# Patient Record
Sex: Female | Born: 1960
Health system: Southern US, Community
[De-identification: ages and names within clinical notes are randomized; demographics above are authoritative.]

## PROBLEM LIST (undated history)

## (undated) DIAGNOSIS — R7303 Prediabetes: Secondary | ICD-10-CM

## (undated) DIAGNOSIS — R51 Headache: Secondary | ICD-10-CM

## (undated) DIAGNOSIS — M5432 Sciatica, left side: Secondary | ICD-10-CM

## (undated) DIAGNOSIS — T7840XA Allergy, unspecified, initial encounter: Secondary | ICD-10-CM

## (undated) DIAGNOSIS — G709 Myoneural disorder, unspecified: Secondary | ICD-10-CM

## (undated) DIAGNOSIS — M7150 Other bursitis, not elsewhere classified, unspecified site: Secondary | ICD-10-CM

## (undated) DIAGNOSIS — M1990 Unspecified osteoarthritis, unspecified site: Secondary | ICD-10-CM

## (undated) DIAGNOSIS — L309 Dermatitis, unspecified: Secondary | ICD-10-CM

## (undated) DIAGNOSIS — I10 Essential (primary) hypertension: Secondary | ICD-10-CM

## (undated) HISTORY — DX: Allergy, unspecified, initial encounter: T78.40XA

## (undated) HISTORY — DX: Dermatitis, unspecified: L30.9

## (undated) HISTORY — DX: Other bursitis, not elsewhere classified, unspecified site: M71.50

## (undated) HISTORY — DX: Unspecified osteoarthritis, unspecified site: M19.90

## (undated) HISTORY — DX: Headache: R51

## (undated) HISTORY — DX: Myoneural disorder, unspecified: G70.9

## (undated) HISTORY — PX: TUBAL LIGATION: SHX77

## (undated) HISTORY — PX: ABDOMINAL HYSTERECTOMY: SHX81

## (undated) HISTORY — DX: Sciatica, left side: M54.32

---

## 2001-02-19 ENCOUNTER — Other Ambulatory Visit: Admission: RE | Admit: 2001-02-19 | Discharge: 2001-02-19 | Payer: Self-pay | Admitting: Obstetrics and Gynecology

## 2001-02-28 ENCOUNTER — Ambulatory Visit (HOSPITAL_COMMUNITY): Admission: RE | Admit: 2001-02-28 | Discharge: 2001-02-28 | Payer: Self-pay | Admitting: Obstetrics and Gynecology

## 2001-12-11 ENCOUNTER — Emergency Department (HOSPITAL_COMMUNITY): Admission: EM | Admit: 2001-12-11 | Discharge: 2001-12-11 | Payer: Self-pay | Admitting: Emergency Medicine

## 2001-12-11 ENCOUNTER — Encounter: Payer: Self-pay | Admitting: Emergency Medicine

## 2001-12-23 ENCOUNTER — Emergency Department (HOSPITAL_COMMUNITY): Admission: EM | Admit: 2001-12-23 | Discharge: 2001-12-23 | Payer: Self-pay | Admitting: Emergency Medicine

## 2002-10-02 ENCOUNTER — Encounter: Payer: Self-pay | Admitting: *Deleted

## 2002-10-02 ENCOUNTER — Emergency Department (HOSPITAL_COMMUNITY): Admission: EM | Admit: 2002-10-02 | Discharge: 2002-10-02 | Payer: Self-pay | Admitting: *Deleted

## 2003-11-09 ENCOUNTER — Emergency Department (HOSPITAL_COMMUNITY): Admission: EM | Admit: 2003-11-09 | Discharge: 2003-11-09 | Payer: Self-pay | Admitting: *Deleted

## 2004-02-12 ENCOUNTER — Emergency Department (HOSPITAL_COMMUNITY): Admission: EM | Admit: 2004-02-12 | Discharge: 2004-02-12 | Payer: Self-pay | Admitting: Emergency Medicine

## 2004-03-19 ENCOUNTER — Emergency Department (HOSPITAL_COMMUNITY): Admission: EM | Admit: 2004-03-19 | Discharge: 2004-03-19 | Payer: Self-pay | Admitting: Emergency Medicine

## 2005-01-01 ENCOUNTER — Emergency Department (HOSPITAL_COMMUNITY): Admission: EM | Admit: 2005-01-01 | Discharge: 2005-01-01 | Payer: Self-pay | Admitting: Emergency Medicine

## 2006-08-12 ENCOUNTER — Emergency Department (HOSPITAL_COMMUNITY): Admission: EM | Admit: 2006-08-12 | Discharge: 2006-08-12 | Payer: Self-pay | Admitting: Emergency Medicine

## 2006-12-14 ENCOUNTER — Ambulatory Visit (HOSPITAL_COMMUNITY): Admission: RE | Admit: 2006-12-14 | Discharge: 2006-12-14 | Payer: Self-pay | Admitting: Family Medicine

## 2008-11-03 ENCOUNTER — Ambulatory Visit: Payer: Self-pay | Admitting: Orthopedic Surgery

## 2008-11-03 DIAGNOSIS — M7150 Other bursitis, not elsewhere classified, unspecified site: Secondary | ICD-10-CM

## 2008-11-03 HISTORY — DX: Other bursitis, not elsewhere classified, unspecified site: M71.50

## 2008-11-05 ENCOUNTER — Encounter: Payer: Self-pay | Admitting: Orthopedic Surgery

## 2009-05-05 ENCOUNTER — Emergency Department (HOSPITAL_COMMUNITY): Admission: EM | Admit: 2009-05-05 | Discharge: 2009-05-05 | Payer: Self-pay | Admitting: Emergency Medicine

## 2010-06-20 ENCOUNTER — Emergency Department (HOSPITAL_COMMUNITY): Admission: EM | Admit: 2010-06-20 | Discharge: 2010-06-20 | Payer: Self-pay | Admitting: Emergency Medicine

## 2010-06-22 ENCOUNTER — Emergency Department (HOSPITAL_COMMUNITY)
Admission: EM | Admit: 2010-06-22 | Discharge: 2010-06-22 | Payer: Self-pay | Source: Home / Self Care | Admitting: Emergency Medicine

## 2010-09-10 ENCOUNTER — Emergency Department (HOSPITAL_COMMUNITY)
Admission: EM | Admit: 2010-09-10 | Discharge: 2010-09-11 | Disposition: A | Discharge status: Home / Self Care | Payer: BC Managed Care – PPO | Attending: Emergency Medicine | Admitting: Emergency Medicine

## 2010-09-10 DIAGNOSIS — R42 Dizziness and giddiness: Secondary | ICD-10-CM | POA: Insufficient documentation

## 2010-09-10 DIAGNOSIS — R059 Cough, unspecified: Secondary | ICD-10-CM | POA: Insufficient documentation

## 2010-09-10 DIAGNOSIS — R071 Chest pain on breathing: Secondary | ICD-10-CM | POA: Insufficient documentation

## 2010-09-10 DIAGNOSIS — R05 Cough: Secondary | ICD-10-CM | POA: Insufficient documentation

## 2010-09-11 ENCOUNTER — Emergency Department (HOSPITAL_COMMUNITY): Payer: BC Managed Care – PPO

## 2010-10-20 LAB — CBC
Hemoglobin: 14.3 g/dL (ref 12.0–15.0)
MCHC: 33.7 g/dL (ref 30.0–36.0)
MCV: 85.8 fL (ref 78.0–100.0)
RBC: 4.93 MIL/uL (ref 3.87–5.11)
WBC: 10.1 10*3/uL (ref 4.0–10.5)

## 2010-10-20 LAB — URINALYSIS, ROUTINE W REFLEX MICROSCOPIC
Bilirubin Urine: NEGATIVE
Glucose, UA: NEGATIVE mg/dL
Hgb urine dipstick: NEGATIVE
Ketones, ur: NEGATIVE mg/dL
Protein, ur: NEGATIVE mg/dL
Urobilinogen, UA: 0.2 mg/dL (ref 0.0–1.0)

## 2010-10-20 LAB — BASIC METABOLIC PANEL
CO2: 26 mEq/L (ref 19–32)
Calcium: 9.9 mg/dL (ref 8.4–10.5)
Glucose, Bld: 96 mg/dL (ref 70–99)
Potassium: 4 mEq/L (ref 3.5–5.1)
Sodium: 138 mEq/L (ref 135–145)

## 2010-10-20 LAB — DIFFERENTIAL
Eosinophils Absolute: 0.4 10*3/uL (ref 0.0–0.7)
Lymphs Abs: 4 10*3/uL (ref 0.7–4.0)
Monocytes Absolute: 0.8 10*3/uL (ref 0.1–1.0)
Monocytes Relative: 8 % (ref 3–12)
Neutro Abs: 4.9 10*3/uL (ref 1.7–7.7)
Neutrophils Relative %: 48 % (ref 43–77)

## 2010-10-20 LAB — URINE CULTURE: Colony Count: 75000

## 2010-10-20 LAB — GLUCOSE, CAPILLARY: Glucose-Capillary: 84 mg/dL (ref 70–99)

## 2010-10-20 LAB — URINE MICROSCOPIC-ADD ON

## 2010-10-24 ENCOUNTER — Other Ambulatory Visit (HOSPITAL_COMMUNITY): Payer: Self-pay | Admitting: Family Medicine

## 2010-10-24 DIAGNOSIS — Z139 Encounter for screening, unspecified: Secondary | ICD-10-CM

## 2010-12-02 NOTE — Op Note (Signed)
[facility]  Patient:    Robin Sherman, Robin Sherman. Visit Number: [id number] MRN: [id number]          Service Type: Attending:  Christin Bach, M.D. Dictated by:   Christin Bach, M.D. Proc. Date: 02/28/01                             Operative Report  PREOPERATIVE DIAGNOSES:  Elective sterilization.  POSTOPERATIVE DIAGNOSES:  Elective sterilization.  PROCEDURE:  Laparoscopic tubal sterilization with Falope rings.  SURGEON:  Christin Bach, M.D.  ASSISTANTAmie Critchley, CST  ANESTHESIA:  General.  COMPLICATIONS:  None.  ESTIMATED BLOOD LOSS: Minimal.  FINDINGS:  Nabothian cyst on the anterior lip of the cervix.  Normal pelvic anatomy.  The Nabothian cyst was sufficiently deforming the cervix and a sponge stick was used to manipulate the uterus rather than a Hulka tenaculum.  INDICATION:  Elective permanent sterilization.  DETAILS OF PROCEDURE:  The patient was taken to the operating room, prepped and draped for a combined abdominal and vaginal procedure, with a sponge stick for uterine manipulation. Bladder in-and-out catheterization. An infraumbilical, 1 cm vertical incision, as well as a transverse suprapubic 1 cm incision. Veress needle was used to introduce pneumoperitoneum through the umbilical incision with the pneumoperitoneum easily introduced under 10 mmHg of pressure. Introduction of the Veress needle was done, carefully elevating the abdominal wall and orienting the needle toward the pelvis.  The laparoscopic trocar was then carefully introduced into the abdomen using a similar technique, and the laparoscope was used to visualize normal pelvic anatomy with no evidence of bleeding or trauma. The suprapubic trocar was  introduced under direct visualization, and then attention was directed to the left fallopian tube, which was identified up to its fimbriated end, elevated and a mid-segment loop of the tube was drawn up into the Falope ring  applier, Marcaine 0.25% applied to the surface of the tube and the Falope ring applied, inspected, and found to be in satisfactory position. The opposite tube was then treated in a similar fashion. The mesosalpinx beneath the Falope ring on each side was then infiltrated with approximately 3 cc of Marcaine 0.25%, using a transabdominal approach with a 22-gauge spinal needle.  Then, the laparoscopic equipment was removed after instilling 200 cc of saline into the abdomen and deflating the abdomen. Subcuticular 4-0 Dexon was used to close the skin incisions and Steri-Strips was placed on the skin surface. Sponge and needle counts were correct. The patient tolerated the procedure well, was awakened, and went to the recovery room in good condition. Dictated by:   Christin Bach, M.D. Attending:  Christin Bach, M.D. DD:  06/02/01 TD:  06/02/01 Job: [id number]

## 2010-12-19 ENCOUNTER — Ambulatory Visit (HOSPITAL_COMMUNITY): Payer: BC Managed Care – PPO

## 2011-01-06 ENCOUNTER — Emergency Department (HOSPITAL_COMMUNITY)
Admission: EM | Admit: 2011-01-06 | Discharge: 2011-01-06 | Disposition: A | Discharge status: Home / Self Care | Payer: BC Managed Care – PPO | Attending: Emergency Medicine | Admitting: Emergency Medicine

## 2011-01-06 DIAGNOSIS — Z79899 Other long term (current) drug therapy: Secondary | ICD-10-CM | POA: Insufficient documentation

## 2011-01-06 DIAGNOSIS — R42 Dizziness and giddiness: Secondary | ICD-10-CM | POA: Insufficient documentation

## 2011-01-06 DIAGNOSIS — N390 Urinary tract infection, site not specified: Secondary | ICD-10-CM | POA: Insufficient documentation

## 2011-01-06 DIAGNOSIS — I10 Essential (primary) hypertension: Secondary | ICD-10-CM | POA: Insufficient documentation

## 2011-01-06 LAB — DIFFERENTIAL
Eosinophils Relative: 6 % — ABNORMAL HIGH (ref 0–5)
Lymphocytes Relative: 33 % (ref 12–46)
Lymphs Abs: 2.9 10*3/uL (ref 0.7–4.0)
Monocytes Absolute: 0.7 10*3/uL (ref 0.1–1.0)
Monocytes Relative: 8 % (ref 3–12)

## 2011-01-06 LAB — COMPREHENSIVE METABOLIC PANEL
Albumin: 3.9 g/dL (ref 3.5–5.2)
BUN: 10 mg/dL (ref 6–23)
Calcium: 10.1 mg/dL (ref 8.4–10.5)
Chloride: 102 mEq/L (ref 96–112)
Creatinine, Ser: 0.69 mg/dL (ref 0.50–1.10)
Total Bilirubin: 0.3 mg/dL (ref 0.3–1.2)
Total Protein: 8.5 g/dL — ABNORMAL HIGH (ref 6.0–8.3)

## 2011-01-06 LAB — URINALYSIS, ROUTINE W REFLEX MICROSCOPIC
Bilirubin Urine: NEGATIVE
Ketones, ur: NEGATIVE mg/dL
Leukocytes, UA: NEGATIVE
Nitrite: POSITIVE — AB
Urobilinogen, UA: 0.2 mg/dL (ref 0.0–1.0)

## 2011-01-06 LAB — CBC
HCT: 40.4 % (ref 36.0–46.0)
MCH: 28.2 pg (ref 26.0–34.0)
MCHC: 32.9 g/dL (ref 30.0–36.0)
MCV: 85.6 fL (ref 78.0–100.0)
RDW: 14.4 % (ref 11.5–15.5)

## 2011-01-06 LAB — LIPASE, BLOOD: Lipase: 55 U/L (ref 11–59)

## 2011-01-09 LAB — URINE CULTURE: Colony Count: >=100000

## 2011-01-11 ENCOUNTER — Emergency Department (HOSPITAL_COMMUNITY)
Admission: EM | Admit: 2011-01-11 | Discharge: 2011-01-11 | Disposition: A | Discharge status: Home / Self Care | Payer: BC Managed Care – PPO | Attending: Emergency Medicine | Admitting: Emergency Medicine

## 2011-01-11 ENCOUNTER — Emergency Department (HOSPITAL_COMMUNITY): Payer: BC Managed Care – PPO

## 2011-01-11 DIAGNOSIS — I10 Essential (primary) hypertension: Secondary | ICD-10-CM | POA: Insufficient documentation

## 2011-01-11 DIAGNOSIS — N390 Urinary tract infection, site not specified: Secondary | ICD-10-CM | POA: Insufficient documentation

## 2011-01-11 DIAGNOSIS — M549 Dorsalgia, unspecified: Secondary | ICD-10-CM | POA: Insufficient documentation

## 2011-01-11 LAB — URINALYSIS, ROUTINE W REFLEX MICROSCOPIC
Glucose, UA: NEGATIVE mg/dL
Ketones, ur: NEGATIVE mg/dL
Protein, ur: NEGATIVE mg/dL

## 2011-01-11 LAB — DIFFERENTIAL
Lymphocytes Relative: 35 % (ref 12–46)
Monocytes Absolute: 0.5 10*3/uL (ref 0.1–1.0)
Monocytes Relative: 6 % (ref 3–12)
Neutro Abs: 4.3 10*3/uL (ref 1.7–7.7)

## 2011-01-11 LAB — CBC
HCT: 39.8 % (ref 36.0–46.0)
Hemoglobin: 12.9 g/dL (ref 12.0–15.0)
MCH: 28.1 pg (ref 26.0–34.0)
MCHC: 32.4 g/dL (ref 30.0–36.0)
MCV: 86.7 fL (ref 78.0–100.0)

## 2011-01-11 LAB — BASIC METABOLIC PANEL
CO2: 23 mEq/L (ref 19–32)
Calcium: 8.9 mg/dL (ref 8.4–10.5)
Glucose, Bld: 109 mg/dL — ABNORMAL HIGH (ref 70–99)
Potassium: 4 mEq/L (ref 3.5–5.1)
Sodium: 137 mEq/L (ref 135–145)

## 2011-03-18 ENCOUNTER — Encounter: Payer: Self-pay | Admitting: *Deleted

## 2011-03-18 ENCOUNTER — Emergency Department (HOSPITAL_COMMUNITY)
Admission: EM | Admit: 2011-03-18 | Discharge: 2011-03-18 | Disposition: A | Discharge status: Home / Self Care | Payer: BC Managed Care – PPO | Attending: Emergency Medicine | Admitting: Emergency Medicine

## 2011-03-18 ENCOUNTER — Emergency Department (HOSPITAL_COMMUNITY): Payer: BC Managed Care – PPO

## 2011-03-18 DIAGNOSIS — I10 Essential (primary) hypertension: Secondary | ICD-10-CM | POA: Insufficient documentation

## 2011-03-18 DIAGNOSIS — F17200 Nicotine dependence, unspecified, uncomplicated: Secondary | ICD-10-CM | POA: Insufficient documentation

## 2011-03-18 DIAGNOSIS — H8110 Benign paroxysmal vertigo, unspecified ear: Secondary | ICD-10-CM

## 2011-03-18 HISTORY — DX: Essential (primary) hypertension: I10

## 2011-03-18 LAB — URINALYSIS, ROUTINE W REFLEX MICROSCOPIC
Bilirubin Urine: NEGATIVE
Glucose, UA: NEGATIVE mg/dL
Hgb urine dipstick: NEGATIVE
Specific Gravity, Urine: 1.025 (ref 1.005–1.030)
Urobilinogen, UA: 0.2 mg/dL (ref 0.0–1.0)

## 2011-03-18 LAB — CBC
HCT: 37.7 % (ref 36.0–46.0)
Hemoglobin: 12.2 g/dL (ref 12.0–15.0)
MCH: 27.8 pg (ref 26.0–34.0)
MCHC: 32.4 g/dL (ref 30.0–36.0)

## 2011-03-18 LAB — DIFFERENTIAL
Basophils Relative: 0 % (ref 0–1)
Lymphs Abs: 2.5 10*3/uL (ref 0.7–4.0)
Monocytes Absolute: 0.7 10*3/uL (ref 0.1–1.0)
Monocytes Relative: 8 % (ref 3–12)
Neutro Abs: 5.1 10*3/uL (ref 1.7–7.7)

## 2011-03-18 LAB — BASIC METABOLIC PANEL
BUN: 14 mg/dL (ref 6–23)
Chloride: 102 mEq/L (ref 96–112)
GFR calc Af Amer: >60 mL/min (ref >60)
Glucose, Bld: 104 mg/dL — ABNORMAL HIGH (ref 70–99)
Potassium: 4.4 mEq/L (ref 3.5–5.1)

## 2011-03-18 LAB — GLUCOSE, CAPILLARY: Glucose-Capillary: 111 mg/dL — ABNORMAL HIGH (ref 70–99)

## 2011-03-18 MED ORDER — MECLIZINE HCL 25 MG PO TABS: 25.0000 mg | ORAL_TABLET | Freq: Three times a day (TID) | ORAL | Status: AC | PRN

## 2011-03-18 MED ORDER — ALPRAZOLAM 0.25 MG PO TABS
0.2500 mg | ORAL_TABLET | Freq: Three times a day (TID) | ORAL | Status: AC | PRN
Start: 2011-03-18 — End: 2011-04-17

## 2011-03-18 MED ORDER — MECLIZINE HCL 12.5 MG PO TABS
25.0000 mg | ORAL_TABLET | Freq: Once | ORAL | Status: AC
  Administered 2011-03-18: 25 mg via ORAL
  Filled 2011-03-18: qty 1

## 2011-03-18 NOTE — ED Provider Notes (Signed)
History     CSN: [id number] Arrival date & time: 03/18/2011 11:17 AM  Chief Complaint  Patient presents with  . Blurred Vision  . Dizziness   Patient is a 50 y.o. female presenting with neurologic complaint. The history is provided by the patient.  Neurologic Problem The primary symptoms include dizziness and nausea. Primary symptoms do not include headaches, fever or vomiting. The symptoms began 12 to 24 hours ago. The symptoms are unchanged. Context: Triggered by positional changes and moving her head.  She describes the dizziness as a sensation of spinning. The dizziness began yesterday. The dizziness has been unchanged since its onset. Dizziness also occurs with nausea. Dizziness does not occur with blurred vision, tinnitus, hearing loss, vomiting, weakness or diaphoresis.  Additional symptoms include vertigo. Additional symptoms do not include neck stiffness, weakness, pain or tinnitus.    Past Medical History  Diagnosis Date  . Hypertension     History reviewed. No pertinent past surgical history.  No family history on file.  History  Substance Use Topics  . Smoking status: Current Everyday Smoker -- 0.5 packs/day  . Smokeless tobacco: Not on file  . Alcohol Use: No    OB History    Grav Para Term Preterm Abortions TAB SAB Ect Mult Living                  Review of Systems  Constitutional: Negative for fever and diaphoresis.  HENT: Negative for congestion, sore throat, rhinorrhea, neck pain, neck stiffness and tinnitus.   Eyes: Negative.  Negative for blurred vision.  Respiratory: Negative for chest tightness and shortness of breath.   Cardiovascular: Negative for chest pain.  Gastrointestinal: Positive for nausea. Negative for vomiting and abdominal pain.  Genitourinary: Negative.   Musculoskeletal: Negative for joint swelling and arthralgias.  Skin: Negative.  Negative for rash and wound.  Neurological: Positive for dizziness, vertigo and light-headedness.  Negative for weakness, numbness and headaches.  Hematological: Negative.   Psychiatric/Behavioral: Negative.     Physical Exam  BP 125/89  Pulse 60  Temp(Src) 98.4 F (36.9 C) (Oral)  Resp 20  Ht 5\' 4"  (1.626 m)  Wt 215 lb (97.523 kg)  BMI 36.90 kg/m2  SpO2 99%  LMP 01/30/2011  Physical Exam  Nursing note and vitals reviewed. Constitutional: She is oriented to person, place, and time. She appears well-developed and well-nourished.  HENT:  Head: Normocephalic and atraumatic.  Eyes: Conjunctivae are normal.  Neck: Normal range of motion.  Cardiovascular: Normal rate, regular rhythm, normal heart sounds and intact distal pulses.   Pulmonary/Chest: Effort normal and breath sounds normal. She has no wheezes.  Abdominal: Soft. Bowel sounds are normal. There is no tenderness.  Musculoskeletal: Normal range of motion.  Neurological: She is alert and oriented to person, place, and time. No cranial nerve deficit. She displays a negative Romberg sign. Coordination normal.       Spinning worsened with positional changes and with head movements while supine.  Skin: Skin is warm and dry.  Psychiatric: She has a normal mood and affect.    ED Course  Procedures  MDM Normal labs and head CT.  Patient did obtain moderate improvement of sx with meclizine medication.      Candis Musa, PA 03/18/11 1655

## 2011-03-18 NOTE — ED Notes (Signed)
Pt c/o blurred vision and dizziness. Pt states this began last pm while at home.

## 2011-03-21 NOTE — ED Provider Notes (Signed)
Medical screening examination/treatment/procedure(s) were performed by non-physician practitioner and as supervising physician I was immediately available for consultation/collaboration.   Benny Lennert, MD 03/21/11 1315

## 2011-07-27 ENCOUNTER — Emergency Department (HOSPITAL_COMMUNITY)
Admission: EM | Admit: 2011-07-27 | Discharge: 2011-07-27 | Disposition: A | Discharge status: Home / Self Care | Payer: BC Managed Care – PPO | Attending: Emergency Medicine | Admitting: Emergency Medicine

## 2011-07-27 ENCOUNTER — Encounter (HOSPITAL_COMMUNITY): Payer: Self-pay | Admitting: Emergency Medicine

## 2011-07-27 ENCOUNTER — Emergency Department (HOSPITAL_COMMUNITY): Payer: BC Managed Care – PPO

## 2011-07-27 DIAGNOSIS — R6889 Other general symptoms and signs: Secondary | ICD-10-CM | POA: Insufficient documentation

## 2011-07-27 DIAGNOSIS — J3489 Other specified disorders of nose and nasal sinuses: Secondary | ICD-10-CM | POA: Insufficient documentation

## 2011-07-27 DIAGNOSIS — J45909 Unspecified asthma, uncomplicated: Secondary | ICD-10-CM | POA: Insufficient documentation

## 2011-07-27 DIAGNOSIS — R059 Cough, unspecified: Secondary | ICD-10-CM | POA: Insufficient documentation

## 2011-07-27 DIAGNOSIS — F17200 Nicotine dependence, unspecified, uncomplicated: Secondary | ICD-10-CM | POA: Insufficient documentation

## 2011-07-27 DIAGNOSIS — IMO0001 Reserved for inherently not codable concepts without codable children: Secondary | ICD-10-CM | POA: Insufficient documentation

## 2011-07-27 DIAGNOSIS — I10 Essential (primary) hypertension: Secondary | ICD-10-CM | POA: Insufficient documentation

## 2011-07-27 DIAGNOSIS — R05 Cough: Secondary | ICD-10-CM | POA: Insufficient documentation

## 2011-07-27 MED ORDER — ALBUTEROL SULFATE HFA 108 (90 BASE) MCG/ACT IN AERS
2.0000 | INHALATION_SPRAY | Freq: Once | RESPIRATORY_TRACT | Status: AC
  Administered 2011-07-27: 2 via RESPIRATORY_TRACT
  Filled 2011-07-27: qty 6.7

## 2011-07-27 MED ORDER — AZITHROMYCIN 250 MG PO TABS: ORAL_TABLET | ORAL | Status: DC

## 2011-07-27 MED ORDER — HYDROCOD POLST-CHLORPHEN POLST 10-8 MG/5ML PO LQCR: 5.0000 mL | Freq: Two times a day (BID) | ORAL | Status: DC | PRN

## 2011-07-27 NOTE — ED Notes (Signed)
MD at bedside. 

## 2011-07-27 NOTE — ED Provider Notes (Signed)
History     CSN: [id number]  Arrival date & time 07/27/11  1355   First MD Initiated Contact with Patient 07/27/11 1636      Chief Complaint  Patient presents with  . Cough  . Nasal Congestion  . Generalized Body Aches    (Consider location/radiation/quality/duration/timing/severity/associated sxs/prior treatment) Patient is a 51 y.o. female presenting with cough. The history is provided by the patient. No language interpreter was used.  Cough This is a new problem. The current episode started 2 days ago. The problem occurs constantly. The problem has not changed since onset.The cough is productive of sputum. There has been no fever. Associated symptoms include rhinorrhea, myalgias and wheezing. Pertinent negatives include no chest pain, no chills, no ear pain and no sore throat. She has tried nothing for the symptoms. The treatment provided no relief. She is a smoker. Her past medical history is significant for asthma. Her past medical history does not include bronchitis or pneumonia.    Past Medical History  Diagnosis Date  . Hypertension   . Asthma     History reviewed. No pertinent past surgical history.  History reviewed. No pertinent family history.  History  Substance Use Topics  . Smoking status: Current Everyday Smoker -- 0.5 packs/day  . Smokeless tobacco: Not on file  . Alcohol Use: No    OB History    Grav Para Term Preterm Abortions TAB SAB Ect Mult Living                  Review of Systems  Constitutional: Negative for fever, chills, activity change and appetite change.  HENT: Positive for congestion, rhinorrhea and sneezing. Negative for ear pain, sore throat, facial swelling, neck pain and neck stiffness.   Respiratory: Positive for cough and wheezing. Negative for chest tightness.   Cardiovascular: Negative for chest pain.  Gastrointestinal: Negative for vomiting and abdominal pain.  Genitourinary: Negative for dysuria and difficulty urinating.    Musculoskeletal: Positive for myalgias. Negative for arthralgias.  Neurological: Negative for weakness and numbness.  Hematological: Negative for adenopathy.  All other systems reviewed and are negative.    Allergies  Codeine and Tylenol  Home Medications   Current Outpatient Rx  Name Route Sig Dispense Refill  . HALOBETASOL PROPIONATE 0.05 % EX CREA Topical Apply 1 application topically 2 (two) times daily as needed. For rash     . HYDROXYZINE PAMOATE 25 MG PO CAPS Oral Take 25 mg by mouth 3 (three) times daily as needed. For itching     . LISINOPRIL-HYDROCHLOROTHIAZIDE 20-12.5 MG PO TABS Oral Take 1 tablet by mouth daily.        BP 130/86  Pulse 96  Temp 98.8 F (37.1 C)  Resp 20  Ht 5\' 3"  (1.6 m)  Wt 200 lb (90.719 kg)  BMI 35.43 kg/m2  SpO2 97%  Physical Exam  Nursing note and vitals reviewed. Constitutional: She is oriented to person, place, and time. She appears well-developed and well-nourished. No distress.  HENT:  Head: Normocephalic and atraumatic.  Mouth/Throat: Oropharynx is clear and moist.  Neck: Normal range of motion.  Cardiovascular: Normal rate, regular rhythm and normal heart sounds.   Pulmonary/Chest: Effort normal. No respiratory distress. She has wheezes. She has no rales. She exhibits no tenderness.  Musculoskeletal: She exhibits no edema and no tenderness.  Lymphadenopathy:    She has no cervical adenopathy.  Neurological: She is alert and oriented to person, place, and time.  Skin: Skin is warm and  dry.    ED Course  Procedures (including critical care time)  Labs Reviewed - No data to display Dg Chest 2 View  07/27/2011  *RADIOLOGY REPORT*  Clinical Data: Cough.  CHEST - 2 VIEW  Comparison: Chest x-ray 09/11/2010.  Findings: The cardiac silhouette, mediastinal and hilar contours are within normal limits and stable.  The lungs are clear of infiltrate or effusion.  Peribronchial thickening could be related to smoking for bronchitis.  No  pleural effusion.  The bony thorax is intact.  IMPRESSION: Bronchitic lung changes may be related to smoking or bronchitis. No infiltrates.  Original Report Authenticated By: P. Loralie Champagne, M.D.        MDM    Patient is alert, NAD.  Few expir and inspir wheezes.  No rales.  No hypoxia or tachypnea.  Patient appears stable for d/c. Vitals stable. Agrees to follow-up with her PMD if needed or return here  Patient / Family / Caregiver understand and agree with initial ED impression and plan with expectations set for ED visit.   Pt stable in ED with no significant deterioration in condition.       Braxdon Gappa L. [place], [place] 07/29/11 [phone]

## 2011-07-27 NOTE — ED Notes (Signed)
Pt c/o cough/congestion/body aches x 2 days.  

## 2011-07-30 NOTE — ED Provider Notes (Signed)
Medical screening examination/treatment/procedure(s) were performed by non-physician practitioner and as supervising physician I was immediately available for consultation/collaboration.   Joya Gaskins, MD 07/30/11 [phone]

## 2011-08-24 ENCOUNTER — Emergency Department (HOSPITAL_COMMUNITY)
Admission: EM | Admit: 2011-08-24 | Discharge: 2011-08-24 | Disposition: A | Discharge status: Home / Self Care | Payer: BC Managed Care – PPO | Attending: Emergency Medicine | Admitting: Emergency Medicine

## 2011-08-24 ENCOUNTER — Encounter (HOSPITAL_COMMUNITY): Payer: Self-pay

## 2011-08-24 DIAGNOSIS — J111 Influenza due to unidentified influenza virus with other respiratory manifestations: Secondary | ICD-10-CM | POA: Insufficient documentation

## 2011-08-24 DIAGNOSIS — I10 Essential (primary) hypertension: Secondary | ICD-10-CM | POA: Insufficient documentation

## 2011-08-24 DIAGNOSIS — F17200 Nicotine dependence, unspecified, uncomplicated: Secondary | ICD-10-CM | POA: Insufficient documentation

## 2011-08-24 DIAGNOSIS — J45909 Unspecified asthma, uncomplicated: Secondary | ICD-10-CM | POA: Insufficient documentation

## 2011-08-24 MED ORDER — PSEUDOEPHEDRINE HCL 60 MG PO TABS: ORAL_TABLET | ORAL | Status: DC

## 2011-08-24 MED ORDER — HYDROCODONE-IBUPROFEN 7.5-200 MG PO TABS: 1.0000 | ORAL_TABLET | Freq: Four times a day (QID) | ORAL | Status: AC | PRN

## 2011-08-24 NOTE — ED Provider Notes (Signed)
History     CSN: [id number]  Arrival date & time 08/24/11  1205   First MD Initiated Contact with Patient 08/24/11 1248      Chief Complaint  Patient presents with  . Influenza    (Consider location/radiation/quality/duration/timing/severity/associated sxs/prior treatment) Patient is a 51 y.o. female presenting with flu symptoms. The history is provided by the patient.  Influenza This is a new problem. The current episode started in the past 7 days. The problem occurs daily. The problem has been gradually worsening. Associated symptoms include abdominal pain, arthralgias, chills, a fever, headaches and myalgias. Pertinent negatives include no chest pain, coughing or neck pain. The symptoms are aggravated by nothing. She has tried nothing for the symptoms. The treatment provided no relief.    Past Medical History  Diagnosis Date  . Hypertension   . Asthma     History reviewed. No pertinent past surgical history.  No family history on file.  History  Substance Use Topics  . Smoking status: Current Everyday Smoker -- 0.5 packs/day  . Smokeless tobacco: Not on file  . Alcohol Use: No    OB History    Grav Para Term Preterm Abortions TAB SAB Ect Mult Living                  Review of Systems  Constitutional: Positive for fever and chills. Negative for activity change.       All ROS Neg except as noted in HPI  HENT: Negative for nosebleeds and neck pain.   Eyes: Negative for photophobia and discharge.  Respiratory: Negative for cough, shortness of breath and wheezing.   Cardiovascular: Negative for chest pain and palpitations.  Gastrointestinal: Positive for abdominal pain. Negative for blood in stool.  Genitourinary: Negative for dysuria, frequency and hematuria.  Musculoskeletal: Positive for myalgias and arthralgias. Negative for back pain.  Skin: Negative.   Neurological: Positive for headaches. Negative for dizziness, seizures and speech difficulty.    Psychiatric/Behavioral: Negative for hallucinations and confusion.    Allergies  Codeine and Tylenol  Home Medications   Current Outpatient Rx  Name Route Sig Dispense Refill  . AZITHROMYCIN 250 MG PO TABS  Take two tablets on day one, then one tab qd days 2-5 6 tablet 0  . HYDROCOD POLST-CPM POLST ER 10-8 MG/5ML PO LQCR Oral Take 5 mLs by mouth every 12 (twelve) hours as needed. 120 mL 0  . HALOBETASOL PROPIONATE 0.05 % EX CREA Topical Apply 1 application topically 2 (two) times daily as needed. For rash     . HYDROCODONE-IBUPROFEN 7.5-200 MG PO TABS Oral Take 1 tablet by mouth every 6 (six) hours as needed for pain. 20 tablet 0  . HYDROXYZINE PAMOATE 25 MG PO CAPS Oral Take 25 mg by mouth 3 (three) times daily as needed. For itching     . LISINOPRIL-HYDROCHLOROTHIAZIDE 20-12.5 MG PO TABS Oral Take 1 tablet by mouth daily.      Marland Kitchen PSEUDOEPHEDRINE HCL 60 MG PO TABS  1 po tid for congestion. 21 tablet 0    BP 134/98  Pulse 106  Temp(Src) 98.5 F (36.9 C) (Oral)  Resp 20  Ht 5\' 4"  (1.626 m)  Wt 210 lb (95.255 kg)  BMI 36.05 kg/m2  SpO2 100%  LMP 08/17/2011  Physical Exam  Nursing note and vitals reviewed. Constitutional: She is oriented to person, place, and time. She appears well-developed and well-nourished.  Non-toxic appearance.  HENT:  Head: Normocephalic.  Right Ear: Tympanic membrane and external ear  normal.  Left Ear: Tympanic membrane and external ear normal.       Nasal congestion present. Minimal increased redness of the posterior pharynx present. Uvula is in the midline.  Eyes: EOM and lids are normal. Pupils are equal, round, and reactive to light.  Neck: Normal range of motion. Neck supple. Carotid bruit is not present.  Cardiovascular: Normal rate, regular rhythm, normal heart sounds, intact distal pulses and normal pulses.   Pulmonary/Chest: Breath sounds normal. No respiratory distress.  Abdominal: Soft. Bowel sounds are normal. There is no tenderness. There  is no guarding.       Mild upper abd soreness.  Musculoskeletal: Normal range of motion.       Mild to moderate pain, and soreness, to the  lower back with attempted ROM.  Lymphadenopathy:       Head (right side): No submandibular adenopathy present.       Head (left side): No submandibular adenopathy present.    She has no cervical adenopathy.  Neurological: She is alert and oriented to person, place, and time. She has normal strength. No cranial nerve deficit or sensory deficit.  Skin: Skin is warm and dry.  Psychiatric: She has a normal mood and affect. Her speech is normal.    ED Course  Procedures (including critical care time) Pulse oximetry 100% on room air. Within normal limits by my interpretation. Labs Reviewed - No data to display No results found.   1. Influenza       MDM  I have reviewed nursing notes, vital signs, and all appropriate lab and imaging results for this patient.  Rx for Sudafed and vicoprofen given. Pt to f/u with primary MD if not improving.      Kathie Dike, [place] 08/24/11 1301

## 2011-08-24 NOTE — ED Notes (Signed)
Complain of body aches and ha

## 2011-08-24 NOTE — ED Provider Notes (Signed)
Medical screening examination/treatment/procedure(s) were performed by non-physician practitioner and as supervising physician I was immediately available for consultation/collaboration.   Kourtney Montesinos W Lucius Wise, MD 08/24/11 1527 

## 2011-09-21 ENCOUNTER — Ambulatory Visit (INDEPENDENT_AMBULATORY_CARE_PROVIDER_SITE_OTHER): Payer: BC Managed Care – PPO | Admitting: Family Medicine

## 2011-09-21 ENCOUNTER — Encounter: Payer: Self-pay | Admitting: Family Medicine

## 2011-09-21 VITALS — BP 130/88 | HR 92 | Resp 16 | Ht 64.0 in | Wt 215.0 lb

## 2011-09-21 DIAGNOSIS — J4520 Mild intermittent asthma, uncomplicated: Secondary | ICD-10-CM

## 2011-09-21 DIAGNOSIS — F17200 Nicotine dependence, unspecified, uncomplicated: Secondary | ICD-10-CM

## 2011-09-21 DIAGNOSIS — E66812 Obesity, class 2: Secondary | ICD-10-CM | POA: Insufficient documentation

## 2011-09-21 DIAGNOSIS — M25511 Pain in right shoulder: Secondary | ICD-10-CM

## 2011-09-21 DIAGNOSIS — R7309 Other abnormal glucose: Secondary | ICD-10-CM

## 2011-09-21 DIAGNOSIS — J45909 Unspecified asthma, uncomplicated: Secondary | ICD-10-CM

## 2011-09-21 DIAGNOSIS — Z720 Tobacco use: Secondary | ICD-10-CM

## 2011-09-21 DIAGNOSIS — I10 Essential (primary) hypertension: Secondary | ICD-10-CM

## 2011-09-21 DIAGNOSIS — M25519 Pain in unspecified shoulder: Secondary | ICD-10-CM

## 2011-09-21 DIAGNOSIS — E669 Obesity, unspecified: Secondary | ICD-10-CM

## 2011-09-21 MED ORDER — NAPROXEN 500 MG PO TABS: 500.0000 mg | ORAL_TABLET | Freq: Two times a day (BID) | ORAL | Status: DC

## 2011-09-21 MED ORDER — HYDROCHLOROTHIAZIDE 25 MG PO TABS: 25.0000 mg | ORAL_TABLET | Freq: Every day | ORAL | Status: DC
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
## 2011-09-21 NOTE — Assessment & Plan Note (Signed)
D/C ACE secondary to cough, continue HCTZ at 25mg 

## 2011-09-21 NOTE — Assessment & Plan Note (Signed)
Discussed importance of exercise and weight loss

## 2011-09-21 NOTE — Assessment & Plan Note (Signed)
Rotator cuff appears in tact although she has some equivocal impingement signs. Will place on NSAIDS, if not improved will refer to ortho

## 2011-09-21 NOTE — Patient Instructions (Addendum)
Schedule a PAP Smear/CPE  for 4 weeks Get your blood work done - fasting We will review your labs at that visit  I recommend Calcium (1200mg ) and Vit D ( 800IU)  Schedule for Mammogram I recommend that you quit smoking

## 2011-09-21 NOTE — Assessment & Plan Note (Signed)
Obtain A1C. 

## 2011-09-21 NOTE — Assessment & Plan Note (Signed)
Stable

## 2011-09-25 ENCOUNTER — Telehealth: Payer: Self-pay

## 2011-09-25 NOTE — Telephone Encounter (Signed)
She was already taking HCTZ it was in her other blood pressure pill, the new medication is naprosyn for her shoulder. Have her stop taking this and take her Vistaril ( given by her dermatologist) or she can take benadryl. If the blisters do not improve have her come in tomorrow in the morning to have this checked. If she has blistering in her mouth or around her face, of difficulty breathing she needs to go to the ER now!

## 2011-09-25 NOTE — Telephone Encounter (Signed)
Spoke with pt in office and she is aware. The blisters are just on her hands as of now. Pt aware of instructions

## 2011-09-27 LAB — CBC
HCT: 42.2 % (ref 36.0–46.0)
Hemoglobin: 13 g/dL (ref 12.0–15.0)
MCV: 87.4 fL (ref 78.0–100.0)
RBC: 4.83 MIL/uL (ref 3.87–5.11)
WBC: 7.8 10*3/uL (ref 4.0–10.5)

## 2011-09-27 LAB — LIPID PANEL
HDL: 44 mg/dL (ref >39)
Triglycerides: 72 mg/dL (ref <150)

## 2011-09-27 LAB — COMPREHENSIVE METABOLIC PANEL
Albumin: 4.4 g/dL (ref 3.5–5.2)
BUN: 11 mg/dL (ref 6–23)
CO2: 28 mEq/L (ref 19–32)
Calcium: 9.4 mg/dL (ref 8.4–10.5)
Chloride: 102 mEq/L (ref 96–112)
Creat: 0.77 mg/dL (ref 0.50–1.10)
Glucose, Bld: 94 mg/dL (ref 70–99)

## 2011-09-27 LAB — TSH: TSH: 3.727 u[IU]/mL (ref 0.350–4.500)

## 2011-10-19 ENCOUNTER — Other Ambulatory Visit (HOSPITAL_COMMUNITY)
Admission: RE | Admit: 2011-10-19 | Discharge: 2011-10-19 | Disposition: A | Discharge status: Home / Self Care | Payer: BC Managed Care – PPO | Source: Ambulatory Visit | Attending: Family Medicine | Admitting: Family Medicine

## 2011-10-19 DIAGNOSIS — Z01419 Encounter for gynecological examination (general) (routine) without abnormal findings: Secondary | ICD-10-CM | POA: Insufficient documentation

## 2011-10-20 ENCOUNTER — Encounter: Payer: Self-pay | Admitting: Family Medicine

## 2011-10-20 ENCOUNTER — Ambulatory Visit (INDEPENDENT_AMBULATORY_CARE_PROVIDER_SITE_OTHER): Payer: BC Managed Care – PPO | Admitting: Family Medicine

## 2011-10-20 VITALS — BP 130/84 | HR 78 | Resp 15 | Ht 64.0 in | Wt 211.0 lb

## 2011-10-20 DIAGNOSIS — I10 Essential (primary) hypertension: Secondary | ICD-10-CM

## 2011-10-20 DIAGNOSIS — N760 Acute vaginitis: Secondary | ICD-10-CM

## 2011-10-20 DIAGNOSIS — F17200 Nicotine dependence, unspecified, uncomplicated: Secondary | ICD-10-CM

## 2011-10-20 DIAGNOSIS — Z124 Encounter for screening for malignant neoplasm of cervix: Secondary | ICD-10-CM

## 2011-10-20 DIAGNOSIS — Z1211 Encounter for screening for malignant neoplasm of colon: Secondary | ICD-10-CM

## 2011-10-20 DIAGNOSIS — Z23 Encounter for immunization: Secondary | ICD-10-CM

## 2011-10-20 DIAGNOSIS — R21 Rash and other nonspecific skin eruption: Secondary | ICD-10-CM

## 2011-10-20 DIAGNOSIS — Z720 Tobacco use: Secondary | ICD-10-CM

## 2011-10-20 DIAGNOSIS — R7309 Other abnormal glucose: Secondary | ICD-10-CM

## 2011-10-20 DIAGNOSIS — Z01419 Encounter for gynecological examination (general) (routine) without abnormal findings: Secondary | ICD-10-CM

## 2011-10-20 LAB — POC HEMOCCULT BLD/STL (OFFICE/1-CARD/DIAGNOSTIC): Fecal Occult Blood, POC: NEGATIVE

## 2011-10-20 MED ORDER — AMLODIPINE BESYLATE 10 MG PO TABS: 10.0000 mg | ORAL_TABLET | Freq: Every day | ORAL | Status: DC

## 2011-10-20 MED ORDER — HALOBETASOL PROPIONATE 0.05 % EX CREA: TOPICAL_CREAM | Freq: Two times a day (BID) | CUTANEOUS | Status: DC

## 2011-10-20 MED ORDER — HYDROXYZINE HCL 25 MG PO TABS: 25.0000 mg | ORAL_TABLET | Freq: Three times a day (TID) | ORAL | Status: DC | PRN

## 2011-10-20 NOTE — Assessment & Plan Note (Signed)
Mild glucose intolerance, would check A1C once a year

## 2011-10-20 NOTE — Assessment & Plan Note (Signed)
Continue to encourage cessation. 

## 2011-10-20 NOTE — Patient Instructions (Signed)
I recommend calcium (1200mg ) and Vit D (800IU) daily New blood pressure medication norvasc We will send a letter with PAP Smear results Your are at risk for diabetes mellitus, work on weight loss, decreasing sugars and carbs, add fresh fruits and veggies with each meal Exercise 30 minutes most days during the week Work on the smoking Mammogram to be scheduled F/U 6 weeks for blood pressure

## 2011-10-20 NOTE — Progress Notes (Signed)
  Subjective:    Patient ID: Robin Sherman, female    DOB: 1960/12/25, 51 y.o.   MRN: [id number]  HPI  Patient here for GYN exam. Labs reviewed with patient. Mammogram to be set up. Patient will like to defer until her next visit for colonoscopy TDAP due  Rash- she states she began to have worsening eczema with some pustular lesions on her hands after she started on the Naprosyn for her joints. She denies any new change in lotion or so. She was already on HCTZ however it wasn't combination pill with lisinopril and the lisinopril part was discontinued. The lesions now are dried over however they itch a lot. She has an appointment with her dermatologist next week.  Hypertension- she feels that hydrochlorothiazide by itself makes her very fatigued and tired typically she waits until she comes home before she takes it because it makes her go to sleep.   Review of Systems   GEN- + fatigue, fever, weight loss,weakness, recent illness HEENT- denies eye drainage, change in vision, nasal discharge, CVS- denies chest pain, palpitations RESP- denies SOB, cough, wheeze ABD- denies N/V, change in stools, abd pain GU- denies dysuria, hematuria, dribbling, incontinence MSK- denies joint pain, muscle aches, injury Neuro- denies headache, dizziness, syncope, seizure activity      Objective:   Physical Exam GEN- NAD, alert and oriented, Breast- normal symmetry, no nipple inversion,no nipple drainage, no nodules or lumps felt Nodes- no axillary nodes GU- normal external genitalia, vaginal mucosa pink and moist, cervix visualized no growth, mild bleeding form os after exam, white non odorous discharge, no CMT, no ovarian masses, uterus normal size Ext- no edema Skin- erythematous raised dry lesions on bilateral hands over fingers/knuckles, dry scabs seen on few lesions in center no pus, non tender, eczematous rash noted on hands, arms/elbow regions Rectal- FOBT neg, no hemorroids noted, normal  tone      Assessment & Plan:

## 2011-10-20 NOTE — Assessment & Plan Note (Signed)
PAP Smear done, TDAP given Mammogram to be set up

## 2011-10-20 NOTE — Assessment & Plan Note (Signed)
Unclear if this was a result of naprosyn use, she had some pustular lesions which have now resolved, this does look like severe ezcematous rash which is irritated, but she has some scabs were the pustules were. At this time defer to derm, refilled her creams and vistaril

## 2011-10-20 NOTE — Assessment & Plan Note (Signed)
D/c HCTZ secondary to fatigue, trial of norvasc

## 2011-10-21 LAB — GC/CHLAMYDIA PROBE AMP, GENITAL
Chlamydia, DNA Probe: NEGATIVE
GC Probe Amp, Genital: NEGATIVE

## 2011-10-21 LAB — WET PREP BY MOLECULAR PROBE
Candida species: NEGATIVE
Trichomonas vaginosis: NEGATIVE

## 2011-11-27 ENCOUNTER — Encounter: Payer: Self-pay | Admitting: Family Medicine

## 2011-11-27 ENCOUNTER — Ambulatory Visit (INDEPENDENT_AMBULATORY_CARE_PROVIDER_SITE_OTHER): Payer: BC Managed Care – PPO | Admitting: Family Medicine

## 2011-11-27 VITALS — BP 122/82 | HR 81 | Resp 16 | Ht 64.0 in | Wt 218.8 lb

## 2011-11-27 DIAGNOSIS — I10 Essential (primary) hypertension: Secondary | ICD-10-CM

## 2011-11-27 DIAGNOSIS — R21 Rash and other nonspecific skin eruption: Secondary | ICD-10-CM

## 2011-11-27 MED ORDER — HALOBETASOL PROPIONATE 0.05 % EX CREA: TOPICAL_CREAM | Freq: Two times a day (BID) | CUTANEOUS | Status: DC

## 2011-11-27 MED ORDER — HYDROXYZINE HCL 25 MG PO TABS: 25.0000 mg | ORAL_TABLET | Freq: Three times a day (TID) | ORAL | Status: DC | PRN

## 2011-11-27 NOTE — Assessment & Plan Note (Signed)
At goal, continue norvasc, no symptoms of fatigue or leg edema

## 2011-11-27 NOTE — Assessment & Plan Note (Signed)
She has a hand dermatitis, her chronic creams and vistaril refilled, will send for note.  She is to check at work regarding the type of gloves they use

## 2011-11-27 NOTE — Progress Notes (Signed)
  Subjective:    Patient ID: Robin Sherman, female    DOB: 1961-07-15, 51 y.o.   MRN: [id number]  HPI Pt here to f/u blood pressure, also would like me to fill her dermatology meds as she does not want to go back to previous Dermatologist. She is concerned her gloves at work cause her skin to break out more. No side effects with norvasc.   Review of Systems  GEN- denies fatigue, fever, weight loss,weakness, recent illness HEENT- denies eye drainage, change in vision, nasal discharge, CVS- denies chest pain, palpitations,leg edema RESP- denies SOB, cough, wheeze Neuro- denies headache, dizziness, syncope, seizure activity      Objective:   Physical Exam GEN- NAD, alert and oriented x3 CVS- RRR, no murmur RESP-CTAB EXT- No edema Pulses- Radial, DP- 2+        Assessment & Plan:

## 2011-11-27 NOTE — Patient Instructions (Signed)
Continue your blood pressure medication  I have refilled the creams Ask work about any other gloves they can order for you F/U 6 months for blood pressure

## 2012-03-01 ENCOUNTER — Ambulatory Visit (INDEPENDENT_AMBULATORY_CARE_PROVIDER_SITE_OTHER): Payer: BC Managed Care – PPO | Admitting: Family Medicine

## 2012-03-01 ENCOUNTER — Encounter: Payer: Self-pay | Admitting: Family Medicine

## 2012-03-01 VITALS — BP 138/86 | HR 81 | Resp 18 | Ht 64.0 in | Wt 224.0 lb

## 2012-03-01 DIAGNOSIS — I10 Essential (primary) hypertension: Secondary | ICD-10-CM

## 2012-03-01 DIAGNOSIS — L309 Dermatitis, unspecified: Secondary | ICD-10-CM

## 2012-03-01 DIAGNOSIS — L259 Unspecified contact dermatitis, unspecified cause: Secondary | ICD-10-CM

## 2012-03-01 MED ORDER — HYDROXYZINE PAMOATE 50 MG PO CAPS: 50.0000 mg | ORAL_CAPSULE | Freq: Three times a day (TID) | ORAL | Status: DC | PRN

## 2012-03-01 NOTE — Progress Notes (Signed)
  Subjective:    Patient ID: Robin Sherman, female    DOB: 02/22/1961, 51 y.o.   MRN: [id number]  HPI Patient presents with recurrent dermatitis. She's had a rash on her arms and hands which does not completely clear for multiple years. She's been seen by dermatology in the past. At last visit she was restarted on her halobetasol cream however this just makes the area wall at times. She's been using Vistaril but has take 2 of the time because of itching. She tells me today she's not been taking her blood pressure pill because she lost the bottle.   Review of Systems - per above    GEN- denies fatigue, fever, weight loss,weakness, recent illness MSK- denies joint pain, muscle aches, injury      Objective:   Physical Exam GEN-NAD,alert and oriented x 3  Skin- erythematous raised dry lesions on bilateral hands over fingers/knuckles, dry scabs seen on few lesions in center no pus, non tender, eczematous rash noted on hands, arms/elbow regions       Assessment & Plan:

## 2012-03-01 NOTE — Patient Instructions (Signed)
Vistaril changed to 50mg  capsule Continue current cream make sure to moisturize skin Continue BP medication Keep previous f/u appt

## 2012-03-02 ENCOUNTER — Encounter: Payer: Self-pay | Admitting: Family Medicine

## 2012-03-02 DIAGNOSIS — L309 Dermatitis, unspecified: Secondary | ICD-10-CM | POA: Insufficient documentation

## 2012-03-02 NOTE — Assessment & Plan Note (Signed)
No change to steroid cream, refer back to derm, may need biopsy done Vistaril increased for itch

## 2012-03-02 NOTE — Assessment & Plan Note (Signed)
BP okay off meds, will trend

## 2012-05-13 ENCOUNTER — Other Ambulatory Visit: Payer: Self-pay | Admitting: Family Medicine

## 2012-05-13 DIAGNOSIS — L259 Unspecified contact dermatitis, unspecified cause: Secondary | ICD-10-CM

## 2012-06-03 ENCOUNTER — Ambulatory Visit: Payer: BC Managed Care – PPO | Admitting: Family Medicine

## 2012-06-10 ENCOUNTER — Encounter: Payer: Self-pay | Admitting: Family Medicine

## 2012-06-10 ENCOUNTER — Ambulatory Visit: Payer: BC Managed Care – PPO | Admitting: Family Medicine

## 2012-07-25 ENCOUNTER — Telehealth: Payer: Self-pay | Admitting: Family Medicine

## 2012-07-25 ENCOUNTER — Other Ambulatory Visit: Payer: Self-pay

## 2012-07-25 MED ORDER — HALOBETASOL PROPIONATE 0.05 % EX CREA: TOPICAL_CREAM | Freq: Two times a day (BID) | CUTANEOUS | Status: DC

## 2012-07-25 NOTE — Telephone Encounter (Signed)
Patient walked in office to see if she could get an rx for ibuprofen 800mg . She had been taking Naproxen for her right shoulder and back pain but it causes her to break out. She works in the [employer] and has to lift/pull patients daily  [phone] (call after 2pm)

## 2012-07-25 NOTE — Telephone Encounter (Signed)
Ibuprofen likely to make her break out also since in the same class of drugs. I suggest exra strength tylenol, 500mg  one up to twice daily for the next 4 to 5 days

## 2012-08-02 NOTE — Telephone Encounter (Signed)
Noted  

## 2012-08-16 ENCOUNTER — Ambulatory Visit (INDEPENDENT_AMBULATORY_CARE_PROVIDER_SITE_OTHER): Payer: BC Managed Care – PPO | Admitting: Family Medicine

## 2012-08-16 ENCOUNTER — Encounter: Payer: Self-pay | Admitting: Family Medicine

## 2012-08-16 VITALS — BP 130/84 | HR 93 | Resp 18 | Ht 64.0 in | Wt 232.1 lb

## 2012-08-16 DIAGNOSIS — Z720 Tobacco use: Secondary | ICD-10-CM

## 2012-08-16 DIAGNOSIS — N3000 Acute cystitis without hematuria: Secondary | ICD-10-CM

## 2012-08-16 DIAGNOSIS — I10 Essential (primary) hypertension: Secondary | ICD-10-CM

## 2012-08-16 DIAGNOSIS — F17200 Nicotine dependence, unspecified, uncomplicated: Secondary | ICD-10-CM

## 2012-08-16 DIAGNOSIS — J4520 Mild intermittent asthma, uncomplicated: Secondary | ICD-10-CM

## 2012-08-16 DIAGNOSIS — M25519 Pain in unspecified shoulder: Secondary | ICD-10-CM

## 2012-08-16 DIAGNOSIS — J45909 Unspecified asthma, uncomplicated: Secondary | ICD-10-CM

## 2012-08-16 DIAGNOSIS — N390 Urinary tract infection, site not specified: Secondary | ICD-10-CM

## 2012-08-16 DIAGNOSIS — M25511 Pain in right shoulder: Secondary | ICD-10-CM

## 2012-08-16 DIAGNOSIS — E669 Obesity, unspecified: Secondary | ICD-10-CM

## 2012-08-16 LAB — POCT URINALYSIS DIPSTICK
Bilirubin, UA: NEGATIVE
Blood, UA: NEGATIVE
Glucose, UA: NEGATIVE
Ketones, UA: NEGATIVE
Nitrite, UA: NEGATIVE
pH, UA: 6.5

## 2012-08-16 MED ORDER — HALOBETASOL PROPIONATE 0.05 % EX CREA: TOPICAL_CREAM | Freq: Two times a day (BID) | CUTANEOUS | Status: DC

## 2012-08-16 MED ORDER — HYDROXYZINE PAMOATE 50 MG PO CAPS: 50.0000 mg | ORAL_CAPSULE | Freq: Three times a day (TID) | ORAL | Status: AC | PRN

## 2012-08-16 MED ORDER — IBUPROFEN 800 MG PO TABS: 800.0000 mg | ORAL_TABLET | Freq: Three times a day (TID) | ORAL | Status: DC | PRN

## 2012-08-16 MED ORDER — FLUTICASONE PROPIONATE HFA 110 MCG/ACT IN AERO: 1.0000 | INHALATION_SPRAY | Freq: Two times a day (BID) | RESPIRATORY_TRACT | Status: DC

## 2012-08-16 MED ORDER — CEPHALEXIN 500 MG PO CAPS: 500.0000 mg | ORAL_CAPSULE | Freq: Two times a day (BID) | ORAL | Status: AC

## 2012-08-16 MED ORDER — ALBUTEROL SULFATE HFA 108 (90 BASE) MCG/ACT IN AERS: 2.0000 | INHALATION_SPRAY | RESPIRATORY_TRACT | Status: DC | PRN

## 2012-08-16 NOTE — Patient Instructions (Addendum)
Restart itching medication For asthma start Flovent twice a day Use albuterol for rescue inhaler Antibiotics for UTI Increase water  Review handout on foods for weight loss  F/U End of April

## 2012-08-16 NOTE — Progress Notes (Signed)
  Subjective:    Patient ID: Robin Sherman, female    DOB: 07/09/1961, 51 y.o.   MRN: [id number]  HPI Patient here to follow chronic medical problems. She's been checking her blood pressure and has been good off of her medications. She's had a foul odor to her urine for the past 4 weeks she denies any burning sensation or frequency. She will like a medication refills for her dermatitis on her hands. She requested ibuprofen couple weeks ago however this medication was for her aches and pains. 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 Has to use albuterol 2-3 times a day since winter started, out of her inhaler. Coughs a lot during day   Review of Systems   GEN- denies fatigue, fever, weight loss,weakness, recent illness HEENT- denies eye drainage, change in vision, nasal discharge, CVS- denies chest pain, palpitations RESP- denies SOB, +cough, wheeze ABD- denies N/V, change in stools, abd pain GU- denies dysuria, hematuria, dribbling, incontinence MSK- + joint pain, muscle aches, injury Neuro- denies headache, dizziness, syncope, seizure activity      Objective:   Physical Exam  GEN- NAD, alert and oriented x3, obese HEENT- PERRL, EOMI, non injected sclera, pink conjunctiva, MMM, oropharynx clear Neck- Supple,  CVS- RRR, no murmur RESP-CTAB ABD-NABS,soft,NT,ND, CVA tenderness EXT- No edema Pulses- Radial 2+ MSK- Normal inspection shoulder knees, good ROM, rotator cuff in tact bilat, knees no effusion  Back- neg SLR, mild TTP lumbar region        Assessment & Plan:

## 2012-08-18 DIAGNOSIS — N390 Urinary tract infection, site not specified: Secondary | ICD-10-CM | POA: Insufficient documentation

## 2012-08-18 NOTE — Assessment & Plan Note (Signed)
Discussed diet and exercise in detail contributing to joint pains

## 2012-08-18 NOTE — Assessment & Plan Note (Signed)
Uncomplicated UTI, keflex

## 2012-08-18 NOTE — Assessment & Plan Note (Signed)
She has not committed to tobacco cessation, continue to encourage cessation

## 2012-08-18 NOTE — Assessment & Plan Note (Signed)
Increased symptoms throughout week, start flovent BID, continue albuterol, discussed smoking

## 2012-08-18 NOTE — Assessment & Plan Note (Signed)
Well controlled off meds 

## 2012-08-18 NOTE — Assessment & Plan Note (Signed)
Ibuprofen prn 

## 2012-09-23 ENCOUNTER — Encounter: Payer: Self-pay | Admitting: Family Medicine

## 2012-09-23 ENCOUNTER — Ambulatory Visit (INDEPENDENT_AMBULATORY_CARE_PROVIDER_SITE_OTHER): Payer: BC Managed Care – PPO | Admitting: Family Medicine

## 2012-09-23 VITALS — BP 138/98 | HR 88 | Resp 18 | Ht 64.0 in | Wt 227.0 lb

## 2012-09-23 DIAGNOSIS — Z1321 Encounter for screening for nutritional disorder: Secondary | ICD-10-CM

## 2012-09-23 DIAGNOSIS — I10 Essential (primary) hypertension: Secondary | ICD-10-CM

## 2012-09-23 DIAGNOSIS — Z130 Encounter for screening for diseases of the blood and blood-forming organs and certain disorders involving the immune mechanism: Secondary | ICD-10-CM

## 2012-09-23 DIAGNOSIS — R51 Headache: Secondary | ICD-10-CM

## 2012-09-23 DIAGNOSIS — J45909 Unspecified asthma, uncomplicated: Secondary | ICD-10-CM

## 2012-09-23 MED ORDER — AMLODIPINE BESYLATE 10 MG PO TABS: 10.0000 mg | ORAL_TABLET | Freq: Every day | ORAL | Status: DC

## 2012-09-23 MED ORDER — KETOROLAC TROMETHAMINE 60 MG/2ML IJ SOLN
60.0000 mg | Freq: Once | INTRAMUSCULAR | Status: AC
  Administered 2012-09-23: 60 mg via INTRAMUSCULAR

## 2012-09-23 MED ORDER — TRAMADOL HCL 50 MG PO TABS: 50.0000 mg | ORAL_TABLET | Freq: Four times a day (QID) | ORAL | Status: DC | PRN

## 2012-09-23 MED ORDER — AZITHROMYCIN 250 MG PO TABS: ORAL_TABLET | ORAL | Status: AC

## 2012-09-23 MED ORDER — PREDNISONE 10 MG PO TABS: ORAL_TABLET | ORAL | Status: DC

## 2012-09-23 NOTE — Progress Notes (Signed)
  Subjective:    Patient ID: Robin Sherman, female    DOB: 12-25-1960, 52 y.o.   MRN: [id number]  HPI  Headache almost daily for the past 2 weeks, comes and goes, feel pain all over, no Photophobia, no N/V Has also had a cold, has been wheezing, with productive cough, could not afford inhalers. Blood pressure has been elevated some at home.   Review of Systems   GEN- denies fatigue, fever, weight loss,weakness, recent illness HEENT- denies eye drainage, change in vision, nasal discharge, CVS- denies chest pain, palpitations RESP- denies SOB, cough, wheeze ABD- denies N/V, change in stools, abd pain GU- denies dysuria, hematuria, dribbling, incontinence MSK- denies joint pain, muscle aches, injury Neuro-+ headache, dizziness, syncope, seizure activity      Objective:   Physical Exam GEN- NAD, alert and oriented x3 HEENT- PERRL, EOMI, non injected sclera, pink conjunctiva, MMM, oropharynx clear , TM clear bilat no effusion,  Fundus benign, nares clear  Neck- Supple, shotty LAD CVS- RRR, no murmur RESP-few scattered wheeze, normal WOB, mild cough, no retractions  EXT- No edema Pulses- Radial 2+         Assessment & Plan:

## 2012-09-23 NOTE — Patient Instructions (Addendum)
Pick up the albuterol Zpak given Prednisone as directed Restart blood pressure medication Toradol shot given Ultram for headache F/U in May for physical, get labs done fasting

## 2012-09-25 DIAGNOSIS — J45909 Unspecified asthma, uncomplicated: Secondary | ICD-10-CM | POA: Insufficient documentation

## 2012-09-25 DIAGNOSIS — R51 Headache: Secondary | ICD-10-CM

## 2012-09-25 DIAGNOSIS — R519 Headache, unspecified: Secondary | ICD-10-CM | POA: Insufficient documentation

## 2012-09-25 HISTORY — DX: Headache: R51

## 2012-09-25 NOTE — Assessment & Plan Note (Signed)
I think this is multifactorial in setting of illness and elevated BP See above Add ultram for pain, allergy to tylenol products Ibuprofen/aleve has not helped

## 2012-09-25 NOTE — Assessment & Plan Note (Signed)
Treat with prednisone burst, she is to get albuterol only as she can not afford flovent Zpak for bronchitis

## 2012-09-25 NOTE — Assessment & Plan Note (Signed)
Restart norvasc °

## 2012-11-15 ENCOUNTER — Ambulatory Visit: Payer: BC Managed Care – PPO | Admitting: Family Medicine

## 2012-12-02 ENCOUNTER — Other Ambulatory Visit (HOSPITAL_COMMUNITY)
Admission: RE | Admit: 2012-12-02 | Discharge: 2012-12-02 | Disposition: A | Discharge status: Home / Self Care | Payer: BC Managed Care – PPO | Source: Ambulatory Visit | Attending: Family Medicine | Admitting: Family Medicine

## 2012-12-02 ENCOUNTER — Encounter: Payer: Self-pay | Admitting: Family Medicine

## 2012-12-02 ENCOUNTER — Ambulatory Visit (INDEPENDENT_AMBULATORY_CARE_PROVIDER_SITE_OTHER): Payer: BC Managed Care – PPO | Admitting: Family Medicine

## 2012-12-02 VITALS — BP 124/80 | HR 86 | Resp 18 | Ht 64.0 in | Wt 226.1 lb

## 2012-12-02 DIAGNOSIS — I10 Essential (primary) hypertension: Secondary | ICD-10-CM

## 2012-12-02 DIAGNOSIS — F17200 Nicotine dependence, unspecified, uncomplicated: Secondary | ICD-10-CM

## 2012-12-02 DIAGNOSIS — N760 Acute vaginitis: Secondary | ICD-10-CM | POA: Insufficient documentation

## 2012-12-02 DIAGNOSIS — Z720 Tobacco use: Secondary | ICD-10-CM

## 2012-12-02 DIAGNOSIS — M129 Arthropathy, unspecified: Secondary | ICD-10-CM

## 2012-12-02 DIAGNOSIS — M2550 Pain in unspecified joint: Secondary | ICD-10-CM

## 2012-12-02 DIAGNOSIS — Z01419 Encounter for gynecological examination (general) (routine) without abnormal findings: Secondary | ICD-10-CM | POA: Insufficient documentation

## 2012-12-02 DIAGNOSIS — Z1212 Encounter for screening for malignant neoplasm of rectum: Secondary | ICD-10-CM

## 2012-12-02 DIAGNOSIS — R300 Dysuria: Secondary | ICD-10-CM

## 2012-12-02 DIAGNOSIS — Z113 Encounter for screening for infections with a predominantly sexual mode of transmission: Secondary | ICD-10-CM | POA: Insufficient documentation

## 2012-12-02 DIAGNOSIS — Z1211 Encounter for screening for malignant neoplasm of colon: Secondary | ICD-10-CM

## 2012-12-02 DIAGNOSIS — M1990 Unspecified osteoarthritis, unspecified site: Secondary | ICD-10-CM

## 2012-12-02 DIAGNOSIS — E669 Obesity, unspecified: Secondary | ICD-10-CM

## 2012-12-02 LAB — POCT URINALYSIS DIPSTICK
Blood, UA: NEGATIVE
Glucose, UA: NEGATIVE
Leukocytes, UA: NEGATIVE
Nitrite, UA: NEGATIVE
Urobilinogen, UA: 0.2

## 2012-12-02 LAB — POC HEMOCCULT BLD/STL (OFFICE/1-CARD/DIAGNOSTIC): Fecal Occult Blood, POC: NEGATIVE

## 2012-12-02 MED ORDER — HALOBETASOL PROPIONATE 0.05 % EX CREA: TOPICAL_CREAM | Freq: Two times a day (BID) | CUTANEOUS | Status: DC

## 2012-12-02 MED ORDER — TRAMADOL HCL 50 MG PO TABS: 100.0000 mg | ORAL_TABLET | Freq: Four times a day (QID) | ORAL | Status: DC | PRN

## 2012-12-02 MED ORDER — AMLODIPINE BESYLATE 10 MG PO TABS: 10.0000 mg | ORAL_TABLET | Freq: Every day | ORAL | Status: DC

## 2012-12-02 NOTE — Assessment & Plan Note (Signed)
Continue to work on cessation, pt not ready to quit

## 2012-12-02 NOTE — Addendum Note (Signed)
Addended by: Milinda Antis F on: 12/02/2012 09:51 PM   Modules accepted: Orders

## 2012-12-02 NOTE — Assessment & Plan Note (Signed)
Will try ultram for joint pain, I think this is related to weight, early OA, overuse

## 2012-12-02 NOTE — Assessment & Plan Note (Addendum)
PAP Smear done, cultures for discharge Fasting labs Schedule Mammogram Colon cancer screen

## 2012-12-02 NOTE — Progress Notes (Addendum)
  Subjective:    Patient ID: Robin Sherman, female    DOB: 1960/12/30, 52 y.o.   MRN: [id number]  HPI  Pt here for CPE , PAP Smear due, overdue for Mammogram Immunizations UTD Due for fasting labs Complains of joint pain on and off past few years, remembers injuring right shoulder after a fall in bathroom 3 years ago. Always sore after work, hands feel stiff, knees, shoulders. Worse in AM, feels she needs to move around a little before they loosen up  Review of Systems  GEN- denies fatigue, fever, weight loss,weakness, recent illness HEENT- denies eye drainage, change in vision, nasal discharge, CVS- denies chest pain, palpitations RESP- denies SOB, cough, wheeze ABD- denies N/V, change in stools, abd pain GU- denies dysuria, hematuria, dribbling, incontinence MSK- + joint pain, muscle aches, injury Neuro- denies headache, dizziness, syncope, seizure activity      Objective:   Physical Exam GEN- NAD, alert and oriented x3 HEENT- PERRL, EOMI, non injected sclera, pink conjunctiva, MMM, oropharynx clear, TM clear bilat Neck- Supple, fair ROM CVS- RRR, no murmur RESP-CTAB Breast- normal symmetry, no nipple inversion,no nipple drainage, no nodules or lumps felt Nodes- no axillary nodes GU- normal external genitalia, vaginal mucosa pink and moist, cervix visualized no growth, no blood form os, + discharge, no CMT, no ovarian masses, uterus normal size Rectum- normal tone, no external lesions, FOBT neg MSK- Bilat knees- normal inspection, good ROM, UE- Normal inspection, rotator cuff in tact UE, UE good ROM pain with extension at right shoulder , hands no swan deformity, no swelling in DIP, orPIP EXT- Trace pedal  edema Pulses- Radial, DP- 2+        Assessment & Plan:

## 2012-12-02 NOTE — Assessment & Plan Note (Signed)
Discussed diet, needs to work on food choices , increase activity

## 2012-12-02 NOTE — Assessment & Plan Note (Signed)
Improved on meds 

## 2012-12-02 NOTE — Patient Instructions (Addendum)
I recommend eye visit once a year I recommend dental visit every 6 months Goal is to  Exercise 30 minutes 5 days a week Continue to work on weight loss We will send a letter with lab results  Get the labs done today Take the ulram 1-2 tablets Schedule your mammogram Referral for colonoscopy to be done F/U 4 months [employer]

## 2012-12-03 LAB — COMPREHENSIVE METABOLIC PANEL
Alkaline Phosphatase: 109 U/L (ref 39–117)
CO2: 24 mEq/L (ref 19–32)
Creat: 0.92 mg/dL (ref 0.50–1.10)
Glucose, Bld: 91 mg/dL (ref 70–99)
Sodium: 140 mEq/L (ref 135–145)
Total Bilirubin: 0.2 mg/dL — ABNORMAL LOW (ref 0.3–1.2)
Total Protein: 7 g/dL (ref 6.0–8.3)

## 2012-12-03 LAB — CBC
HCT: 39 % (ref 36.0–46.0)
MCH: 27.9 pg (ref 26.0–34.0)
MCV: 83.7 fL (ref 78.0–100.0)
RBC: 4.66 MIL/uL (ref 3.87–5.11)
WBC: 7.7 10*3/uL (ref 4.0–10.5)

## 2012-12-03 LAB — LIPID PANEL
HDL: 42 mg/dL (ref >39)
LDL Cholesterol: 114 mg/dL — ABNORMAL HIGH (ref 0–99)
Total CHOL/HDL Ratio: 4.2 Ratio
Triglycerides: 105 mg/dL (ref <150)

## 2012-12-03 LAB — SEDIMENTATION RATE: Sed Rate: 16 mm/hr (ref 0–22)

## 2012-12-04 LAB — C-REACTIVE PROTEIN: CRP: 1.1 mg/dL — ABNORMAL HIGH (ref <0.60)

## 2012-12-05 MED ORDER — METRONIDAZOLE 500 MG PO TABS: 500.0000 mg | ORAL_TABLET | Freq: Two times a day (BID) | ORAL | Status: AC

## 2012-12-05 MED ORDER — ERGOCALCIFEROL 1.25 MG (50000 UT) PO CAPS: 50000.0000 [IU] | ORAL_CAPSULE | ORAL | Status: DC

## 2012-12-05 NOTE — Addendum Note (Signed)
Addended by: Milinda Antis F on: 12/05/2012 01:08 PM   Modules accepted: Orders

## 2012-12-20 ENCOUNTER — Telehealth: Payer: Self-pay

## 2012-12-20 NOTE — Telephone Encounter (Signed)
Someone from the number returned call and I gave a message to have pt call. ( she works 3rd shift).

## 2012-12-20 NOTE — Telephone Encounter (Addendum)
Called. Many rings and no answer.  

## 2012-12-31 NOTE — Telephone Encounter (Signed)
Letter to pt

## 2013-01-06 ENCOUNTER — Telehealth: Payer: Self-pay

## 2013-01-07 ENCOUNTER — Other Ambulatory Visit: Payer: Self-pay

## 2013-01-07 DIAGNOSIS — Z1211 Encounter for screening for malignant neoplasm of colon: Secondary | ICD-10-CM

## 2013-01-07 NOTE — Telephone Encounter (Signed)
Gastroenterology Pre-Procedure Form    Request Date: 01/07/2013     Requesting Physician: Dr. Jeanice Lim     PATIENT INFORMATION:  Robin Sherman is a 52 y.o., female (DOB=October 11, 1960).  PROCEDURE: Procedure(s) requested: colonoscopy Procedure Reason: screening for colon cancer  PATIENT REVIEW QUESTIONS: The patient reports the following:   1. Diabetes Melitis: no 2. Joint replacements in the past 12 months: no 3. Major health problems in the past 3 months: no 4. Has an artificial valve or MVP:no 5. Has been advised in past to take antibiotics in advance of a procedure like teeth cleaning: no}    MEDICATIONS & ALLERGIES:    Patient reports the following regarding taking any blood thinners:   Plavix? no Aspirin?no Coumadin?  no  Patient confirms/reports the following medications:  Current Outpatient Prescriptions  Medication Sig Dispense Refill  . amLODipine (NORVASC) 10 MG tablet Take 1 tablet (10 mg total) by mouth daily.  30 tablet  6  . ergocalciferol (VITAMIN D2) 50000 UNITS capsule Take 1 capsule (50,000 Units total) by mouth once a week.  4 capsule  1  . halobetasol (ULTRAVATE) 0.05 % cream Apply topically 2 (two) times daily.  50 g  3  . ibuprofen (ADVIL,MOTRIN) 800 MG tablet Take 1 tablet (800 mg total) by mouth every 8 (eight) hours as needed for pain.  30 tablet  2  . traMADol (ULTRAM) 50 MG tablet Take 2 tablets (100 mg total) by mouth every 6 (six) hours as needed for pain.  60 tablet  3  . [DISCONTINUED] lisinopril-hydrochlorothiazide (PRINZIDE,ZESTORETIC) 20-12.5 MG per tablet Take 1 tablet by mouth daily.         No current facility-administered medications for this visit.    Patient confirms/reports the following allergies:  Allergies  Allergen Reactions  . Codeine Hives  . Tylenol (Acetaminophen) Hives  . Naproxen Rash    Patient is appropriate to schedule for requested procedure(s): yes  AUTHORIZATION INFORMATION Primary Insurance:   ID #:   Group #:   Pre-Cert / Auth required:  Pre-Cert / Auth #:   Secondary Insurance:   ID #:   Group #:  Pre-Cert / Auth required: Pre-Cert / Auth #:   No orders of the defined types were placed in this encounter.    SCHEDULE INFORMATION: Procedure has been scheduled as follows:  Date: 01/27/2013    Time: 8:30 AM  Location: [facility] Short Stay  This Gastroenterology Pre-Precedure Form is being routed to the following provider(s) for review: Jonette Eva, MD

## 2013-01-07 NOTE — Telephone Encounter (Signed)
PREPOPIK-DRINK WATER TO KEEP URINE LIGHT YELLOW.  PT SHOULD DROP OFF RX 3 DAYS PRIOR TO PROCEDURE.  

## 2013-01-08 MED ORDER — SOD PICOSULFATE-MAG OX-CIT ACD 10-3.5-12 MG-GM-GM PO PACK: 1.0000 | PACK | Freq: Once | ORAL | Status: DC

## 2013-01-08 NOTE — Telephone Encounter (Signed)
Rx sent to the pharmacy and instructions mailed to pt.  

## 2013-01-13 ENCOUNTER — Encounter (HOSPITAL_COMMUNITY): Payer: Self-pay | Admitting: Pharmacy Technician

## 2013-01-27 ENCOUNTER — Ambulatory Visit (HOSPITAL_COMMUNITY)
Admission: RE | Admit: 2013-01-27 | Discharge: 2013-01-27 | Disposition: A | Discharge status: Home / Self Care | Payer: BC Managed Care – PPO | Source: Ambulatory Visit | Attending: Gastroenterology | Admitting: Gastroenterology

## 2013-01-27 ENCOUNTER — Encounter (HOSPITAL_COMMUNITY)
Admission: RE | Disposition: A | Discharge status: Home / Self Care | Payer: Self-pay | Source: Ambulatory Visit | Attending: Gastroenterology

## 2013-01-27 ENCOUNTER — Encounter (HOSPITAL_COMMUNITY): Payer: Self-pay | Admitting: *Deleted

## 2013-01-27 DIAGNOSIS — K5730 Diverticulosis of large intestine without perforation or abscess without bleeding: Secondary | ICD-10-CM

## 2013-01-27 DIAGNOSIS — K648 Other hemorrhoids: Secondary | ICD-10-CM | POA: Insufficient documentation

## 2013-01-27 DIAGNOSIS — I10 Essential (primary) hypertension: Secondary | ICD-10-CM | POA: Insufficient documentation

## 2013-01-27 DIAGNOSIS — Z1211 Encounter for screening for malignant neoplasm of colon: Secondary | ICD-10-CM | POA: Insufficient documentation

## 2013-01-27 HISTORY — PX: COLONOSCOPY: SHX5424

## 2013-01-27 SURGERY — COLONOSCOPY
Anesthesia: Moderate Sedation

## 2013-01-27 MED ORDER — STERILE WATER FOR IRRIGATION IR SOLN
Status: DC | PRN
  Administered 2013-01-27: 09:00:00

## 2013-01-27 MED ORDER — MIDAZOLAM HCL 5 MG/5ML IJ SOLN
INTRAMUSCULAR | Status: AC
  Filled 2013-01-27: qty 10

## 2013-01-27 MED ORDER — SODIUM CHLORIDE 0.9 % IV SOLN
INTRAVENOUS | Status: DC
  Administered 2013-01-27: 08:00:00 via INTRAVENOUS

## 2013-01-27 MED ORDER — MEPERIDINE HCL 100 MG/ML IJ SOLN
INTRAMUSCULAR | Status: AC
  Filled 2013-01-27: qty 1

## 2013-01-27 MED ORDER — MIDAZOLAM HCL 5 MG/5ML IJ SOLN
INTRAMUSCULAR | Status: DC | PRN
  Administered 2013-01-27: 1 mg via INTRAVENOUS
  Administered 2013-01-27 (×2): 2 mg via INTRAVENOUS

## 2013-01-27 MED ORDER — MEPERIDINE HCL 100 MG/ML IJ SOLN
INTRAMUSCULAR | Status: DC | PRN
  Administered 2013-01-27 (×3): 25 mg via INTRAVENOUS

## 2013-01-27 MED ORDER — MEPERIDINE HCL 50 MG/ML IJ SOLN
INTRAMUSCULAR | Status: AC
  Filled 2013-01-27: qty 1

## 2013-01-27 NOTE — Op Note (Signed)
[facility] [address] [place], [address]   COLONOSCOPY PROCEDURE REPORT  PATIENT: Robin, Sherman  MR#: [id number] BIRTHDATE: July 21, 1960 , 52  yrs. old GENDER: Female ENDOSCOPIST: Jonette Eva, MD REFERRED AO:[id number] Valdese, M.D. PROCEDURE DATE:  01/27/2013 PROCEDURE:   Colonoscopy, screening INDICATIONS:Average risk patient for colon cancer.  PT ATE [place] NECKS YESTERDAY. MEDICATIONS: Demerol 75 mg IV and Versed 5 mg IV  DESCRIPTION OF PROCEDURE:    Physical exam was performed.  Informed consent was obtained from the patient after explaining the benefits, risks, and alternatives to procedure.  The patient was connected to monitor and placed in left lateral position. Continuous oxygen was provided by nasal cannula and IV medicine administered through an indwelling cannula.  After administration of sedation and rectal exam, the patients rectum was intubated and the EC-[id number] ([id number])  colonoscope was advanced under direct visualization to the cecum.  The scope was removed slowly by carefully examining the color, texture, anatomy, and integrity mucosa on the way out.  The patient was recovered in endoscopy and discharged home in satisfactory condition.       COLON FINDINGS: There was moderate diverticulosis noted in the sigmoid colon with associated muscular hypertrophy and angulation. , The colon was otherwise normal.  There was inflammation, polyps or cancers unless previously stated.  , and Moderate sized internal hemorrhoids were found.    UNABLE TO SEE POLYPS LESS THAN 5 MM DUE TO POOR PREP IN RIGHT COLON.  PREP QUALITY: POOR IN RIGHT COLON-good  FROM TRANVERSE TO SIGMOID COLON.      CECAL W/D TIME: 10 minutes  COMPLICATIONS: None  ENDOSCOPIC IMPRESSION: 1.   Moderate diverticulosis noted in the sigmoid colon 2.   Moderate sized internal hemorrhoids   RECOMMENDATIONS: HIGH FIBER DIET TCS IN 5 YEARS BECAUSE PREP IN RIGHT COLON WAS  POOR       _______________________________ eSignedJonette Eva, MD 01/27/2013 9:39 AM

## 2013-01-27 NOTE — H&P (Signed)
  Primary Care Physician:  Milinda Antis, MD Primary Gastroenterologist:  Dr. Darrick Penna  Pre-Procedure History & Physical: HPI:  Robin Sherman is a 52 y.o. female here for COLON CANCER SCREENING.  Past Medical History  Diagnosis Date  . Hypertension   . Asthma   . Eczema     Followed by Dr. Margo Aye dermatology    Past Surgical History  Procedure Laterality Date  . Tubal ligation      [facility]    Prior to Admission medications   Medication Sig Start Date End Date Taking? Authorizing Provider  amLODipine (NORVASC) 10 MG tablet Take 1 tablet (10 mg total) by mouth daily. 12/02/12 12/02/13 Yes Salley Scarlet, MD  ergocalciferol (VITAMIN D2) 50000 UNITS capsule Take 1 capsule (50,000 Units total) by mouth once a week. 12/05/12  Yes Salley Scarlet, MD  halobetasol (ULTRAVATE) 0.05 % cream Apply topically 2 (two) times daily. 12/02/12  Yes Salley Scarlet, MD  ibuprofen (ADVIL,MOTRIN) 800 MG tablet Take 1 tablet (800 mg total) by mouth every 8 (eight) hours as needed for pain. 08/16/12  Yes Salley Scarlet, MD  Sod Picosulfate-Mag Ox-Cit Acd 10-3.5-12 MG-GM-GM PACK Take 1 kit by mouth once. 01/08/13  Yes [place] Bali, MD  traMADol (ULTRAM) 50 MG tablet Take 2 tablets (100 mg total) by mouth every 6 (six) hours as needed for pain. 12/02/12 12/02/13 Yes Salley Scarlet, MD    Allergies as of 01/07/2013 - Review Complete 01/06/2013  Allergen Reaction Noted  . Codeine Hives 03/18/2011  . Tylenol (acetaminophen) Hives 07/27/2011  . Naproxen Rash 11/27/2011    Family History  Problem Relation Age of Onset  . Heart disease Mother     MI  . Stroke Father   . Heart disease Father     MI  . Cancer Sister   . Colon cancer Neg Hx     History   Social History  . Marital Status: Married    Spouse Name: N/A    Number of Children: N/A  . Years of Education: N/A   Occupational History  . Not on file.   Social History Main Topics  . Smoking status: Current Every Day Smoker --  0.50 packs/day for 20 years    Types: Cigarettes  . Smokeless tobacco: Not on file  . Alcohol Use: Yes     Comment: Rare  . Drug Use: No  . Sexually Active: Not on file   Other Topics Concern  . Not on file   Social History Narrative  . No narrative on file    Review of Systems: See HPI, otherwise negative ROS   Physical Exam: BP 131/89  Temp(Src) 97.7 F (36.5 C) (Oral)  Resp 21  Ht 5\' 4"  (1.626 m)  Wt 229 lb (103.874 kg)  BMI 39.29 kg/m2  SpO2 96%  LMP 09/14/2012 General:   Alert,  pleasant and cooperative in NAD Head:  Normocephalic and atraumatic. Neck:  Supple; Lungs:  Clear throughout to auscultation.    Heart:  Regular rate and rhythm. Abdomen:  Soft, nontender and nondistended. Normal bowel sounds, without guarding, and without rebound.   Neurologic:  Alert and  oriented x4;  grossly normal neurologically.  Impression/Plan:     SCREENING  Plan:  1. TCS TODAY

## 2013-01-29 ENCOUNTER — Encounter (HOSPITAL_COMMUNITY): Payer: Self-pay | Admitting: Gastroenterology

## 2013-02-14 ENCOUNTER — Telehealth: Payer: Self-pay | Admitting: Family Medicine

## 2013-02-14 MED ORDER — ERGOCALCIFEROL 1.25 MG (50000 UT) PO CAPS: 50000.0000 [IU] | ORAL_CAPSULE | ORAL | Status: DC

## 2013-02-14 NOTE — Telephone Encounter (Signed)
Med refilled.

## 2013-04-15 ENCOUNTER — Telehealth: Payer: Self-pay | Admitting: Family Medicine

## 2013-04-15 ENCOUNTER — Encounter: Payer: Self-pay | Admitting: Family Medicine

## 2013-04-15 ENCOUNTER — Ambulatory Visit (INDEPENDENT_AMBULATORY_CARE_PROVIDER_SITE_OTHER): Payer: BC Managed Care – PPO | Admitting: Family Medicine

## 2013-04-15 VITALS — BP 118/72 | HR 84 | Temp 98.3°F | Resp 18 | Ht 63.5 in | Wt 228.0 lb

## 2013-04-15 DIAGNOSIS — N938 Other specified abnormal uterine and vaginal bleeding: Secondary | ICD-10-CM

## 2013-04-15 DIAGNOSIS — J209 Acute bronchitis, unspecified: Secondary | ICD-10-CM

## 2013-04-15 DIAGNOSIS — N949 Unspecified condition associated with female genital organs and menstrual cycle: Secondary | ICD-10-CM

## 2013-04-15 DIAGNOSIS — N925 Other specified irregular menstruation: Secondary | ICD-10-CM

## 2013-04-15 DIAGNOSIS — M2550 Pain in unspecified joint: Secondary | ICD-10-CM

## 2013-04-15 LAB — CBC
MCV: 84.3 fL (ref 78.0–100.0)
Platelets: 385 10*3/uL (ref 150–400)
RDW: 14.4 % (ref 11.5–15.5)
WBC: 8.2 10*3/uL (ref 4.0–10.5)

## 2013-04-15 MED ORDER — METHYLPREDNISOLONE (PAK) 4 MG PO TABS: ORAL_TABLET | ORAL | Status: DC

## 2013-04-15 MED ORDER — AZITHROMYCIN 250 MG PO TABS: ORAL_TABLET | ORAL | Status: DC

## 2013-04-15 NOTE — Patient Instructions (Signed)
Use albuterol at bedtime or as needed Take prednisone as prescribed Take zpak antibiotic Referral to rheumatology  Ultrasound to be set up for the bleeding F/U 4 months

## 2013-04-15 NOTE — Progress Notes (Signed)
  Subjective:    Patient ID: Robin Sherman, female    DOB: 15-Feb-1961, 52 y.o.   MRN: [id number]  HPI Per visit here for CPE, had CPE with PAP in May 2014 Pt here with Cough , SOB, wheezing worse at night when she goes to lay down for the past 2 weeks. Continues to smoke, cough productive of sputum. No OTC meds taken. Continues to have joint pain all other, in shoulder, wrist, hands, and at times LE. Feels like she cant move some days. Heavy menses- past 2 months has had bleeding non stop, will taper to light flow and start again, passing some clots. Prior to had irregular cycles that would skip 1-2 months.    Review of Systems  GEN- denies fatigue, fever, weight loss,weakness, recent illness HEENT- denies eye drainage, change in vision, nasal discharge, CVS- denies chest pain, palpitations RESP- + SOB,+ cough,wheeze ABD- denies N/V, change in stools, abd pain MSK- + joint pain, muscle aches, injury Neuro- denies headache, dizziness, syncope, seizure activity      Objective:   Physical Exam GEN- NAD, alert and oriented x3 HEENT- PERRL, EOMI, non injected sclera, pink conjunctiva, MMM, oropharynx no injection, TM clear bilat no effusion, no maxillary sinus tenderness,  Neck- Supple, no LAD CVS- RRR, no murmur RESP-few scattered wheeze, normal WOB, no rhonchi, harsh cough ABD-NABS,soft,NT,ND MSK- Neck FROM, bilateral shoulder Good ROM, bilateral wrist, no effusion, normal appearance, +finkelestins on right wrist, good grasp, normal flexion/extension wrist bilat EXT- No edema Pulses- Radial 2+        Assessment & Plan:

## 2013-04-15 NOTE — Telephone Encounter (Signed)
Pt said that the Nicotine patches are 21 mg. She said that you were going to tell her how to use them.

## 2013-04-15 NOTE — Telephone Encounter (Signed)
This is a 10 week plan Use the Nicoderm 21 mg for 6 weeks, then decrease to nicoderm 14mg  x 2 weeks, then Nicoderm 7mg  x 2 weeks These are all over the counter at any pharmacy

## 2013-04-15 NOTE — Telephone Encounter (Signed)
Pt aware.

## 2013-04-15 NOTE — Assessment & Plan Note (Signed)
Check FSH/LH, prolonged bleeding, at her age Check Vaginal [place], PAP normal this year

## 2013-04-15 NOTE — Assessment & Plan Note (Signed)
Albuterol sample given from office zpak prednisone

## 2013-04-15 NOTE — Assessment & Plan Note (Addendum)
Labs including ESR, CRP, RF all negative, ? Arthritis vs fibromyalgia Prednisone may help

## 2013-04-16 LAB — FSH/LH
FSH: 17.2 m[IU]/mL
LH: 3.8 m[IU]/mL

## 2013-04-16 LAB — TSH: TSH: 1.779 u[IU]/mL (ref 0.350–4.500)

## 2013-04-18 ENCOUNTER — Ambulatory Visit (HOSPITAL_COMMUNITY): Payer: BC Managed Care – PPO

## 2013-04-18 ENCOUNTER — Ambulatory Visit (HOSPITAL_COMMUNITY)
Admission: RE | Admit: 2013-04-18 | Discharge: 2013-04-18 | Disposition: A | Discharge status: Home / Self Care | Payer: BC Managed Care – PPO | Source: Ambulatory Visit | Attending: Family Medicine | Admitting: Family Medicine

## 2013-04-18 DIAGNOSIS — N949 Unspecified condition associated with female genital organs and menstrual cycle: Secondary | ICD-10-CM | POA: Insufficient documentation

## 2013-04-18 DIAGNOSIS — D259 Leiomyoma of uterus, unspecified: Secondary | ICD-10-CM | POA: Insufficient documentation

## 2013-04-18 DIAGNOSIS — N938 Other specified abnormal uterine and vaginal bleeding: Secondary | ICD-10-CM | POA: Insufficient documentation

## 2013-04-21 ENCOUNTER — Other Ambulatory Visit: Payer: Self-pay | Admitting: Family Medicine

## 2013-04-21 DIAGNOSIS — N938 Other specified abnormal uterine and vaginal bleeding: Secondary | ICD-10-CM

## 2013-04-21 DIAGNOSIS — IMO0001 Reserved for inherently not codable concepts without codable children: Secondary | ICD-10-CM

## 2013-05-05 ENCOUNTER — Ambulatory Visit (INDEPENDENT_AMBULATORY_CARE_PROVIDER_SITE_OTHER): Payer: BC Managed Care – PPO | Admitting: Obstetrics and Gynecology

## 2013-05-05 ENCOUNTER — Encounter: Payer: Self-pay | Admitting: Obstetrics and Gynecology

## 2013-05-05 VITALS — BP 160/108 | Ht 64.0 in | Wt 228.6 lb

## 2013-05-05 DIAGNOSIS — D259 Leiomyoma of uterus, unspecified: Secondary | ICD-10-CM

## 2013-05-05 NOTE — Progress Notes (Signed)
Patient ID: Robin Sherman, female   DOB: 09/25/1960, 52 y.o.   MRN: [id number] Pt here today as a referral from Dr. Jeanice Lim for Dysfunctional Uterine Bleeding.    [facility] Visit  Patient name: Robin Sherman MRN [id number]  Date of birth: Jul 09, 1961  CC & HPI:  Robin Sherman is a 53 y.o. female presenting today for AUB x 30 d with September menses. "real heavy", daily pads, no clots.  ROS:  + urine loss with cough  Pertinent History Reviewed:  Medical & Surgical Hx:  Reviewed: Significant for  Medications: Reviewed & Updated - see associated section Social History: Reviewed -  reports that she has been smoking Cigarettes.  She has a 10 pack-year smoking history. She has never used smokeless tobacco.  Objective Findings:  Vitals: BP 160/108  Ht 5\' 4"  (1.626 m)  Wt 228 lb 9.6 oz (103.692 kg)  BMI 39.22 kg/m2  LMP 04/15/2013  Physical Examination: General appearance - alert, well appearing, and in no distress Abdomen - soft, nontender, nondistended, no masses or organomegaly Physical Examination: Pelvic - VULVA: normal appearing vulva with no masses, tenderness or lesions, VAGINA: normal appearing vagina with normal color and discharge, no lesions, PELVIC FLOOR EXAM: no cystocele, rectocele or prolapse noted, CERVIX: normal appearing cervix without discharge or lesions, UTERUS: enlarged to 12 week's size with anterior fibroid sitting on the bladder, 7-8 cm Rectal - normal rectal, no masses  Assessment & Plan:   AUB, uterine fibroids, 12 wk size, with anterior 7 cm myoma overlying bladder. Perimenopause. Plan:(LATE ENTRY): Patient to return in 2 weeks for endometrial biopsy. Fluid discussed treatment options but she may choose to proceed with hysterectomy

## 2013-05-05 NOTE — Patient Instructions (Signed)
Endometrial biopsy in 2 weeks Endometrial Biopsy This is a test in which a tissue sample (a biopsy) is taken from inside the uterus (womb). It is then looked at by a specialist under a microscope to see if the tissue is normal or abnormal. The endometrium is the lining of the uterus. This test helps determine where you are in your menstrual cycle and how hormone levels are affecting the lining of the uterus. Another use for this test is to diagnose endometrial cancer, tuberculosis, polyps, or inflammatory conditions and to evaluate uterine bleeding. PREPARATION FOR TEST No preparation or fasting is necessary. NORMAL FINDINGS No pathologic conditions. Presence of "secretory-type" endometrium 3 to 5 days before to normal menstruation. Ranges for normal findings may vary among different laboratories and hospitals. You should always check with your doctor after having lab work or other tests done to discuss the meaning of your test results and whether your values are considered within normal limits. MEANING OF TEST  Your caregiver will go over the test results with you and discuss the importance and meaning of your results, as well as treatment options and the need for additional tests if necessary. OBTAINING THE TEST RESULTS It is your responsibility to obtain your test results. Ask the lab or department performing the test when and how you will get your results. Document Released: 11/03/2004 Document Revised: 09/25/2011 Document Reviewed: 06/12/2008 [facility] Patient Information 2014 [place], [place].

## 2013-05-19 ENCOUNTER — Telehealth: Payer: Self-pay

## 2013-05-19 ENCOUNTER — Other Ambulatory Visit: Payer: BC Managed Care – PPO | Admitting: Obstetrics and Gynecology

## 2013-05-21 NOTE — Telephone Encounter (Signed)
Explained to pt in office procedure for the endometrial biopsy. Pt verbalized understanding.  Will call pt back tomorrow to reschedule Endometrial biopsy appt.

## 2013-06-09 ENCOUNTER — Encounter: Payer: Self-pay | Admitting: *Deleted

## 2013-06-09 ENCOUNTER — Other Ambulatory Visit: Payer: BC Managed Care – PPO | Admitting: Obstetrics and Gynecology

## 2013-07-04 ENCOUNTER — Encounter (INDEPENDENT_AMBULATORY_CARE_PROVIDER_SITE_OTHER): Payer: Self-pay

## 2013-07-04 ENCOUNTER — Other Ambulatory Visit (INDEPENDENT_AMBULATORY_CARE_PROVIDER_SITE_OTHER): Payer: BC Managed Care – PPO | Admitting: Obstetrics and Gynecology

## 2013-07-14 ENCOUNTER — Ambulatory Visit (INDEPENDENT_AMBULATORY_CARE_PROVIDER_SITE_OTHER): Payer: BC Managed Care – PPO | Admitting: Obstetrics and Gynecology

## 2013-07-14 ENCOUNTER — Encounter: Payer: Self-pay | Admitting: Obstetrics and Gynecology

## 2013-07-14 ENCOUNTER — Other Ambulatory Visit: Payer: Self-pay | Admitting: Obstetrics and Gynecology

## 2013-07-14 VITALS — BP 132/80 | Ht 64.0 in | Wt 230.0 lb

## 2013-07-14 DIAGNOSIS — N949 Unspecified condition associated with female genital organs and menstrual cycle: Secondary | ICD-10-CM

## 2013-07-14 DIAGNOSIS — Z3202 Encounter for pregnancy test, result negative: Secondary | ICD-10-CM

## 2013-07-14 DIAGNOSIS — N938 Other specified abnormal uterine and vaginal bleeding: Secondary | ICD-10-CM

## 2013-07-14 NOTE — Progress Notes (Signed)
Scheduled surgery for 08-01-13 @7 :30am

## 2013-07-14 NOTE — Progress Notes (Signed)
Endometrial Biopsy: Patient given informed consent, signed copy in the chart, time out was performed. Time out taken. . The patient was placed in the lithotomy position and the cervix brought into view with sterile speculum.  Portio of cervix cleansed x 2 with betadine swabs.  A tenaculum was placed in the anterior lip of the cervix. The uterus was sounded for depth of 8 cm,. Milex uterine Explora 3 mm was introduced to into the uterus, suction created,  and an endometrial sample was obtained. All equipment was removed and accounted for.   The patient tolerated the procedure well.  Endometrial cavity seems stenotic due to fibroid.   Patient given post procedure instructions.  Followup: discussed  Types of hysterectomy, pros and cons of cervix preservation, pros of salpingectomy, and pros and cons of ovarian preservation. Will call pt with results. Plans are currently to proceed toward supracervical hysterectomy, with bilateral salpingectomy. 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 Pt wants to schedule for January surgery.  Pt would prefer a Friday surgery date if possible

## 2013-07-14 NOTE — Patient Instructions (Signed)
Call Monday to Samule Dry, for info regarding scheduling.

## 2013-07-18 ENCOUNTER — Encounter (HOSPITAL_COMMUNITY): Payer: Self-pay | Admitting: Pharmacy Technician

## 2013-07-22 LAB — POCT URINE PREGNANCY: Preg Test, Ur: NEGATIVE

## 2013-07-23 DIAGNOSIS — Z029 Encounter for administrative examinations, unspecified: Secondary | ICD-10-CM

## 2013-07-28 ENCOUNTER — Other Ambulatory Visit: Payer: Self-pay

## 2013-07-28 ENCOUNTER — Encounter (HOSPITAL_COMMUNITY)
Admission: RE | Admit: 2013-07-28 | Discharge: 2013-07-28 | Disposition: A | Discharge status: Home / Self Care | Payer: BC Managed Care – PPO | Source: Ambulatory Visit | Attending: Obstetrics and Gynecology | Admitting: Obstetrics and Gynecology

## 2013-07-28 ENCOUNTER — Encounter (HOSPITAL_COMMUNITY): Payer: Self-pay

## 2013-07-28 ENCOUNTER — Other Ambulatory Visit: Payer: Self-pay | Admitting: Obstetrics & Gynecology

## 2013-07-28 DIAGNOSIS — Z01812 Encounter for preprocedural laboratory examination: Secondary | ICD-10-CM | POA: Insufficient documentation

## 2013-07-28 DIAGNOSIS — Z01810 Encounter for preprocedural cardiovascular examination: Secondary | ICD-10-CM | POA: Insufficient documentation

## 2013-07-28 DIAGNOSIS — Z01818 Encounter for other preprocedural examination: Secondary | ICD-10-CM | POA: Insufficient documentation

## 2013-07-28 LAB — COMPREHENSIVE METABOLIC PANEL
ALK PHOS: 107 U/L (ref 39–117)
ALT: 19 U/L (ref 0–35)
AST: 17 U/L (ref 0–37)
Albumin: 3.5 g/dL (ref 3.5–5.2)
BUN: 12 mg/dL (ref 6–23)
CO2: 24 meq/L (ref 19–32)
Calcium: 9.3 mg/dL (ref 8.4–10.5)
Chloride: 104 mEq/L (ref 96–112)
Creatinine, Ser: 0.68 mg/dL (ref 0.50–1.10)
GFR calc non Af Amer: >90 mL/min (ref >90)
GLUCOSE: 98 mg/dL (ref 70–99)
POTASSIUM: 4.7 meq/L (ref 3.7–5.3)
SODIUM: 142 meq/L (ref 137–147)
Total Bilirubin: 0.2 mg/dL — ABNORMAL LOW (ref 0.3–1.2)
Total Protein: 7.1 g/dL (ref 6.0–8.3)

## 2013-07-28 LAB — URINE MICROSCOPIC-ADD ON

## 2013-07-28 LAB — CBC
HCT: 37.4 % (ref 36.0–46.0)
HEMOGLOBIN: 12.7 g/dL (ref 12.0–15.0)
MCH: 29.3 pg (ref 26.0–34.0)
MCHC: 34 g/dL (ref 30.0–36.0)
MCV: 86.4 fL (ref 78.0–100.0)
PLATELETS: 417 10*3/uL — AB (ref 150–400)
RBC: 4.33 MIL/uL (ref 3.87–5.11)
RDW: 14.2 % (ref 11.5–15.5)
WBC: 8.6 10*3/uL (ref 4.0–10.5)

## 2013-07-28 LAB — URINALYSIS, ROUTINE W REFLEX MICROSCOPIC
BILIRUBIN URINE: NEGATIVE
Glucose, UA: NEGATIVE mg/dL
Ketones, ur: NEGATIVE mg/dL
Nitrite: NEGATIVE
PH: 6 (ref 5.0–8.0)
SPECIFIC GRAVITY, URINE: 1.02 (ref 1.005–1.030)
UROBILINOGEN UA: 0.2 mg/dL (ref 0.0–1.0)

## 2013-07-28 LAB — HCG, QUANTITATIVE, PREGNANCY: hCG, Beta Chain, Quant, S: <1 m[IU]/mL (ref <5)

## 2013-07-28 NOTE — Patient Instructions (Addendum)
Robin Sherman  07/28/2013   Your procedure is scheduled on:  08/01/2013  Report to [facility] at  43  AM.  Call this number if you have problems the morning of surgery: [phone].          Drink 1 bottle of magnesium citrate on 07/31/2013 at 12 noon. Once you drink this, drink plenty of liquids after that to activate the laxative. You may only have full liquids after that, jello, popsicles, brooth, any other liquid besides milk or milk produts up until midnight, them nothing to eat or drink after midnight.   Remember:   Do not eat food or drink liquids after midnight.   Take these medicines the morning of surgery with A SIP OF WATER: norvasc   Do not wear jewelry, make-up or nail polish.  Do not wear lotions, powders, or perfumes.   Do not shave 48 hours prior to surgery. Men may shave face and neck.  Do not bring valuables to the hospital.  [facility] is not responsible for any belongings or valuables.               Contacts, dentures or bridgework may not be worn into surgery.  Leave suitcase in the car. After surgery it may be brought to your room.  For patients admitted to the hospital, discharge time is determined by your treatment team.               Patients discharged the day of surgery will not be allowed to drive  home.  Name and phone number of your driver: family  Special Instructions: Shower using CHG 2 nights before surgery and the night before surgery.  If you shower the day of surgery use CHG.  Use special wash - you have one bottle of CHG for all showers.  You should use approximately 1/3 of the bottle for each shower.   Please read over the following fact sheets that you were given: Pain Booklet, Coughing and Deep Breathing, Surgical Site Infection Prevention, Anesthesia Post-op Instructions and Care and Recovery After Surgery Supracervical Hysterectomy A supracervical hysterectomy is surgery to remove the top part of the uterus, but not the cervix. You will  no longer have menstrual periods or be able to get pregnant after this surgery. The fallopian tubes and ovaries may also be removed (bilateral salpingo-oophorectomy) during this surgery. This surgery is usually performed using a minimally invasive technique called laparoscopy. This technique allows the surgery to be done through small incisions. The minimally invasive technique provides benefits such as less pain, less risk of infection, and shorter recovery time. LET [facility] CARE PROVIDER KNOW ABOUT:  Any allergies you have.  All medicines you are taking, including vitamins, herbs, eye drops, creams, and over-the-counter medicines.  Previous problems you or members of your family have had with the use of anesthetics.  Any blood disorders you have.  Previous surgeries you have had.  Medical conditions you have. RISKS AND COMPLICATIONS  Generally, this is a safe procedure. However, as with any procedure, complications can occur. Possible complications include:  Bleeding.  Blood clots in the legs or lung.  Infection.  Injury to surrounding organs.  Problems related to anesthesia.  Conversion to an open abdominal surgery.  Additional surgery later to remove the cervix if you have problems with the cervix. BEFORE THE PROCEDURE  Ask your health care provider about changing or stopping your regular medicines.  Do not take aspirin or blood  thinners (anticoagulants) for 1 week before the surgery, or as directed by your health care provider.  Do not eat or drink anything for 8 hours before the surgery, or as directed by your health care provider.  Quit smoking if you smoke.  Arrange for a ride home after surgery and for someone to help you at home during recovery. PROCEDURE   You will be given an antibiotic medicine.  An IV tube will be placed in one of your veins. You will be given medicine to make you sleep (general anesthetic).  A gas (carbon dioxide) will be used to  inflate your abdomen. This will allow your surgeon to look inside your abdomen, perform your surgery, and treat any other problems found if necessary.  Three or four small incisions will be made in your abdomen. One of these incisions will be made in the area of your belly button (navel). A thin, flexible tube with a tiny camera and light on the end of it (laparoscope) will be inserted into the incision. The camera on the laparoscope sends a picture to a TV screen in the operating room. This gives your surgeon a good view inside the abdomen.  Other surgical instruments will be inserted through the other incisions.  The uterus will be cut into small pieces and removed through the small incisions.  Your incisions will be closed. AFTER THE PROCEDURE   You will be taken to a recovery area where your progress will be monitored until you are awake, stable, and taking fluids well. If there are no other problems, you will then be moved to a regular hospital room, or you will be allowed to go home.  You will likely have minimal discomfort after the surgery because the incisions are so small with the laparoscopic technique.  You will be given pain medicine while you are in the hospital and for when you go home.  If a bilateral salpingo-oophorectomy was performed before menopause, you will go through a sudden (abrupt) menopause. This can be helped with hormone medicines. Document Released: 12/20/2007 Document Revised: 04/23/2013 Document Reviewed: 01/03/2013 [facility] Patient Information 2014 [place]. PATIENT INSTRUCTIONS POST-ANESTHESIA  IMMEDIATELY FOLLOWING SURGERY:  Do not drive or operate machinery for the first twenty four hours after surgery.  Do not make any important decisions for twenty four hours after surgery or while taking narcotic pain medications or sedatives.  If you develop intractable nausea and vomiting or a severe headache please notify your doctor immediately.  FOLLOW-UP:   Please make an appointment with your surgeon as instructed. You do not need to follow up with anesthesia unless specifically instructed to do so.  WOUND CARE INSTRUCTIONS (if applicable):  Keep a dry clean dressing on the anesthesia/puncture wound site if there is drainage.  Once the wound has quit draining you may leave it open to air.  Generally you should leave the bandage intact for twenty four hours unless there is drainage.  If the epidural site drains for more than 36-48 hours please call the anesthesia department.  QUESTIONS?:  Please feel free to call your physician or the hospital operator if you have any questions, and they will be happy to assist you.

## 2013-07-29 LAB — TYPE AND SCREEN
ABO/RH(D): O POS
ANTIBODY SCREEN: NEGATIVE

## 2013-07-29 NOTE — H&P (Signed)
Robin Sherman is an 53 y.o. female.  She is scheduled for an abdominal supracervical hysterectomy with bilateral salpingectomy for abnormal bleeding with a large uterine fibroid deforming the endometrium cavity. Endometrial biopsy is normal "benign secretory endometrium", and pap is normal. She desires preservation of the cervix, and preservation of the ovaries, after a part by part discussion of the various types of hysterectomy, and the pro's and cons of various types. She is aware of the short remaining lifespan of ovarian hormonal function as well as the longterm risks of postmenopausal ovaries, as well as the benefits of hormone production by the ovaries.  She choses to have the ovaries preserved unless specific abnormalities are suspected at the time of the surgery.  Pertinent Gynecological History: Menses: flow is excessive with use of both pads or tampons on heaviest days Bleeding: heavy  Contraception: tubal ligation DES exposure: unknown Blood transfusions: none Sexually transmitted diseases: no past history negative GC/CHL in May Previous GYN Procedures: tubal  Last mammogram: normal Date: 2008 Last pap: normal Date: 5/14  OB History: G, P   Menstrual History: Menarche age:  No LMP recorded. Patient is not currently having periods (Reason: Perimenopausal).    Past Medical History  Diagnosis Date  . Hypertension   . Asthma   . Eczema     Followed by Dr. Nevada Crane dermatology    Past Surgical History  Procedure Laterality Date  . Tubal ligation      [facility]  . Colonoscopy N/A 01/27/2013    Procedure: COLONOSCOPY;  Surgeon: Danie Binder, MD;  Location: [facility];  Service: Endoscopy;  Laterality: N/A;  8:30 AM    Family History  Problem Relation Age of Onset  . Heart disease Mother     MI  . Stroke Father   . Heart disease Father     MI  . Cancer Sister     breast  . Colon cancer Neg Hx     Social History:  reports that she has been smoking Cigarettes.   She has a 10 pack-year smoking history. She has never used smokeless tobacco. She reports that she drinks alcohol. She reports that she uses illicit drugs (Marijuana).  Allergies:  Allergies  Allergen Reactions  . Codeine Hives  . Tylenol [Acetaminophen] Hives  . Naproxen Rash    No prescriptions prior to admission    Review of Systems  Respiratory: Negative.   Cardiovascular: Negative.   Gastrointestinal: Negative.   Genitourinary: Negative for dysuria and urgency.    There were no vitals taken for this visit. Physical Exam  Constitutional: She is oriented to person, place, and time. She appears well-developed and well-nourished.  HENT:  Head: Normocephalic and atraumatic.  Eyes: EOM are normal. Pupils are equal, round, and reactive to light.  Neck: Normal range of motion.  Cardiovascular: Normal rate.   Respiratory: Effort normal and breath sounds normal.  GI: Soft.  Genitourinary: Vagina normal.  Good support. Enlarged uterus to 12 weeks size, with large fundal fibroid 7-8 cm sitting anterior on bladder  Neurological: She is alert and oriented to person, place, and time.  Psychiatric: She has a normal mood and affect. Her behavior is normal. Thought content normal.    No results found for this or any previous visit (from the past 24 hour(s)).  No results found.  Assessment/Plan: Fibroid uterine enlargement 12 weeks Abnormal bleeding due to fibroids Bladder pressure from fibroid  Plan: Abdominal supracervical hysterectomy and bilateral salpingectomy on Friday 01 Aug 2013, at [facility].  Risks, benefits rationale for cervix and ovary discussed in detail with patient .   Kaliq Lege V 07/29/2013, 1:52 PM

## 2013-07-30 LAB — URINE CULTURE

## 2013-08-01 ENCOUNTER — Encounter (HOSPITAL_COMMUNITY)
Admission: RE | Disposition: A | Discharge status: Home / Self Care | Payer: Self-pay | Source: Ambulatory Visit | Attending: Obstetrics and Gynecology

## 2013-08-01 ENCOUNTER — Encounter (HOSPITAL_COMMUNITY): Payer: Self-pay | Admitting: *Deleted

## 2013-08-01 ENCOUNTER — Inpatient Hospital Stay (HOSPITAL_COMMUNITY): Payer: BC Managed Care – PPO | Admitting: Anesthesiology

## 2013-08-01 ENCOUNTER — Encounter (HOSPITAL_COMMUNITY): Payer: BC Managed Care – PPO | Admitting: Anesthesiology

## 2013-08-01 ENCOUNTER — Inpatient Hospital Stay (HOSPITAL_COMMUNITY)
Admission: RE | Admit: 2013-08-01 | Discharge: 2013-08-06 | DRG: 742 | Disposition: A | Discharge status: Home / Self Care | Payer: BC Managed Care – PPO | Source: Ambulatory Visit | Attending: Obstetrics and Gynecology | Admitting: Obstetrics and Gynecology

## 2013-08-01 DIAGNOSIS — Z90711 Acquired absence of uterus with remaining cervical stump: Secondary | ICD-10-CM | POA: Diagnosis present

## 2013-08-01 DIAGNOSIS — D62 Acute posthemorrhagic anemia: Secondary | ICD-10-CM | POA: Diagnosis not present

## 2013-08-01 DIAGNOSIS — N8000 Endometriosis of the uterus, unspecified: Secondary | ICD-10-CM

## 2013-08-01 DIAGNOSIS — I10 Essential (primary) hypertension: Secondary | ICD-10-CM | POA: Diagnosis present

## 2013-08-01 DIAGNOSIS — I959 Hypotension, unspecified: Secondary | ICD-10-CM | POA: Diagnosis present

## 2013-08-01 DIAGNOSIS — N390 Urinary tract infection, site not specified: Secondary | ICD-10-CM | POA: Diagnosis present

## 2013-08-01 DIAGNOSIS — N938 Other specified abnormal uterine and vaginal bleeding: Secondary | ICD-10-CM

## 2013-08-01 DIAGNOSIS — IMO0002 Reserved for concepts with insufficient information to code with codable children: Secondary | ICD-10-CM | POA: Diagnosis not present

## 2013-08-01 DIAGNOSIS — F17200 Nicotine dependence, unspecified, uncomplicated: Secondary | ICD-10-CM | POA: Diagnosis present

## 2013-08-01 DIAGNOSIS — J45909 Unspecified asthma, uncomplicated: Secondary | ICD-10-CM | POA: Diagnosis present

## 2013-08-01 DIAGNOSIS — L259 Unspecified contact dermatitis, unspecified cause: Secondary | ICD-10-CM | POA: Diagnosis present

## 2013-08-01 DIAGNOSIS — Z8249 Family history of ischemic heart disease and other diseases of the circulatory system: Secondary | ICD-10-CM

## 2013-08-01 DIAGNOSIS — D251 Intramural leiomyoma of uterus: Secondary | ICD-10-CM

## 2013-08-01 DIAGNOSIS — N852 Hypertrophy of uterus: Secondary | ICD-10-CM | POA: Diagnosis present

## 2013-08-01 DIAGNOSIS — N9489 Other specified conditions associated with female genital organs and menstrual cycle: Secondary | ICD-10-CM | POA: Diagnosis present

## 2013-08-01 DIAGNOSIS — N949 Unspecified condition associated with female genital organs and menstrual cycle: Secondary | ICD-10-CM

## 2013-08-01 DIAGNOSIS — Y836 Removal of other organ (partial) (total) as the cause of abnormal reaction of the patient, or of later complication, without mention of misadventure at the time of the procedure: Secondary | ICD-10-CM | POA: Diagnosis not present

## 2013-08-01 DIAGNOSIS — D259 Leiomyoma of uterus, unspecified: Secondary | ICD-10-CM | POA: Diagnosis present

## 2013-08-01 DIAGNOSIS — Z823 Family history of stroke: Secondary | ICD-10-CM

## 2013-08-01 DIAGNOSIS — B9689 Other specified bacterial agents as the cause of diseases classified elsewhere: Secondary | ICD-10-CM | POA: Diagnosis present

## 2013-08-01 HISTORY — PX: SUPRACERVICAL ABDOMINAL HYSTERECTOMY: SHX5393

## 2013-08-01 HISTORY — PX: BILATERAL SALPINGECTOMY: SHX5743

## 2013-08-01 LAB — CBC
HCT: 30.4 % — ABNORMAL LOW (ref 36.0–46.0)
HEMOGLOBIN: 10 g/dL — AB (ref 12.0–15.0)
MCH: 29.1 pg (ref 26.0–34.0)
MCHC: 32.9 g/dL (ref 30.0–36.0)
MCV: 88.4 fL (ref 78.0–100.0)
Platelets: 404 10*3/uL — ABNORMAL HIGH (ref 150–400)
RBC: 3.44 MIL/uL — ABNORMAL LOW (ref 3.87–5.11)
RDW: 14.8 % (ref 11.5–15.5)
WBC: 18.3 10*3/uL — AB (ref 4.0–10.5)

## 2013-08-01 SURGERY — HYSTERECTOMY, SUPRACERVICAL, ABDOMINAL
Anesthesia: General | Site: Abdomen

## 2013-08-01 SURGERY — Surgical Case
Anesthesia: *Unknown

## 2013-08-01 MED ORDER — AMLODIPINE BESYLATE 5 MG PO TABS
10.0000 mg | ORAL_TABLET | Freq: Every day | ORAL | Status: DC
  Filled 2013-08-01 (×2): qty 2

## 2013-08-01 MED ORDER — SUCCINYLCHOLINE CHLORIDE 20 MG/ML IJ SOLN
INTRAMUSCULAR | Status: DC | PRN
  Administered 2013-08-01: 120 mg via INTRAVENOUS

## 2013-08-01 MED ORDER — SODIUM CHLORIDE 0.9 % IJ SOLN
INTRAMUSCULAR | Status: AC
  Filled 2013-08-01: qty 20

## 2013-08-01 MED ORDER — PROPOFOL 10 MG/ML IV BOLUS
INTRAVENOUS | Status: AC
  Filled 2013-08-01: qty 20

## 2013-08-01 MED ORDER — CEFAZOLIN SODIUM-DEXTROSE 2-3 GM-% IV SOLR
2.0000 g | INTRAVENOUS | Status: AC
  Administered 2013-08-01: 2 g via INTRAVENOUS
  Filled 2013-08-01: qty 50

## 2013-08-01 MED ORDER — ALBUTEROL SULFATE (2.5 MG/3ML) 0.083% IN NEBU
2.5000 mg | INHALATION_SOLUTION | RESPIRATORY_TRACT | Status: DC | PRN
  Administered 2013-08-01 – 2013-08-03 (×6): 2.5 mg via RESPIRATORY_TRACT
  Filled 2013-08-01 (×6): qty 3

## 2013-08-01 MED ORDER — PROPOFOL 10 MG/ML IV BOLUS
INTRAVENOUS | Status: DC | PRN
  Administered 2013-08-01: 200 mg via INTRAVENOUS

## 2013-08-01 MED ORDER — DEXAMETHASONE SODIUM PHOSPHATE 4 MG/ML IJ SOLN
4.0000 mg | Freq: Once | INTRAMUSCULAR | Status: AC
  Administered 2013-08-01: 4 mg via INTRAVENOUS

## 2013-08-01 MED ORDER — BUPIVACAINE LIPOSOME 1.3 % IJ SUSP
20.0000 mL | Freq: Once | INTRAMUSCULAR | Status: DC
  Filled 2013-08-01: qty 20

## 2013-08-01 MED ORDER — SODIUM CHLORIDE 0.9 % IJ SOLN
INTRAMUSCULAR | Status: DC | PRN
  Administered 2013-08-01: 09:00:00

## 2013-08-01 MED ORDER — ZOLPIDEM TARTRATE 5 MG PO TABS
5.0000 mg | ORAL_TABLET | Freq: Every evening | ORAL | Status: DC | PRN
  Administered 2013-08-02 – 2013-08-05 (×5): 5 mg via ORAL
  Filled 2013-08-01 (×5): qty 1

## 2013-08-01 MED ORDER — LIDOCAINE HCL (PF) 1 % IJ SOLN
INTRAMUSCULAR | Status: AC
Start: 2013-08-01 — End: 2013-08-01
  Filled 2013-08-01: qty 5

## 2013-08-01 MED ORDER — ROCURONIUM BROMIDE 50 MG/5ML IV SOLN
INTRAVENOUS | Status: AC
  Filled 2013-08-01: qty 1

## 2013-08-01 MED ORDER — OXYCODONE-ACETAMINOPHEN 5-325 MG PO TABS: 1.0000 | ORAL_TABLET | ORAL | Status: DC | PRN

## 2013-08-01 MED ORDER — FENTANYL CITRATE 0.05 MG/ML IJ SOLN
INTRAMUSCULAR | Status: AC
  Filled 2013-08-01: qty 5

## 2013-08-01 MED ORDER — GLYCOPYRROLATE 0.2 MG/ML IJ SOLN
0.2000 mg | Freq: Once | INTRAMUSCULAR | Status: AC
  Administered 2013-08-01: 0.2 mg via INTRAVENOUS

## 2013-08-01 MED ORDER — ONDANSETRON HCL 4 MG/2ML IJ SOLN
4.0000 mg | Freq: Four times a day (QID) | INTRAMUSCULAR | Status: DC | PRN
  Administered 2013-08-05: 4 mg via INTRAVENOUS

## 2013-08-01 MED ORDER — ONDANSETRON HCL 4 MG/2ML IJ SOLN
4.0000 mg | Freq: Four times a day (QID) | INTRAMUSCULAR | Status: DC | PRN
  Filled 2013-08-01: qty 2

## 2013-08-01 MED ORDER — ALBUTEROL SULFATE (2.5 MG/3ML) 0.083% IN NEBU
INHALATION_SOLUTION | RESPIRATORY_TRACT | Status: AC
  Filled 2013-08-01: qty 3

## 2013-08-01 MED ORDER — METOPROLOL TARTRATE 1 MG/ML IV SOLN
INTRAVENOUS | Status: AC
  Filled 2013-08-01: qty 5

## 2013-08-01 MED ORDER — DIPHENHYDRAMINE HCL 12.5 MG/5ML PO ELIX
12.5000 mg | ORAL_SOLUTION | Freq: Four times a day (QID) | ORAL | Status: DC | PRN
  Administered 2013-08-03: 12.5 mg via ORAL
  Filled 2013-08-01: qty 5

## 2013-08-01 MED ORDER — KETOROLAC TROMETHAMINE 30 MG/ML IJ SOLN
30.0000 mg | Freq: Four times a day (QID) | INTRAMUSCULAR | Status: DC
  Administered 2013-08-01 – 2013-08-06 (×17): 30 mg via INTRAVENOUS
  Filled 2013-08-01 (×15): qty 1

## 2013-08-01 MED ORDER — ONDANSETRON HCL 4 MG PO TABS: 4.0000 mg | ORAL_TABLET | Freq: Four times a day (QID) | ORAL | Status: DC | PRN

## 2013-08-01 MED ORDER — MIDAZOLAM HCL 2 MG/2ML IJ SOLN
INTRAMUSCULAR | Status: AC
  Filled 2013-08-01: qty 2

## 2013-08-01 MED ORDER — FENTANYL CITRATE 0.05 MG/ML IJ SOLN
INTRAMUSCULAR | Status: AC
  Filled 2013-08-01: qty 2

## 2013-08-01 MED ORDER — HYDROMORPHONE 0.3 MG/ML IV SOLN
INTRAVENOUS | Status: DC
  Administered 2013-08-01: 12:00:00 via INTRAVENOUS
  Administered 2013-08-01: 1.5 mg via INTRAVENOUS
  Administered 2013-08-01: 2.4 mg via INTRAVENOUS
  Administered 2013-08-02: 0.3 mg via INTRAVENOUS
  Administered 2013-08-02 (×2): 0.9 mg via INTRAVENOUS
  Administered 2013-08-02: 1.8 mg via INTRAVENOUS
  Administered 2013-08-02: 23:00:00 via INTRAVENOUS
  Administered 2013-08-02: 0.9 mg via INTRAVENOUS
  Administered 2013-08-02: 2.7 mg via INTRAVENOUS
  Administered 2013-08-03: 0.9 mg via INTRAVENOUS
  Filled 2013-08-01 (×3): qty 25

## 2013-08-01 MED ORDER — ONDANSETRON HCL 4 MG/2ML IJ SOLN
4.0000 mg | Freq: Once | INTRAMUSCULAR | Status: AC
  Administered 2013-08-01: 4 mg via INTRAVENOUS

## 2013-08-01 MED ORDER — FENTANYL CITRATE 0.05 MG/ML IJ SOLN
INTRAMUSCULAR | Status: DC | PRN
  Administered 2013-08-01: 100 ug via INTRAVENOUS
  Administered 2013-08-01 (×3): 50 ug via INTRAVENOUS
  Administered 2013-08-01: 100 ug via INTRAVENOUS
  Administered 2013-08-01 (×6): 50 ug via INTRAVENOUS

## 2013-08-01 MED ORDER — CIPROFLOXACIN HCL 250 MG PO TABS
500.0000 mg | ORAL_TABLET | Freq: Two times a day (BID) | ORAL | Status: DC
  Administered 2013-08-01 – 2013-08-05 (×10): 500 mg via ORAL
  Filled 2013-08-01 (×10): qty 2

## 2013-08-01 MED ORDER — METOPROLOL TARTRATE 1 MG/ML IV SOLN
INTRAVENOUS | Status: DC | PRN
  Administered 2013-08-01 (×2): 1 mg via INTRAVENOUS

## 2013-08-01 MED ORDER — FENTANYL CITRATE 0.05 MG/ML IJ SOLN
25.0000 ug | INTRAMUSCULAR | Status: DC | PRN
  Administered 2013-08-01 (×2): 50 ug via INTRAVENOUS
  Filled 2013-08-01: qty 2

## 2013-08-01 MED ORDER — SODIUM CHLORIDE 0.9 % IJ SOLN: 9.0000 mL | INTRAMUSCULAR | Status: DC | PRN

## 2013-08-01 MED ORDER — ONDANSETRON HCL 4 MG/2ML IJ SOLN: 4.0000 mg | Freq: Once | INTRAMUSCULAR | Status: DC | PRN

## 2013-08-01 MED ORDER — LACTATED RINGERS IV SOLN
INTRAVENOUS | Status: DC
  Administered 2013-08-01 (×2): via INTRAVENOUS

## 2013-08-01 MED ORDER — DEXAMETHASONE SODIUM PHOSPHATE 4 MG/ML IJ SOLN
INTRAMUSCULAR | Status: AC
  Filled 2013-08-01: qty 1

## 2013-08-01 MED ORDER — ALBUTEROL SULFATE (2.5 MG/3ML) 0.083% IN NEBU
2.5000 mg | INHALATION_SOLUTION | Freq: Once | RESPIRATORY_TRACT | Status: AC
  Administered 2013-08-01: 2.5 mg via RESPIRATORY_TRACT

## 2013-08-01 MED ORDER — LIDOCAINE HCL 1 % IJ SOLN
INTRAMUSCULAR | Status: DC | PRN
  Administered 2013-08-01: 40 mg via INTRADERMAL

## 2013-08-01 MED ORDER — FENTANYL CITRATE 0.05 MG/ML IJ SOLN
25.0000 ug | INTRAMUSCULAR | Status: AC
  Administered 2013-08-01 (×2): 25 ug via INTRAVENOUS

## 2013-08-01 MED ORDER — DOCUSATE SODIUM 100 MG PO CAPS
100.0000 mg | ORAL_CAPSULE | Freq: Two times a day (BID) | ORAL | Status: DC
  Administered 2013-08-01 – 2013-08-03 (×5): 100 mg via ORAL
  Filled 2013-08-01 (×5): qty 1

## 2013-08-01 MED ORDER — ROCURONIUM BROMIDE 100 MG/10ML IV SOLN
INTRAVENOUS | Status: DC | PRN
  Administered 2013-08-01: 8 mg via INTRAVENOUS
  Administered 2013-08-01: 32 mg via INTRAVENOUS
  Administered 2013-08-01 (×3): 10 mg via INTRAVENOUS

## 2013-08-01 MED ORDER — MIDAZOLAM HCL 2 MG/2ML IJ SOLN
1.0000 mg | INTRAMUSCULAR | Status: DC | PRN
  Administered 2013-08-01: 2 mg via INTRAVENOUS

## 2013-08-01 MED ORDER — NEOSTIGMINE METHYLSULFATE 1 MG/ML IJ SOLN
INTRAMUSCULAR | Status: DC | PRN
  Administered 2013-08-01: 3.6 mg via INTRAVENOUS

## 2013-08-01 MED ORDER — OXYCODONE HCL 5 MG PO TABS: 5.0000 mg | ORAL_TABLET | ORAL | Status: AC | PRN

## 2013-08-01 MED ORDER — ONDANSETRON HCL 4 MG/2ML IJ SOLN
INTRAMUSCULAR | Status: AC
  Filled 2013-08-01: qty 2

## 2013-08-01 MED ORDER — NEOSTIGMINE METHYLSULFATE 1 MG/ML IJ SOLN
INTRAMUSCULAR | Status: AC
  Filled 2013-08-01: qty 1

## 2013-08-01 MED ORDER — SODIUM CHLORIDE 0.9 % IV BOLUS (SEPSIS)
1000.0000 mL | Freq: Once | INTRAVENOUS | Status: AC
  Administered 2013-08-01: 1000 mL via INTRAVENOUS

## 2013-08-01 MED ORDER — NALOXONE HCL 0.4 MG/ML IJ SOLN: 0.4000 mg | INTRAMUSCULAR | Status: DC | PRN

## 2013-08-01 MED ORDER — DIPHENHYDRAMINE HCL 50 MG/ML IJ SOLN
12.5000 mg | Freq: Four times a day (QID) | INTRAMUSCULAR | Status: DC | PRN
  Administered 2013-08-03: 12.5 mg via INTRAVENOUS
  Filled 2013-08-01: qty 1

## 2013-08-01 MED ORDER — SUCCINYLCHOLINE CHLORIDE 20 MG/ML IJ SOLN
INTRAMUSCULAR | Status: AC
  Filled 2013-08-01: qty 1

## 2013-08-01 MED ORDER — DEXTROSE-NACL 5-0.45 % IV SOLN
INTRAVENOUS | Status: DC
  Administered 2013-08-01: 23:00:00 via INTRAVENOUS
  Administered 2013-08-02: 125 mL/h via INTRAVENOUS

## 2013-08-01 MED ORDER — GLYCOPYRROLATE 0.2 MG/ML IJ SOLN
INTRAMUSCULAR | Status: AC
  Filled 2013-08-01: qty 3

## 2013-08-01 MED ORDER — GLYCOPYRROLATE 0.2 MG/ML IJ SOLN
INTRAMUSCULAR | Status: AC
  Filled 2013-08-01: qty 1

## 2013-08-01 MED ORDER — PANTOPRAZOLE SODIUM 40 MG PO TBEC
40.0000 mg | DELAYED_RELEASE_TABLET | Freq: Every day | ORAL | Status: DC
  Administered 2013-08-02 – 2013-08-05 (×4): 40 mg via ORAL
  Filled 2013-08-01 (×4): qty 1

## 2013-08-01 MED ORDER — KETOROLAC TROMETHAMINE 30 MG/ML IJ SOLN
30.0000 mg | Freq: Four times a day (QID) | INTRAMUSCULAR | Status: DC
  Filled 2013-08-01 (×3): qty 1

## 2013-08-01 MED ORDER — KETOROLAC TROMETHAMINE 30 MG/ML IJ SOLN: 30.0000 mg | Freq: Once | INTRAMUSCULAR | Status: DC

## 2013-08-01 MED ORDER — 0.9 % SODIUM CHLORIDE (POUR BTL) OPTIME
TOPICAL | Status: DC | PRN
  Administered 2013-08-01 (×2): 1000 mL

## 2013-08-01 SURGICAL SUPPLY — 60 items
APL SKNCLS STERI-STRIP NONHPOA (GAUZE/BANDAGES/DRESSINGS) ×2
BAG HAMPER (MISCELLANEOUS) ×4 IMPLANT
BENZOIN TINCTURE PRP APPL 2/3 (GAUZE/BANDAGES/DRESSINGS) ×2 IMPLANT
CLOSURE WOUND 1/2 X4 (GAUZE/BANDAGES/DRESSINGS) ×1
CLOTH BEACON ORANGE TIMEOUT ST (SAFETY) ×4 IMPLANT
COVER LIGHT HANDLE STERIS (MISCELLANEOUS) ×8 IMPLANT
DRAPE WARM FLUID 44X44 (DRAPE) ×4 IMPLANT
DRESSING TELFA 8X3 (GAUZE/BANDAGES/DRESSINGS) IMPLANT
DURAPREP 26ML APPLICATOR (WOUND CARE) ×4 IMPLANT
ELECT REM PT RETURN 9FT ADLT (ELECTROSURGICAL) ×4
ELECTRODE REM PT RTRN 9FT ADLT (ELECTROSURGICAL) ×2 IMPLANT
EVACUATOR DRAINAGE 10X20 100CC (DRAIN) IMPLANT
EVACUATOR SILICONE 100CC (DRAIN)
FORMALIN 10 PREFIL 480ML (MISCELLANEOUS) ×4 IMPLANT
GLOVE BIOGEL PI IND STRL 7.0 (GLOVE) IMPLANT
GLOVE BIOGEL PI IND STRL 9 (GLOVE) ×2 IMPLANT
GLOVE BIOGEL PI INDICATOR 7.0 (GLOVE) ×6
GLOVE BIOGEL PI INDICATOR 9 (GLOVE) ×2
GLOVE ECLIPSE 6.5 STRL STRAW (GLOVE) ×4 IMPLANT
GLOVE ECLIPSE 9.0 STRL (GLOVE) ×4 IMPLANT
GLOVE EXAM NITRILE MD LF STRL (GLOVE) ×4 IMPLANT
GLOVE SS BIOGEL STRL SZ 6.5 (GLOVE) IMPLANT
GLOVE SUPERSENSE BIOGEL SZ 6.5 (GLOVE) ×2
GOWN SPEC L3 XXLG W/TWL (GOWN DISPOSABLE) ×4 IMPLANT
GOWN STRL REUS W/ TWL LRG LVL3 (GOWN DISPOSABLE) IMPLANT
GOWN STRL REUS W/TWL LRG LVL3 (GOWN DISPOSABLE) ×18 IMPLANT
INST SET MAJOR GENERAL (KITS) ×4 IMPLANT
KIT ROOM TURNOVER APOR (KITS) ×4 IMPLANT
MANIFOLD NEPTUNE II (INSTRUMENTS) ×4 IMPLANT
NDL HYPO 25X1 1.5 SAFETY (NEEDLE) IMPLANT
NEEDLE HYPO 25X1 1.5 SAFETY (NEEDLE) ×4 IMPLANT
NS IRRIG 1000ML POUR BTL (IV SOLUTION) ×8 IMPLANT
PACK ABDOMINAL MAJOR (CUSTOM PROCEDURE TRAY) ×4 IMPLANT
PAD ARMBOARD 7.5X6 YLW CONV (MISCELLANEOUS) ×4 IMPLANT
RETRACTOR WND ALEXIS 25 LRG (MISCELLANEOUS) IMPLANT
RTRCTR WOUND ALEXIS 25CM LRG (MISCELLANEOUS)
SET BASIN LINEN APH (SET/KITS/TRAYS/PACK) ×4 IMPLANT
SPONGE DRAIN TRACH 4X4 STRL 2S (GAUZE/BANDAGES/DRESSINGS) IMPLANT
SPONGE GAUZE 4X4 12PLY (GAUZE/BANDAGES/DRESSINGS) ×2 IMPLANT
SPONGE LAP 18X18 X RAY DECT (DISPOSABLE) IMPLANT
STAPLER VISISTAT 35W (STAPLE) IMPLANT
STRIP CLOSURE SKIN 1/2X4 (GAUZE/BANDAGES/DRESSINGS) ×1 IMPLANT
SUT CHROMIC 0 CT 1 (SUTURE) ×40 IMPLANT
SUT CHROMIC 2 0 CT 1 (SUTURE) ×8 IMPLANT
SUT CHROMIC GUT BROWN 0 54 (SUTURE) IMPLANT
SUT CHROMIC GUT BROWN 0 54IN (SUTURE)
SUT ETHILON 3 0 FSL (SUTURE) IMPLANT
SUT PDS AB CT VIOLET #0 27IN (SUTURE) ×4 IMPLANT
SUT PLAIN CT 1/2CIR 2-0 27IN (SUTURE) ×6 IMPLANT
SUT PROLENE 0 CT 1 30 (SUTURE) IMPLANT
SUT VIC AB 0 CT1 27 (SUTURE) ×8
SUT VIC AB 0 CT1 27XBRD ANTBC (SUTURE) IMPLANT
SUT VICRYL 4 0 KS 27 (SUTURE) ×4 IMPLANT
SUT VICRYL AB 2 0 TIES (SUTURE) ×4 IMPLANT
SYR 20CC LL (SYRINGE) ×2 IMPLANT
SYR 30ML LL (SYRINGE) ×2 IMPLANT
SYRINGE 10CC LL (SYRINGE) ×2 IMPLANT
TOWEL BLUE STERILE X RAY DET (MISCELLANEOUS) ×4 IMPLANT
TOWEL OR 17X26 4PK STRL BLUE (TOWEL DISPOSABLE) ×4 IMPLANT
TRAY FOLEY CATH 16FR SILVER (SET/KITS/TRAYS/PACK) ×4 IMPLANT

## 2013-08-01 NOTE — Progress Notes (Signed)
Day of Surgery Procedure(s) (LRB): HYSTERECTOMY SUPRACERVICAL ABDOMINAL (N/A) BILATERAL SALPINGECTOMY (Bilateral)  Subjective: Patient reports nausea and incisional pain.  Felt nauseous earlier, and that seems better. Pain present , adequate control.  Objective: I have reviewed patient's vital signs, intake and output and medications. BP 95/63  Pulse 108  Temp(Src) 98.5 F (36.9 C) (Oral)  Resp 15  Ht 5\' 4"  (1.626 m)  Wt 241 lb (109.317 kg)  BMI 41.35 kg/m2  SpO2 96%  LMP 04/15/2013 Pt urine output is diminshed and pulse increased , found to be on too low an IV infusion, kvo, correction order by NS fluid bolus x 1000 cc, then 125cc / hr and will also check cbc now  Intake/Output Summary (Last 24 hours) at 08/01/13 1915 Last data filed at 08/01/13 0953  Gross per 24 hour  Intake   2000 ml  Output    330 ml  Net   1670 ml   Physical Examination: General appearance - alert, well appearing, and in no distress and mild discomfort, Abdomen - no rebound tenderness noted, bowel sounds present but hypoactive.                  -  bowel sounds hypoactive    Assessment: s/p Procedure(s): HYSTERECTOMY SUPRACERVICAL ABDOMINAL (N/A) BILATERAL SALPINGECTOMY (Bilateral):  found to be on too low an IV infusion, kvo, correction order by NS fluid bolus x 1000 cc, then 125cc / hr and will also check cbc now  Plan: fluid bolus cbc, then 125 cc/hr.  LOS: 0 days    Geary Rufo V 08/01/2013, 6:58 PM

## 2013-08-01 NOTE — Op Note (Signed)
08/01/2013  9:40 AM  PATIENT:  Robin Sherman  53 y.o. female  PRE-OPERATIVE DIAGNOSIS:  Fibroid uterine enlargement 12 weeks; Abnormal bleeding due to fibroids; Bladder pressure from fibroid  POST-OPERATIVE DIAGNOSIS:  Fibroid uterine enlargement 12 weeks; Abnormal bleeding due to fibroids; Bladder pressure from fibroid  PROCEDURE:  Procedure(s): HYSTERECTOMY SUPRACERVICAL ABDOMINAL (N/A) BILATERAL SALPINGECTOMY (Bilateral)  SURGEON:  Surgeon(s) and Role:    * Jonnie Kind, MD - Primary  Details of procedure:. Patient was taken operating room prepped and draped for lower surgery with a midline vertical incision performed. Timeout Was conducted and Ancef administered.. Midline vertical incision was repeated, with peritoneal cavity entered without difficulty. Bowel was packed away and Balfour retractor positioned. The uterus had a very single elongate fibroid uterus growing off the posterior fundus. Round ligaments could be identified clamped cut and suture ligated bilaterally. The left utero-ovarian ligament could be clamped cut and suture ligated with Kelly clamps, 0 chromic and hemostasis was good. Bladder flap was developed anteriorly, minimal dissection required, then upper cardinal ligaments on the right clamped cut and suture ligated using straight Heaney clamp and knife dissection with 0 chromic suture. The right side was treated similarly. At this point the uterus could be rotated off of the lower uterine segment for improved visibility, then the lower uterine segment inspected and an additional 1 cm away for off the lower uterine segment tissues excised and sent as a specimen as well. Cuff was oversewn with interrupted 0 chromic x3, hemostasis confirmed. Point cautery used as necessary on the cervical stump, then the pelvis irrigated hemostasis confirmed laparotomy equipment removed, and your peritoneum closed with running 2-0 Vicryl, followed by running 0 PDS closure the fascial layer.  Subcutaneous tissues were irrigated and inspected confirmed as hemostatic, then reapproximated with 4 interrupted sutures horizontal mattress 20 plain followed by subcuticular 4-0 Vicryl skin closure. Exparel in the skin and subcutaneous tissues down to the level of the fascia, 40 cc total was injected Sponge and needle counts correct

## 2013-08-01 NOTE — Transfer of Care (Signed)
Immediate Anesthesia Transfer of Care Note  Patient: Robin Sherman  Procedure(s) Performed: Procedure(s): HYSTERECTOMY SUPRACERVICAL ABDOMINAL (N/A) BILATERAL SALPINGECTOMY (Bilateral)  Patient Location: PACU  Anesthesia Type:General  Level of Consciousness: awake, alert  and oriented  Airway & Oxygen Therapy: Patient Spontanous Breathing and Patient connected to face mask oxygen  Post-op Assessment: Report given to PACU RN  Post vital signs: Reviewed and stable  Complications: No apparent anesthesia complications

## 2013-08-01 NOTE — Anesthesia Postprocedure Evaluation (Signed)
  Anesthesia Post-op Note  Patient: Robin Sherman  Procedure(s) Performed: Procedure(s): HYSTERECTOMY SUPRACERVICAL ABDOMINAL (N/A) BILATERAL SALPINGECTOMY (Bilateral)  Patient Location: PACU  Anesthesia Type:General  Level of Consciousness: awake and alert   Airway and Oxygen Therapy: Patient Spontanous Breathing and Patient connected to face mask oxygen  Post-op Pain: moderate  Post-op Assessment: Post-op Vital signs reviewed, Patient's Cardiovascular Status Stable, Respiratory Function Stable, Patent Airway and No signs of Nausea or vomiting  Post-op Vital Signs: Reviewed and stable  Complications: No apparent anesthesia complications

## 2013-08-01 NOTE — Brief Op Note (Signed)
08/01/2013  9:40 AM  PATIENT:  Robin Sherman  53 y.o. female  PRE-OPERATIVE DIAGNOSIS:  Fibroid uterine enlargement 12 weeks; Abnormal bleeding due to fibroids; Bladder pressure from fibroid  POST-OPERATIVE DIAGNOSIS:  Fibroid uterine enlargement 12 weeks; Abnormal bleeding due to fibroids; Bladder pressure from fibroid  PROCEDURE:  Procedure(s): HYSTERECTOMY SUPRACERVICAL ABDOMINAL (N/A) BILATERAL SALPINGECTOMY (Bilateral)  SURGEON:  Surgeon(s) and Role:    * Jonnie Kind, MD - Primary  PHYSICIAN ASSISTANT:   ASSISTANTS: [place] RN-FA   ANESTHESIA:   local and general  EBL:  Total I/O In: 1000 [I.V.:1000] Out: 330 [Urine:230; Blood:100]  BLOOD ADMINISTERED:none  DRAINS: Urinary Catheter (Foley)   LOCAL MEDICATIONS USED:  Amount: 20 ml and OTHER Exparel  SPECIMEN:  Source of Specimen:  Uterus without cervix, fallopian tubes  DISPOSITION OF SPECIMEN:  PATHOLOGY  COUNTS:  YES  TOURNIQUET:  * No tourniquets in log *  DICTATION: .Dragon Dictation  PLAN OF CARE: Admit to inpatient   PATIENT DISPOSITION:  PACU - hemodynamically stable.   Delay start of Pharmacological VTE agent (>24hrs) due to surgical blood loss or risk of bleeding: not applicable

## 2013-08-01 NOTE — Interval H&P Note (Signed)
History and Physical Interval Note:  08/01/2013 7:17 AM  Robin Sherman  has presented today for surgery, with the diagnosis of fibroids  The various methods of treatment have been discussed with the patient and family. After consideration of risks, benefits and other options for treatment, the patient has consented to  Procedure(s): HYSTERECTOMY SUPRACERVICAL ABDOMINAL (N/A) BILATERAL SALPINGECTOMY (Bilateral) as a surgical intervention .  The patient's history has been reviewed, patient examined, no change in status, stable for surgery.  I have reviewed the patient's chart and labs.  Questions were answered to the patient's satisfaction.   We have again confirmed that the Pt can receive blood if required, and since she has a prior vertical abdominal incision from prior cesarean, we will use the same incision.   Jonnie Kind

## 2013-08-01 NOTE — Anesthesia Preprocedure Evaluation (Addendum)
Anesthesia Evaluation  Patient identified by MRN, date of birth, ID band Patient awake    Reviewed: Allergy & Precautions, H&P , NPO status , Patient's Chart, lab work & pertinent test results  Airway Mallampati: III TM Distance: >3 FB     Dental  (+) Teeth Intact   Pulmonary asthma , Current Smoker,  breath sounds clear to auscultation        Cardiovascular hypertension, Pt. on medications Rhythm:Regular Rate:Normal     Neuro/Psych  Headaches,    GI/Hepatic negative GI ROS,   Endo/Other    Renal/GU      Musculoskeletal   Abdominal   Peds  Hematology   Anesthesia Other Findings   Reproductive/Obstetrics                          Anesthesia Physical Anesthesia Plan  ASA: II  Anesthesia Plan: General   Post-op Pain Management:    Induction: Intravenous  Airway Management Planned: Oral ETT  Additional Equipment:   Intra-op Plan:   Post-operative Plan: Extubation in OR  Informed Consent: I have reviewed the patients History and Physical, chart, labs and discussed the procedure including the risks, benefits and alternatives for the proposed anesthesia with the patient or authorized representative who has indicated his/her understanding and acceptance.     Plan Discussed with:   Anesthesia Plan Comments:        Anesthesia Quick Evaluation

## 2013-08-01 NOTE — Anesthesia Procedure Notes (Signed)
Procedure Name: Intubation Date/Time: 08/01/2013 7:51 AM Performed by: Tressie Stalker E Pre-anesthesia Checklist: Patient identified, Patient being monitored, Timeout performed, Emergency Drugs available and Suction available Patient Re-evaluated:Patient Re-evaluated prior to inductionOxygen Delivery Method: Circle System Utilized Preoxygenation: Pre-oxygenation with 100% oxygen Intubation Type: IV induction Ventilation: Mask ventilation without difficulty Laryngoscope Size: Mac and 3 Grade View: Grade I Tube type: Oral Tube size: 7.0 mm Number of attempts: 1 Airway Equipment and Method: stylet Placement Confirmation: ETT inserted through vocal cords under direct vision,  positive ETCO2 and breath sounds checked- equal and bilateral Secured at: 21 cm Tube secured with: Tape Dental Injury: Teeth and Oropharynx as per pre-operative assessment

## 2013-08-01 NOTE — Progress Notes (Signed)
UR chart review completed.  

## 2013-08-02 LAB — CBC
HEMATOCRIT: 26.2 % — AB (ref 36.0–46.0)
HEMATOCRIT: 24.5 % — AB (ref 36.0–46.0)
HEMOGLOBIN: 8.4 g/dL — AB (ref 12.0–15.0)
Hemoglobin: 8.3 g/dL — ABNORMAL LOW (ref 12.0–15.0)
MCH: 27.9 pg (ref 26.0–34.0)
MCH: 29.6 pg (ref 26.0–34.0)
MCHC: 30.5 g/dL (ref 30.0–36.0)
MCHC: 33.9 g/dL (ref 30.0–36.0)
MCV: 91.3 fL (ref 78.0–100.0)
MCV: 87.5 fL (ref 78.0–100.0)
Platelets: 347 10*3/uL (ref 150–400)
Platelets: 292 10*3/uL (ref 150–400)
RBC: 2.87 MIL/uL — AB (ref 3.87–5.11)
RBC: 2.8 MIL/uL — AB (ref 3.87–5.11)
RDW: 14.7 % (ref 11.5–15.5)
RDW: 14.6 % (ref 11.5–15.5)
WBC: 17.2 10*3/uL — ABNORMAL HIGH (ref 4.0–10.5)
WBC: 13.8 10*3/uL — ABNORMAL HIGH (ref 4.0–10.5)

## 2013-08-02 LAB — PREPARE RBC (CROSSMATCH)

## 2013-08-02 MED ORDER — DEXTROSE-NACL 5-0.9 % IV SOLN
INTRAVENOUS | Status: DC
  Administered 2013-08-02: 75 mL/h via INTRAVENOUS

## 2013-08-02 NOTE — Anesthesia Postprocedure Evaluation (Signed)
  Anesthesia Post-op Note  Patient: Robin Sherman  Procedure(s) Performed: Procedure(s): HYSTERECTOMY SUPRACERVICAL ABDOMINAL (N/A) BILATERAL SALPINGECTOMY (Bilateral)  Patient Location: room 301  Anesthesia Type:General  Level of Consciousness: awake, alert , oriented and patient cooperative  Airway and Oxygen Therapy: Patient Spontanous Breathing and Patient connected to nasal cannula oxygen  Post-op Pain: 3 /10, mild  Post-op Assessment: Post-op Vital signs reviewed, Patient's Cardiovascular Status Stable, Respiratory Function Stable, Patent Airway and Pain level controlled  Post-op Vital Signs: Reviewed and stable  Complications: No apparent anesthesia complications

## 2013-08-02 NOTE — Progress Notes (Signed)
1 Day Post-Op Procedure(s) (LRB): HYSTERECTOMY SUPRACERVICAL ABDOMINAL (N/A) BILATERAL SALPINGECTOMY (Bilateral)  Pt is hungry. I have reviewed below results with pt and she is comfortable with plan to follow for now. No plans for surgical reexploration at present.  Objective: I have reviewed patient's vital signs and labs.  BP 114/72  Pulse 93  Temp(Src) 98.7 F (37.1 C) (Oral)  Resp 18  Ht 5\' 4"  (1.626 m)  Wt 241 lb (109.317 kg)  BMI 41.35 kg/m2  SpO2 99%  LMP 04/15/2013 CBC    Component Value Date/Time   WBC 13.8* 08/02/2013 1103   RBC 2.80* 08/02/2013 1103   HGB 8.3* 08/02/2013 1103   HCT 24.5* 08/02/2013 1103   PLT 292 08/02/2013 1103   MCV 87.5 08/02/2013 1103   MCH 29.6 08/02/2013 1103   MCHC 33.9 08/02/2013 1103   RDW 14.6 08/02/2013 1103   LYMPHSABS 2.5 03/18/2011 1255   MONOABS 0.7 03/18/2011 1255   EOSABS 0.4 03/18/2011 1255   BASOSABS 0.0 03/18/2011 1255       Assessment: s/p Procedure(s): HYSTERECTOMY SUPRACERVICAL ABDOMINAL (N/A) BILATERAL SALPINGECTOMY (Bilateral): stable, anemia and postop bleeding , stable at present.  Plan: Advance diet discontinue foley. Rx Cipro, as review of preop labs : culture has returned positive for a UTI that preceded the surgery. Cbc in am.  LOS: 1 day    Franciszek Platten V 08/02/2013, 1:19 PM

## 2013-08-02 NOTE — Progress Notes (Signed)
Notified Dr. Glo Herring that I held her Norvasc this am due to her BP being on the low side.  He verbalized understanding and voiced that it was good I held it.

## 2013-08-02 NOTE — Progress Notes (Signed)
1 Day Post-Op Procedure(s) (LRB): HYSTERECTOMY SUPRACERVICAL ABDOMINAL (N/A) BILATERAL SALPINGECTOMY (Bilateral)  Subjective: Patient reports nausea, incisional pain and tolerating PO.    Objective: I have reviewed patient's vital signs, intake and output and labs. Pt now has adequate urine output, clear , dilute, 400 cc/8 hr.  Pt hgb had significant drop overnight, was 12.7 preop, 10.0 at 7 pm , and after rehydration overnight has reached 8.4 at 5 am. I feel this last shift represents rehydration, but pt obviously had intraabdominal bleeding , given ebl at surgery 100 cc.    General: alert, cooperative, no distress and abdominal fullness present, GI: abdominal fullness, soft, no guarding or rebound,  Extremities: extremities normal, atraumatic, no cyanosis or edema and Homans sign is negative, no sign of DVT Vaginal Bleeding: none  Assessment: Postoperative blood loss s/p supracervical hysterectomy,  Suspect intraabdominal blood loss. Will recheck cbc in 6 hrs, if hgb declines further will require transfusion and reexploration to rule out continued bleeding. ] Pt just ate reg diet, will make NPO. s/p Procedure(s): HYSTERECTOMY SUPRACERVICAL ABDOMINAL (N/A) BILATERAL SALPINGECTOMY (Bilateral): postoperative blood loss, currently felt to be stabilized, pending repeat cbc  Plan: Continue foley due to strict I&O NPO  LOS: 1 day    Jules Vidovich V 08/02/2013, 9:15 AM

## 2013-08-02 NOTE — Plan of Care (Signed)
Problem: Phase I Progression Outcomes Goal: Voiding-avoid urinary catheter unless indicated Outcome: Progressing Foley d/c'd today.

## 2013-08-02 NOTE — Plan of Care (Signed)
Problem: Phase I Progression Outcomes Goal: Incision/dressings dry and intact Outcome: Progressing The drg continues to be intact and marked.

## 2013-08-03 ENCOUNTER — Inpatient Hospital Stay (HOSPITAL_COMMUNITY): Payer: BC Managed Care – PPO | Admitting: Anesthesiology

## 2013-08-03 ENCOUNTER — Other Ambulatory Visit: Payer: Self-pay | Admitting: Obstetrics and Gynecology

## 2013-08-03 ENCOUNTER — Encounter (HOSPITAL_COMMUNITY)
Admission: RE | Disposition: A | Discharge status: Home / Self Care | Payer: Self-pay | Source: Ambulatory Visit | Attending: Obstetrics and Gynecology

## 2013-08-03 ENCOUNTER — Encounter (HOSPITAL_COMMUNITY): Payer: BC Managed Care – PPO | Admitting: Anesthesiology

## 2013-08-03 ENCOUNTER — Encounter (HOSPITAL_COMMUNITY): Payer: Self-pay | Admitting: *Deleted

## 2013-08-03 DIAGNOSIS — IMO0002 Reserved for concepts with insufficient information to code with codable children: Secondary | ICD-10-CM

## 2013-08-03 HISTORY — PX: HEMATOMA EVACUATION: SHX5118

## 2013-08-03 LAB — CBC
HCT: 22.8 % — ABNORMAL LOW (ref 36.0–46.0)
Hemoglobin: 7.7 g/dL — ABNORMAL LOW (ref 12.0–15.0)
MCH: 29.8 pg (ref 26.0–34.0)
MCHC: 33.8 g/dL (ref 30.0–36.0)
MCV: 88.4 fL (ref 78.0–100.0)
PLATELETS: 275 10*3/uL (ref 150–400)
RBC: 2.58 MIL/uL — AB (ref 3.87–5.11)
RDW: 14.8 % (ref 11.5–15.5)
WBC: 9.7 10*3/uL (ref 4.0–10.5)

## 2013-08-03 LAB — CBC WITH DIFFERENTIAL/PLATELET
Basophils Absolute: 0.1 10*3/uL (ref 0.0–0.1)
Basophils Relative: 0 % (ref 0–1)
Eosinophils Absolute: 0.4 10*3/uL (ref 0.0–0.7)
Eosinophils Relative: 3 % (ref 0–5)
HCT: 30.5 % — ABNORMAL LOW (ref 36.0–46.0)
HEMOGLOBIN: 10.5 g/dL — AB (ref 12.0–15.0)
LYMPHS ABS: 3 10*3/uL (ref 0.7–4.0)
LYMPHS PCT: 25 % (ref 12–46)
MCH: 29.4 pg (ref 26.0–34.0)
MCHC: 34.4 g/dL (ref 30.0–36.0)
MCV: 85.4 fL (ref 78.0–100.0)
MONOS PCT: 9 % (ref 3–12)
Monocytes Absolute: 1.1 10*3/uL — ABNORMAL HIGH (ref 0.1–1.0)
Neutro Abs: 7.6 10*3/uL (ref 1.7–7.7)
Neutrophils Relative %: 63 % (ref 43–77)
PLATELETS: 283 10*3/uL (ref 150–400)
RBC: 3.57 MIL/uL — AB (ref 3.87–5.11)
RDW: 14.9 % (ref 11.5–15.5)
WBC: 12.2 10*3/uL — AB (ref 4.0–10.5)

## 2013-08-03 LAB — BASIC METABOLIC PANEL
BUN: 10 mg/dL (ref 6–23)
CALCIUM: 8.4 mg/dL (ref 8.4–10.5)
CO2: 27 meq/L (ref 19–32)
Chloride: 105 mEq/L (ref 96–112)
Creatinine, Ser: 0.75 mg/dL (ref 0.50–1.10)
GFR calc Af Amer: >90 mL/min (ref >90)
GFR calc non Af Amer: >90 mL/min (ref >90)
Glucose, Bld: 106 mg/dL — ABNORMAL HIGH (ref 70–99)
Potassium: 4.8 mEq/L (ref 3.7–5.3)
SODIUM: 140 meq/L (ref 137–147)

## 2013-08-03 LAB — PREPARE RBC (CROSSMATCH)

## 2013-08-03 SURGERY — EVACUATION HEMATOMA
Anesthesia: General | Site: Abdomen

## 2013-08-03 MED ORDER — ROCURONIUM BROMIDE 100 MG/10ML IV SOLN
INTRAVENOUS | Status: DC | PRN
  Administered 2013-08-03: 25 mg via INTRAVENOUS

## 2013-08-03 MED ORDER — PROPOFOL 10 MG/ML IV EMUL
INTRAVENOUS | Status: AC
  Filled 2013-08-03: qty 20

## 2013-08-03 MED ORDER — LACTATED RINGERS IV SOLN
INTRAVENOUS | Status: DC | PRN
  Administered 2013-08-03 (×2): via INTRAVENOUS

## 2013-08-03 MED ORDER — 0.9 % SODIUM CHLORIDE (POUR BTL) OPTIME
TOPICAL | Status: DC | PRN
  Administered 2013-08-03 (×2): 1000 mL

## 2013-08-03 MED ORDER — FENTANYL CITRATE 0.05 MG/ML IJ SOLN
INTRAMUSCULAR | Status: AC
Start: 2013-08-03 — End: 2013-08-03
  Filled 2013-08-03: qty 5

## 2013-08-03 MED ORDER — HYDROMORPHONE HCL PF 1 MG/ML IJ SOLN
INTRAMUSCULAR | Status: AC
  Filled 2013-08-03: qty 1

## 2013-08-03 MED ORDER — MIDAZOLAM HCL 2 MG/2ML IJ SOLN
INTRAMUSCULAR | Status: AC
  Filled 2013-08-03: qty 2

## 2013-08-03 MED ORDER — GLYCOPYRROLATE 0.2 MG/ML IJ SOLN
INTRAMUSCULAR | Status: AC
  Filled 2013-08-03: qty 1

## 2013-08-03 MED ORDER — LIDOCAINE HCL 1 % IJ SOLN
INTRAMUSCULAR | Status: DC | PRN
  Administered 2013-08-03: 50 mg via INTRADERMAL

## 2013-08-03 MED ORDER — KETOROLAC TROMETHAMINE 30 MG/ML IJ SOLN
30.0000 mg | Freq: Once | INTRAMUSCULAR | Status: AC
  Administered 2013-08-03: 30 mg via INTRAVENOUS

## 2013-08-03 MED ORDER — DEXTROSE-NACL 5-0.45 % IV SOLN
INTRAVENOUS | Status: DC
  Administered 2013-08-03: 125 mL/h via INTRAVENOUS
  Administered 2013-08-04 – 2013-08-05 (×3): via INTRAVENOUS

## 2013-08-03 MED ORDER — GLYCOPYRROLATE 0.2 MG/ML IJ SOLN
INTRAMUSCULAR | Status: DC | PRN
  Administered 2013-08-03 (×2): 0.2 mg via INTRAVENOUS

## 2013-08-03 MED ORDER — ONDANSETRON HCL 4 MG/2ML IJ SOLN
INTRAMUSCULAR | Status: AC
  Filled 2013-08-03: qty 2

## 2013-08-03 MED ORDER — LABETALOL HCL 5 MG/ML IV SOLN
INTRAVENOUS | Status: AC
  Filled 2013-08-03: qty 4

## 2013-08-03 MED ORDER — NEOSTIGMINE METHYLSULFATE 1 MG/ML IJ SOLN
INTRAMUSCULAR | Status: DC | PRN
  Administered 2013-08-03 (×2): 1 mg via INTRAVENOUS

## 2013-08-03 MED ORDER — FENTANYL CITRATE 0.05 MG/ML IJ SOLN
INTRAMUSCULAR | Status: DC | PRN
  Administered 2013-08-03 (×2): 50 ug via INTRAVENOUS
  Administered 2013-08-03: 150 ug via INTRAVENOUS

## 2013-08-03 MED ORDER — HYDROMORPHONE HCL PF 1 MG/ML IJ SOLN
1.0000 mg | INTRAMUSCULAR | Status: DC | PRN
  Administered 2013-08-03: 1 mg via INTRAVENOUS
  Filled 2013-08-03: qty 1

## 2013-08-03 MED ORDER — SUCCINYLCHOLINE CHLORIDE 20 MG/ML IJ SOLN
INTRAMUSCULAR | Status: DC | PRN
  Administered 2013-08-03: 150 mg via INTRAVENOUS

## 2013-08-03 MED ORDER — HEMOSTATIC AGENTS (NO CHARGE) OPTIME
TOPICAL | Status: DC | PRN
  Administered 2013-08-03: 1 via TOPICAL

## 2013-08-03 MED ORDER — ROCURONIUM BROMIDE 50 MG/5ML IV SOLN
INTRAVENOUS | Status: AC
  Filled 2013-08-03: qty 1

## 2013-08-03 MED ORDER — HYDROMORPHONE HCL PF 1 MG/ML IJ SOLN
1.0000 mg | INTRAMUSCULAR | Status: DC | PRN
  Administered 2013-08-03 – 2013-08-05 (×7): 1 mg via INTRAVENOUS
  Filled 2013-08-03 (×6): qty 1

## 2013-08-03 MED ORDER — CEFAZOLIN SODIUM-DEXTROSE 2-3 GM-% IV SOLR
2.0000 g | INTRAVENOUS | Status: DC
  Filled 2013-08-03: qty 50

## 2013-08-03 MED ORDER — SUCCINYLCHOLINE CHLORIDE 20 MG/ML IJ SOLN
INTRAMUSCULAR | Status: AC
Start: 2013-08-03 — End: 2013-08-03
  Filled 2013-08-03: qty 1

## 2013-08-03 MED ORDER — CEFAZOLIN SODIUM-DEXTROSE 2-3 GM-% IV SOLR
INTRAVENOUS | Status: DC | PRN
Start: 2013-08-03 — End: 2013-08-03
  Administered 2013-08-03: 2 g via INTRAVENOUS

## 2013-08-03 MED ORDER — MIDAZOLAM HCL 5 MG/5ML IJ SOLN
INTRAMUSCULAR | Status: DC | PRN
  Administered 2013-08-03: 2 mg via INTRAVENOUS

## 2013-08-03 MED ORDER — SODIUM CHLORIDE 0.9 % IV SOLN
INTRAVENOUS | Status: DC
  Administered 2013-08-03: 07:00:00 via INTRAVENOUS

## 2013-08-03 MED ORDER — ONDANSETRON HCL 4 MG/2ML IJ SOLN
INTRAMUSCULAR | Status: DC | PRN
  Administered 2013-08-03: 4 mg via INTRAVENOUS

## 2013-08-03 MED ORDER — KETOROLAC TROMETHAMINE 30 MG/ML IJ SOLN
INTRAMUSCULAR | Status: AC
  Filled 2013-08-03: qty 1

## 2013-08-03 MED ORDER — PROPOFOL 10 MG/ML IV BOLUS
INTRAVENOUS | Status: DC | PRN
  Administered 2013-08-03: 150 mg via INTRAVENOUS

## 2013-08-03 MED ORDER — OXYCODONE HCL 5 MG PO TABS: 5.0000 mg | ORAL_TABLET | ORAL | Status: DC | PRN

## 2013-08-03 SURGICAL SUPPLY — 49 items
APL SKNCLS STERI-STRIP NONHPOA (GAUZE/BANDAGES/DRESSINGS) ×1
BAG HAMPER (MISCELLANEOUS) ×3 IMPLANT
BENZOIN TINCTURE PRP APPL 2/3 (GAUZE/BANDAGES/DRESSINGS) ×2 IMPLANT
CLOSURE WOUND 1/2 X4 (GAUZE/BANDAGES/DRESSINGS) ×1
CLOTH BEACON ORANGE TIMEOUT ST (SAFETY) ×3 IMPLANT
COVER LIGHT HANDLE STERIS (MISCELLANEOUS) ×6 IMPLANT
DECANTER SPIKE VIAL GLASS SM (MISCELLANEOUS) ×3 IMPLANT
DRESSING TELFA 8X3 (GAUZE/BANDAGES/DRESSINGS) ×1 IMPLANT
ELECT REM PT RETURN 9FT ADLT (ELECTROSURGICAL) ×3
ELECTRODE REM PT RTRN 9FT ADLT (ELECTROSURGICAL) ×1 IMPLANT
EVACUATOR DRAINAGE 10X20 100CC (DRAIN) ×1 IMPLANT
EVACUATOR SILICONE 100CC (DRAIN)
GLOVE BIOGEL PI IND STRL 7.0 (GLOVE) IMPLANT
GLOVE BIOGEL PI INDICATOR 7.0 (GLOVE) ×4
GLOVE ECLIPSE 9.0 STRL (GLOVE) ×3 IMPLANT
GLOVE EXAM NITRILE MD LF STRL (GLOVE) ×2 IMPLANT
GLOVE INDICATOR STER SZ 9 (GLOVE) ×3 IMPLANT
GLOVE SS BIOGEL STRL SZ 6.5 (GLOVE) IMPLANT
GLOVE SUPERSENSE BIOGEL SZ 6.5 (GLOVE) ×2
GOWN SPEC L3 XXLG W/TWL (GOWN DISPOSABLE) ×3 IMPLANT
GOWN STRL REUS W/TWL LRG LVL3 (GOWN DISPOSABLE) ×3 IMPLANT
HEMOSTAT SURGICEL 4X8 (HEMOSTASIS) ×2 IMPLANT
INST SET MAJOR GENERAL (KITS) ×3 IMPLANT
KIT REMOVER STAPLE SKIN (MISCELLANEOUS) ×1 IMPLANT
KIT ROOM TURNOVER APOR (KITS) ×3 IMPLANT
MANIFOLD NEPTUNE II (INSTRUMENTS) ×3 IMPLANT
NS IRRIG 1000ML POUR BTL (IV SOLUTION) ×5 IMPLANT
PACK ABDOMINAL MAJOR (CUSTOM PROCEDURE TRAY) ×3 IMPLANT
PAD ARMBOARD 7.5X6 YLW CONV (MISCELLANEOUS) ×3 IMPLANT
SET BASIN LINEN APH (SET/KITS/TRAYS/PACK) ×3 IMPLANT
SOL PREP PROV IODINE SCRUB 4OZ (MISCELLANEOUS) ×3 IMPLANT
SPONGE GAUZE 4X4 12PLY (GAUZE/BANDAGES/DRESSINGS) ×3 IMPLANT
SPONGE LAP 18X18 X RAY DECT (DISPOSABLE) ×2 IMPLANT
SPONGE SURGIFOAM ABS GEL 100 (HEMOSTASIS) ×2 IMPLANT
STAPLER VISISTAT 35W (STAPLE) ×1 IMPLANT
STRIP CLOSURE SKIN 1/2X4 (GAUZE/BANDAGES/DRESSINGS) ×1 IMPLANT
SUT CHROMIC 0 CT 1 (SUTURE) ×4 IMPLANT
SUT CHROMIC 2 0 CT 1 (SUTURE) IMPLANT
SUT ETHILON 3 0 FSL (SUTURE) ×1 IMPLANT
SUT PDS AB CT VIOLET #0 27IN (SUTURE) ×2 IMPLANT
SUT PLAIN CT 1/2CIR 2-0 27IN (SUTURE) ×6 IMPLANT
SUT PROLENE 0 CT 1 30 (SUTURE) IMPLANT
SUT VIC AB 0 CT1 27 (SUTURE)
SUT VIC AB 0 CT1 27XBRD ANTBC (SUTURE) IMPLANT
SUT VIC AB 2-0 CT2 27 (SUTURE) ×2 IMPLANT
SUT VIC AB 3-0 SH 27 (SUTURE)
SUT VIC AB 3-0 SH 27X BRD (SUTURE) IMPLANT
SUT VICRYL 4 0 KS 27 (SUTURE) ×2 IMPLANT
TAPE CLOTH SURG 4X10 WHT LF (GAUZE/BANDAGES/DRESSINGS) ×2 IMPLANT

## 2013-08-03 NOTE — Anesthesia Preprocedure Evaluation (Addendum)
Anesthesia Evaluation  Patient identified by MRN, date of birth, ID band Patient awake and Patient confused    Reviewed: Allergy & Precautions, H&P , NPO status , Patient's Chart, lab work & pertinent test results, reviewed documented beta blocker date and time   Airway Mallampati: III TM Distance: >3 FB Neck ROM: Full    Dental  (+) Teeth Intact   Pulmonary asthma , Current Smoker,  breath sounds clear to auscultation        Cardiovascular Exercise Tolerance: Good hypertension, Pt. on medications Rhythm:Regular Rate:Normal     Neuro/Psych    GI/Hepatic   Endo/Other    Renal/GU      Musculoskeletal   Abdominal (+) + obese,  Abdomen: soft. Bowel sounds: normal.  Peds  Hematology   Anesthesia Other Findings   Reproductive/Obstetrics                        Anesthesia Physical Anesthesia Plan  ASA: II and emergent  Anesthesia Plan: General   Post-op Pain Management:    Induction: Intravenous, Rapid sequence and Cricoid pressure planned  Airway Management Planned: Oral ETT  Additional Equipment:   Intra-op Plan:   Post-operative Plan: Extubation in OR  Informed Consent:   Plan Discussed with: Anesthesiologist  Anesthesia Plan Comments:         Anesthesia Quick Evaluation

## 2013-08-03 NOTE — Progress Notes (Signed)
Notified Dr. Elonda Husky to ask about a diet for the patient because she was requesting to eat. New orders given at this time.

## 2013-08-03 NOTE — Progress Notes (Signed)
Approx. 0830 discussed with Dr. Lorella Nimrod the patients plan of care.    He stated that the patient would be receiving more blood transfusions due to acute decrease in [place].  He also stated that he would be more than likely be taking the patient back to surgery depending on her f/u H/H this afternoon at 1400.  I voiced to him I would notify [place] the nurse supervisor of the plan for surgery so that she could notify the OR team.  He stated he would call her and the ext was given to him.   Approx: [place] from lab came to me and stated that there was a CBC ordered for this patient but there was no order.  I placed the order in the computer since Dr. Glo Herring had discussed with me he had wanted to have it rechecked prior to surgery.  She stated as well that he had called the [facility] to inquire with them about the CBC.  Approx.  [place] Dr. Glo Herring was on the floor in the dictation room and I verbalized to him that the 1300 CBC was 10.5.  He stated that value was good enough to go ahead with the surgery and to hold the 3rd unit of blood for now and if needed  It could be given while down in the OR.  This was confirmed with him via restating the fact of holding the final unit.  The patient is currently stable at this time.  She has no complaints or no concerns was voiced at this time.  I will continue to monitor her.   Approx 1800 the patient returned from surgery and she stated that she was hungry.  No current diet order is in at this time.  Dr. Elonda Husky was notified to see if I could get an order.  For now se only has sips of water and ice chips.

## 2013-08-03 NOTE — Transfer of Care (Signed)
Immediate Anesthesia Transfer of Care Note  Patient: Robin Sherman  Procedure(s) Performed: Procedure(s): EVACUATION PELVIC HEMATOMA (N/A)  Patient Location: PACU  Anesthesia Type:General  Level of Consciousness: awake and patient cooperative  Airway & Oxygen Therapy: Patient Spontanous Breathing and Patient connected to face mask oxygen  Post-op Assessment: Report given to PACU RN, Post -op Vital signs reviewed and stable and Patient moving all extremities  Post vital signs: Reviewed and stable  Complications: No apparent anesthesia complications

## 2013-08-03 NOTE — Brief Op Note (Signed)
08/01/2013 - 08/03/2013  5:15 PM  PATIENT:  Robin Sherman  53 y.o. female  PRE-OPERATIVE DIAGNOSIS:  postop pelvic hematoma  POST-OPERATIVE DIAGNOSIS:  postop pelvic hematoma  PROCEDURE:  Procedure(s): EVACUATION PELVIC HEMATOMA (N/A)  SURGEON:  Surgeon(s) and Role:    * Jonnie Kind, MD - Primary  PHYSICIAN ASSISTANT:   ASSISTANTS: WITT, CST   ANESTHESIA:   general  EBL:  Total I/O In: 1050 [I.V.:1050] Out: 2450 [Urine:1850; Blood:600]  BLOOD ADMINISTERED:none  DRAINS: Urinary Catheter (Foley)  To BE discontinued in a.m. LOCAL MEDICATIONS USED:  NONE  SPECIMEN:  No Specimen  DISPOSITION OF SPECIMEN:  N/A  COUNTS:  YES  TOURNIQUET:  * No tourniquets in log *  DICTATION: .Dragon Dictation  PLAN OF CARE: Has inpatient admission orders  PATIENT DISPOSITION:  PACU - hemodynamically stable.   Delay start of Pharmacological VTE agent (>24hrs) due to surgical blood loss or risk of bleeding: not applicable

## 2013-08-03 NOTE — Progress Notes (Signed)
2 Days Post-Op Procedure(s) (LRB): HYSTERECTOMY SUPRACERVICAL ABDOMINAL (N/A) BILATERAL SALPINGECTOMY (Bilateral)  Subjective: Patient reports that she is still lightheaded upon standing up to go to bathroom, has received 1 unit of blood this am and remains lightheaded, pulse in 90's  Hgb this a.m. Had decreased to 7.7. And transfusion started.   Objective: I have reviewed patient's vital signs, intake and output and labs. CBC    Component Value Date/Time   WBC 9.7 08/03/2013 0519   RBC 2.58* 08/03/2013 0519   HGB 7.7* 08/03/2013 0519   HCT 22.8* 08/03/2013 0519   PLT 275 08/03/2013 0519   MCV 88.4 08/03/2013 0519   MCH 29.8 08/03/2013 0519   MCHC 33.8 08/03/2013 0519   RDW 14.8 08/03/2013 0519   LYMPHSABS 2.5 03/18/2011 1255   MONOABS 0.7 03/18/2011 1255   EOSABS 0.4 03/18/2011 1255   BASOSABS 0.0 03/18/2011 1255   BMET    Component Value Date/Time   NA 140 08/03/2013 0519   K 4.8 08/03/2013 0519   CL 105 08/03/2013 0519   CO2 27 08/03/2013 0519   GLUCOSE 106* 08/03/2013 0519   BUN 10 08/03/2013 0519   CREATININE 0.75 08/03/2013 0519   CREATININE 0.92 12/03/2012 1213   CALCIUM 8.4 08/03/2013 0519   GFRNONAA >90 08/03/2013 0519   GFRAA >90 08/03/2013 0519     General: alert, cooperative, fatigued, no distress and pale Resp: clear to auscultation bilaterally Cardio: regular rate and rhythm GI: normal findings: bowel sounds present, watery, normal pitch. , abnormal findings:  abdominal fullness from the blood collected, tender in upper abdomen to palpation, no guarding or rebound., incision: dry, intact and No change in the small area(5 cm) of old blood on the dressing from the first day. and . Extremities: extremities normal, atraumatic, no cyanosis or edema and Homans sign is negative, no sign of DVT Vaginal Bleeding: none Discussed the alternative of transfusions and observation,versus evacuation of hematoma; pt prefers reexploration to continued observation.  Assessment: s/p  Procedure(s): HYSTERECTOMY SUPRACERVICAL ABDOMINAL (N/A) BILATERAL SALPINGECTOMY (Bilateral): postop hematoma, symptomatic anemia, abdominal discomfort associated with hematoma.  Plan: NPO Transfuse 2nd and 3rd unit prbc, Keep npo, return to OR this PM,  Rationale for surgery, potential for need for further surgery, such as removal of an ovary if needed to control any sites of continued bleeding. To OR this PM.  LOS: 2 days    Bethaney Oshana V 08/03/2013, 9:44 AM

## 2013-08-03 NOTE — Op Note (Signed)
/  16/2015 - 08/03/2013  5:15 PM  PATIENT:  Robin Sherman  53 y.o. female  PRE-OPERATIVE DIAGNOSIS:  postop pelvic hematoma  POST-OPERATIVE DIAGNOSIS:  postop pelvic hematoma  PROCEDURE:  Procedure(s): EVACUATION PELVIC HEMATOMA (N/A)  SURGEON:  Surgeon(s) and Role:    * Jonnie Kind, MD - Primary  PHYSICIAN ASSISTANT:   ASSISTANTS: WITT, CST   Details of procedure:. Patient was taken to the operating room prepped and draped for lower abdominal surgery, timeout conducted. Prior midline vertical incision was opened with minimal fluid in the subcutaneous fatty space. There was no blood. Fascia was intact as was the midline peritoneal closure. Upon opening the peritoneal closure there was a lot of dark thick clots encountered which were suctioned out and the omentum and bowel slowly carefully elevated and clots and bloody fluid in the lower abdomen evacuated as encountered. Balfour retractor and 2 laparotomy tapes to be placed in the abdomen to allow evaluation of the pelvis. The bowel was carefully exposed moving side to side, and finding the right ovary intact with no bleeding or significant amounts of clots in that area, and  the left side was inspected and there is no major amount of clots in the left adnexa lateral to the bowel in the area of the left ovary. Cul-de-sac and the pelvis was filled with approximately 500 cc of dark firm stable clots that filled the pelvis and cul-de-sac with no fresh bleeding. This was taken out cautiously and the cuff inspected. Tissues were somewhat edematous due to postoperative changes, but it appeared that the bleeding originated from the cuff. The cuff was carefully inspected upon exposure and evacuation of the cul-de-sac . there was no active bleeding. Visualization was kept acceptable, and pelvis irrigated and. A laparotomy tape was placed the pelvis,  the upper tapes removed and the upper abdomen irrigated as much as practical through a midline lower  abdominal incision,, carefully irrigating beneath the omentum with saline removing bloody serous fluid. Bowel was moved each side to ensure no significant collection of blood in the lateral gutters of the abdomen and this was negative for significant blood. Pelvis was irrigated one more time laparotomy tapes removed, sponge counts correct. Surgicel and Gelfoam were placed over the cuff as a precaution, and then anterior peritoneum closed with running 2-0 chromic, the fascia closed running 0 PDS, subcutaneous tissues irrigated again and then closed with interrupted 2-0 plain, then subcuticular 4-0 Vicryl on a Keith needle used to close the skin. Steri-Strips were applied pressure dressing applied, and patient allowed to go to recovery room in good condition.

## 2013-08-03 NOTE — Anesthesia Procedure Notes (Signed)
Procedure Name: Intubation Date/Time: 08/03/2013 3:33 PM Performed by: Charmaine Downs Pre-anesthesia Checklist: Patient being monitored, Suction available, Emergency Drugs available and Patient identified Patient Re-evaluated:Patient Re-evaluated prior to inductionOxygen Delivery Method: Circle system utilized Preoxygenation: Pre-oxygenation with 100% oxygen Intubation Type: IV induction, Rapid sequence and Cricoid Pressure applied Ventilation: Mask ventilation without difficulty Laryngoscope Size: Mac and 3 Grade View: Grade I Tube type: Oral Tube size: 7.0 mm Number of attempts: 1 Airway Equipment and Method: Stylet Placement Confirmation: ETT inserted through vocal cords under direct vision,  positive ETCO2 and breath sounds checked- equal and bilateral Secured at: 22 cm Tube secured with: Tape Dental Injury: Teeth and Oropharynx as per pre-operative assessment

## 2013-08-03 NOTE — Anesthesia Postprocedure Evaluation (Signed)
  Anesthesia Post-op Note  Patient: Robin Sherman  Procedure(s) Performed: Procedure(s): EVACUATION PELVIC HEMATOMA (N/A)  Patient Location: PACU  Anesthesia Type:General  Level of Consciousness: awake, alert , oriented and patient cooperative  Airway and Oxygen Therapy: Patient Spontanous Breathing  Post-op Pain: 3 /10, mild  Post-op Assessment: Post-op Vital signs reviewed, Patient's Cardiovascular Status Stable, Respiratory Function Stable, Patent Airway, No signs of Nausea or vomiting and Pain level controlled  Post-op Vital Signs: Reviewed and stable  Complications: No apparent anesthesia complications

## 2013-08-04 ENCOUNTER — Encounter (HOSPITAL_COMMUNITY): Payer: Self-pay | Admitting: Obstetrics and Gynecology

## 2013-08-04 LAB — CBC WITH DIFFERENTIAL/PLATELET
Basophils Absolute: 0 10*3/uL (ref 0.0–0.1)
Basophils Relative: 0 % (ref 0–1)
EOS PCT: 3 % (ref 0–5)
Eosinophils Absolute: 0.4 10*3/uL (ref 0.0–0.7)
HEMATOCRIT: 31.7 % — AB (ref 36.0–46.0)
HEMOGLOBIN: 10.7 g/dL — AB (ref 12.0–15.0)
LYMPHS ABS: 2.5 10*3/uL (ref 0.7–4.0)
LYMPHS PCT: 19 % (ref 12–46)
MCH: 28.9 pg (ref 26.0–34.0)
MCHC: 33.8 g/dL (ref 30.0–36.0)
MCV: 85.7 fL (ref 78.0–100.0)
MONO ABS: 1.1 10*3/uL — AB (ref 0.1–1.0)
MONOS PCT: 8 % (ref 3–12)
NEUTROS ABS: 9.1 10*3/uL — AB (ref 1.7–7.7)
Neutrophils Relative %: 70 % (ref 43–77)
Platelets: 323 10*3/uL (ref 150–400)
RBC: 3.7 MIL/uL — AB (ref 3.87–5.11)
RDW: 14.6 % (ref 11.5–15.5)
WBC: 13.1 10*3/uL — AB (ref 4.0–10.5)

## 2013-08-04 LAB — CBC
HCT: 31.7 % — ABNORMAL LOW (ref 36.0–46.0)
HEMOGLOBIN: 10.7 g/dL — AB (ref 12.0–15.0)
MCH: 29.2 pg (ref 26.0–34.0)
MCHC: 33.8 g/dL (ref 30.0–36.0)
MCV: 86.4 fL (ref 78.0–100.0)
PLATELETS: 300 10*3/uL (ref 150–400)
RBC: 3.67 MIL/uL — AB (ref 3.87–5.11)
RDW: 15.1 % (ref 11.5–15.5)
WBC: 12.9 10*3/uL — ABNORMAL HIGH (ref 4.0–10.5)

## 2013-08-04 MED ORDER — PIPERACILLIN-TAZOBACTAM 3.375 G IVPB
3.3750 g | Freq: Three times a day (TID) | INTRAVENOUS | Status: DC
  Administered 2013-08-04 – 2013-08-05 (×5): 3.375 g via INTRAVENOUS
  Filled 2013-08-04 (×6): qty 50

## 2013-08-04 MED ORDER — IBUPROFEN 600 MG PO TABS
600.0000 mg | ORAL_TABLET | Freq: Four times a day (QID) | ORAL | Status: DC | PRN
  Administered 2013-08-04: 600 mg via ORAL
  Filled 2013-08-04: qty 1

## 2013-08-04 NOTE — Progress Notes (Signed)
Dr. Glo Herring was notified at 1530 of temp.  New orders were given and followed.  The patient has no complaints or concerns voiced at this time.

## 2013-08-04 NOTE — Progress Notes (Signed)
Paged Dr. Glo Herring to notify him of the patients temp 102.1 per order to be called for greater than 100.2.  Waiting on call return. Pt does c/o abdominal pain.   Will continue to monitor.  VS T 102.1 HR 104 Pulse 20 BP: 146/90.

## 2013-08-04 NOTE — Progress Notes (Signed)
Respiratory has been notified that an IS is needed for room this patient.  Spoke with Robin Sherman over the weekend and Robin Sherman and Robin Sherman today.

## 2013-08-04 NOTE — Progress Notes (Signed)
1 Day Post-Op Procedure(s) (LRB): EVACUATION PELVIC HEMATOMA (N/A)  Subjective: Patient reports incisional pain, feels better than before surgery and transfusion. Pain has ranged from 10-2-6 overnite, responded to IV analgesics.  Pt ready to ambulate. No SOB, abd less full to pt perception.   Objective: I have reviewed patient's vital signs, intake and output and labs. CBC    Component Value Date/Time   WBC 12.9* 08/04/2013 0440   RBC 3.67* 08/04/2013 0440   HGB 10.7* 08/04/2013 0440   HCT 31.7* 08/04/2013 0440   PLT 300 08/04/2013 0440   MCV 86.4 08/04/2013 0440   MCH 29.2 08/04/2013 0440   MCHC 33.8 08/04/2013 0440   RDW 15.1 08/04/2013 0440   LYMPHSABS 3.0 08/03/2013 1342   MONOABS 1.1* 08/03/2013 1342   EOSABS 0.4 08/03/2013 1342   BASOSABS 0.1 08/03/2013 1342   Essentially unchanged from preop yesterday. Temp:  [97.2 F (36.2 C)-99.7 F (37.6 C)] 99.5 F (37.5 C) (01/19 0425) Pulse Rate:  [80-109] 109 (01/19 0425) Resp:  [12-22] 22 (01/19 0425) BP: (116-156)/(77-94) 138/81 mmHg (01/19 0425) SpO2:  [93 %-100 %] 100 % (01/19 0425)  General: alert, cooperative and no distress Resp: clear to auscultation bilaterally GI: soft, non-tender; bowel sounds normal; no masses,  no organomegaly and incision: clean, dry and intact Extremities: extremities normal, atraumatic, no cyanosis or edema Vaginal Bleeding: none   Assessment: s/p Procedure(s): EVACUATION PELVIC HEMATOMA (N/A): stable and progressing well POstop day 3 s/p supracervical hysterectomy, s/p postop pelvic hematoma requiring 2 units prbc. And evacuation of hematoma. Plan: Advance diet d/c foley  LOS: 3 days    Keyshon Stein V 08/04/2013, 7:48 AM

## 2013-08-04 NOTE — Clinical Documentation Improvement (Signed)
THIS DOCUMENT IS NOT A PERMANENT PART OF THE MEDICAL RECORD  Please update your documentation with the medical record to reflect your response to this query. If you need help knowing how to do this please call [phone].  08/04/13  Dear Dr. Glo Herring Rolley Sims  A review of the patient medical record has revealed the following indicators. Based on your clinical judgment, please clarify and document in a progress note and/or discharge summary the clinical condition associated with the following supporting information:  Please clarify acuity. Thank you.  Possible Clinical Conditions?   Expected Acute Blood Loss Anemia Acute Blood Loss Anemia Acute on chronic blood loss anemia Chronic blood loss anemia Precipitous drop in Hematocrit Other Condition________________ Cannot Clinically Determine    Supporting Information: Risk Factors:  S/P HYSTERECTOMY SUPRACERVICAL ABDOMINAL; BILATERAL SALPINGECTOMY (Bilateral) EBL 100  postop pelvic hematoma s/p EVACUATION PELVIC HEMATOMA  EBL 600 Postoperative blood loss s/p supracervical hysterectomy,     Signs and Symptoms  Lightheaded,  Fatigued, pale   Diagnostics: H&H 1/16 = 10.0/30.4 1/17 = 8.3/24.5 1/18 = 7.7/22.8 1/19 = 10.7/31.7  Treatments: Transfusion: 3 units PRBC  H&H monitoring  Reviewed: additional documentation in the medical record  Thank You,  [place] Documentation Specialist: [place]    The acute surgical blood loss anemia was documented in todays PM note.

## 2013-08-04 NOTE — Progress Notes (Addendum)
1 Day Post-Op Procedure(s) (LRB): EVACUATION PELVIC HEMATOMA (N/A)  Subjective: Patient reports stable, but temp to 102. .    Objective: I have reviewed patient's vital signs.    Assessment: s/p Procedure(s): EVACUATION PELVIC HEMATOMA (N/A): fever Rule out postop infection. Plan: blood cultures x 2, cbc, then zosyn 3.75. q8h  LOS: 3 days    Robin Sherman V 08/04/2013, 3:29 PM    Addendum;  The anemia is considered acute surgical blood anemia

## 2013-08-04 NOTE — Care Management Note (Addendum)
    Page 1 of 1   08/06/2013     8:53:52 AM   CARE MANAGEMENT NOTE 08/06/2013  Patient:  Robin Sherman, Robin Sherman   Account Number:  [id number]  Date Initiated:  08/04/2013  Documentation initiated by:  Theophilus Kinds  Subjective/Objective Assessment:   Pt admitted s/p abd hysterectomy and clot evacuation. Pt lives with her husband and will return home at discharge. Pt is independent with ADL's.     Action/Plan:   No CM needs noted.   Anticipated DC Date:  08/06/2013   Anticipated DC Plan:  [place]  CM consult      Choice offered to / List presented to:             Status of service:  Completed, signed off Medicare Important Message given?   (If response is "NO", the following Medicare IM given date fields will be blank) Date Medicare IM given:   Date Additional Medicare IM given:    Discharge Disposition:  HOME/SELF CARE  Per UR Regulation:    If discussed at Long Length of Stay Meetings, dates discussed:    Comments:  08/06/13 [place], RN BSN CM Pt discharged home today. No CM needs noted.  08/04/13 [place], RN BSN CM

## 2013-08-05 ENCOUNTER — Encounter (HOSPITAL_COMMUNITY): Payer: Self-pay | Admitting: Obstetrics and Gynecology

## 2013-08-05 LAB — CBC WITH DIFFERENTIAL/PLATELET
BASOS ABS: 0 10*3/uL (ref 0.0–0.1)
BASOS PCT: 0 % (ref 0–1)
EOS PCT: 5 % (ref 0–5)
Eosinophils Absolute: 0.6 10*3/uL (ref 0.0–0.7)
HEMATOCRIT: 32.7 % — AB (ref 36.0–46.0)
HEMOGLOBIN: 11 g/dL — AB (ref 12.0–15.0)
Lymphocytes Relative: 21 % (ref 12–46)
Lymphs Abs: 2.5 10*3/uL (ref 0.7–4.0)
MCH: 28.7 pg (ref 26.0–34.0)
MCHC: 33.6 g/dL (ref 30.0–36.0)
MCV: 85.4 fL (ref 78.0–100.0)
Monocytes Absolute: 0.8 10*3/uL (ref 0.1–1.0)
Monocytes Relative: 7 % (ref 3–12)
Neutro Abs: 8 10*3/uL — ABNORMAL HIGH (ref 1.7–7.7)
Neutrophils Relative %: 67 % (ref 43–77)
Platelets: 329 10*3/uL (ref 150–400)
RBC: 3.83 MIL/uL — ABNORMAL LOW (ref 3.87–5.11)
RDW: 14.3 % (ref 11.5–15.5)
WBC: 12 10*3/uL — ABNORMAL HIGH (ref 4.0–10.5)

## 2013-08-05 LAB — GLUCOSE, CAPILLARY
Glucose-Capillary: 126 mg/dL — ABNORMAL HIGH (ref 70–99)
Glucose-Capillary: 127 mg/dL — ABNORMAL HIGH (ref 70–99)

## 2013-08-05 MED ORDER — AMLODIPINE BESYLATE 5 MG PO TABS
10.0000 mg | ORAL_TABLET | Freq: Every day | ORAL | Status: DC
Start: 2013-08-05 — End: 2013-08-06
  Administered 2013-08-05: 10 mg via ORAL
  Filled 2013-08-05: qty 2

## 2013-08-05 NOTE — Progress Notes (Signed)
2 Days Post-Op Procedure(s) (LRB): EVACUATION PELVIC HEMATOMA (N/A)  Subjective: Patient reports tolerating PO, + flatus and no problems voiding.    Objective: I have reviewed patient's vital signs, medications, labs and pathology. Pt had the single elevated temp to 102, now afebrile. Bp returning to above normal levels willrestart Norvasc.  General: alert, cooperative and no distress Resp: clear to auscultation bilaterally GI: normal findings: bowel sounds normal and abnormal findings:  abdominal  fullness, no guarding or rebound Extremities: extremities normal, atraumatic, no cyanosis or edema and Homans sign is negative, no sign of DVT Vaginal Bleeding: none  Assessment: s/p Procedure(s): EVACUATION PELVIC HEMATOMA (N/A): stable, tolerating diet and post-op fever  Plan: Advance diet Discontinue IV fluids retart Norvasc, continue Zosyn, reduce IV fluids  to KVO. reg diet.  LOS: 4 days    Robin Sherman V 08/05/2013, 7:45 AM

## 2013-08-05 NOTE — Progress Notes (Signed)
Patient lying in bed. Alert and oriented. No complaints voiced at this time. Dressing to abdomen clean dry and intact. y

## 2013-08-06 LAB — TYPE AND SCREEN
ABO/RH(D): O POS
Antibody Screen: NEGATIVE
UNIT DIVISION: 0
Unit division: 0
Unit division: 0
Unit division: 0

## 2013-08-06 LAB — GLUCOSE, CAPILLARY: GLUCOSE-CAPILLARY: 109 mg/dL — AB (ref 70–99)

## 2013-08-06 MED ORDER — CIPROFLOXACIN HCL 500 MG PO TABS: 500.0000 mg | ORAL_TABLET | Freq: Two times a day (BID) | ORAL | Status: DC

## 2013-08-06 MED ORDER — OXYCODONE HCL 5 MG PO TABS: 5.0000 mg | ORAL_TABLET | ORAL | Status: DC | PRN

## 2013-08-06 NOTE — Progress Notes (Signed)
IV removed, site WNL.  Pt given d/c instructions and new prescriptions.  Discussed home care with patient and discussed home medications (when, how, and why to take), patient verbalizes understanding, teachback completed. F/U appointment in place with Dr Glo Herring, pt states they will keep appointment. Pt is stable at this time. Pt taken to main entrance by staff, ambulated-refused wheelchair.

## 2013-08-06 NOTE — Discharge Summary (Addendum)
Physician Discharge Summary  Patient ID: Robin Sherman MRN: [id number] DOB/AGE: [birth year] 53 y.o.  Admit date: 08/01/2013 Discharge date: 08/06/2013  Admission Diagnoses: Uterine fibroids symptomatic  Discharge Diagnoses: Uterine fibroids, adenomyosis Postoperative pelvic hematoma Active Problems:   * No active hospital problems. *   Discharged Condition: good  Hospital Course: Patient was admitted through day surgery and underwent abdominal supracervical hysterectomy with bilateral salpingectomy on the day of admission 08/01/2013. Preoperative labs had identified a UTI with Citrobacter The evening of surgery the patient had tachycardia and mild hypotension with decreased urinary output, abdominal distention suggesting intra-abdominal bleeding. She had hemoglobin gradual decline requiring transfusion x2 and due to the suspected pelvic hematoma and was taken back to the operating room on postop day 2 for evacuation of a large 500 cc clot in the pelvis. The bleeding appeared to have come from the vaginal cuff, despite the fact that the cuff was oversewn with interrupted 0 chromic sutures at the time of original surgery, and had been inspected and considered hemostatic at the completion of the surgical procedure.  She did very well after the evacuation of pelvic hematoma her, except for a single isolated temperature to 102 on postop day 1 for which blood cultures were obtained which remained negative, she was placed on Zosyn. And this was discontinued after 48 hours.  Consults: None  Significant Diagnostic Studies: labs:  CBC    Component Value Date/Time   WBC 12.0* 08/05/2013 0441   RBC 3.83* 08/05/2013 0441   HGB 11.0* 08/05/2013 0441   HCT 32.7* 08/05/2013 0441   PLT 329 08/05/2013 0441   MCV 85.4 08/05/2013 0441   MCH 28.7 08/05/2013 0441   MCHC 33.6 08/05/2013 0441   RDW 14.3 08/05/2013 0441   LYMPHSABS 2.5 08/05/2013 0441   MONOABS 0.8 08/05/2013 0441   EOSABS 0.6 08/05/2013 0441    BASOSABS 0.0 08/05/2013 0441       Treatments: surgery: 08/01/2013 abdominal supracervical hysterectomy bilateral salpingectomy 08/03/2013 evacuation of pelvic hematoma 08/02/2013 transfusion 2 units packed cells  Discharge Exam: Blood pressure 123/68, pulse 79, temperature 98.1 F (36.7 C), temperature source Oral, resp. rate 20, height 5\' 4"  (1.626 m), weight 241 lb (109.317 kg), last menstrual period 04/15/2013, SpO2 99.00%. General appearance: alert, appears stated age, no distress and moderately obese GI: soft, non-tender; bowel sounds normal; no masses,  no organomegaly Extremities: extremities normal, atraumatic, no cyanosis or edema and Homans sign is negative, no sign of DVT Incision/Wound: clean dry and intact no erythema  Disposition: 01-Home or Self Care we'll postpone appointment for 08/08/2013 until 08/12/2013  Discharge Orders   Future Appointments Provider Department Dept Phone   08/08/2013 11:30 AM Jonnie Kind, MD [facility] [phone]   08/15/2013 10:30 AM Alycia Rossetti, MD [place] [phone]   Future Orders Complete By Expires   Diet - low sodium heart healthy  As directed    Discharge instructions  As directed    Comments:     General Gynecological Post-Operative Instructions You may expect to feel dizzy, weak, and drowsy for as long as 24 hours after receiving the medicine that made you sleep (anesthetic). The following information pertains to your recovery period for the first 24 hours following surgery. You may shower. Please leave the strips of tape on the skin at the incision for one week Do not drive a car, ride a bicycle, participate in physical activities, or take public transportation until you are done taking narcotic pain medicines  or as directed by your caregiver.  Do not drink alcohol or take tranquilizers.  Do not take medicine that has not been prescribed by your caregiver.  Do not sign important papers or make  important decisions while on narcotic pain medicines.  Have a responsible person with you.  CARE OF INCISION  Keep incision clean and dry. Take showers instead of baths until your caregiver gives you permission to take baths. Check with your caregiver if you have tubes coming from the wound site.  Avoid heavy lifting (more than 10 pounds/4.5 kilograms), pushing, or pulling.  Avoid activities that may risk injury to your surgical site.  Only take over-the-counter or prescription medicines for pain, discomfort, or fever as directed by your caregiver. Do not take aspirin. It can make you bleed. Take medicines (antibiotics) that kill germs as directed.  Call the office or go to the MAU if:  You feel sick to your stomach (nauseous).  You start to throw up (vomit).  You have trouble eating or drinking.  You have an oral temperature above 100.4.  You have constipation that is not helped by adjusting diet or increasing fluid intake. Pain medicines are a common cause of constipation.  SEEK IMMEDIATE MEDICAL CARE IF:  You have persistent dizziness.  You have difficulty breathing or a congested sounding (croupy) cough.  You have an oral temperature above 102.5, not controlled by medicine.  There is increasing pain or tenderness near or in the surgical site.  ExitCare Patient Information 2011 [place].   Increase activity slowly  As directed        Medication List         amLODipine 10 MG tablet  Commonly known as:  NORVASC  Take 1 tablet (10 mg total) by mouth daily.     ciprofloxacin 500 MG tablet  Commonly known as:  CIPRO  Take 1 tablet (500 mg total) by mouth 2 (two) times daily.     halobetasol 0.05 % cream  Commonly known as:  ULTRAVATE  Apply topically 2 (two) times daily.     oxyCODONE 5 MG immediate release tablet  Commonly known as:  Oxy IR/ROXICODONE  Take 1 tablet (5 mg total) by mouth every 4 (four) hours as needed for severe pain.           Follow-up  Information   Follow up with Jonnie Kind, MD In 1 week. (08/12/2013 or as needed if symptoms worsen)    Specialties:  Obstetrics and Gynecology, Radiology   Contact information:   [place] [address] [phone]       Signed: Jonnie Kind 08/06/2013, 7:48 AM

## 2013-08-06 NOTE — Progress Notes (Signed)
3 Days Post-Op Procedure(s) (LRB): EVACUATION PELVIC HEMATOMA (N/A), in 5 days status post supracervical hysterectomy bilateral salpingectomy  Subjective: Patient reports tolerating PO, + flatus and no problems voiding.    Objective: I have reviewed patient's vital signs, intake and output, labs and pathology. Pathology shows benign leiomyomata, a 300 g uterus, with adenomyosis patient had a single isolated temperature 102.1, blood cultures remained negative. Since the patient was admitted to the hospital with a Citrobacter UTI, will treat with Cipro x7 days total therapy  General: alert, cooperative and no distress Resp: clear to auscultation bilaterally GI: soft, non-tender; bowel sounds normal; no masses,  no organomegaly and incision: clean, dry and intact Vaginal Bleeding: none CBC    Component Value Date/Time   WBC 12.0* 08/05/2013 0441   RBC 3.83* 08/05/2013 0441   HGB 11.0* 08/05/2013 0441   HCT 32.7* 08/05/2013 0441   PLT 329 08/05/2013 0441   MCV 85.4 08/05/2013 0441   MCH 28.7 08/05/2013 0441   MCHC 33.6 08/05/2013 0441   RDW 14.3 08/05/2013 0441   LYMPHSABS 2.5 08/05/2013 0441   MONOABS 0.8 08/05/2013 0441   EOSABS 0.6 08/05/2013 0441   BASOSABS 0.0 08/05/2013 0441     Assessment: s/p Procedure(s): EVACUATION PELVIC HEMATOMA (N/A): stable Status post supracervical hysterectomy bilateral salpingectomy for uterine fibroids Adenomyosis per pathology report Postoperative bleeding, pelvic hematoma, evacuated postop day 2 Citrobacter UTI preceding surgery  Plan: Discharge home Will discontinue IV antibiotics to complete a seven-day course of Cipro for the preoperative UTI  LOS: 5 days    Ciella Obi V 08/06/2013, 7:39 AM

## 2013-08-06 NOTE — Discharge Instructions (Signed)
Hysterectomy Information  A hysterectomy is a surgery in which your uterus is removed. This surgery may be done to treat various medical problems. After the surgery, you will no longer have menstrual periods. The surgery will also make you unable to become pregnant (sterile). The fallopian tubes and ovaries can be removed (bilateral salpingo-oophorectomy) during this surgery as well.  REASONS FOR A HYSTERECTOMY  Persistent, abnormal bleeding.  Lasting (chronic) pelvic pain or infection.  The lining of the uterus (endometrium) starts growing outside the uterus (endometriosis).  The endometrium starts growing in the muscle of the uterus (adenomyosis).  The uterus falls down into the vagina (pelvic organ prolapse).  Noncancerous growths in the uterus (uterine fibroids) that cause symptoms.  Precancerous cells.  Cervical cancer or uterine cancer. TYPES OF HYSTERECTOMIES  Supracervical hysterectomy In this type, the top part of the uterus is removed, but not the cervix.  Total hysterectomy The uterus and cervix are removed.  Radical hysterectomy The uterus, the cervix, and the fibrous tissue that holds the uterus in place in the pelvis (parametrium) are removed. WAYS A HYSTERECTOMY CAN BE PERFORMED  Abdominal hysterectomy A large surgical cut (incision) is made in the abdomen. The uterus is removed through this incision.  Vaginal hysterectomy An incision is made in the vagina. The uterus is removed through this incision. There are no abdominal incisions.  Conventional laparoscopic hysterectomy Three or four small incisions are made in the abdomen. A thin, lighted tube with a camera (laparoscope) is inserted into one of the incisions. Other tools are put through the other incisions. The uterus is cut into small pieces. The small pieces are removed through the incisions, or they are removed through the vagina.  Laparoscopically assisted vaginal hysterectomy (LAVH) Three or four small  incisions are made in the abdomen. Part of the surgery is performed laparoscopically and part vaginally. The uterus is removed through the vagina.  Robot-assisted laparoscopic hysterectomy A laparoscope and other tools are inserted into 3 or 4 small incisions in the abdomen. A computer-controlled device is used to give the surgeon a 3D image and to help control the surgical instruments. This allows for more precise movements of surgical instruments. The uterus is cut into small pieces and removed through the incisions or removed through the vagina. RISKS AND COMPLICATIONS  Possible complications associated with this procedure include:  Bleeding and risk of blood transfusion. Tell your health care provider if you do not want to receive any blood products.  Blood clots in the legs or lung.  Infection.  Injury to surrounding organs.  Problems or side effects related to anesthesia.  Conversion to an abdominal hysterectomy from one of the other techniques. WHAT TO EXPECT AFTER A HYSTERECTOMY  You will be given pain medicine.  You will need to have someone with you for the first 3 5 days after you go home.  You will need to follow up with your surgeon in 2 4 weeks after surgery to evaluate your progress.  You may have early menopause symptoms such as hot flashes, night sweats, and insomnia.  If you had a hysterectomy for a problem that was not cancer or not a condition that could lead to cancer, then you no longer need Pap tests. However, even if you no longer need a Pap test, a regular exam is a good idea to make sure no other problems are starting. Document Released: 12/27/2000 Document Revised: 04/23/2013 Document Reviewed: 03/10/2013 Exit[employer] Patient Information 2014 [employer].  

## 2013-08-07 ENCOUNTER — Telehealth: Payer: Self-pay | Admitting: Obstetrics and Gynecology

## 2013-08-07 NOTE — Telephone Encounter (Signed)
Pt taking oxycodone, "med makes her feel like she has a pulling on chest and makes her feel like she can not breath." Pt told not to take the oxycodone again, can take OTC ibuprofen until her appt tomorrow with Dr. Carmell Austria. Pt verbalized understanding.

## 2013-08-07 NOTE — Telephone Encounter (Signed)
Pt has an appt tomorrow at 11:30 am.

## 2013-08-08 ENCOUNTER — Encounter (INDEPENDENT_AMBULATORY_CARE_PROVIDER_SITE_OTHER): Payer: Self-pay

## 2013-08-08 ENCOUNTER — Encounter: Payer: Self-pay | Admitting: Obstetrics and Gynecology

## 2013-08-08 ENCOUNTER — Ambulatory Visit (INDEPENDENT_AMBULATORY_CARE_PROVIDER_SITE_OTHER): Payer: BC Managed Care – PPO | Admitting: Obstetrics and Gynecology

## 2013-08-08 VITALS — BP 138/80 | Ht 64.0 in | Wt 227.0 lb

## 2013-08-08 DIAGNOSIS — R079 Chest pain, unspecified: Secondary | ICD-10-CM

## 2013-08-08 NOTE — Patient Instructions (Signed)
Report to [facility] for CT chest today .

## 2013-08-08 NOTE — Progress Notes (Signed)
   Subjective:  Robin Sherman is a 53 y.o. female who presents to the clinic 5 days status post post operative pelvic hematoma .   She also reports having chest pain and back pain when laying down.  Review of Systems Negative except chest pain, back pain  She has been eating a regular diet without difficulty.   Bowel movements are normal. Pain is controlled with current analgesics. Medications being used: narcotic analgesics including oxycodone/acetaminophen (Percocet, Tylox).  Objective:  BP 138/80  Ht 5\' 4"  (1.626 m)  Wt 227 lb (102.967 kg)  BMI 38.95 kg/m2  LMP 04/15/2013 General:Well developed, well nourished.  No acute distress. Pain in rt chest with lying supine.  Heart: Mild tachycardia. Unchanged from hospitalization, and regular rhythm. No murmer, rub, galop. Pulse 84 Pulmonary: Lungs clear to ausculation.  Abdomen: Bowel sounds normal, soft, non-tender. Pelvic Exam:    External Genitalia:  Normal.    Vagina: Normal    Bimanual: Normal     Incision(s):   Healing well, no drainage, no erythema, no hernia, no swelling, no dehiscence, incision well approximated.tape in place   Assessment:  Post-Op 5 days s/p post operative pelvic hematoma   chest pain, r/o PE Doing well postoperatively.   Plan:  1.Wound care discussed   To [facility] for CT chest 2. .Continue any current medications. 3. Activity restrictions: as per discharge protocol 4. return to work: return to work in 6 weeks. 5. Follow up in 2 weeks.

## 2013-08-09 LAB — CULTURE, BLOOD (ROUTINE X 2)
Culture: NO GROWTH
Culture: NO GROWTH

## 2013-08-15 ENCOUNTER — Encounter: Payer: Self-pay | Admitting: Family Medicine

## 2013-08-15 ENCOUNTER — Ambulatory Visit (INDEPENDENT_AMBULATORY_CARE_PROVIDER_SITE_OTHER): Payer: BC Managed Care – PPO | Admitting: Family Medicine

## 2013-08-15 VITALS — BP 130/90 | HR 82 | Temp 98.4°F | Resp 18 | Ht 64.0 in | Wt 226.0 lb

## 2013-08-15 DIAGNOSIS — N390 Urinary tract infection, site not specified: Secondary | ICD-10-CM

## 2013-08-15 DIAGNOSIS — E669 Obesity, unspecified: Secondary | ICD-10-CM

## 2013-08-15 DIAGNOSIS — J4520 Mild intermittent asthma, uncomplicated: Secondary | ICD-10-CM

## 2013-08-15 DIAGNOSIS — L259 Unspecified contact dermatitis, unspecified cause: Secondary | ICD-10-CM

## 2013-08-15 DIAGNOSIS — IMO0002 Reserved for concepts with insufficient information to code with codable children: Secondary | ICD-10-CM

## 2013-08-15 DIAGNOSIS — I10 Essential (primary) hypertension: Secondary | ICD-10-CM

## 2013-08-15 DIAGNOSIS — L309 Dermatitis, unspecified: Secondary | ICD-10-CM

## 2013-08-15 DIAGNOSIS — J45909 Unspecified asthma, uncomplicated: Secondary | ICD-10-CM

## 2013-08-15 LAB — CBC WITH DIFFERENTIAL/PLATELET
BASOS PCT: 1 % (ref 0–1)
Basophils Absolute: 0.1 10*3/uL (ref 0.0–0.1)
EOS ABS: 0.4 10*3/uL (ref 0.0–0.7)
Eosinophils Relative: 3 % (ref 0–5)
HCT: 35.7 % — ABNORMAL LOW (ref 36.0–46.0)
Hemoglobin: 12.2 g/dL (ref 12.0–15.0)
Lymphocytes Relative: 24 % (ref 12–46)
Lymphs Abs: 2.7 10*3/uL (ref 0.7–4.0)
MCH: 27.7 pg (ref 26.0–34.0)
MCHC: 34.2 g/dL (ref 30.0–36.0)
MCV: 81.1 fL (ref 78.0–100.0)
MONOS PCT: 7 % (ref 3–12)
Monocytes Absolute: 0.8 10*3/uL (ref 0.1–1.0)
NEUTROS PCT: 65 % (ref 43–77)
Neutro Abs: 7.2 10*3/uL (ref 1.7–7.7)
Platelets: 521 10*3/uL — ABNORMAL HIGH (ref 150–400)
RBC: 4.4 MIL/uL (ref 3.87–5.11)
RDW: 14.1 % (ref 11.5–15.5)
WBC: 11.1 10*3/uL — ABNORMAL HIGH (ref 4.0–10.5)

## 2013-08-15 LAB — URINALYSIS, MICROSCOPIC ONLY
Casts: NONE SEEN
Crystals: NONE SEEN
WBC, UA: NONE SEEN WBC/hpf (ref <3)

## 2013-08-15 LAB — URINALYSIS, ROUTINE W REFLEX MICROSCOPIC
BILIRUBIN URINE: NEGATIVE
Glucose, UA: NEGATIVE mg/dL
KETONES UR: NEGATIVE mg/dL
Leukocytes, UA: NEGATIVE
Nitrite: NEGATIVE
PROTEIN: NEGATIVE mg/dL
SPECIFIC GRAVITY, URINE: 1.025 (ref 1.005–1.030)
Urobilinogen, UA: 0.2 mg/dL (ref 0.0–1.0)
pH: 6 (ref 5.0–8.0)

## 2013-08-15 LAB — LIPID PANEL
CHOL/HDL RATIO: 4.3 ratio
CHOLESTEROL: 182 mg/dL (ref 0–200)
HDL: 42 mg/dL (ref >39)
LDL Cholesterol: 114 mg/dL — ABNORMAL HIGH (ref 0–99)
TRIGLYCERIDES: 131 mg/dL (ref <150)
VLDL: 26 mg/dL (ref 0–40)

## 2013-08-15 MED ORDER — AMLODIPINE BESYLATE 10 MG PO TABS: 10.0000 mg | ORAL_TABLET | Freq: Every day | ORAL | Status: DC

## 2013-08-15 MED ORDER — ALBUTEROL SULFATE HFA 108 (90 BASE) MCG/ACT IN AERS: 2.0000 | INHALATION_SPRAY | RESPIRATORY_TRACT | Status: DC | PRN

## 2013-08-15 MED ORDER — HALOBETASOL PROPIONATE 0.05 % EX CREA: TOPICAL_CREAM | Freq: Two times a day (BID) | CUTANEOUS | Status: DC

## 2013-08-15 NOTE — Progress Notes (Signed)
   Subjective:    Patient ID: Robin Sherman, female    DOB: [birth year], 53 y.o.   MRN: [id number]  HPI Patient here to follow chronic medical problems. Since her last visit she is status post hysterectomy secondary to dysfunctional uterine bleeding she subsequently had hematoma at the incision site which had to be evacuated. She was also treated for urinary tract infection about 2 weeks ago with ciprofloxacin Citrobacter was noted. She continues to have a lot of pressure and pain when she defecates and when she urinates. She feels like something is going to fall out the bottom of her stomach. Her pain is mostly centered around her incision which is still healing. She also had a few episodes of chest discomfort when she was taking oxycodone IR she was going to have a CT of the chest however there was a mix up with the orders and this was not done but by that Monday her pain had resolved and she was on a new pain medication which is Percocet without any difficulty. He is not had any chest pain or shortness of breath since then.    Review of Systems  GEN- denies fatigue, fever, weight loss,weakness, recent illness HEENT- denies eye drainage, change in vision, nasal discharge, CVS- denies chest pain, palpitations RESP- denies SOB, cough, wheeze ABD- denies N/V, change in stools, +abd pain GU- denies dysuria, hematuria, dribbling, incontinence MSK- denies joint pain, muscle aches, injury Neuro- denies headache, dizziness, syncope, seizure activity      Objective:   Physical Exam  GEN- NAD, alert and oriented x3, obese HEENT- PERRL, EOMI, non injected sclera, pink conjunctiva, MMM, oropharynx clear CVS- RRR, no murmur RESP-CTAB ABD-NABS,soft, incision d/c/i, open with granulation tissue right above suprapubic region, mild TTP around incision and above umbilicus, no rebound, no gaurding, no erythema EXT- No edema Pulses- Radial 2+        Assessment & Plan:

## 2013-08-15 NOTE — Assessment & Plan Note (Signed)
Chronic hand dermatitis her cream was refilled

## 2013-08-15 NOTE — Assessment & Plan Note (Signed)
I think that the lower abdominal pain is due to the incision and not a true UTI I have sent her urine for repeat culture she's not had any dysuria

## 2013-08-15 NOTE — Assessment & Plan Note (Signed)
Blood pressure elevated I will today however with her current incisional pain we will continue to monitor this to

## 2013-08-15 NOTE — Assessment & Plan Note (Addendum)
She status post intervention for this. I've advised her to take a stool softener with her narcotic medication to help with the lower, pain as well as her incision heals No evidence of superinfection

## 2013-08-15 NOTE — Assessment & Plan Note (Signed)
Rare use of albuterol this was refilled today

## 2013-08-15 NOTE — Assessment & Plan Note (Signed)
Discussed importance of weight loss and regular exercise short-term goals have been set she plans to start her exercise again was released from her GYN surgery

## 2013-08-15 NOTE — Patient Instructions (Addendum)
Get a stool softener over the counter Continue blood pressure medications Plenty of water and fiber Work on weight loss! F/U 4 months -end of May for PHYSICAL

## 2013-08-17 LAB — URINE CULTURE
Colony Count: NO GROWTH
Organism ID, Bacteria: NO GROWTH

## 2013-08-25 ENCOUNTER — Encounter: Payer: Self-pay | Admitting: Obstetrics and Gynecology

## 2013-08-25 ENCOUNTER — Ambulatory Visit (INDEPENDENT_AMBULATORY_CARE_PROVIDER_SITE_OTHER): Payer: BC Managed Care – PPO | Admitting: Obstetrics and Gynecology

## 2013-08-25 ENCOUNTER — Encounter (INDEPENDENT_AMBULATORY_CARE_PROVIDER_SITE_OTHER): Payer: Self-pay

## 2013-08-25 VITALS — BP 150/88 | Wt 227.2 lb

## 2013-08-25 DIAGNOSIS — Z9889 Other specified postprocedural states: Secondary | ICD-10-CM

## 2013-08-25 DIAGNOSIS — Z90711 Acquired absence of uterus with remaining cervical stump: Secondary | ICD-10-CM

## 2013-08-25 DIAGNOSIS — IMO0002 Reserved for concepts with insufficient information to code with codable children: Secondary | ICD-10-CM

## 2013-08-25 MED ORDER — OXYCODONE-ACETAMINOPHEN 5-325 MG PO TABS: 1.0000 | ORAL_TABLET | Freq: Four times a day (QID) | ORAL | Status: DC | PRN

## 2013-08-25 MED ORDER — CIPROFLOXACIN HCL 500 MG PO TABS: 500.0000 mg | ORAL_TABLET | Freq: Two times a day (BID) | ORAL | Status: DC

## 2013-08-25 NOTE — Progress Notes (Signed)
  This chart was transcribed for Dr. Mallory Shirk by Ludger Nutting, ED scribe.   Subjective:  Robin Sherman is a 53 y.o. female who presents to the clinic 3 weeks status post post operative pelvic hematoma.  She reports having continued abdominal pain. Of mild intensity  Review of Systems Negative except intermittent chills. She denies temperature elevation.   She has been eating a regular diet with difficulty.   Bowel movements are normal but is using stool softeners. Pain is controlled with current analgesics. Medications being used: narcotic analgesics including hydrocodone/acetaminophen (Lorcet, Lortab, Norco, Vicodin).  Objective:  BP 150/88  Wt 227 lb 3.2 oz (103.057 kg)  LMP 04/15/2013 General:Well developed, well nourished.  No acute distress. Abdomen: Bowel sounds normal, soft, non-tender. Pelvic Exam: not indicated   Incision(s):   Healing well, no drainage, no erythema, no hernia, no swelling, no dehiscence, incision slight retraction on upper portion, no fluctuance, feels slightly warmer than rest of abd wall. 1 cm area at the edge of incision shows a superficial separation that is 8 mm x 71mm. No swelling but increased heat to the area above the separation.    Assessment:  Post-Op 3 weeks s/p post operative pelvic hematoma   minimal incision separation, inferior incision  Possible cellulitis of incision. Doing  Adequately  postoperatively.   Plan:  To treat with band aids or steri strips along with topical neosporin. Will order a new antibiotic.  1.Wound care discussed   2.Continue any current medications.  Will renew  Pain meds, add antibiotic as precaution. 3. Activity restrictions: not ready for return to work, will take at least 6 wks, possibly 8 wk.from surgery. 4. return to work:reevaluate at 2-3 weeksfrom today 5. Follow up in 3 weeks.

## 2013-08-25 NOTE — Patient Instructions (Signed)
You will be out of work 6 weeks. Reevaluation of Return to work date to be reviewed at followup visit 3 wk.

## 2013-09-15 ENCOUNTER — Ambulatory Visit (INDEPENDENT_AMBULATORY_CARE_PROVIDER_SITE_OTHER): Payer: BC Managed Care – PPO | Admitting: Obstetrics and Gynecology

## 2013-09-15 ENCOUNTER — Encounter: Payer: Self-pay | Admitting: Obstetrics and Gynecology

## 2013-09-15 VITALS — BP 142/84 | Ht 64.0 in | Wt 228.6 lb

## 2013-09-15 DIAGNOSIS — Z9889 Other specified postprocedural states: Secondary | ICD-10-CM

## 2013-09-15 DIAGNOSIS — IMO0002 Reserved for concepts with insufficient information to code with codable children: Secondary | ICD-10-CM

## 2013-09-15 NOTE — Progress Notes (Signed)
Patient ID: Robin Sherman, female   DOB: 1960/11/05, 53 y.o.   MRN: [id number]    Subjective:  Robin Sherman is a 53 y.o. female who presents to the clinic 6 weeks status post supracervical hysterectomy and reoperation POD #2 for hematoma, now 6 wks, desires to RTW..   NOt yet sexually active. To RTW 3.4.15 Review of Systems Negative except incision pulls a bit with activity  She has been eating a regular diet without difficulty.   Bowel movements are normal. Pain is controlled with current analgesics. Medications being used: ibuprofen (OTC).  Objective:  BP 142/84  Ht 5\' 4"  (1.626 m)  Wt 228 lb 9.6 oz (103.692 kg)  BMI 39.22 kg/m2  LMP 04/15/2013 General:Well developed, well nourished.  No acute distress. Abdomen: Bowel sounds normal, soft, non-tender. Pelvic Exam:    External Genitalia:  Normal.    Vagina: Normal    Bimanual: Normal    Cervix: Normal    Uterus: absent,, Normal support of pelvis    Adnexa: nontender,Normal  Incision(s):   Healing well, no drainage, no erythema, no hernia, no swelling, no dehiscence, incision well approximated.some scarring,    Assessment:  Post-Op 6 weeks s/p supracervical hysterectomy and evacuation of hematoma   release to RTW 09/17/13 Doing well postoperatively.   Plan:  1.Wound care discussed   2. .Continue any current medications. 3. Activity restrictions: none 4. return to work: now. 5. Follow up in 1 yr.

## 2013-09-15 NOTE — Patient Instructions (Signed)
Pap smears will be in one year, then every 3 yrs. We will see you as needed for gyn problems. Please be sure your family doctor does breast exam yearly and annual rectal exams checking for hemoccult test.

## 2013-10-01 ENCOUNTER — Emergency Department (HOSPITAL_COMMUNITY)
Admission: EM | Admit: 2013-10-01 | Discharge: 2013-10-01 | Disposition: A | Discharge status: Home / Self Care | Payer: BC Managed Care – PPO | Attending: Emergency Medicine | Admitting: Emergency Medicine

## 2013-10-01 ENCOUNTER — Encounter (HOSPITAL_COMMUNITY): Payer: Self-pay | Admitting: Emergency Medicine

## 2013-10-01 ENCOUNTER — Emergency Department (HOSPITAL_COMMUNITY): Payer: BC Managed Care – PPO

## 2013-10-01 DIAGNOSIS — F17200 Nicotine dependence, unspecified, uncomplicated: Secondary | ICD-10-CM | POA: Insufficient documentation

## 2013-10-01 DIAGNOSIS — S93409A Sprain of unspecified ligament of unspecified ankle, initial encounter: Secondary | ICD-10-CM | POA: Insufficient documentation

## 2013-10-01 DIAGNOSIS — X501XXA Overexertion from prolonged static or awkward postures, initial encounter: Secondary | ICD-10-CM

## 2013-10-01 DIAGNOSIS — J45909 Unspecified asthma, uncomplicated: Secondary | ICD-10-CM | POA: Insufficient documentation

## 2013-10-01 DIAGNOSIS — M7120 Synovial cyst of popliteal space [Baker], unspecified knee: Secondary | ICD-10-CM | POA: Insufficient documentation

## 2013-10-01 DIAGNOSIS — Y929 Unspecified place or not applicable: Secondary | ICD-10-CM | POA: Insufficient documentation

## 2013-10-01 DIAGNOSIS — Y9301 Activity, walking, marching and hiking: Secondary | ICD-10-CM | POA: Insufficient documentation

## 2013-10-01 DIAGNOSIS — Z79899 Other long term (current) drug therapy: Secondary | ICD-10-CM | POA: Insufficient documentation

## 2013-10-01 DIAGNOSIS — T733XXA Exhaustion due to excessive exertion, initial encounter: Secondary | ICD-10-CM | POA: Insufficient documentation

## 2013-10-01 DIAGNOSIS — Z872 Personal history of diseases of the skin and subcutaneous tissue: Secondary | ICD-10-CM | POA: Insufficient documentation

## 2013-10-01 DIAGNOSIS — M25562 Pain in left knee: Secondary | ICD-10-CM

## 2013-10-01 DIAGNOSIS — Z792 Long term (current) use of antibiotics: Secondary | ICD-10-CM | POA: Insufficient documentation

## 2013-10-01 DIAGNOSIS — I10 Essential (primary) hypertension: Secondary | ICD-10-CM | POA: Insufficient documentation

## 2013-10-01 MED ORDER — IBUPROFEN 800 MG PO TABS: 800.0000 mg | ORAL_TABLET | Freq: Three times a day (TID) | ORAL | Status: DC

## 2013-10-01 MED ORDER — OXYCODONE-ACETAMINOPHEN 5-325 MG PO TABS: 1.0000 | ORAL_TABLET | ORAL | Status: DC | PRN

## 2013-10-01 NOTE — Discharge Instructions (Signed)
Baker Cyst A Baker cyst is a sac-like structure that forms in the back of the knee. It is filled with the same fluid that is located in your knee. This fluid lubricates the bones and cartilage of the knee and allows them to move over each other more easily. CAUSES  When the knee becomes injured or inflamed, increased fluid forms in the knee. When this happens, the joint lining is pushed out behind the knee and forms the Baker cyst. This cyst may also be caused by inflammation from arthritic conditions and infections. SIGNS AND SYMPTOMS  A Baker cyst usually has no symptoms. When the cyst is substantially enlarged:  You may feel pressure behind the knee, stiffness in the knee, or a mass in the area behind the knee.  You may develop pain, redness, and swelling in the calf. This can suggest a blood clot and requires evaluation by your health care provider. DIAGNOSIS  A Baker cyst is most often found during an ultrasound exam. This exam may have been performed for other reasons, and the cyst was found incidentally. Sometimes an MRI is used. This picks up other problems within a joint that an ultrasound exam may not. If the Baker cyst developed immediately after an injury, X-ray exams may be used to diagnose the cyst. TREATMENT  The treatment depends on the cause of the cyst. Anti-inflammatory medicines and rest often will be prescribed. If the cyst is caused by a bacterial infection, antibiotic medicines may be prescribed.  HOME CARE INSTRUCTIONS   If the cyst was caused by an injury, for the first 24 hours, keep the injured leg elevated on 2 pillows while lying down.  For the first 24 hours while you are awake, apply ice to the injured area:  Put ice in a plastic bag.  Place a towel between your skin and the bag.  Leave the ice on for 20 minutes, 2 3 times a day.  Only take over-the-counter or prescription medicines for pain, discomfort, or fever as directed by your health care  provider.  Only take antibiotic medicine as directed. Make sure to finish it even if you start to feel better. MAKE SURE YOU:   Understand these instructions.  Will watch your condition.  Will get help right away if you are not doing well or get worse. Document Released: 07/03/2005 Document Revised: 04/23/2013 Document Reviewed: 02/12/2013 [facility] Patient Information 2014 [place].  Sprain A sprain happens when the bands of tissue that connect bones and hold joints together (ligaments) stretch too much or tear. HOME CARE  Raise (elevate) the injured area to lessen puffiness (swelling).  Put ice on the injured area 2 times a day for 2 3 days.  Put ice in a plastic bag.  Place a towel between your skin and the bag.  Leave the ice on for 15 minutes.  Only take medicine as told by your doctor.  Protect your injured area until your pain and stiffness go away.  Do not get your cast or splint wet. Cover your cast or splint with a plastic bag when you shower or take a bath. Do not swim in a pool.  Your doctor may suggest exercises during your recovery to keep from getting stiff. GET HELP RIGHT AWAY IF:   Your cast or splint becomes damaged.  Your pain gets worse. MAKE SURE YOU:   Understand these instructions.  Will watch this condition.  Will get help right away if you are not doing well or get  worse. Document Released: 12/20/2007 Document Revised: 04/23/2013 Document Reviewed: 07/15/2011 [facility] Patient Information 2014 [place], [place].

## 2013-10-01 NOTE — ED Notes (Signed)
Pt in Ultrasound

## 2013-10-01 NOTE — ED Notes (Signed)
Pt reports started having pain from left knee down to ankle since last Friday.  Reports worked a 12 hour shift last night and when she got in the car today, left leg "gave out."  Pt says was able to drive home using her r leg but when she got out of the car, leg gave out again.  Pt says feels like a lot of pressure and throbbing from left knee down to ankle.

## 2013-10-01 NOTE — ED Notes (Signed)
Ultrasound complete 

## 2013-10-04 NOTE — ED Provider Notes (Signed)
CSN: [id number]     Arrival date & time 10/01/13  1346 History   First MD Initiated Contact with Patient 10/01/13 1618     Chief Complaint  Patient presents with  . Leg Pain     (Consider location/radiation/quality/duration/timing/severity/associated sxs/prior Treatment) HPI Comments: AHLIA LEMANSKI is a 53 y.o. female who presents to the Emergency Department complaining of left lower leg pain for 5 days.  She describes an aching pain from the back of the left knee down through her ankle.  States that she recently returned to work and pain began after excessive standing for 12 hours.  Also states that her left knee "gave out" while walking and again when getting out of her car.  She denies previous injury, fall, or hx of DVT.  Pain is improved with rest and worse with weight bearing.  She also denies fever, chills, numbness or weakness of the extremity.  The history is provided by the patient.    Past Medical History  Diagnosis Date  . Hypertension   . Asthma   . Eczema     Followed by Dr. Nevada Crane dermatology   Past Surgical History  Procedure Laterality Date  . Tubal ligation      [facility]  . Colonoscopy N/A 01/27/2013    Procedure: COLONOSCOPY;  Surgeon: Danie Binder, MD;  Location: [facility];  Service: Endoscopy;  Laterality: N/A;  8:30 AM  . Supracervical abdominal hysterectomy N/A 08/01/2013    Procedure: HYSTERECTOMY SUPRACERVICAL ABDOMINAL;  Surgeon: Jonnie Kind, MD;  Location: [facility] ORS;  Service: Gynecology;  Laterality: N/A;  . Bilateral salpingectomy Bilateral 08/01/2013    Procedure: BILATERAL SALPINGECTOMY;  Surgeon: Jonnie Kind, MD;  Location: [facility] OR[facility];  Service: Gynecology;  Laterality: Bilateral;  . Hematoma evacuation N/A 08/03/2013    Procedure: EVACUATION PELVIC HEMATOMA;  Surgeon: Jonnie Kind, MD;  Location: [facility] OR[facility];  Service: Gynecology;  Laterality: N/A;  . Abdominal hysterectomy     Family History  Problem Relation Age of Onset  . Heart  disease Mother     MI  . Stroke Father   . Heart disease Father     MI  . Cancer Sister     breast  . Colon cancer Neg Hx    History  Substance Use Topics  . Smoking status: Current Every Day Smoker -- 0.10 packs/day for 20 years    Types: Cigarettes  . Smokeless tobacco: Never Used  . Alcohol Use: Yes     Comment: Rare   OB History   Grav Para Term Preterm Abortions TAB SAB Ect Mult Living                 Review of Systems  Constitutional: Negative for fever and chills.  Genitourinary: Negative for dysuria and difficulty urinating.  Musculoskeletal: Positive for arthralgias and gait problem. Negative for back pain, joint swelling and neck pain.  Skin: Negative for color change and wound.  All other systems reviewed and are negative.      Allergies  Codeine and Naproxen  Home Medications   Current Outpatient Rx  Name  Route  Sig  Dispense  Refill  . albuterol (PROVENTIL HFA;VENTOLIN HFA) 108 (90 BASE) MCG/ACT inhaler   Inhalation   Inhale 2 puffs into the lungs every 4 (four) hours as needed for wheezing or shortness of breath.   1 Inhaler   3   . amLODipine (NORVASC) 10 MG tablet   Oral   Take 1  tablet (10 mg total) by mouth daily.   30 tablet   6   . ciprofloxacin (CIPRO) 500 MG tablet   Oral   Take 1 tablet (500 mg total) by mouth 2 (two) times daily.   14 tablet   0   . halobetasol (ULTRAVATE) 0.05 % cream   Topical   Apply topically 2 (two) times daily.   50 g   3   . ibuprofen (ADVIL,MOTRIN) 800 MG tablet   Oral   Take 1 tablet (800 mg total) by mouth 3 (three) times daily.   21 tablet   0   . oxyCODONE-acetaminophen (PERCOCET/ROXICET) 5-325 MG per tablet   Oral   Take 1-2 tablets by mouth every 6 (six) hours as needed for moderate pain or severe pain.   30 tablet   0   . oxyCODONE-acetaminophen (PERCOCET/ROXICET) 5-325 MG per tablet   Oral   Take 1 tablet by mouth every 4 (four) hours as needed for severe pain.   20 tablet   0      BP 125/83  Pulse 78  Temp(Src) 98.3 F (36.8 C)  Resp 18  Ht 5\' 4"  (1.626 m)  Wt 230 lb (104.327 kg)  BMI 39.46 kg/m2  SpO2 98%  LMP 04/15/2013 Physical Exam  Nursing note and vitals reviewed. Constitutional: She is oriented to person, place, and time. She appears well-developed and well-nourished. No distress.  Cardiovascular: Normal rate, regular rhythm, normal heart sounds and intact distal pulses.   Pulmonary/Chest: Effort normal and breath sounds normal.  Musculoskeletal: She exhibits tenderness. She exhibits no edema.       Left lower leg: She exhibits tenderness. She exhibits no swelling, no edema, no deformity and no laceration.       Legs: ttp of the posterior left lower leg and the popliteal fossa.  No erythema, effusion, or step-off deformity.  DP pulse brisk, distal sensation intact. Compartments soft.  Left hip is NT.    Neurological: She is alert and oriented to person, place, and time. She exhibits normal muscle tone. Coordination normal.  Skin: Skin is warm and dry. No erythema.    ED Course  Procedures (including critical care time) Labs Review Labs Reviewed - No data to display Imaging Review US Venous Img Lower Unilateral Left  10/01/2013   CLINICAL DATA:  Left leg pain.  EXAM: LEFT LOWER EXTREMITY VENOUS DOPPLER ULTRASOUND  TECHNIQUE: Gray-scale sonography with graded compression, as well as color Doppler and duplex ultrasound, were performed to evaluate the deep venous system from the level of the common femoral vein through the popliteal and proximal calf veins. Spectral Doppler was utilized to evaluate flow at rest and with distal augmentation maneuvers.  COMPARISON:  None.  FINDINGS: Thrombus within deep veins:  None visualized.  Normal compressibility, augmentation and color Doppler flow in the left common femoral vein, left femoral vein and left popliteal vein. Visualized calf veins are patent. Visualized great saphenous vein is patent.  Other findings:  There is an irregular shaped hypoechoic structure in the posterior knee. This structure measures 3.2 x 1.9 x 3.7 cm.  IMPRESSION: Negative for left lower extremity DVT.  Hypoechoic structure in the posterior knee. Findings are most compatible with a Baker's cyst.   Electronically Signed   By: Markus Daft M.D.   On: 10/01/2013 17:21     EKG Interpretation None      MDM   Final diagnoses:  Knee pain, left  Ankle sprain  Baker's cyst of knee  Knee immobilizer and ASO applied for support at patient's request.  Pain improved.    Patient is ambulatory.  NV intact.  No concerning sx's for cellulitis.  [place] negative for DVT.  Pain likely related to Baker's cyst.  Pt agrees to symptomatic tx and referral given for orthopedics.    Pt is well appearing, agrees to plan and appears stable for d/c.      Thurmond Hildebran L. Vanessa [place], PA-C 10/04/13 1252

## 2013-10-05 NOTE — ED Provider Notes (Signed)
Medical screening examination/treatment/procedure(s) were performed by non-physician practitioner and as supervising physician I was immediately available for consultation/collaboration.   EKG Interpretation None       Shariff Lasky, MD 10/05/13 0823 

## 2013-10-13 ENCOUNTER — Ambulatory Visit (INDEPENDENT_AMBULATORY_CARE_PROVIDER_SITE_OTHER): Payer: BC Managed Care – PPO | Admitting: Orthopedic Surgery

## 2013-10-13 ENCOUNTER — Ambulatory Visit (INDEPENDENT_AMBULATORY_CARE_PROVIDER_SITE_OTHER): Payer: BC Managed Care – PPO

## 2013-10-13 ENCOUNTER — Encounter: Payer: Self-pay | Admitting: Orthopedic Surgery

## 2013-10-13 VITALS — BP 141/90 | Ht 64.0 in | Wt 232.0 lb

## 2013-10-13 DIAGNOSIS — M5432 Sciatica, left side: Secondary | ICD-10-CM

## 2013-10-13 DIAGNOSIS — M25562 Pain in left knee: Secondary | ICD-10-CM

## 2013-10-13 DIAGNOSIS — M25462 Effusion, left knee: Secondary | ICD-10-CM | POA: Insufficient documentation

## 2013-10-13 DIAGNOSIS — M25569 Pain in unspecified knee: Secondary | ICD-10-CM

## 2013-10-13 DIAGNOSIS — M1710 Unilateral primary osteoarthritis, unspecified knee: Secondary | ICD-10-CM

## 2013-10-13 DIAGNOSIS — M25469 Effusion, unspecified knee: Secondary | ICD-10-CM

## 2013-10-13 DIAGNOSIS — M179 Osteoarthritis of knee, unspecified: Secondary | ICD-10-CM

## 2013-10-13 DIAGNOSIS — IMO0002 Reserved for concepts with insufficient information to code with codable children: Secondary | ICD-10-CM

## 2013-10-13 DIAGNOSIS — M5430 Sciatica, unspecified side: Secondary | ICD-10-CM

## 2013-10-13 HISTORY — DX: Sciatica, left side: M54.32

## 2013-10-13 NOTE — Patient Instructions (Addendum)
JOINT EFFUSION  KNEE ARTHRITIS SCIATICA PINCHED NERVE   Knee Effusion  Knee effusion means you have fluid in your knee. The knee may be more difficult to bend and move. HOME CARE  Use crutches or a brace as told by your doctor.  Put ice on the injured area.  Put ice in a plastic bag.  Place a towel between your skin and the bag.  Leave the ice on for 15-20 minutes, 03-04 times a day.  Raise (elevate) your knee as much as possible.  Only take medicine as told by your doctor.  You may need to do strengthening exercises. Ask your doctor.  Continue with your normal diet and activities as told by your doctor. GET HELP RIGHT AWAY IF:  You have more puffiness (swelling) in your knee.  You see redness, puffiness, or have more pain in your knee.  You have a temperature by mouth above 102 F (38.9 C).  You get a rash.  You have trouble breathing.  You have a reaction to any medicine you are taking.  You have a lot of pain when you move your knee. MAKE SURE YOU:  Understand these instructions.  Will watch your condition.  Will get help right away if you are not doing well or get worse. Document Released: 08/05/2010 Document Revised: 09/25/2011 Document Reviewed: 08/05/2010 [facility] Patient Information 2014 [place], [place]. Sciatica Sciatica is pain, weakness, numbness, or tingling along your sciatic nerve. The nerve starts in the lower back and runs down the back of each leg. Nerve damage or certain conditions pinch or put pressure on the sciatic nerve. This causes the pain, weakness, and other discomforts of sciatica. HOME CARE   Only take medicine as told by your doctor.  Apply ice to the affected area for 20 minutes. Do this 3 4 times a day for the first 48 72 hours. Then try heat in the same way.  Exercise, stretch, or do your usual activities if these do not make your pain worse.  Go to physical therapy as told by your doctor.  Keep all doctor visits as  told.  Do not wear high heels or shoes that are not supportive.  Get a firm mattress if your mattress is too soft to lessen pain and discomfort. GET HELP RIGHT AWAY IF:   You cannot control when you poop (bowel movement) or pee (urinate).  You have more weakness in your lower back, lower belly (pelvis), butt (buttocks), or legs.  You have redness or puffiness (swelling) of your back.  You have a burning feeling when you pee.  You have pain that gets worse when you lie down.  You have pain that wakes you from your sleep.  Your pain is worse than past pain.  Your pain lasts longer than 4 weeks.  You are suddenly losing weight without reason. MAKE SURE YOU:   Understand these instructions.  Will watch this condition.  Will get help right away if you are not doing well or get worse. Document Released: 04/11/2008 Document Revised: 01/02/2012 Document Reviewed: 11/12/2011 [facility] Patient Information 2014 [place]. Wear and Tear Disorders of the Knee (Arthritis, Osteoarthritis) Everyone will experience wear and tear injuries (arthritis, osteoarthritis) of the knee. These are the changes we all get as we age. They come from the joint stress of daily living. The amount of cartilage damage in your knee and your symptoms determine if you need surgery. Mild problems require approximately two months recovery time. More severe problems take several  months to recover. With mild problems, your surgeon may find worn and rough cartilage surfaces. With severe changes, your surgeon may find cartilage that has completely worn away and exposed the bone. Loose bodies of bone and cartilage, bone spurs (excess bone growth), and injuries to the menisci (cushions between the large bones of your leg) are also common. All of these problems can cause pain. For a mild wear and tear problem, rough cartilage may simply need to be shaved and smoothed. For more severe problems with areas of exposed bone,  your surgeon may use an instrument for roughing up the bone surfaces to stimulate new cartilage growth. Loose bodies are usually removed. Torn menisci may be trimmed or repaired. ABOUT THE ARTHROSCOPIC PROCEDURE Arthroscopy is a surgical technique. It allows your orthopedic surgeon to diagnose and treat your knee injury with accuracy. The surgeon looks into your knee through a small scope. The scope is like a small (pencil-sized) telescope. Arthroscopy is less invasive than open knee surgery. You can expect a more rapid recovery. After the procedure, you will be moved to a recovery area until most of the effects of the medication have worn off. Your caregiver will discuss the test results with you. RECOVERY The severity of the arthritis and the type of procedure performed will determine recovery time. Other important factors include age, physical condition, medical conditions, and the type of rehabilitation program. Strengthening your muscles after arthroscopy helps guarantee a better recovery. Follow your caregiver's instructions. Use crutches, rest, elevate, ice, and do knee exercises as instructed. Your caregivers will help you and instruct you with exercises and other physical therapy required to regain your mobility, muscle strength, and functioning following surgery. Only take over-the-counter or prescription medicines for pain, discomfort, or fever as directed by your caregiver.  SEEK MEDICAL CARE IF:   There is increased bleeding (more than a small spot) from the wound.  You notice redness, swelling, or increasing pain in the wound.  Pus is coming from wound.  You develop an unexplained oral temperature above 102 F (38.9 C) , or as your caregiver suggests.  You notice a foul smell coming from the wound or dressing.  You have severe pain with motion of the knee. SEEK IMMEDIATE MEDICAL CARE IF:   You develop a rash.  You have difficulty breathing.  You have any allergic  problems. MAKE SURE YOU:   Understand these instructions.  Will watch your condition.  Will get help right away if you are not doing well or get worse. Document Released: 06/30/2000 Document Revised: 09/25/2011 Document Reviewed: 11/27/2007 [facility] Patient Information 2014 [place], [place].

## 2013-10-13 NOTE — Progress Notes (Signed)
   Subjective:    Patient ID: Robin Sherman, female    DOB: [birth year], 53 y.o.   MRN: [id number]  HPI Comments: Left knee pain no trauma  Started after standing 12 hrs , with pain running down leg from back, then settled in knee, had trouble walking; went to ER   Walking, standing  Rest : better   Knee Pain  The incident occurred more than 1 week ago. The incident occurred at home. There was no injury mechanism. The pain is present in the left leg and left knee. The quality of the pain is described as stabbing (radiating ). The pain is at a severity of 10/10.      Review of Systems  Constitutional: Positive for fatigue.  All other systems reviewed and are negative.       Objective:   Physical Exam BP 141/90  Ht 5\' 4"  (1.626 m)  Wt 232 lb (105.235 kg)  BMI 39.80 kg/m2  LMP 04/15/2013  General the patient is well-developed and well-nourished grooming and hygiene are normal Oriented x3 Mood and affect normal Ambulation normal  Inspection of the lumbar spine non tender SLR pain at 60 degrees. Reflexes normal   Upper extremity exam  The right and left upper extremity:   Inspection and palpation revealed no abnormalities in the upper extremities.   Range of motion is full without contracture.  Motor exam is normal with grade 5 strength.  The joints are fully reduced without subluxation.  There is no atrophy or tremor and muscle tone is normal.  All joints are stable.    Right knee Full range of motion All joints are stable Motor exam is normal Skin clean dry and intact  Right knee:  ROM=115 TIGHT, MEDIAL JOINT LINE TENDER LIGAMENTS STABLE MOTOR EXAM NORMAL  [facility] ? Cardiovascular exam is normal Sensory exam normal   X RAYS MEDIAL JOINT SPACE NARROWING      Assessment & Plan:   Encounter Diagnoses  Name Primary?  . Left knee pain Yes  . Effusion of knee joint, left   . Arthritis of knee, degenerative   . Sciatica of left side      Procedure Injection LEFT knee Medications: Ethyl chloride for topical anesthetic. Lidocaine 1% 3 cc. 40 mg of Depo-Medrol per mL, 1 mL. Verbal consent. Timeout to confirm site of injection as LEFT  knee Alcohol was used to clean the skin followed by ethyl chloride to anesthetize the skin. A lateral approach was used to inject the knee with lidocaine and Depo-Medrol.  No complications were noted. A sterile bandage was applied.  REC 12 DAY DS DOSE PACK   F/U PRN

## 2013-10-29 ENCOUNTER — Encounter: Payer: Self-pay | Admitting: Family Medicine

## 2013-10-29 ENCOUNTER — Ambulatory Visit (INDEPENDENT_AMBULATORY_CARE_PROVIDER_SITE_OTHER): Payer: BC Managed Care – PPO | Admitting: Family Medicine

## 2013-10-29 VITALS — BP 144/98 | HR 84 | Temp 98.2°F | Resp 20 | Ht 63.0 in | Wt 227.0 lb

## 2013-10-29 DIAGNOSIS — M25569 Pain in unspecified knee: Secondary | ICD-10-CM

## 2013-10-29 DIAGNOSIS — G4700 Insomnia, unspecified: Secondary | ICD-10-CM

## 2013-10-29 DIAGNOSIS — M25562 Pain in left knee: Secondary | ICD-10-CM

## 2013-10-29 DIAGNOSIS — M1710 Unilateral primary osteoarthritis, unspecified knee: Secondary | ICD-10-CM

## 2013-10-29 DIAGNOSIS — IMO0002 Reserved for concepts with insufficient information to code with codable children: Secondary | ICD-10-CM

## 2013-10-29 DIAGNOSIS — I10 Essential (primary) hypertension: Secondary | ICD-10-CM

## 2013-10-29 DIAGNOSIS — M179 Osteoarthritis of knee, unspecified: Secondary | ICD-10-CM

## 2013-10-29 MED ORDER — IBUPROFEN 800 MG PO TABS: 800.0000 mg | ORAL_TABLET | Freq: Three times a day (TID) | ORAL | Status: DC

## 2013-10-29 MED ORDER — ZOLPIDEM TARTRATE 10 MG PO TABS: 10.0000 mg | ORAL_TABLET | Freq: Every evening | ORAL | Status: DC | PRN

## 2013-10-29 MED ORDER — OXYCODONE-ACETAMINOPHEN 5-325 MG PO TABS: 1.0000 | ORAL_TABLET | Freq: Four times a day (QID) | ORAL | Status: DC | PRN

## 2013-10-29 MED ORDER — HYDROCHLOROTHIAZIDE 25 MG PO TABS
25.0000 mg | ORAL_TABLET | Freq: Every day | ORAL | Status: DC
Start: 2013-10-29 — End: 2014-06-06

## 2013-10-29 NOTE — Patient Instructions (Addendum)
MRI of knee  Start HCTZ  Continue the norvasc Try on Ambien take 1 hour before bedtime F/u Monday for recheck

## 2013-10-29 NOTE — Assessment & Plan Note (Signed)
Blood pressure deteriorated I will have her restart hydrochlorothiazide 25 mg once a day continue amlodipine 10 mg followup on Monday

## 2013-10-29 NOTE — Assessment & Plan Note (Signed)
Insomnia secondary to shift work. I will start her on Ambien 10 mg

## 2013-10-29 NOTE — Assessment & Plan Note (Signed)
Worsening knee pain with some degenerative arthritis of the knee. I will obtain an MRI of the knee she did have a steroid injection but continues to have pain and swelling. We need to rule out tear of the meniscus.

## 2013-10-29 NOTE — Progress Notes (Signed)
Patient ID: Robin Sherman, female   DOB: 1960/09/10, 53 y.o.   MRN: [id number]   Subjective:    Patient ID: Robin Sherman, female    DOB: [birth year], 53 y.o.   MRN: [id number]  Patient presents for BP management and L leg pain  Patient here with elevated blood pressure headache for the past 3 days. She's been taking her blood pressure at work it has ranged 086 systolics over the low 578I diastolic. She has been taking her amlodipine as prescribed. She also complains of feeling a little lightheaded with her blood pressure but no chest pain shortness of breath no change in vision. She's not taking anything for the headache.  She continues to have left knee pain she did have an episode where her knee gave out on her at work that resulted in her going to the emergency room x-rays were done which showed arthritis of the knee. She did see orthopedics on March 30 at that time steroid injection was done with a diagnosis of degenerative changes in the knee she continues to have pain in the knee continues to give out and swells. She is taking ibuprofen which she tolerates as well as Percocet as needed for pain which helps. She also has a knee stabilizer brace that she has difficulty keeping on because it rolls down.  Insomnia-works third shift and has difficulty falling asleep. She is only getting about 3-4 hours of sleep and feels very fatigued while at work. She's not drinking any caffeine she has all of the TV and lites and other disturbances off however still cannot sleep. It is starting to affect her work   Review Of Systems:  GEN- denies fatigue, fever, weight loss,weakness, recent illness HEENT- denies eye drainage, change in vision, nasal discharge, CVS- denies chest pain, palpitations RESP- denies SOB, cough, wheeze ABD- denies N/V, change in stools, abd pain GU- denies dysuria, hematuria, dribbling, incontinence MSK- +joint pain, muscle aches, injury Neuro- + headache, dizziness, syncope,  seizure activity       Objective:    BP 144/98  Pulse 84  Temp(Src) 98.2 F (36.8 C) (Oral)  Resp 20  Ht 5\' 3"  (1.6 m)  Wt 227 lb (102.967 kg)  BMI 40.22 kg/m2  LMP 04/15/2013 GEN- NAD, alert and oriented x3 HEENT- PERRL, EOMI, non injected sclera, pink conjunctiva, MMM, oropharynx clear, fundus benign Neck- Supple, CVS- RRR, no murmur RESP-CTAB MSK- Right knee- normal inspection, good ROM, Left knee- mild swelling, TTP inferior and lateral to patella, ligaments appear in tact, +crepitus, fair ROM, antalgic gait Neuro- CNII-XII in tact, no deficits EXT- trace left pedal edema Pulses- Radial, DP- 2+        Assessment & Plan:      Problem List Items Addressed This Visit   Left knee pain     Worsening knee pain with some degenerative arthritis of the knee. I will obtain an MRI of the knee she did have a steroid injection but continues to have pain and swelling. We need to rule out tear of the meniscus.    Insomnia     Insomnia secondary to shift work. I will start her on Ambien 10 mg    Essential hypertension, benign - Primary     Blood pressure deteriorated I will have her restart hydrochlorothiazide 25 mg once a day continue amlodipine 10 mg followup on Monday    Relevant Medications      hydrochlorothiazide tablet   Arthritis of knee, degenerative  PER ABOVE MRI of knee    Relevant Medications      ibuprofen (MOTRIN) tablet      oxyCODONE-acetaminophen (PERCOCET/ROXICET) 5-325 MG per tablet      Note: This dictation was prepared with Dragon dictation along with smaller phrase technology. Any transcriptional errors that result from this process are unintentional.

## 2013-10-29 NOTE — Assessment & Plan Note (Signed)
PER ABOVE MRI of knee

## 2013-10-31 ENCOUNTER — Encounter (HOSPITAL_COMMUNITY): Payer: Self-pay

## 2013-10-31 ENCOUNTER — Other Ambulatory Visit: Payer: Self-pay | Admitting: Family Medicine

## 2013-10-31 ENCOUNTER — Ambulatory Visit (HOSPITAL_COMMUNITY)
Admission: RE | Admit: 2013-10-31 | Discharge: 2013-10-31 | Disposition: A | Discharge status: Home / Self Care | Payer: BC Managed Care – PPO | Source: Ambulatory Visit | Attending: Family Medicine | Admitting: Family Medicine

## 2013-10-31 DIAGNOSIS — M25562 Pain in left knee: Secondary | ICD-10-CM

## 2013-10-31 DIAGNOSIS — IMO0002 Reserved for concepts with insufficient information to code with codable children: Secondary | ICD-10-CM | POA: Insufficient documentation

## 2013-10-31 DIAGNOSIS — X58XXXA Exposure to other specified factors, initial encounter: Secondary | ICD-10-CM | POA: Insufficient documentation

## 2013-10-31 DIAGNOSIS — M179 Osteoarthritis of knee, unspecified: Secondary | ICD-10-CM

## 2013-10-31 DIAGNOSIS — M1710 Unilateral primary osteoarthritis, unspecified knee: Secondary | ICD-10-CM

## 2013-10-31 DIAGNOSIS — M25569 Pain in unspecified knee: Secondary | ICD-10-CM

## 2013-10-31 DIAGNOSIS — S83209A Unspecified tear of unspecified meniscus, current injury, unspecified knee, initial encounter: Secondary | ICD-10-CM

## 2013-10-31 DIAGNOSIS — M25469 Effusion, unspecified knee: Secondary | ICD-10-CM | POA: Insufficient documentation

## 2013-11-03 ENCOUNTER — Ambulatory Visit (INDEPENDENT_AMBULATORY_CARE_PROVIDER_SITE_OTHER): Payer: BC Managed Care – PPO | Admitting: Family Medicine

## 2013-11-03 ENCOUNTER — Encounter: Payer: Self-pay | Admitting: Family Medicine

## 2013-11-03 VITALS — BP 144/90 | HR 84 | Temp 98.2°F | Resp 16 | Ht 63.0 in | Wt 225.0 lb

## 2013-11-03 DIAGNOSIS — IMO0002 Reserved for concepts with insufficient information to code with codable children: Secondary | ICD-10-CM

## 2013-11-03 DIAGNOSIS — M7052 Other bursitis of knee, left knee: Secondary | ICD-10-CM

## 2013-11-03 DIAGNOSIS — I10 Essential (primary) hypertension: Secondary | ICD-10-CM

## 2013-11-03 DIAGNOSIS — M76899 Other specified enthesopathies of unspecified lower limb, excluding foot: Secondary | ICD-10-CM

## 2013-11-03 DIAGNOSIS — M1710 Unilateral primary osteoarthritis, unspecified knee: Secondary | ICD-10-CM

## 2013-11-03 DIAGNOSIS — M179 Osteoarthritis of knee, unspecified: Secondary | ICD-10-CM

## 2013-11-03 NOTE — Patient Instructions (Signed)
Continue the blood pressure medications Call if you need to see the orthopedist about your knee Continue pain medication and ibuprofen CHANGE THE F/U TO July

## 2013-11-03 NOTE — Assessment & Plan Note (Signed)
Blood pressure is improved I will continue current regimen

## 2013-11-03 NOTE — Assessment & Plan Note (Signed)
She has bursitis and a posterior meniscal tear. She wants to hold on intervention at this time. She has Percocet as well as ibuprofen which she does tolerate with food. We will hold on orthopedics Will she has worsening problems here she will also continue her knee stabilizer brace

## 2013-11-03 NOTE — Progress Notes (Signed)
Patient ID: Robin Sherman, female   DOB: 01/04/1961, 53 y.o.   MRN: [id number]   Subjective:    Patient ID: Robin Sherman, female    DOB: 08/03/1960, 53 y.o.   MRN: [id number]  Patient presents for re-check BP  Here to followup blood pressure. She was seen last week secondary to some headaches and elevated blood pressure. She also had some right knee pain. Her blood pressure is improved today she is taking Norvasc and hydrochlorothiazide without any difficulties   Her MRI of her left knee showed a posterior meniscal tear as well as arthritis and bursitis. She's currently using Percocet and ibuprofen. She was to hold off on orthopedics at this time states she cannot afford any surgeries or any other visits. She has been wearing a knee brace which helps when she is at work. She does not have any swelling of the weekend. She still does not remember any particular injury   Review Of Systems:  GEN- denies fatigue, fever, weight loss,weakness, recent illness HEENT- denies eye drainage, change in vision, nasal discharge, CVS- denies chest pain, palpitations RESP- denies SOB, cough, wheeze MSK- + joint pain, muscle aches, injury Neuro- denies headache, dizziness, syncope, seizure activity       Objective:    BP 144/90  Pulse 84  Temp(Src) 98.2 F (36.8 C) (Oral)  Resp 16  Ht 5\' 3"  (1.6 m)  Wt 225 lb (102.059 kg)  BMI 39.87 kg/m2  LMP 04/15/2013 GEN- NAD, alert and oriented x3 Vitals reviewed        Assessment & Plan:      Problem List Items Addressed This Visit   None      Note: This dictation was prepared with Dragon dictation along with smaller phrase technology. Any transcriptional errors that result from this process are unintentional.

## 2013-11-03 NOTE — Assessment & Plan Note (Signed)
Per above for knee

## 2013-11-14 ENCOUNTER — Encounter: Payer: Self-pay | Admitting: Orthopedic Surgery

## 2013-12-12 ENCOUNTER — Encounter: Payer: BC Managed Care – PPO | Admitting: Family Medicine

## 2013-12-22 ENCOUNTER — Encounter: Payer: Self-pay | Admitting: Family Medicine

## 2013-12-22 ENCOUNTER — Ambulatory Visit (INDEPENDENT_AMBULATORY_CARE_PROVIDER_SITE_OTHER): Payer: BC Managed Care – PPO | Admitting: Family Medicine

## 2013-12-22 VITALS — BP 138/78 | HR 78 | Temp 98.2°F | Resp 14 | Ht 64.0 in | Wt 219.0 lb

## 2013-12-22 DIAGNOSIS — M1710 Unilateral primary osteoarthritis, unspecified knee: Secondary | ICD-10-CM

## 2013-12-22 DIAGNOSIS — M25469 Effusion, unspecified knee: Secondary | ICD-10-CM

## 2013-12-22 DIAGNOSIS — M25462 Effusion, left knee: Secondary | ICD-10-CM

## 2013-12-22 DIAGNOSIS — S83207A Unspecified tear of unspecified meniscus, current injury, left knee, initial encounter: Secondary | ICD-10-CM

## 2013-12-22 DIAGNOSIS — M179 Osteoarthritis of knee, unspecified: Secondary | ICD-10-CM

## 2013-12-22 DIAGNOSIS — IMO0002 Reserved for concepts with insufficient information to code with codable children: Secondary | ICD-10-CM

## 2013-12-22 MED ORDER — OXYCODONE-ACETAMINOPHEN 10-325 MG PO TABS: 1.0000 | ORAL_TABLET | Freq: Three times a day (TID) | ORAL | Status: DC | PRN

## 2013-12-22 MED ORDER — MELOXICAM 7.5 MG PO TABS: 7.5000 mg | ORAL_TABLET | Freq: Every day | ORAL | Status: DC

## 2013-12-22 NOTE — Patient Instructions (Signed)
Referral to orthopedics  Pain medication increased  Stop the ibuprofen Take meloxicam once a day  F/U as previous

## 2013-12-23 NOTE — Assessment & Plan Note (Addendum)
Refer to orthopedics for treatment , likley surgery Meloxicam given, d/c ibuprofen Percocet given

## 2013-12-23 NOTE — Progress Notes (Signed)
Patient ID: Robin Sherman, female   DOB: [birth year], 53 y.o.   MRN: [id number]   Subjective:    Patient ID: Robin Sherman, female    DOB: [birth year], 53 y.o.   MRN: [id number]  Patient presents for L leg pain  patient here with worsening left knee and leg pain. She had an MRI done in April 2015 which showed complete radial tear of the route of the posterior horn of her medial meniscus she also had arthritis and some mild subluxation. She had seen orthopedics before the MRI however she wants to hold off on returning she thought she can manage her pain with pain medicine and a knee brace. She was to proceed with orthopedics today. She continues swelling as well as her knee giving out on her    Review Of Systems:  GEN- denies fatigue, fever, weight loss,weakness, recent illness HEENT- denies eye drainage, change in vision, nasal discharge, CVS- denies chest pain, palpitations RESP- denies SOB, cough, wheeze MSK- + joint pain, muscle aches, injury Neuro- denies headache, dizziness, syncope, seizure activity       Objective:    BP 138/78  Pulse 78  Temp(Src) 98.2 F (36.8 C) (Oral)  Resp 14  Ht 5\' 4"  (1.626 m)  Wt 219 lb (99.338 kg)  BMI 37.57 kg/m2  LMP 04/15/2013 GEN- NAD, alert and oriented x3 MSK- Left knee- mild effusion, decreased ROM, TTP Inferior and lateral knee, antalgic gait        Assessment & Plan:      Problem List Items Addressed This Visit   None      Note: This dictation was prepared with Dragon dictation along with smaller phrase technology. Any transcriptional errors that result from this process are unintentional.

## 2013-12-24 ENCOUNTER — Encounter: Payer: Self-pay | Admitting: *Deleted

## 2013-12-24 NOTE — Telephone Encounter (Signed)
This encounter was created in error - please disregard.

## 2013-12-29 ENCOUNTER — Telehealth: Payer: Self-pay | Admitting: *Deleted

## 2013-12-29 MED ORDER — OXYCODONE-ACETAMINOPHEN 10-325 MG PO TABS: 1.0000 | ORAL_TABLET | Freq: Three times a day (TID) | ORAL | Status: DC | PRN

## 2013-12-29 NOTE — Telephone Encounter (Signed)
FYI

## 2013-12-29 NOTE — Telephone Encounter (Signed)
Refilled with 45 tablets  She can take the meloxicam with foot during mid day

## 2013-12-29 NOTE — Telephone Encounter (Signed)
Pt came into office stating that her perocet was accidentally washed in the washer and only had taken 8 pills out of the bottle and wants to know if she can possibly get another refill on the percocet, also states that the meloxican that was given the same time as the percocet she can not take together d/t making her stomach hurt and also irritates her buttocks. Pt is here in the lobby for the refill. MD please advise!

## 2014-01-12 ENCOUNTER — Other Ambulatory Visit: Payer: Self-pay | Admitting: *Deleted

## 2014-01-12 ENCOUNTER — Telehealth: Payer: Self-pay | Admitting: Orthopedic Surgery

## 2014-01-12 ENCOUNTER — Encounter: Payer: Self-pay | Admitting: Orthopedic Surgery

## 2014-01-12 ENCOUNTER — Ambulatory Visit (INDEPENDENT_AMBULATORY_CARE_PROVIDER_SITE_OTHER): Payer: BC Managed Care – PPO | Admitting: Orthopedic Surgery

## 2014-01-12 ENCOUNTER — Encounter (HOSPITAL_COMMUNITY): Payer: Self-pay | Admitting: Pharmacy Technician

## 2014-01-12 ENCOUNTER — Other Ambulatory Visit: Payer: BC Managed Care – PPO

## 2014-01-12 VITALS — BP 141/91 | Ht 64.0 in | Wt 224.0 lb

## 2014-01-12 DIAGNOSIS — M23322 Other meniscus derangements, posterior horn of medial meniscus, left knee: Secondary | ICD-10-CM

## 2014-01-12 DIAGNOSIS — Z0000 Encounter for general adult medical examination without abnormal findings: Secondary | ICD-10-CM

## 2014-01-12 DIAGNOSIS — M23329 Other meniscus derangements, posterior horn of medial meniscus, unspecified knee: Secondary | ICD-10-CM

## 2014-01-12 DIAGNOSIS — M1712 Unilateral primary osteoarthritis, left knee: Secondary | ICD-10-CM

## 2014-01-12 DIAGNOSIS — I10 Essential (primary) hypertension: Secondary | ICD-10-CM

## 2014-01-12 DIAGNOSIS — M1710 Unilateral primary osteoarthritis, unspecified knee: Secondary | ICD-10-CM

## 2014-01-12 LAB — CBC WITH DIFFERENTIAL/PLATELET
BASOS ABS: 0 10*3/uL (ref 0.0–0.1)
Basophils Relative: 0 % (ref 0–1)
EOS PCT: 3 % (ref 0–5)
Eosinophils Absolute: 0.3 10*3/uL (ref 0.0–0.7)
HEMATOCRIT: 38.3 % (ref 36.0–46.0)
HEMOGLOBIN: 13.2 g/dL (ref 12.0–15.0)
LYMPHS ABS: 3.5 10*3/uL (ref 0.7–4.0)
LYMPHS PCT: 36 % (ref 12–46)
MCH: 28.3 pg (ref 26.0–34.0)
MCHC: 34.5 g/dL (ref 30.0–36.0)
MCV: 82.2 fL (ref 78.0–100.0)
MONO ABS: 0.6 10*3/uL (ref 0.1–1.0)
MONOS PCT: 6 % (ref 3–12)
Neutro Abs: 5.3 10*3/uL (ref 1.7–7.7)
Neutrophils Relative %: 55 % (ref 43–77)
Platelets: 384 10*3/uL (ref 150–400)
RBC: 4.66 MIL/uL (ref 3.87–5.11)
RDW: 14.7 % (ref 11.5–15.5)
WBC: 9.6 10*3/uL (ref 4.0–10.5)

## 2014-01-12 LAB — LIPID PANEL
CHOL/HDL RATIO: 3.7 ratio
Cholesterol: 168 mg/dL (ref 0–200)
HDL: 45 mg/dL (ref >39)
LDL CALC: 104 mg/dL — AB (ref 0–99)
TRIGLYCERIDES: 93 mg/dL (ref <150)
VLDL: 19 mg/dL (ref 0–40)

## 2014-01-12 LAB — COMPLETE METABOLIC PANEL WITH GFR
ALK PHOS: 106 U/L (ref 39–117)
ALT: 24 U/L (ref 0–35)
AST: 17 U/L (ref 0–37)
Albumin: 4.2 g/dL (ref 3.5–5.2)
BUN: 16 mg/dL (ref 6–23)
CO2: 26 mEq/L (ref 19–32)
Calcium: 9.5 mg/dL (ref 8.4–10.5)
Chloride: 101 mEq/L (ref 96–112)
Creat: 0.8 mg/dL (ref 0.50–1.10)
GFR, Est African American: >89 mL/min
GFR, Est Non African American: 85 mL/min
Glucose, Bld: 112 mg/dL — ABNORMAL HIGH (ref 70–99)
Potassium: 4.4 mEq/L (ref 3.5–5.3)
SODIUM: 139 meq/L (ref 135–145)
TOTAL PROTEIN: 7.2 g/dL (ref 6.0–8.3)
Total Bilirubin: 0.3 mg/dL (ref 0.2–1.2)

## 2014-01-12 NOTE — Patient Instructions (Signed)
Robin Sherman  01/12/2014   Your procedure is scheduled on:  01/14/14  Report to [facility] at 11:30 AM.  Call this number if you have problems the morning of surgery: [phone]   Remember:   Do not eat food or drink liquids after midnight.   Take these medicines the morning of surgery with A SIP OF WATER: Amlodipine, HCTZ and Meloxicam. You may take your Oxycodone if needed. Use your Albuterol inhaler before coming.    Do not wear jewelry, make-up or nail polish.  Do not wear lotions, powders, or perfumes. You may wear deodorant.  Do not shave 48 hours prior to surgery. Men may shave face and neck.  Do not bring valuables to the hospital.  [facility] is not responsible for any belongings or valuables.               Contacts, dentures or bridgework may not be worn into surgery.  Leave suitcase in the car. After surgery it may be brought to your room.  For patients admitted to the hospital, discharge time is determined by your treatment team.               Patients discharged the day of surgery will not be allowed to drive home.   Special Instructions: Shower using Hibiclens the night before surgery and the morning of surgery.   Please read over the following fact sheets that you were given: Anesthesia Post-op Instructions and Care and Recovery After Surgery    Arthroscopic Procedure, Knee An arthroscopic procedure can find what is wrong with your knee. PROCEDURE Arthroscopy is a surgical technique that allows your orthopedic surgeon to diagnose and treat your knee injury with accuracy. They will look into your knee through a small instrument. This is almost like a small (pencil sized) telescope. Because arthroscopy affects your knee less than open knee surgery, you can anticipate a more rapid recovery. Taking an active role by following your caregiver's instructions will help with rapid and complete recovery. Use crutches, rest, elevation, ice, and knee exercises as instructed. The  length of recovery depends on various factors including type of injury, age, physical condition, medical conditions, and your rehabilitation. Your knee is the joint between the large bones (femur and tibia) in your leg. Cartilage covers these bone ends which are smooth and slippery and allow your knee to bend and move smoothly. Two menisci, thick, semi-lunar shaped pads of cartilage which form a rim inside the joint, help absorb shock and stabilize your knee. Ligaments bind the bones together and support your knee joint. Muscles move the joint, help support your knee, and take stress off the joint itself. Because of this all programs and physical therapy to rehabilitate an injured or repaired knee require rebuilding and strengthening your muscles. AFTER THE PROCEDURE  After the procedure, you will be moved to a recovery area until most of the effects of the medication have worn off. Your caregiver will discuss the test results with you.  Only take over-the-counter or prescription medicines for pain, discomfort, or fever as directed by your caregiver. SEEK MEDICAL CARE IF:   You have increased bleeding from your wounds.  You see redness, swelling, or have increasing pain in your wounds.  You have pus coming from your wound.  You have an oral temperature above 102 F (38.9 C).  You notice a bad smell coming from the wound or dressing.  You have severe pain with any motion of your knee. [place]  IF:   You develop a rash.  You have difficulty breathing.  You have any allergic problems. Document Released: 06/30/2000 Document Revised: 09/25/2011 Document Reviewed: 01/22/2008 [facility] Patient Information 2015 [place], [place]. This information is not intended to replace advice given to you by your health care provider. Make sure you discuss any questions you have with your health care provider.    Arthroscopic Procedure, Knee, Care After Refer to this sheet in the next  few weeks. These discharge instructions provide you with general information on caring for yourself after you leave the hospital. Your health care provider may also give you specific instructions. Your treatment has been planned according to the most current medical practices available, but unavoidable complications sometimes occur. If you have any problems or questions after discharge, please call your health care provider. HOME CARE INSTRUCTIONS   It is normal to be sore for a couple days after surgery. See your health care provider if this seems to be getting worse rather than better.  Only take over-the-counter or prescription medicines for pain, discomfort, or fever as directed by your health care provider.  Take showers rather than baths, or as directed by your health care provider.  Change bandages (dressings) if necessary or as directed.  You may resume normal diet and activities as directed or allowed.  Avoid lifting and driving until you are directed otherwise.  Make an appointment to see your health care provider for stitches (suture) or staple removal as directed.  You may put ice on the area.  Put the ice in a plastic bag. Place a towel between your skin and the bag.  Leave the ice on for 15-20 minutes, three to four times per day for the first 2 days.  Elevate the knee above the level of your heart to reduce swelling, and avoid dangling the leg.  Do 10-15 ankle pumps (pointing your toes toward you and then away from you) two to three times daily.  If you are given compression stockings to wear after surgery, use them for as long as your surgeon tells you (around 10-14 days).  Avoid smoking and exposure to secondhand smoke. SEEK MEDICAL CARE IF:   You have increased bleeding from your wounds.  You see redness or swelling or you have increasing pain in your wounds.  You have pus coming from your wound.  You have a fever or persistent symptoms for more than 2-3  days.  You notice a bad smell coming from the wound or dressing.  You have severe pain with any motion of your knee. SEEK IMMEDIATE MEDICAL CARE IF:   You develop a rash.  You have difficulty breathing.  You develop any reaction or side effects to medicines taken.  You develop pain in the calves or back of the knee.  You develop chest pain, shortness of breath, or difficulty breathing.  You develop numbness or tingling in the leg or foot. MAKE SURE YOU:   Understand these instructions.  Will watch your condition.  Will get help right away if you are not doing well or you get worse. Document Released: 01/20/2005 Document Revised: 07/08/2013 Document Reviewed: 11/28/2012 [facility] Patient Information 2015 [place], [place]. This information is not intended to replace advice given to you by your health care provider. Make sure you discuss any questions you have with your health care provider.    PATIENT INSTRUCTIONS POST-ANESTHESIA  IMMEDIATELY FOLLOWING SURGERY:  Do not drive or operate machinery for the first twenty four hours after surgery.  Do not make any important decisions for twenty four hours after surgery or while taking narcotic pain medications or sedatives.  If you develop intractable nausea and vomiting or a severe headache please notify your doctor immediately.  FOLLOW-UP:  Please make an appointment with your surgeon as instructed. You do not need to follow up with anesthesia unless specifically instructed to do so.  WOUND CARE INSTRUCTIONS (if applicable):  Keep a dry clean dressing on the anesthesia/puncture wound site if there is drainage.  Once the wound has quit draining you may leave it open to air.  Generally you should leave the bandage intact for twenty four hours unless there is drainage.  If the epidural site drains for more than 36-48 hours please call the anesthesia department.  QUESTIONS?:  Please feel free to call your physician or the hospital operator  if you have any questions, and they will be happy to assist you.

## 2014-01-12 NOTE — Progress Notes (Signed)
Subjective:    Patient ID: Robin Sherman, female    DOB: [birth year], 53 y.o.   MRN: [id number]  Chief complaint is persistent pain left knee. Consult requested. Chief Complaint  Patient presents with  . Follow-up    Left knee pain persisting, Consult from Dr. Buelah Manis   BP 141/91  Ht 5\' 4"  (1.626 m)  Wt 224 lb (101.606 kg)  BMI 38.43 kg/m2  LMP 04/15/2013  HPI  53 year old female last seen here for left knee pain after standing for long period of time and then developed medial knee pain with has since progressed to include catching locking giving way of the left knee despite the cortisone injection in the brace that she was prescribed. Her pain has become unbearable. She's tried some Percocet with good pain relief but still has mechanical symptoms and comes in for reevaluation. She did have an MRI which shows a torn medial meniscus and osteoarthritis the medial compartment of the left knee aircraft she says she's ready for surgery   Review of Systems  Musculoskeletal: Positive for gait problem and joint swelling.  All other systems reviewed and are negative.  Past Medical History  Diagnosis Date  . Hypertension   . Asthma   . Eczema     Followed by Dr. Nevada Crane dermatology   Past Surgical History  Procedure Laterality Date  . Tubal ligation      [facility]  . Colonoscopy N/A 01/27/2013    Procedure: COLONOSCOPY;  Surgeon: Danie Binder, MD;  Location: [facility];  Service: Endoscopy;  Laterality: N/A;  8:30 AM  . Supracervical abdominal hysterectomy N/A 08/01/2013    Procedure: HYSTERECTOMY SUPRACERVICAL ABDOMINAL;  Surgeon: Jonnie Kind, MD;  Location: [facility] OR[facility];  Service: Gynecology;  Laterality: N/A;  . Bilateral salpingectomy Bilateral 08/01/2013    Procedure: BILATERAL SALPINGECTOMY;  Surgeon: Jonnie Kind, MD;  Location: [facility] OR[facility];  Service: Gynecology;  Laterality: Bilateral;  . Hematoma evacuation N/A 08/03/2013    Procedure: EVACUATION PELVIC HEMATOMA;  Surgeon:  Jonnie Kind, MD;  Location: [facility] OR[facility];  Service: Gynecology;  Laterality: N/A;  . Abdominal hysterectomy     History  Substance Use Topics  . Smoking status: Current Every Day Smoker -- 0.10 packs/day for 20 years    Types: Cigarettes  . Smokeless tobacco: Never Used  . Alcohol Use: Yes     Comment: Rare   Family History  Problem Relation Age of Onset  . Heart disease Mother     MI  . Stroke Father   . Heart disease Father     MI  . Cancer Sister     breast  . Colon cancer Neg Hx         Objective:   Physical Exam  VS BP 141/91  Ht 5\' 4"  (1.626 m)  Wt 224 lb (101.606 kg)  BMI 38.43 kg/m2  LMP 04/15/2013  General and hygiene are nwithout assistive deviceormal. Development and nutrition are normal. Body habitus large Mood Affect are normal The patient is alert and oriented x3 Ambulatory status normal; no assistive devices  RUE (include skin) Inspection reveals no abnormalities. Joint range of motion is within normal limits without contracture. There are no subluxations. Muscle tone is normal. Skin is warm dry and intact without rash lesion or ulceration.  LUE Inspection reveals no abnormalities. Joint range of motion is within normal limits without contracture. There are no subluxations. Muscle tone is normal. Skin is warm dry and intact without rash  lesion or ulceration.  RLE Inspection reveals no abnormalities. Joint range of motion is within normal limits without contracture. There are no subluxations. Muscle tone is normal. Skin is warm dry and intact without rash lesion or ulceration.  LLE Inspection reveals medial joint line tenderness with positive McMurray sign. Joint range of motion is within normal limits without contracture. There are no subluxations. Muscle tone is normal. Skin is warm dry and intact without rash lesion or ulceration.  CDV peripheral pulses are intact without swelling or varicose veins  LYMPH are normal in all 4 extremities with no  palpable nodes  DTR are equal and symmetric Balance  is normal  X-ray shows mild degenerative arthritis medial compartment with MRI showing torn medial meniscus and degenerative changes medial compartment       Assessment & Plan:  Diagnosis torn medial meniscus left knee Diagnosis osteoarthritis left knee  Plan arthroscopy left knee

## 2014-01-12 NOTE — Patient Instructions (Addendum)
Arthroscopic Procedure, Knee An arthroscopic procedure can find what is wrong with your knee. PROCEDURE Arthroscopy is a surgical technique that allows your orthopedic surgeon to diagnose and treat your knee injury with accuracy. They will look into your knee through a small instrument. This is almost like a small (pencil sized) telescope. Because arthroscopy affects your knee less than open knee surgery, you can anticipate a more rapid recovery. Taking an active role by following your caregiver's instructions will help with rapid and complete recovery. Use crutches, rest, elevation, ice, and knee exercises as instructed. The length of recovery depends on various factors including type of injury, age, physical condition, medical conditions, and your rehabilitation. Your knee is the joint between the large bones (femur and tibia) in your leg. Cartilage covers these bone ends which are smooth and slippery and allow your knee to bend and move smoothly. Two menisci, thick, semi-lunar shaped pads of cartilage which form a rim inside the joint, help absorb shock and stabilize your knee. Ligaments bind the bones together and support your knee joint. Muscles move the joint, help support your knee, and take stress off the joint itself. Because of this all programs and physical therapy to rehabilitate an injured or repaired knee require rebuilding and strengthening your muscles. AFTER THE PROCEDURE  After the procedure, you will be moved to a recovery area until most of the effects of the medication have worn off. Your caregiver will discuss the test results with you.   Only take over-the-counter or prescription medicines for pain, discomfort, or fever as directed by your caregiver.    You have been scheduled for arthroscocpic knee surgery.  All surgeries carry some risk.  Remember you always have the option of continued nonsurgical treatment. However in this situation the risks vs. the benefits favor surgery as  the best treatment option. The risks of the surgery includes the following but is not limited to bleeding, infection, pulmonary embolus, death from anesthesia, nerve injury vascular injury or need for further surgery, continued pain.  Specific to this procedure the following risks and complications are rare but possible Stiffness, pain, weakness, giving out  I expect  recovery will be in 3-4 weeks some patients take 6 weeks.  You  will need physical therapy after the procedure  Stop any blood thinning medication: such as warfarin, coumadin, naprosyn, ibuprofen, advil, diclofenac, aspirin    OOW X 4 WEEKS

## 2014-01-12 NOTE — Telephone Encounter (Signed)
Regarding out-patient surgery scheduled as of today's office visit (01/12/14) for [facility], 01/14/14, for CPT [phone], left knee arthroscopy, contacted insurer, [employer] [place], ph#'s [phone], as well as 2 other phone #'s; reached automated voice response system, which is not accepting this date of birth with this member/contract#.  Further information to follow.

## 2014-01-12 NOTE — H&P (Signed)
Subjective:     Patient ID: Robin Sherman, female    DOB: 1960-11-30, 53 y.o.   MRN: [id number]  Chief complaint is persistent pain left knee. Consult requested. Chief Complaint   Patient presents with   .  Follow-up       Left knee pain persisting, Consult from Dr. Buelah Manis    BP 141/91  Ht 5\' 4"  (1.626 m)  Wt 224 lb (101.606 kg)  BMI 38.43 kg/m2  LMP 04/15/2013  HPI  53 year old female last seen here for left knee pain after standing for long period of time and then developed medial knee pain with has since progressed to include catching locking giving way of the left knee despite the cortisone injection in the brace that she was prescribed. Her pain has become unbearable. She's tried some Percocet with good pain relief but still has mechanical symptoms and comes in for reevaluation. She did have an MRI which shows a torn medial meniscus and osteoarthritis the medial compartment of the left knee aircraft she says she's ready for surgery   Review of Systems  Musculoskeletal: Positive for gait problem and joint swelling.  All other systems reviewed and are negative.    Past Medical History   Diagnosis  Date   .  Hypertension     .  Asthma     .  Eczema         Followed by Dr. Nevada Crane dermatology      Past Surgical History   Procedure  Laterality  Date   .  Tubal ligation           [facility]   .  Colonoscopy  N/A  01/27/2013       Procedure: COLONOSCOPY;  Surgeon: Danie Binder, MD;  [facility];  Service: Endoscopy;  Laterality: N/A;  8:30 AM   .  Supracervical abdominal hysterectomy  N/A  08/01/2013       Procedure: HYSTERECTOMY SUPRACERVICAL ABDOMINAL;  Surgeon: Jonnie Kind, MD;  Location: [facility];  Service: Gynecology;  Laterality: N/A;   .  Bilateral salpingectomy  Bilateral  08/01/2013       Procedure: BILATERAL SALPINGECTOMY;  Surgeon: Jonnie Kind, MD;  Location: [facility];  Service: Gynecology;  Laterality: Bilateral;   .  Hematoma evacuation  N/A   08/03/2013       Procedure: EVACUATION PELVIC HEMATOMA;  Surgeon: Jonnie Kind, MD;  Location: [facility];  Service: Gynecology;  Laterality: N/A;   .  Abdominal hysterectomy          History   Substance Use Topics   .  Smoking status:  Current Every Day Smoker -- 0.10 packs/day for 20 years       Types:  Cigarettes   .  Smokeless tobacco:  Never Used   .  Alcohol Use:  Yes         Comment: Rare      Family History   Problem  Relation  Age of Onset   .  Heart disease  Mother         MI   .  Stroke  Father     .  Heart disease  Father         MI   .  Cancer  Sister         breast   .  Colon cancer  Neg Hx            Objective:  Physical Exam  VS BP 141/91  Ht 5\' 4"  (1.626 m)  Wt 224 lb (101.606 kg)  BMI 38.43 kg/m2  LMP 04/15/2013  General and hygiene are nwithout assistive deviceormal. Development and nutrition are normal. Body habitus large Mood Affect are normal The patient is alert and oriented x3 Ambulatory status normal; no assistive devices  RUE (include skin) Inspection reveals no abnormalities. Joint range of motion is within normal limits without contracture. There are no subluxations. Muscle tone is normal. Skin is warm dry and intact without rash lesion or ulceration.  LUE Inspection reveals no abnormalities. Joint range of motion is within normal limits without contracture. There are no subluxations. Muscle tone is normal. Skin is warm dry and intact without rash lesion or ulceration.  RLE Inspection reveals no abnormalities. Joint range of motion is within normal limits without contracture. There are no subluxations. Muscle tone is normal. Skin is warm dry and intact without rash lesion or ulceration.  LLE Inspection reveals medial joint line tenderness with positive McMurray sign. Joint range of motion is within normal limits without contracture. There are no subluxations. Muscle tone is normal. Skin is warm dry and intact without rash lesion or  ulceration.  CDV peripheral pulses are intact without swelling or varicose veins  LYMPH are normal in all 4 extremities with no palpable nodes  DTR are equal and symmetric Balance  is normal  X-ray shows mild degenerative arthritis medial compartment with MRI showing torn medial meniscus and degenerative changes medial compartment        Assessment & Plan:   Diagnosis torn medial meniscus left knee Diagnosis osteoarthritis left knee  Plan arthroscopy left knee

## 2014-01-13 ENCOUNTER — Ambulatory Visit: Payer: BC Managed Care – PPO | Admitting: Orthopedic Surgery

## 2014-01-13 ENCOUNTER — Encounter (HOSPITAL_COMMUNITY): Payer: Self-pay

## 2014-01-13 ENCOUNTER — Encounter (HOSPITAL_COMMUNITY)
Admission: RE | Admit: 2014-01-13 | Discharge: 2014-01-13 | Disposition: A | Discharge status: Home / Self Care | Payer: BC Managed Care – PPO | Source: Ambulatory Visit | Attending: Obstetrics and Gynecology | Admitting: Obstetrics and Gynecology

## 2014-01-14 ENCOUNTER — Encounter (HOSPITAL_COMMUNITY): Payer: BC Managed Care – PPO | Admitting: Anesthesiology

## 2014-01-14 ENCOUNTER — Encounter (HOSPITAL_COMMUNITY): Payer: Self-pay | Admitting: *Deleted

## 2014-01-14 ENCOUNTER — Ambulatory Visit (HOSPITAL_COMMUNITY)
Admission: RE | Admit: 2014-01-14 | Discharge: 2014-01-14 | Disposition: A | Discharge status: Home / Self Care | Payer: BC Managed Care – PPO | Source: Ambulatory Visit | Attending: Orthopedic Surgery | Admitting: Orthopedic Surgery

## 2014-01-14 ENCOUNTER — Ambulatory Visit (HOSPITAL_COMMUNITY): Payer: BC Managed Care – PPO | Admitting: Anesthesiology

## 2014-01-14 ENCOUNTER — Encounter (HOSPITAL_COMMUNITY)
Admission: RE | Disposition: A | Discharge status: Home / Self Care | Payer: Self-pay | Source: Ambulatory Visit | Attending: Orthopedic Surgery

## 2014-01-14 DIAGNOSIS — M1990 Unspecified osteoarthritis, unspecified site: Secondary | ICD-10-CM

## 2014-01-14 DIAGNOSIS — M2240 Chondromalacia patellae, unspecified knee: Secondary | ICD-10-CM | POA: Insufficient documentation

## 2014-01-14 DIAGNOSIS — Z5189 Encounter for other specified aftercare: Secondary | ICD-10-CM

## 2014-01-14 DIAGNOSIS — IMO0002 Reserved for concepts with insufficient information to code with codable children: Secondary | ICD-10-CM | POA: Insufficient documentation

## 2014-01-14 DIAGNOSIS — Z79899 Other long term (current) drug therapy: Secondary | ICD-10-CM | POA: Insufficient documentation

## 2014-01-14 DIAGNOSIS — S83207D Unspecified tear of unspecified meniscus, current injury, left knee, subsequent encounter: Secondary | ICD-10-CM

## 2014-01-14 DIAGNOSIS — M1710 Unilateral primary osteoarthritis, unspecified knee: Secondary | ICD-10-CM | POA: Insufficient documentation

## 2014-01-14 DIAGNOSIS — X58XXXA Exposure to other specified factors, initial encounter: Secondary | ICD-10-CM | POA: Insufficient documentation

## 2014-01-14 DIAGNOSIS — F17200 Nicotine dependence, unspecified, uncomplicated: Secondary | ICD-10-CM | POA: Insufficient documentation

## 2014-01-14 DIAGNOSIS — I10 Essential (primary) hypertension: Secondary | ICD-10-CM | POA: Insufficient documentation

## 2014-01-14 HISTORY — PX: KNEE ARTHROSCOPY WITH MEDIAL MENISECTOMY: SHX5651

## 2014-01-14 HISTORY — PX: CHONDROPLASTY: SHX5177

## 2014-01-14 SURGERY — ARTHROSCOPY, KNEE, WITH MEDIAL MENISCECTOMY
Anesthesia: General | Site: Knee | Laterality: Left

## 2014-01-14 MED ORDER — LIDOCAINE HCL (PF) 1 % IJ SOLN
INTRAMUSCULAR | Status: AC
  Filled 2014-01-14: qty 5

## 2014-01-14 MED ORDER — OXYCODONE-ACETAMINOPHEN 10-325 MG PO TABS: 1.0000 | ORAL_TABLET | Freq: Three times a day (TID) | ORAL | Status: DC | PRN

## 2014-01-14 MED ORDER — FENTANYL CITRATE 0.05 MG/ML IJ SOLN: 25.0000 ug | INTRAMUSCULAR | Status: DC | PRN

## 2014-01-14 MED ORDER — FENTANYL CITRATE 0.05 MG/ML IJ SOLN
INTRAMUSCULAR | Status: AC
  Filled 2014-01-14: qty 2

## 2014-01-14 MED ORDER — CEFAZOLIN SODIUM-DEXTROSE 2-3 GM-% IV SOLR
INTRAVENOUS | Status: AC
  Filled 2014-01-14: qty 50

## 2014-01-14 MED ORDER — ONDANSETRON HCL 4 MG/2ML IJ SOLN
4.0000 mg | Freq: Once | INTRAMUSCULAR | Status: AC
  Administered 2014-01-14: 4 mg via INTRAVENOUS

## 2014-01-14 MED ORDER — GLYCOPYRROLATE 0.2 MG/ML IJ SOLN
0.2000 mg | Freq: Once | INTRAMUSCULAR | Status: AC
  Administered 2014-01-14: 0.2 mg via INTRAVENOUS

## 2014-01-14 MED ORDER — CEFAZOLIN SODIUM-DEXTROSE 2-3 GM-% IV SOLR
2.0000 g | INTRAVENOUS | Status: AC
  Administered 2014-01-14: 2 g via INTRAVENOUS

## 2014-01-14 MED ORDER — SODIUM CHLORIDE 0.9 % IR SOLN
Status: DC | PRN
  Administered 2014-01-14: 1000 mL

## 2014-01-14 MED ORDER — SUCCINYLCHOLINE CHLORIDE 20 MG/ML IJ SOLN
INTRAMUSCULAR | Status: DC | PRN
  Administered 2014-01-14: 100 mg via INTRAVENOUS

## 2014-01-14 MED ORDER — EPINEPHRINE HCL 1 MG/ML IJ SOLN
INTRAMUSCULAR | Status: AC
  Filled 2014-01-14: qty 3

## 2014-01-14 MED ORDER — LACTATED RINGERS IV SOLN
INTRAVENOUS | Status: DC | PRN
  Administered 2014-01-14 (×2): via INTRAVENOUS

## 2014-01-14 MED ORDER — ONDANSETRON HCL 4 MG/2ML IJ SOLN: 4.0000 mg | Freq: Once | INTRAMUSCULAR | Status: AC | PRN

## 2014-01-14 MED ORDER — ONDANSETRON HCL 4 MG/2ML IJ SOLN
INTRAMUSCULAR | Status: AC
  Filled 2014-01-14: qty 2

## 2014-01-14 MED ORDER — PROPOFOL 10 MG/ML IV BOLUS
INTRAVENOUS | Status: DC | PRN
  Administered 2014-01-14: 150 mg via INTRAVENOUS

## 2014-01-14 MED ORDER — PROPOFOL 10 MG/ML IV BOLUS
INTRAVENOUS | Status: AC
  Filled 2014-01-14: qty 20

## 2014-01-14 MED ORDER — SODIUM CHLORIDE 0.9 % IR SOLN
Status: DC | PRN
  Administered 2014-01-14 (×3)

## 2014-01-14 MED ORDER — BUPIVACAINE-EPINEPHRINE (PF) 0.5% -1:200000 IJ SOLN
INTRAMUSCULAR | Status: AC
  Filled 2014-01-14: qty 60

## 2014-01-14 MED ORDER — MIDAZOLAM HCL 2 MG/2ML IJ SOLN
1.0000 mg | INTRAMUSCULAR | Status: DC | PRN
  Administered 2014-01-14: 2 mg via INTRAVENOUS

## 2014-01-14 MED ORDER — FENTANYL CITRATE 0.05 MG/ML IJ SOLN
INTRAMUSCULAR | Status: DC | PRN
  Administered 2014-01-14 (×7): 50 ug via INTRAVENOUS

## 2014-01-14 MED ORDER — LIDOCAINE HCL (CARDIAC) 20 MG/ML IV SOLN
INTRAVENOUS | Status: DC | PRN
  Administered 2014-01-14: 50 mg via INTRAVENOUS

## 2014-01-14 MED ORDER — ALBUTEROL SULFATE HFA 108 (90 BASE) MCG/ACT IN AERS
INHALATION_SPRAY | RESPIRATORY_TRACT | Status: AC
  Filled 2014-01-14: qty 6.7

## 2014-01-14 MED ORDER — BUPIVACAINE-EPINEPHRINE (PF) 0.5% -1:200000 IJ SOLN
INTRAMUSCULAR | Status: DC | PRN
  Administered 2014-01-14: 60 mL

## 2014-01-14 MED ORDER — CHLORHEXIDINE GLUCONATE 4 % EX LIQD
60.0000 mL | Freq: Once | CUTANEOUS | Status: DC
Start: 2014-01-14 — End: 2014-01-16

## 2014-01-14 MED ORDER — GLYCOPYRROLATE 0.2 MG/ML IJ SOLN
INTRAMUSCULAR | Status: AC
  Filled 2014-01-14: qty 1

## 2014-01-14 MED ORDER — MIDAZOLAM HCL 2 MG/2ML IJ SOLN
INTRAMUSCULAR | Status: AC
  Filled 2014-01-14: qty 2

## 2014-01-14 MED ORDER — LACTATED RINGERS IV SOLN
INTRAVENOUS | Status: DC
  Administered 2014-01-14: 12:00:00 via INTRAVENOUS

## 2014-01-14 MED ORDER — OXYCODONE-ACETAMINOPHEN 5-325 MG PO TABS: 1.0000 | ORAL_TABLET | Freq: Once | ORAL | Status: DC

## 2014-01-14 MED ORDER — FENTANYL CITRATE 0.05 MG/ML IJ SOLN
INTRAMUSCULAR | Status: AC
  Filled 2014-01-14: qty 5

## 2014-01-14 MED ORDER — ALBUTEROL SULFATE HFA 108 (90 BASE) MCG/ACT IN AERS
INHALATION_SPRAY | RESPIRATORY_TRACT | Status: DC | PRN
  Administered 2014-01-14: 5 via RESPIRATORY_TRACT
  Administered 2014-01-14: 4 via RESPIRATORY_TRACT

## 2014-01-14 MED ORDER — FENTANYL CITRATE 0.05 MG/ML IJ SOLN
25.0000 ug | INTRAMUSCULAR | Status: AC
  Administered 2014-01-14 (×2): 25 ug via INTRAVENOUS

## 2014-01-14 SURGICAL SUPPLY — 56 items
ARTHROWAND PARAGON T2 (SURGICAL WAND)
BAG HAMPER (MISCELLANEOUS) ×3 IMPLANT
BANDAGE ELASTIC 6 VELCRO NS (GAUZE/BANDAGES/DRESSINGS) ×3 IMPLANT
BLADE 11 SAFETY STRL DISP (BLADE) ×3 IMPLANT
BLADE AGGRESSIVE PLUS 4.0 (BLADE) ×3 IMPLANT
CHLORAPREP W/TINT 26ML (MISCELLANEOUS) ×6 IMPLANT
CLOTH BEACON ORANGE TIMEOUT ST (SAFETY) ×3 IMPLANT
COOLER CRYO IC GRAV AND TUBE (ORTHOPEDIC SUPPLIES) ×3 IMPLANT
CUFF CRYO KNEE LG 20X31 COOLER (ORTHOPEDIC SUPPLIES) ×2 IMPLANT
CUFF CRYO KNEE18X23 MED (MISCELLANEOUS) IMPLANT
CUFF TOURNIQUET SINGLE 34IN LL (TOURNIQUET CUFF) ×2 IMPLANT
CUFF TOURNIQUET SINGLE 44IN (TOURNIQUET CUFF) IMPLANT
CUTTER ANGLED DBL BITE 4.5 (BURR) IMPLANT
DECANTER SPIKE VIAL GLASS SM (MISCELLANEOUS) ×6 IMPLANT
DRSG XEROFORM 1X8 (GAUZE/BANDAGES/DRESSINGS) ×2 IMPLANT
GAUZE SPONGE 4X4 12PLY STRL (GAUZE/BANDAGES/DRESSINGS) ×3 IMPLANT
GAUZE SPONGE 4X4 16PLY XRAY LF (GAUZE/BANDAGES/DRESSINGS) ×3 IMPLANT
GAUZE XEROFORM 5X9 LF (GAUZE/BANDAGES/DRESSINGS) ×3 IMPLANT
GLOVE BIO SURGEON STRL SZ7 (GLOVE) ×6 IMPLANT
GLOVE BIOGEL PI IND STRL 7.0 (GLOVE) IMPLANT
GLOVE BIOGEL PI INDICATOR 7.0 (GLOVE) ×6
GLOVE SKINSENSE NS SZ8.0 LF (GLOVE) ×2
GLOVE SKINSENSE STRL SZ8.0 LF (GLOVE) ×1 IMPLANT
GLOVE SS N UNI LF 8.5 STRL (GLOVE) ×3 IMPLANT
GOWN STRL REUS W/TWL LRG LVL3 (GOWN DISPOSABLE) ×9 IMPLANT
GOWN STRL REUS W/TWL XL LVL3 (GOWN DISPOSABLE) ×3 IMPLANT
HLDR LEG FOAM (MISCELLANEOUS) ×1 IMPLANT
IV NS IRRIG 3000ML ARTHROMATIC (IV SOLUTION) ×10 IMPLANT
KIT BLADEGUARD II DBL (SET/KITS/TRAYS/PACK) ×3 IMPLANT
KIT ROOM TURNOVER AP CYSTO (KITS) ×3 IMPLANT
LEG HOLDER FOAM (MISCELLANEOUS) ×2
MANIFOLD NEPTUNE II (INSTRUMENTS) ×3 IMPLANT
MARKER SKIN DUAL TIP RULER LAB (MISCELLANEOUS) ×3 IMPLANT
NDL HYPO 18GX1.5 BLUNT FILL (NEEDLE) ×1 IMPLANT
NDL HYPO 21X1.5 SAFETY (NEEDLE) ×1 IMPLANT
NDL SPNL 18GX3.5 QUINCKE PK (NEEDLE) ×1 IMPLANT
NEEDLE HYPO 18GX1.5 BLUNT FILL (NEEDLE) ×3 IMPLANT
NEEDLE HYPO 21X1.5 SAFETY (NEEDLE) ×3 IMPLANT
NEEDLE SPNL 18GX3.5 QUINCKE PK (NEEDLE) ×3 IMPLANT
NS IRRIG 1000ML POUR BTL (IV SOLUTION) ×3 IMPLANT
PACK ARTHRO LIMB DRAPE STRL (MISCELLANEOUS) ×3 IMPLANT
PAD ABD 5X9 TENDERSORB (GAUZE/BANDAGES/DRESSINGS) ×3 IMPLANT
PAD ARMBOARD 7.5X6 YLW CONV (MISCELLANEOUS) ×3 IMPLANT
PADDING CAST COTTON 6X4 STRL (CAST SUPPLIES) ×3 IMPLANT
PADDING WEBRIL 6 STERILE (GAUZE/BANDAGES/DRESSINGS) ×2 IMPLANT
SET ARTHROSCOPY INST (INSTRUMENTS) ×3 IMPLANT
SET ARTHROSCOPY PUMP TUBE (IRRIGATION / IRRIGATOR) ×3 IMPLANT
SET BASIN LINEN APH (SET/KITS/TRAYS/PACK) ×3 IMPLANT
SPONGE GAUZE 4X4 12PLY (GAUZE/BANDAGES/DRESSINGS) ×1 IMPLANT
SUT ETHILON 3 0 FSL (SUTURE) IMPLANT
SYR 30ML LL (SYRINGE) ×3 IMPLANT
SYRINGE 10CC LL (SYRINGE) ×3 IMPLANT
WAND 50 DEG COVAC W/CORD (SURGICAL WAND) ×2 IMPLANT
WAND 90 DEG TURBOVAC W/CORD (SURGICAL WAND) IMPLANT
WAND ARTHRO PARAGON T2 (SURGICAL WAND) IMPLANT
YANKAUER SUCT BULB TIP 10FT TU (MISCELLANEOUS) ×9 IMPLANT

## 2014-01-14 NOTE — Anesthesia Procedure Notes (Addendum)
Procedure Name: LMA Insertion Date/Time: 01/14/2014 12:36 PM Performed by: Andree Elk, AMY A Pre-anesthesia Checklist: Patient identified, Timeout performed, Emergency Drugs available, Suction available and Patient being monitored Patient Re-evaluated:Patient Re-evaluated prior to inductionOxygen Delivery Method: Circle system utilized Preoxygenation: Pre-oxygenation with 100% oxygen Intubation Type: IV induction Ventilation: Mask ventilation without difficulty LMA: LMA inserted LMA Size: 4.0 Number of attempts: 1 Placement Confirmation: positive ETCO2 and breath sounds checked- equal and bilateral Tube secured with: Tape Dental Injury: Teeth and Oropharynx as per pre-operative assessment    Procedure Name: Intubation Date/Time: 01/14/2014 12:44 PM Performed by: Andree Elk, AMY A Pre-anesthesia Checklist: Patient identified, Patient being monitored, Timeout performed, Emergency Drugs available and Suction available Patient Re-evaluated:Patient Re-evaluated prior to inductionOxygen Delivery Method: Circle System Utilized Preoxygenation: Pre-oxygenation with 100% oxygen Intubation Type: IV induction Ventilation: Mask ventilation without difficulty Laryngoscope Size: 3 and Miller Grade View: Grade I Tube type: Oral Tube size: 7.0 mm Number of attempts: 1 Airway Equipment and Method: stylet Placement Confirmation: ETT inserted through vocal cords under direct vision,  positive ETCO2 and breath sounds checked- equal and bilateral Secured at: 21 cm Tube secured with: Tape Dental Injury: Teeth and Oropharynx as per pre-operative assessment

## 2014-01-14 NOTE — Brief Op Note (Signed)
01/14/2014  1:33 PM  PATIENT:  Robin Sherman  53 y.o. female  PRE-OPERATIVE DIAGNOSIS:  left medial meniscal tear  POST-OPERATIVE DIAGNOSIS:  left medial meniscal tear, osteoarthritis  PROCEDURE:  Procedure(s): KNEE ARTHROSCOPY WITH PARTIAL MEDIAL MENISECTOMY (Left) CHONDROPLASTY OF FEMUR (Left)  Operative findings the medial meniscus was torn at the root of the posterior horn. There was an oblique tear going right back to the red red zone. She also had grade 2 chondromalacia of the medial femoral condyle.  Lateral compartment normal trochlear patella compartment normal notch normal   SURGEON:  Surgeon(s) and Role:    * Carole Civil, MD - Primary  PHYSICIAN ASSISTANT:   ASSISTANTS: none   ANESTHESIA:   general Nose as this EBL:  Total I/O In: 1000 [I.V.:1000] Out: 10 [Blood:10]  BLOOD ADMINISTERED:none  DRAINS: none   LOCAL MEDICATIONS USED:  MARCAINE     SPECIMEN:  No Specimen  DISPOSITION OF SPECIMEN:  N/A  COUNTS:  YES  TOURNIQUET:    DICTATION: .Dragon Dictation  PLAN OF CARE: Discharge to home after PACU  PATIENT DISPOSITION:  PACU - hemodynamically stable.   Indications for procedure pain mechanical symptoms unresponsive to nonoperative treatment  The patient was identified in the preop holding area as Robin Sherman the left knee was confirmed as a surgical site and marked. The chart was reviewed  The patient was taken to the operating room and  was given appropriate preoperative antibiotic Ancef and general anesthesia was administered. The operative leg (left) was placed in the arthroscopic leg holder, the well leg was placed in a well leg holder  The left leg was then prepped and draped sterile The surgical site was confirmed and the timeout procedure was completed  The lateral portal was injected with Marcaine with epinephrine solution and a stab wound was made. The scope was placed in the lateral portal into the medial compartment. The   Diagnostic portion of the  arthroscopy was completed. A medial portal was established in the same fashion and a probe was placed into the joint. The diagnostic arthroscopy   was repeated using a probe to palpate intra-articular structures  A duckbill forceps was used to morcellized the meniscal tear, the fragments were removed with a motorized shaver. The meniscus was then balanced with an ArthroCare wand 50 probe.  A probe was then used to confirm a stable rim.  A chondroplasty of the medial femoral condyle was then performed to smooth the articular surface of the medial femoral condyle  The knee was irrigated meniscal fragments remaining were removed. The portals were closed with 3-0 nylon suture. The knee joint was then injected with 45 cc of Marcaine with epinephrine. A sterile dressing was applied followed by an Ace bandage and a Cryo/Cuff.  The patient was extubated and taken to the recovery room in stable condition.

## 2014-01-14 NOTE — Interval H&P Note (Signed)
History and Physical Interval Note:  01/14/2014 12:05 PM  Robin Sherman  has presented today for surgery, with the diagnosis of left medial meniscal tear  The various methods of treatment have been discussed with the patient and family. After consideration of risks, benefits and other options for treatment, the patient has consented to  Procedure(s): KNEE ARTHROSCOPY WITH MEDIAL MENISECTOMY (Left) as a surgical intervention .  The patient's history has been reviewed, patient examined, no change in status, stable for surgery.  I have reviewed the patient's chart and labs.  Questions were answered to the patient's satisfaction.     Arther Abbott

## 2014-01-14 NOTE — Telephone Encounter (Signed)
(  Cont'd)note from 01/12/14 - 01/14/14 updated: "No pre-authorization required for out-patient procedures" per [facility] [place] website portal.

## 2014-01-14 NOTE — Op Note (Signed)
01/14/2014  1:33 PM  PATIENT:  Robin Sherman  53 y.o. female  PRE-OPERATIVE DIAGNOSIS:  left medial meniscal tear  POST-OPERATIVE DIAGNOSIS:  left medial meniscal tear, osteoarthritis  PROCEDURE:  Procedure(s): KNEE ARTHROSCOPY WITH PARTIAL MEDIAL MENISECTOMY (Left) CHONDROPLASTY OF FEMUR (Left)  Operative findings the medial meniscus was torn at the root of the posterior horn. There was an oblique tear going right back to the red red zone. She also had grade 2 chondromalacia of the medial femoral condyle.  Lateral compartment normal trochlear patella compartment normal notch normal   SURGEON:  Surgeon(s) and Role:    * Carole Civil, MD - Primary  PHYSICIAN ASSISTANT:   ASSISTANTS: none   ANESTHESIA:   general Nose as this EBL:  Total I/O In: 1000 [I.V.:1000] Out: 10 [Blood:10]  BLOOD ADMINISTERED:none  DRAINS: none   LOCAL MEDICATIONS USED:  MARCAINE     SPECIMEN:  No Specimen  DISPOSITION OF SPECIMEN:  N/A  COUNTS:  YES  TOURNIQUET:    DICTATION: .Dragon Dictation  PLAN OF CARE: Discharge to home after PACU  PATIENT DISPOSITION:  PACU - hemodynamically stable.   Indications for procedure pain mechanical symptoms unresponsive to nonoperative treatment  The patient was identified in the preop holding area as Robin Sherman the left knee was confirmed as a surgical site and marked. The chart was reviewed  The patient was taken to the operating room and  was given appropriate preoperative antibiotic Ancef and general anesthesia was administered. The operative leg (left) was placed in the arthroscopic leg holder, the well leg was placed in a well leg holder  The left leg was then prepped and draped sterile The surgical site was confirmed and the timeout procedure was completed  The lateral portal was injected with Marcaine with epinephrine solution and a stab wound was made. The scope was placed in the lateral portal into the medial compartment. The   Diagnostic portion of the  arthroscopy was completed. A medial portal was established in the same fashion and a probe was placed into the joint. The diagnostic arthroscopy   was repeated using a probe to palpate intra-articular structures  A duckbill forceps was used to morcellized the meniscal tear, the fragments were removed with a motorized shaver. The meniscus was then balanced with an ArthroCare wand 50 probe.  A probe was then used to confirm a stable rim.  A chondroplasty of the medial femoral condyle was then performed to smooth the articular surface of the medial femoral condyle  The knee was irrigated meniscal fragments remaining were removed. The portals were closed with 3-0 nylon suture. The knee joint was then injected with 45 cc of Marcaine with epinephrine. A sterile dressing was applied followed by an Ace bandage and a Cryo/Cuff.  The patient was extubated and taken to the recovery room in stable condition.

## 2014-01-14 NOTE — Anesthesia Preprocedure Evaluation (Signed)
Anesthesia Evaluation  Patient identified by MRN, date of birth, ID band Patient awake    Reviewed: Allergy & Precautions, H&P , NPO status , Patient's Chart, lab work & pertinent test results  Airway Mallampati: III TM Distance: >3 FB     Dental  (+) Teeth Intact   Pulmonary asthma , Current Smoker,  breath sounds clear to auscultation        Cardiovascular hypertension, Pt. on medications Rhythm:Regular Rate:Normal     Neuro/Psych  Headaches,  Neuromuscular disease    GI/Hepatic negative GI ROS,   Endo/Other    Renal/GU      Musculoskeletal   Abdominal   Peds  Hematology   Anesthesia Other Findings   Reproductive/Obstetrics                           Anesthesia Physical Anesthesia Plan  ASA: II  Anesthesia Plan: General   Post-op Pain Management:    Induction: Intravenous  Airway Management Planned: LMA  Additional Equipment:   Intra-op Plan:   Post-operative Plan: Extubation in OR  Informed Consent: I have reviewed the patients History and Physical, chart, labs and discussed the procedure including the risks, benefits and alternatives for the proposed anesthesia with the patient or authorized representative who has indicated his/her understanding and acceptance.     Plan Discussed with:   Anesthesia Plan Comments:         Anesthesia Quick Evaluation

## 2014-01-14 NOTE — Anesthesia Postprocedure Evaluation (Signed)
  Anesthesia Post-op Note  Patient: Robin Sherman  Procedure(s) Performed: Procedure(s): KNEE ARTHROSCOPY WITH PARTIAL MEDIAL MENISECTOMY (Left) CHONDROPLASTY OF FEMUR (Left)  Patient Location: PACU  Anesthesia Type:General  Level of Consciousness: awake, alert , oriented and patient cooperative  Airway and Oxygen Therapy: Patient Spontanous Breathing and Patient connected to face mask oxygen  Post-op Pain: none  Post-op Assessment: Post-op Vital signs reviewed, Patient's Cardiovascular Status Stable, Respiratory Function Stable, Patent Airway, No signs of Nausea or vomiting and Pain level controlled  Post-op Vital Signs: Reviewed and stable  Last Vitals:  Filed Vitals:   01/14/14 1200  BP: 113/76  Pulse:   Temp:   Resp: 15    Complications: No apparent anesthesia complications

## 2014-01-14 NOTE — Transfer of Care (Signed)
Immediate Anesthesia Transfer of Care Note  Patient: Robin Sherman  Procedure(s) Performed: Procedure(s): KNEE ARTHROSCOPY WITH PARTIAL MEDIAL MENISECTOMY (Left) CHONDROPLASTY OF FEMUR (Left)  Patient Location: PACU  Anesthesia Type:General  Level of Consciousness: awake, alert , oriented and patient cooperative  Airway & Oxygen Therapy: Patient Spontanous Breathing and Patient connected to face mask oxygen  Post-op Assessment: Report given to PACU RN and Post -op Vital signs reviewed and stable  Post vital signs: Reviewed and stable  Complications: No apparent anesthesia complications

## 2014-01-15 ENCOUNTER — Encounter (HOSPITAL_COMMUNITY): Payer: Self-pay | Admitting: Orthopedic Surgery

## 2014-01-19 ENCOUNTER — Ambulatory Visit (INDEPENDENT_AMBULATORY_CARE_PROVIDER_SITE_OTHER): Payer: BC Managed Care – PPO | Admitting: Orthopedic Surgery

## 2014-01-19 ENCOUNTER — Encounter: Payer: Self-pay | Admitting: Orthopedic Surgery

## 2014-01-19 VITALS — Ht 64.0 in | Wt 224.0 lb

## 2014-01-19 DIAGNOSIS — Z9889 Other specified postprocedural states: Secondary | ICD-10-CM

## 2014-01-19 DIAGNOSIS — M23329 Other meniscus derangements, posterior horn of medial meniscus, unspecified knee: Secondary | ICD-10-CM

## 2014-01-19 DIAGNOSIS — M23322 Other meniscus derangements, posterior horn of medial meniscus, left knee: Secondary | ICD-10-CM

## 2014-01-19 NOTE — Patient Instructions (Addendum)
Call to arrange therapy at [facility] note until 02/14/14

## 2014-01-19 NOTE — Progress Notes (Signed)
Patient ID: Robin Sherman, female   DOB: 23-Feb-1961, 53 y.o.   MRN: [id number]  Chief Complaint  Patient presents with  . Follow-up    Post op #1, left knee. DOS 01-16-14.    Postop visit   S/P knee arthroscopy   Operative Findings  PRE-OPERATIVE DIAGNOSIS:  left medial meniscal tear  POST-OPERATIVE DIAGNOSIS:  left medial meniscal tear, osteoarthritis  PROCEDURE:  Procedure(s): KNEE ARTHROSCOPY WITH PARTIAL MEDIAL MENISECTOMY (Left) CHONDROPLASTY OF FEMUR (Left)  Operative findings the medial meniscus was torn at the root of the posterior horn. There was an oblique tear going right back to the red red zone. She also had grade 2 chondromalacia of the medial femoral condyle.  Lateral compartment normal trochlear patella compartment normal notch normal   Complaints no complaints at this time  Scissors were removed portals looked clean knee looks good flexing at 90  Plan start therapy, out of work until August 1 followup on July 30

## 2014-01-21 ENCOUNTER — Ambulatory Visit (HOSPITAL_COMMUNITY)
Admission: RE | Admit: 2014-01-21 | Discharge: 2014-01-21 | Disposition: A | Discharge status: Home / Self Care | Payer: BC Managed Care – PPO | Source: Ambulatory Visit | Attending: Orthopedic Surgery | Admitting: Orthopedic Surgery

## 2014-01-21 DIAGNOSIS — M25669 Stiffness of unspecified knee, not elsewhere classified: Secondary | ICD-10-CM | POA: Insufficient documentation

## 2014-01-21 DIAGNOSIS — I10 Essential (primary) hypertension: Secondary | ICD-10-CM | POA: Insufficient documentation

## 2014-01-21 DIAGNOSIS — IMO0001 Reserved for inherently not codable concepts without codable children: Secondary | ICD-10-CM | POA: Insufficient documentation

## 2014-01-21 DIAGNOSIS — M25569 Pain in unspecified knee: Secondary | ICD-10-CM | POA: Insufficient documentation

## 2014-01-21 DIAGNOSIS — R262 Difficulty in walking, not elsewhere classified: Secondary | ICD-10-CM | POA: Insufficient documentation

## 2014-01-21 DIAGNOSIS — E669 Obesity, unspecified: Secondary | ICD-10-CM | POA: Insufficient documentation

## 2014-01-21 NOTE — Evaluation (Signed)
Physical Therapy Evaluation  Patient Details  Name: Robin Sherman MRN: [id number] Date of Birth: 19-Jun-1961  Today's Date: 01/21/2014 Time: 1914-7829 PT Time Calculation (min): 55 min     Charges: 1 Evaluation, TherEx 1120-1145         Visit#: 1 of 16  Re-eval: 02/20/14 Assessment Diagnosis: Lt knee stiffness s/p Lt knee arthroscopy.  Surgical Date: 01/14/14 Next MD Visit: 02/09/14 Dr. Aline Brochure Prior Therapy: no  Authorization: BCBS    Authorization Visit#: 1 of 30   Past Medical History:  Past Medical History  Diagnosis Date  . Hypertension   . Asthma   . Eczema     Followed by Dr. Nevada Crane dermatology   Past Surgical History:  Past Surgical History  Procedure Laterality Date  . Tubal ligation      [facility]  . Colonoscopy N/A 01/27/2013    Procedure: COLONOSCOPY;  Surgeon: Danie Binder, MD;  Location: [facility];  Service: Endoscopy;  Laterality: N/A;  8:30 AM  . Supracervical abdominal hysterectomy N/A 08/01/2013    Procedure: HYSTERECTOMY SUPRACERVICAL ABDOMINAL;  Surgeon: Jonnie Kind, MD;  Location: [facility] ORS;  Service: Gynecology;  Laterality: N/A;  . Bilateral salpingectomy Bilateral 08/01/2013    Procedure: BILATERAL SALPINGECTOMY;  Surgeon: Jonnie Kind, MD;  Location: [facility] OR[facility];  Service: Gynecology;  Laterality: Bilateral;  . Hematoma evacuation N/A 08/03/2013    Procedure: EVACUATION PELVIC HEMATOMA;  Surgeon: Jonnie Kind, MD;  Location: [facility];  Service: Gynecology;  Laterality: N/A;  . Abdominal hysterectomy    . Knee arthroscopy with medial menisectomy Left 01/14/2014    Procedure: KNEE ARTHROSCOPY WITH PARTIAL MEDIAL MENISECTOMY;  Surgeon: Carole Civil, MD;  Location: [facility];  Service: Orthopedics;  Laterality: Left;  . 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 Pt lt knee had torn ligaments, arthritis and was swollen with fluid. Patient now seen s/p 01/14/14 knee arthroscopic surger During which MD found a left medial meniscal tear, osteoarthritis that was repaired and cleaned.  Patient has since not had any therapy.  Limitations: Walking;Standing How long can you stand comfortably?: <75minutes How long can you walk comfortably?: <3 minutes in mhome and to car.  Patient Stated Goals: to be able to go up and down stairs without pain. 3 steps to go inside home, and 16 in home Pain Assessment Currently in Pain?: Yes Pain Score: 3  (controlled with medication) Pain Location: Knee Pain Orientation: Left;Anterior;Medial;Lateral;Proximal Pain Type: Acute pain;Surgical pain Pain Radiating Towards: up along the side Pain Onset: In the past 7 days Pain Frequency: Constant Pain Relieving Factors: medications, ice Effect of Pain on Daily Activities: stairs, walking, standing, performing sit to stand.  Precautions/Restrictions  Precautions Precaution Comments: Patient unable to fully weight bear on Lt LE secondary to pain  Restrictions Weight Bearing Restrictions: Yes LLE Weight Bearing: Weight bearing as tolerated  Cognition/Observation Observation/Other Assessments Observations: 3D hip excursions: limited Lt LE weight shiftign and pain increased with squatting and Rt Rotation.  Other Assessments: FOTO: 44% status, Limitation 56% status  Sensation/Coordination/Flexibility/Functional Tests Flexibility 90/90: Positive Functional Tests Functional  Tests: Limited piriformis mobility   Assessment LLE AROM (degrees) Left Knee Extension: -5 Left Knee Flexion: 110 LLE Strength Left Hip Flexion:  4/5 Left Knee Flexion: 3+/5 Left Knee Extension: 3+/5 Left Ankle Dorsiflexion: 5/5  Exercise/Treatments Stretches Active Hamstring Stretch: 2 reps;10 seconds;Limitations Active Hamstring Stretch Limitations: 3 way  Hip Flexor Stretch: 2 reps;10 seconds Gastroc Stretch: 10 seconds;5 reps Standing Other Standing Knee Exercises: 3D hip excursion 10x  Other Standing Knee Exercises: Groin stretch 2 way, 2x 10seconds.  Supine Quad Sets: Left;10 reps;AROM Heel Slides: 10 reps;AAROM;Left  Physical Therapy Assessment and Plan PT Assessment and Plan Clinical Impression Statement: Pt displays Lt knee pain and Lt knee stiffness following Lt Knee arthroplasty to repair her medial meniscus that was spontaneously injured at work.  Patient will benefit from skilled physical therapy to restore Lt knee mobility to normal, decrease pain and increase Lt LE mobility so patient can return to workign without pain and ambulate up and down a flight of stairs withotu pain to be able to do all her homemaking activites required of her.. Pt will benefit from skilled therapeutic intervention in order to improve on the following deficits: Abnormal gait;Decreased activity tolerance;Decreased balance;Difficulty walking;Decreased strength;Decreased range of motion;Increased edema;Increased fascial restricitons;Pain Rehab Potential: Good Clinical Impairments Affecting Rehab Potential: Patient is highly motivated and has no prior history of knee pain before injury.  PT Frequency: Min 2X/week PT Duration: 8 weeks PT Treatment/Interventions: Gait training;Stair training;Functional mobility training;Therapeutic activities;Therapeutic exercise;Balance training;Modalities;Manual techniques;Patient/family education PT Plan: Initial focus on restoring full knee, hip and ankle AROM and decreasing pain. As pain improves initiate and progress hip and knee strength.     Goals Home Exercise Program Pt/caregiver will Perform Home  Exercise Program: For increased ROM PT Goal: Perform Home Exercise Program - Progress: Goal set today PT Short Term Goals Time to Complete Short Term Goals: 4 weeks PT Short Term Goal 1: Patient will demonstrate improved knee extension to 0 degrees to normalize gait PT Short Term Goal 2: Patient will demosntrate improved knee flexion to 120 degrres to allow patient to sit to a chair with weightbearign through [place] throughout descent.  PT Short Term Goal 3: Patient will increased Lt knee flexion/extension strength to 4/5 MMT to be able to perform sit to stand to and from chair 5x in <15 seconds indicating patient not at a high falls risk.  PT Short Term Goal 4: Patient will be able to ambulate 62minutes with pain less than 3/10 to be able to perform short bouts of walking for exercise.  PT Long Term Goals Time to Complete Long Term Goals: 8 weeks PT Long Term Goal 1: Patient will be able to ambulate >39minutes minutes with pain less than 2/10 to be able to go grocery shopping  PT Long Term Goal 2: Patient will increased Lt knee flexion/extension strength to 5/5 MMT to be able toambulate up and down stairs without handrail assist  Long Term Goal 3: Patient will demonstrate glut max/med strength of 4/5 MMT to be bale to perform single leg stand on Lt for >10seconds  Problem List Patient Active Problem List   Diagnosis Date Noted  . Acute meniscal tear of left knee 12/23/2013  . Bursitis of left knee 11/03/2013  . Insomnia 10/29/2013  . Sciatica of left side 10/13/2013  . Arthritis of knee, degenerative 10/13/2013  . Effusion of knee joint, left 10/13/2013  . Left knee pain 10/13/2013  . UTI (urinary tract infection) 08/15/2013  . Pain, joint, multiple sites  04/15/2013  . Arthritis 12/02/2012  . Headache 09/25/2012  . Hand dermatitis 03/02/2012  . Essential hypertension, benign 09/21/2011  . Obesity, unspecified 09/21/2011  . Shoulder pain, right 09/21/2011  . Asthma, intermittent  09/21/2011  . Tobacco user 09/21/2011  . TRIGGER FINGER 11/03/2008    PT - End of Session Activity Tolerance: Patient tolerated treatment well General Behavior During Therapy: WFL for tasks assessed/performed PT Plan of Care PT Home Exercise Plan: hamstring, hip flexor, groin, and calf stretches, quad set and heel slide PT Patient Instructions: 2-3x daily  Consulted and Agree with Plan of Care: Patient  GP    Leia Alf 01/21/2014, 12:15 PM  Physician Documentation Your signature is required to indicate approval of the treatment plan as stated above.  Please sign and either send electronically or make a copy of this report for your files and return this physician signed original.   Please mark one 1.__approve of plan  2. ___approve of plan with the following conditions.   ______________________________                                                          _____________________ Physician Signature                                                                                                             Date SBNR

## 2014-01-28 ENCOUNTER — Ambulatory Visit (HOSPITAL_COMMUNITY)
Admission: RE | Admit: 2014-01-28 | Discharge: 2014-01-28 | Disposition: A | Discharge status: Home / Self Care | Payer: BC Managed Care – PPO | Source: Ambulatory Visit | Attending: Orthopedic Surgery | Admitting: Orthopedic Surgery

## 2014-01-28 NOTE — Progress Notes (Signed)
Physical Therapy Treatment Patient Details  Name: Robin Sherman MRN: [id number] Date of Birth: Aug 31, 1960  Today's Date: 01/28/2014 Time: 1018-1100 PT Time Calculation (min): 42 min  Visit#: 2 of 16  Re-eval: 02/20/14 Authorization: BCBS  Authorization Visit#: 2 of 30 Charges:  therex 42   Subjective: Symptoms/Limitations Symptoms: Pt states her knee began throbbing this morning so she took pain meds.  Currently without pain.  States she took a short walk yesterday without increased pain.  Patient is ambulating with SPC today. Pain Assessment Currently in Pain?: No/denies   Exercise/Treatments Stretches Active Hamstring Stretch: 5 reps;10 seconds;Limitations Active Hamstring Stretch Limitations: 3 way on 14" box Lt only Hip Flexor Stretch: 5 reps;10 seconds;Limitations Hip Flexor Stretch Limitations: 14" box bilateral LE Gastroc Stretch: 5 reps;10 seconds;Limitations Gastroc Stretch Limitations: slant board Aerobic Stationary Bike: 8 minutes seat 8 full revolutions Standing Lateral Step Up: Left;10 reps;Step Height: 4";Hand Hold: 1 Forward Step Up: Left;10 reps;Step Height: 4";Hand Hold: 1 Other Standing Knee Exercises: 3D hip excursion 10x  Other Standing Knee Exercises: Groin stretch 2 way, 5x 10seconds 14" box Lt only    Physical Therapy Assessment and Plan PT Assessment and Plan Clinical Impression Statement: Progressed with lateral and forward step ups to begin working on functional strengthening.  Pt without complaint performing any activity.  Noted difficulty of lateral step up due to weakness.  Pt requires cues to ambulate heel-toe and decrease antalgia. suggested patient ice at home.   PT Plan: Initial focus on restoring full knee, hip and ankle AROM and decreasing pain. As pain improves initiate and progress hip and knee strength.        Problem List Patient Active Problem List   Diagnosis Date Noted  . Acute meniscal tear of left knee 12/23/2013  . Bursitis  of left knee 11/03/2013  . Insomnia 10/29/2013  . Sciatica of left side 10/13/2013  . Arthritis of knee, degenerative 10/13/2013  . Effusion of knee joint, left 10/13/2013  . Left knee pain 10/13/2013  . UTI (urinary tract infection) 08/15/2013  . Pain, joint, multiple sites 04/15/2013  . Arthritis 12/02/2012  . Headache 09/25/2012  . Hand dermatitis 03/02/2012  . Essential hypertension, benign 09/21/2011  . Obesity, unspecified 09/21/2011  . Shoulder pain, right 09/21/2011  . Asthma, intermittent 09/21/2011  . Tobacco user 09/21/2011  . TRIGGER FINGER 11/03/2008    PT - End of Session Activity Tolerance: Patient tolerated treatment well General Behavior During Therapy: [facility] for tasks assessed/performed  GP    Teena Irani, PTA/CLT 01/28/2014, 11:04 AM

## 2014-01-30 ENCOUNTER — Ambulatory Visit (HOSPITAL_COMMUNITY)
Admission: RE | Admit: 2014-01-30 | Discharge: 2014-01-30 | Disposition: A | Discharge status: Home / Self Care | Payer: BC Managed Care – PPO | Source: Ambulatory Visit | Attending: Orthopedic Surgery | Admitting: Orthopedic Surgery

## 2014-01-30 NOTE — Progress Notes (Signed)
Physical Therapy Treatment Patient Details  Name: Robin Sherman MRN: [id number] Date of Birth: [birth year]  Today's Date: 01/30/2014 Time: 4332-9518 PT Time Calculation (min): 53 min Charge: TE 8416-6063  Visit#: 3 of 16  Re-eval: 02/20/14 Assessment Diagnosis: Lt knee stiffness s/p Lt knee arthroscopy.  Surgical Date: 01/14/14 Next MD Visit: 02/09/14 Dr. Aline Brochure Prior Therapy: no  Authorization: BCBS  Authorization Time Period:    Authorization Visit#: 3 of 30   Subjective: Symptoms/Limitations Symptoms: Pt stated knee was feeling better today, no real pain currently.  Stated knee was a little tender this morning, has taken pain medication prior therapy today. Pain Assessment Currently in Pain?: No/denies  Precautions/Restrictions  Restrictions Weight Bearing Restrictions: Yes LLE Weight Bearing: Weight bearing as tolerated  Exercise/Treatments Stretches Active Hamstring Stretch: 3 reps;30 seconds;Limitations Active Hamstring Stretch Limitations: 3 way on 14" box Lt only Quad Stretch: 3 reps;30 seconds;Limitations Quad Stretch Limitations: prone with rope Hip Flexor Stretch: 5 reps;10 seconds;Limitations Hip Flexor Stretch Limitations: 14" box bilateral LE Knee: Self-Stretch to increase Flexion: Limitations Knee: Self-Stretch Limitations: 10x 3" for ROM Gastroc Stretch: 3 reps;30 seconds;Limitations Gastroc Stretch Limitations: slant board Aerobic Stationary Bike: 8 minutes seat 8 full revolutions Standing Knee Flexion: 15 reps Lateral Step Up: Left;15 reps;Hand Hold: 1;Step Height: 4" Forward Step Up: Left;15 reps;Hand Hold: 0;Step Height: 4" Step Down: Left;10 reps;Hand Hold: 1;Step Height: 4" Other Standing Knee Exercises: 3D hip excursion 10x  Supine Quad Sets: Left;10 reps;AROM Prone  Hip Extension: Left;10 reps Other Prone Exercises: TKE 10x5"    Physical Therapy Assessment and Plan PT Assessment and Plan Clinical Impression Statement: AROM  progressing well 1-125.  Continued with stretches initially to improve flexibilty and progressed functional strengthening exercises this session.  Began step down training to improve eccentric quad control with cueing to reduce compensation for maximum functional strengthneing benefit.  Began prone exercises for TKE and gluteal strengtheing with initial cueing for form, able to complete without difficutly.  Adjusted SPC height and cued pt for equalize stance phase and stride length and heel to toe pattern with improved gait mechanics noted following.  Pt encouraged to apply ice to knee with elevation at home for pain and edema control.  No reports of pain through session, pt stated she felt looser and less tender.   PT Plan: Continue wtih current POC with stretches initially, AROM, and functioanl strengtheing.  Progress hip and knee strength as pain allows.  Next sessoin focus on weight distribution activiteis to improve gait (rockerboard, 3D hip excursion).    Goals PT Short Term Goals PT Short Term Goal 1: Patient will demonstrate improved knee extension to 0 degrees to normalize gait PT Short Term Goal 1 - Progress: Progressing toward goal PT Short Term Goal 2: Patient will demosntrate improved knee flexion to 120 degrres to allow patient to sit to a chair with weightbearign through [place] throughout descent.  PT Short Term Goal 2 - Progress: Progressing toward goal PT Short Term Goal 3: Patient will increased Lt knee flexion/extension strength to 4/5 MMT to be able to perform sit to stand to and from chair 5x in <15 seconds indicating patient not at a high falls risk.  PT Short Term Goal 3 - Progress: Progressing toward goal PT Short Term Goal 4: Patient will be able to ambulate 26minutes with pain less than 3/10 to be able to perform short bouts of walking for exercise.  PT Short Term Goal 4 - Progress: Progressing toward goal  Problem List Patient Active  Problem List   Diagnosis Date Noted  .  Acute meniscal tear of left knee 12/23/2013  . Bursitis of left knee 11/03/2013  . Insomnia 10/29/2013  . Sciatica of left side 10/13/2013  . Arthritis of knee, degenerative 10/13/2013  . Effusion of knee joint, left 10/13/2013  . Left knee pain 10/13/2013  . UTI (urinary tract infection) 08/15/2013  . Pain, joint, multiple sites 04/15/2013  . Arthritis 12/02/2012  . Headache 09/25/2012  . Hand dermatitis 03/02/2012  . Essential hypertension, benign 09/21/2011  . Obesity, unspecified 09/21/2011  . Shoulder pain, right 09/21/2011  . Asthma, intermittent 09/21/2011  . Tobacco user 09/21/2011  . TRIGGER FINGER 11/03/2008    PT - End of Session Activity Tolerance: Patient tolerated treatment well General Behavior During Therapy: [facility] for tasks assessed/performed  GP    Aldona Lento 01/30/2014, 11:58 AM

## 2014-02-02 ENCOUNTER — Ambulatory Visit (INDEPENDENT_AMBULATORY_CARE_PROVIDER_SITE_OTHER): Payer: BC Managed Care – PPO | Admitting: Family Medicine

## 2014-02-02 ENCOUNTER — Encounter: Payer: Self-pay | Admitting: Family Medicine

## 2014-02-02 ENCOUNTER — Encounter: Payer: Self-pay | Admitting: *Deleted

## 2014-02-02 ENCOUNTER — Other Ambulatory Visit: Payer: Self-pay | Admitting: *Deleted

## 2014-02-02 ENCOUNTER — Encounter: Payer: BC Managed Care – PPO | Admitting: Family Medicine

## 2014-02-02 VITALS — BP 136/84 | HR 82 | Temp 98.4°F | Resp 14 | Ht 64.0 in | Wt 227.0 lb

## 2014-02-02 DIAGNOSIS — F17200 Nicotine dependence, unspecified, uncomplicated: Secondary | ICD-10-CM

## 2014-02-02 DIAGNOSIS — R7301 Impaired fasting glucose: Secondary | ICD-10-CM

## 2014-02-02 DIAGNOSIS — N898 Other specified noninflammatory disorders of vagina: Secondary | ICD-10-CM

## 2014-02-02 DIAGNOSIS — Z124 Encounter for screening for malignant neoplasm of cervix: Secondary | ICD-10-CM

## 2014-02-02 DIAGNOSIS — Z0000 Encounter for general adult medical examination without abnormal findings: Secondary | ICD-10-CM

## 2014-02-02 DIAGNOSIS — R300 Dysuria: Secondary | ICD-10-CM

## 2014-02-02 DIAGNOSIS — Z1231 Encounter for screening mammogram for malignant neoplasm of breast: Secondary | ICD-10-CM

## 2014-02-02 LAB — URINALYSIS, ROUTINE W REFLEX MICROSCOPIC
Bilirubin Urine: NEGATIVE
GLUCOSE, UA: NEGATIVE mg/dL
Hgb urine dipstick: NEGATIVE
Ketones, ur: NEGATIVE mg/dL
Leukocytes, UA: NEGATIVE
Nitrite: NEGATIVE
PROTEIN: NEGATIVE mg/dL
Specific Gravity, Urine: 1.025 (ref 1.005–1.030)
Urobilinogen, UA: 0.2 mg/dL (ref 0.0–1.0)
pH: 5.5 (ref 5.0–8.0)

## 2014-02-02 LAB — HEMOGLOBIN A1C, FINGERSTICK: Hgb A1C (fingerstick): 6 % — ABNORMAL HIGH (ref <5.7)

## 2014-02-02 LAB — WET PREP FOR TRICH, YEAST, CLUE
Trich, Wet Prep: NONE SEEN
Yeast Wet Prep HPF POC: NONE SEEN

## 2014-02-02 MED ORDER — HYDROXYZINE PAMOATE 50 MG PO CAPS: 50.0000 mg | ORAL_CAPSULE | Freq: Three times a day (TID) | ORAL | Status: DC | PRN

## 2014-02-02 MED ORDER — HALOBETASOL PROPIONATE 0.05 % EX CREA: TOPICAL_CREAM | Freq: Two times a day (BID) | CUTANEOUS | Status: DC

## 2014-02-02 MED ORDER — VARENICLINE TARTRATE 0.5 MG X 11 & 1 MG X 42 PO MISC: ORAL | Status: DC

## 2014-02-02 MED ORDER — ZOLPIDEM TARTRATE 10 MG PO TABS: 10.0000 mg | ORAL_TABLET | Freq: Every evening | ORAL | Status: DC | PRN

## 2014-02-02 MED ORDER — METRONIDAZOLE 500 MG PO TABS: 500.0000 mg | ORAL_TABLET | Freq: Two times a day (BID) | ORAL | Status: DC

## 2014-02-02 NOTE — Patient Instructions (Signed)
We will call with results  Continue current medications Repeat PAP smear in 3 years if normal Mammogram to be scheduled F/U for mole removal in the next 1-2 months

## 2014-02-03 ENCOUNTER — Encounter: Payer: Self-pay | Admitting: *Deleted

## 2014-02-03 ENCOUNTER — Ambulatory Visit (HOSPITAL_COMMUNITY)
Admission: RE | Admit: 2014-02-03 | Discharge: 2014-02-03 | Disposition: A | Discharge status: Home / Self Care | Payer: BC Managed Care – PPO | Source: Ambulatory Visit | Attending: Orthopedic Surgery | Admitting: Orthopedic Surgery

## 2014-02-03 LAB — PAP THINPREP ASCUS RFLX HPV RFLX TYPE

## 2014-02-03 NOTE — Progress Notes (Signed)
Patient ID: Robin Sherman, female   DOB: 11-21-1960, 53 y.o.   MRN: [id number]   Subjective:    Patient ID: Robin Sherman, female    DOB: November 06, 1960, 53 y.o.   MRN: [id number]  Patient presents for CPE and Possible UTI  patient here for complete physical exam. She status post supracervical hysterectomy in 2015. She is overdue for mammogram. Fasting labs were reviewed significant for an elevated fasting glucose. She does complain of odorous smell to her urine and vaginal area. She also had some mild flank pain which is now resolved. No vaginal bleeding or hematuria. She still being followed by orthopedics for her left knee surgery.  She would also like to try medication help her quit smoking. She smokes a half a pack per day  Review Of Systems:  GEN- denies fatigue, fever, weight loss,weakness, recent illness HEENT- denies eye drainage, change in vision, nasal discharge, CVS- denies chest pain, palpitations RESP- denies SOB, cough, wheeze ABD- denies N/V, change in stools, abd pain GU- denies dysuria, hematuria, dribbling, incontinence MSK- + joint pain, muscle aches, injury Neuro- denies headache, dizziness, syncope, seizure activity       Objective:    BP 136/84  Pulse 82  Temp(Src) 98.4 F (36.9 C) (Oral)  Resp 14  Ht 5\' 4"  (1.626 m)  Wt 227 lb (102.967 kg)  BMI 38.95 kg/m2  LMP 04/15/2013 GEN- NAD, alert and oriented x3 HEENT- PERRL, EOMI, non injected sclera, pink conjunctiva, MMM, oropharynx clear Neck- Supple, no thyromegaly Breast- normal symmetry, no nipple inversion,no nipple drainage, no nodules or lumps felt Nodes- no axillary nodes CVS- RRR, no murmur RESP-CTAB ABD-NABS,soft,NT,ND GU- normal external genitalia, vaginal mucosa pink and moist, cervix visualized no growth, no blood form os, + discharge, no CMT, no ovarian masses, no uterus palpated Rectum- normal external apperance, normal tone, FOBT Neg EXT- No edema Pulses- Radial, DP- 2+         Assessment & Plan:      Problem List Items Addressed This Visit   None    Visit Diagnoses   Routine general medical examination at a health care facility    -  Primary    CPE done, PAP Smear     Burning with urination        No sign of UTI, vaginal cultures to be done for wet prep    Relevant Orders       Urinalysis, Routine w reflex microscopic (Completed)    Screening for malignant neoplasm of the cervix        Relevant Orders       PAP, ThinPrep ASCUS Rflx HPV Rflx Type    Tobacco use disorder        Trial of chantix, pt ready to quit    Relevant Medications       varenicline (CHANTIX STARTING MONTH PAK) 0.5 MG X 11 & 1 MG X 42 tablet    Elevated fasting glucose        check A1C    Relevant Orders       Hemoglobin A1C, fingerstick (Completed)    Other screening mammogram        Relevant Orders       MM DIGITAL SCREENING BILATERAL    Vaginal discharge        Relevant Orders       WET PREP FOR [place], YEAST, CLUE (Completed)       Note: This dictation was prepared with Dragon dictation along with smaller phrase technology. Any  transcriptional errors that result from this process are unintentional.

## 2014-02-03 NOTE — Progress Notes (Signed)
Physical Therapy Treatment Patient Details  Name: Robin Sherman MRN: [id number] Date of Birth: 1960/07/21  Today's Date: 02/03/2014 Time: 1017-1110 PT Time Calculation (min): 53 min  Visit#: 4 of 16  Re-eval: 02/20/14 Authorization: BC[employer]  Authorization Visit#: 4 of 30  Charges:  therex 1017-1059 (42) , ice 1100-1110 (10')  Subjective: Symptoms/Limitations Symptoms: Pt states her pain is 3/10 in her LT knee.  States her medicine has no effect on her pain.   Pain Assessment Currently in Pain?: Yes Pain Score: 3  Pain Location: Knee Pain Orientation: Left  Exercise/Treatments Stretches Active Hamstring Stretch: 3 reps;30 seconds;Limitations Active Hamstring Stretch Limitations: 3 way on 14" box Lt only Hip Flexor Stretch: 5 reps;10 seconds;Limitations Hip Flexor Stretch Limitations: 14" box bilateral LE Knee: Self-Stretch to increase Flexion: Limitations Knee: Self-Stretch Limitations: 10x 3" for ROM Gastroc Stretch: 3 reps;30 seconds;Limitations Gastroc Stretch Limitations: slant board Aerobic Stationary Bike: 8 minutes seat 8;  full revolutions Standing Knee Flexion: 15 reps Forward Step Up: Left;Hand Hold: 1;10 reps;Step Height: 6"   Modalities Modalities: Cryotherapy Cryotherapy Number Minutes Cryotherapy: 10 Minutes Cryotherapy Location: Knee Type of Cryotherapy: Ice pack  Physical Therapy Assessment and Plan PT Assessment and Plan Clinical Impression Statement: AROM 0-125. Patient still complains of lateral knee and distal quadricep tightness. Able to increase to 6" forward step ups but unable to complete lateral and forward step downs with 6" due to discomfort.  Encouraged patient to do self manual to this area to decrease adhesions. Pt verbalized understanding and demonstrated technique.  Improved gait quality also noted today PT Plan: Continue wtih current POC with stretches initially, AROM, and functional strengtheing.  Continue to encourage even weight  distribution.  Switch to treadmill next visit to improve gait and work to gait without AD.     Problem List Patient Active Problem List   Diagnosis Date Noted  . Acute meniscal tear of left knee 12/23/2013  . Bursitis of left knee 11/03/2013  . Insomnia 10/29/2013  . Sciatica of left side 10/13/2013  . Arthritis of knee, degenerative 10/13/2013  . Effusion of knee joint, left 10/13/2013  . Left knee pain 10/13/2013  . UTI (urinary tract infection) 08/15/2013  . Pain, joint, multiple sites 04/15/2013  . Arthritis 12/02/2012  . Headache 09/25/2012  . Hand dermatitis 03/02/2012  . Essential hypertension, benign 09/21/2011  . Obesity, unspecified 09/21/2011  . Shoulder pain, right 09/21/2011  . Asthma, intermittent 09/21/2011  . Tobacco user 09/21/2011  . TRIGGER FINGER 11/03/2008    PT - End of Session Activity Tolerance: Patient tolerated treatment well General Behavior During Therapy: [facility] for tasks assessed/performed  GP    Teena Irani, PTA/CLT 02/03/2014, 11:02 AM

## 2014-02-05 ENCOUNTER — Ambulatory Visit (HOSPITAL_COMMUNITY)
Admission: RE | Admit: 2014-02-05 | Discharge: 2014-02-05 | Disposition: A | Discharge status: Home / Self Care | Payer: BC Managed Care – PPO | Source: Ambulatory Visit | Attending: Family Medicine | Admitting: Family Medicine

## 2014-02-05 ENCOUNTER — Ambulatory Visit (HOSPITAL_COMMUNITY)
Admission: RE | Admit: 2014-02-05 | Discharge: 2014-02-05 | Disposition: A | Discharge status: Home / Self Care | Payer: BC Managed Care – PPO | Source: Ambulatory Visit | Attending: Orthopedic Surgery | Admitting: Orthopedic Surgery

## 2014-02-05 DIAGNOSIS — Z1231 Encounter for screening mammogram for malignant neoplasm of breast: Secondary | ICD-10-CM

## 2014-02-05 NOTE — Progress Notes (Signed)
Physical Therapy Treatment Patient Details  Name: Robin Sherman MRN: [id number] Date of Birth: [birth year]  Today's Date: 02/05/2014 Time: 1022-1105 PT Time Calculation (min): 43 min  Visit#: 5 of 16  Re-eval: 02/20/14 Authorization: BCBS  Authorization Visit#: 5 of 30  Charges:  therex 1022-1047 (25'), manual 1047-1055 (8'), ice 1055-1105 (10')  Subjective: Symptoms/Limitations Symptoms: Pt states her pain is increased to 7/10 today.  No known reason.  Pain Assessment Currently in Pain?: Yes Pain Score: 7  Pain Location: Knee Pain Orientation: Left   Exercise/Treatments Stretches Active Hamstring Stretch: 1 rep;30 seconds;Limitations Active Hamstring Stretch Limitations: 3 way on 14" box Lt only Gastroc Stretch: 3 reps;30 seconds;Limitations Gastroc Stretch Limitations: slant board Aerobic Stationary Bike: 8 minutes seat 8;  full revolutions Tread Mill: add next visit instead of bike to work on even weight distribution/gait quality. Standing Forward Lunges: Right;10 reps;Limitations Forward Lunges Limitations: 8" step Other Standing Knee Exercises: 3D hip excursion 10x    Manual Therapy Manual Therapy: Other (comment) Other Manual Therapy: myofascial techniques and massage to Lt knee focusing on lateral aspect Cryotherapy Number Minutes Cryotherapy: 10 Minutes Cryotherapy Location: Neck Type of Cryotherapy: Ice pack  Physical Therapy Assessment and Plan PT Assessment and Plan Clinical Impression Statement: Added manual techniques to help decrease discomfort around knee followed by ice at end of session.  No extreme tightness/adhesions or spasms noted.  PT reported overall improvment.  PT Plan: Continue wtih current POC with stretches initially, AROM, and functional strengtheing.  Continue to encourage even weight distribution.  Add treadmill instead of bike next visit.     Problem List Patient Active Problem List   Diagnosis Date Noted  . Acute meniscal tear of  left knee 12/23/2013  . Bursitis of left knee 11/03/2013  . Insomnia 10/29/2013  . Sciatica of left side 10/13/2013  . Arthritis of knee, degenerative 10/13/2013  . Effusion of knee joint, left 10/13/2013  . Left knee pain 10/13/2013  . UTI (urinary tract infection) 08/15/2013  . Pain, joint, multiple sites 04/15/2013  . Arthritis 12/02/2012  . Headache 09/25/2012  . Hand dermatitis 03/02/2012  . Essential hypertension, benign 09/21/2011  . Obesity, unspecified 09/21/2011  . Shoulder pain, right 09/21/2011  . Asthma, intermittent 09/21/2011  . Tobacco user 09/21/2011  . TRIGGER FINGER 11/03/2008    PT - End of Session Activity Tolerance: Patient tolerated treatment well General Behavior During Therapy: [facility] for tasks assessed/performed  GP    Teena Irani, PTA/CLT 02/05/2014, 12:01 PM

## 2014-02-06 ENCOUNTER — Ambulatory Visit (HOSPITAL_COMMUNITY): Payer: BC Managed Care – PPO | Admitting: Physical Therapy

## 2014-02-09 ENCOUNTER — Ambulatory Visit (INDEPENDENT_AMBULATORY_CARE_PROVIDER_SITE_OTHER): Payer: BC Managed Care – PPO | Admitting: Orthopedic Surgery

## 2014-02-09 ENCOUNTER — Ambulatory Visit: Payer: BC Managed Care – PPO | Admitting: Orthopedic Surgery

## 2014-02-09 ENCOUNTER — Encounter: Payer: Self-pay | Admitting: Orthopedic Surgery

## 2014-02-09 ENCOUNTER — Ambulatory Visit (HOSPITAL_COMMUNITY)
Admission: RE | Admit: 2014-02-09 | Payer: BC Managed Care – PPO | Source: Ambulatory Visit | Attending: Orthopedic Surgery | Admitting: Orthopedic Surgery

## 2014-02-09 ENCOUNTER — Other Ambulatory Visit: Payer: Self-pay | Admitting: Family Medicine

## 2014-02-09 VITALS — BP 139/89 | Ht 64.0 in | Wt 227.0 lb

## 2014-02-09 DIAGNOSIS — Z9889 Other specified postprocedural states: Secondary | ICD-10-CM

## 2014-02-09 DIAGNOSIS — M23329 Other meniscus derangements, posterior horn of medial meniscus, unspecified knee: Secondary | ICD-10-CM

## 2014-02-09 DIAGNOSIS — M1712 Unilateral primary osteoarthritis, left knee: Secondary | ICD-10-CM

## 2014-02-09 DIAGNOSIS — M23322 Other meniscus derangements, posterior horn of medial meniscus, left knee: Secondary | ICD-10-CM

## 2014-02-09 DIAGNOSIS — M1710 Unilateral primary osteoarthritis, unspecified knee: Secondary | ICD-10-CM

## 2014-02-09 DIAGNOSIS — R928 Other abnormal and inconclusive findings on diagnostic imaging of breast: Secondary | ICD-10-CM

## 2014-02-09 MED ORDER — OXYCODONE-ACETAMINOPHEN 5-325 MG PO TABS: 1.0000 | ORAL_TABLET | Freq: Four times a day (QID) | ORAL | Status: DC | PRN

## 2014-02-09 NOTE — Progress Notes (Signed)
Patient ID: Robin Sherman, female   DOB: February 28, 1961, 53 y.o.   MRN: [id number] Chief Complaint  Patient presents with  . Follow-up    3 week recheck left knee, DOS 01/14/14   BP 139/89  Ht 5\' 4"  (1.626 m)  Wt 227 lb (102.967 kg)  BMI 38.95 kg/m2  LMP 04/15/2013  Encounter Diagnoses  Name Primary?  . S/P arthroscopic knee surgery Yes  . Derangement of posterior horn of medial meniscus, left   . Primary osteoarthritis of left knee     Arthroscopy partial medial meniscectomy chondroplasty of the femur  She is going well has some pain when she goes up and down stairs she still using her cane. However she was quite work August 1  I suggested maybe about 4 more weeks with home therapy but she needs to go back to work because of financial issues  Knee looks good range of motion is returned to normal she has good strength she has no crepitus on patellofemoral compression test  She probably has some weakness in the quadriceps muscle I recommended home therapy follow up in 4 weeks okay to return to work August 1

## 2014-02-09 NOTE — Patient Instructions (Addendum)
Home exercises   4 weeks f/u

## 2014-02-10 ENCOUNTER — Other Ambulatory Visit: Payer: Self-pay | Admitting: Family Medicine

## 2014-02-11 ENCOUNTER — Ambulatory Visit (HOSPITAL_COMMUNITY)
Admission: RE | Admit: 2014-02-11 | Discharge: 2014-02-11 | Disposition: A | Discharge status: Home / Self Care | Payer: BC Managed Care – PPO | Source: Ambulatory Visit | Attending: Orthopedic Surgery | Admitting: Orthopedic Surgery

## 2014-02-11 NOTE — Progress Notes (Signed)
Physical Therapy Treatment Patient Details  Name: Robin Sherman MRN: [id number] Date of Birth: 04/06/1961  Today's Date: 02/11/2014 Time: 1020-1102 PT Time Calculation (min): 42 min Visit#: 6 of 16  Re-eval: 02/20/14 Authorization: BCBS  Authorization Visit#: 6 of 30  Charges:  therex 1020-1054 (34'), manual 1054-1102 (8')  Subjective: Symptoms/Limitations Symptoms: PT states she went to MD and intends on going back to work next week (Aug 1).  States she currently has some pain 4/10 in Lt lateral knee. Pain Assessment Currently in Pain?: Yes Pain Score: 4  Pain Location: Knee Pain Orientation: Left   Exercise/Treatments Stretches Active Hamstring Stretch: 2 reps;30 seconds Active Hamstring Stretch Limitations: 3 way on 14" box Lt only Hip Flexor Stretch: 5 reps;10 seconds;Limitations Hip Flexor Stretch Limitations: 14" box bilateral LE Knee: Self-Stretch to increase Flexion: Limitations Knee: Self-Stretch Limitations: 10x 3" for ROM Gastroc Stretch: 3 reps;30 seconds;Limitations Gastroc Stretch Limitations: slant board Aerobic Tread Mill: 8 minutes at 1.27mph Standing Forward Lunges: Right;10 reps;Limitations Forward Lunges Limitations: 8" step Lateral Step Up: Left;15 reps;Hand Hold: 1;Step Height: 4" Forward Step Up: Left;Hand Hold: 1;10 reps;Step Height: 6" Step Down: Left;10 reps;Hand Hold: 1;Step Height: 4"   Manual Therapy Manual Therapy: Other (comment) Other Manual Therapy: myofascial techniques and massage to Lt knee focusing on lateral aspect  Physical Therapy Assessment and Plan PT Assessment and Plan Clinical Impression Statement: Manual techniques are helping to decrease discomfort around knee.  PT to continue therapy per MD instruction/return to work this week.  Added treadmill today to work on improving gait quality and weight to Lt LE.  PT to ice knee at home today.  Plan: Continue wtih current POC with stretches initially, AROM, and functional  strengtheing.  Continue to encourage even weight distribution.     Problem List Patient Active Problem List   Diagnosis Date Noted  . Acute meniscal tear of left knee 12/23/2013  . Bursitis of left knee 11/03/2013  . Insomnia 10/29/2013  . Sciatica of left side 10/13/2013  . Arthritis of knee, degenerative 10/13/2013  . Effusion of knee joint, left 10/13/2013  . Left knee pain 10/13/2013  . UTI (urinary tract infection) 08/15/2013  . Pain, joint, multiple sites 04/15/2013  . Arthritis 12/02/2012  . Headache 09/25/2012  . Hand dermatitis 03/02/2012  . Essential hypertension, benign 09/21/2011  . Obesity, unspecified 09/21/2011  . Shoulder pain, right 09/21/2011  . Asthma, intermittent 09/21/2011  . Tobacco user 09/21/2011  . TRIGGER FINGER 11/03/2008    PT - End of Session Activity Tolerance: Patient tolerated treatment well General Behavior During Therapy: [facility] for tasks assessed/performed   Teena Irani, PTA/CLT 02/11/2014, 12:04 PM

## 2014-02-13 ENCOUNTER — Ambulatory Visit (HOSPITAL_COMMUNITY)
Admission: RE | Admit: 2014-02-13 | Discharge: 2014-02-13 | Disposition: A | Discharge status: Home / Self Care | Payer: BC Managed Care – PPO | Source: Ambulatory Visit | Attending: Orthopedic Surgery | Admitting: Orthopedic Surgery

## 2014-02-13 NOTE — Progress Notes (Signed)
Physical Therapy Treatment Patient Details  Name: Robin Sherman MRN: [id number] Date of Birth: Jun 10, 1961  Today's Date: 02/13/2014 Time: 1110-1215 PT Time Calculation (min): 65 min Charge: TE 1110-1200, Manual 1200-1215  Visit#: 7 of 16  Re-eval: 02/20/14 Assessment Diagnosis: Lt knee stiffness s/p Lt knee arthroscopy.  Surgical Date: 01/14/14 Next MD Visit: Dr. Aline Brochure 03/09/2014 Prior Therapy: no  Authorization: BCBS  Authorization Time Period:    Authorization Visit#: 7 of 30   Subjective: Symptoms/Limitations Symptoms: Pt stated knee feels good today except for going up stairs c/o increase pain scale 3/10 ascending stairs with knee popping.  Pt excited about return to work next Tuesday 02/17/2014. Pain Assessment Currently in Pain?: Yes Pain Score: 3  Pain Location: Knee Pain Orientation: Left  Precautions/Restrictions  Restrictions Weight Bearing Restrictions: Yes LLE Weight Bearing: Weight bearing as tolerated  Exercise/Treatments Stretches Active Hamstring Stretch: 3 reps;30 seconds;Limitations Active Hamstring Stretch Limitations: 3 way on 14" box Lt only Knee: Self-Stretch to increase Flexion: Limitations Knee: Self-Stretch Limitations: 10x 3" for ROM Gastroc Stretch: 3 reps;30 seconds;Limitations Gastroc Stretch Limitations: slant board Aerobic Tread Mill: 10 minutes at 1.8 mph Standing Forward Lunges: Left;10 reps;Limitations Forward Lunges Limitations: 6"step Side Lunges: Left;10 reps;Limitations Side Lunges Limitations: 6" step Lateral Step Up: Left;15 reps;Hand Hold: 1;Step Height: 4" Forward Step Up: Left;Hand Hold: 1;10 reps;Step Height: 6" Step Down: Left;10 reps;Hand Hold: 1;Step Height: 4" Gait Training: Gait training for heel to toe, eaual stance and stride length with no AD Other Standing Knee Exercises: 3D hip excursion 10x    Manual Therapy Manual Therapy: Joint mobilization Joint Mobilization: [place] mobs all directions instructed to  pt to improve tracking to reduce "popping" with stairs Other Manual Therapy: MFR focus on medial scar tissue with STM to Lt knee   Physical Therapy Assessment and Plan PT Assessment and Plan Clinical Impression Statement: Session focus on improving weight distribution with gait mechanics no AD to improve gait mechanics.  Pt instructed patalla mobs to reduce knee "popping" while ascending stairs, no c/o "popping" during stair training following.  Manual techniques complete at end of session to reduce fascial restrictions over medial scar tissue.  Pt reported pain free at end  of session with improved gait mechanics noted.   PT Plan: Continue wtih current POC with stretches initially, AROM, and functional strengtheing.  Continue to encourage even weight distribution.    Goals PT Short Term Goals PT Short Term Goal 1: Patient will demonstrate improved knee extension to 0 degrees to normalize gait PT Short Term Goal 1 - Progress: Progressing toward goal PT Short Term Goal 2: Patient will demosntrate improved knee flexion to 120 degrres to allow patient to sit to a chair with weightbearign through [place] throughout descent.  PT Short Term Goal 2 - Progress: Progressing toward goal PT Short Term Goal 3: Patient will increased Lt knee flexion/extension strength to 4/5 MMT to be able to perform sit to stand to and from chair 5x in <15 seconds indicating patient not at a high falls risk.  PT Short Term Goal 3 - Progress: Progressing toward goal PT Short Term Goal 4: Patient will be able to ambulate 51minutes with pain less than 3/10 to be able to perform short bouts of walking for exercise.  PT Short Term Goal 4 - Progress: Progressing toward goal PT Long Term Goals PT Long Term Goal 1: Patient will be able to ambulate >37minutes minutes with pain less than 2/10 to be able to go grocery shopping  PT  Long Term Goal 2: Patient will increased Lt knee flexion/extension strength to 5/5 MMT to be able toambulate  up and down stairs without handrail assist  PT Long Term Goal 2 - Progress: Progressing toward goal Long Term Goal 3: Patient will demonstrate glut max/med strength of 4/5 MMT to be bale to perform single leg stand on Lt for >10seconds Long Term Goal 3 Progress: Progressing toward goal  Problem List Patient Active Problem List   Diagnosis Date Noted  . Acute meniscal tear of left knee 12/23/2013  . Bursitis of left knee 11/03/2013  . Insomnia 10/29/2013  . Sciatica of left side 10/13/2013  . Arthritis of knee, degenerative 10/13/2013  . Effusion of knee joint, left 10/13/2013  . Left knee pain 10/13/2013  . UTI (urinary tract infection) 08/15/2013  . Pain, joint, multiple sites 04/15/2013  . Arthritis 12/02/2012  . Headache 09/25/2012  . Hand dermatitis 03/02/2012  . Essential hypertension, benign 09/21/2011  . Obesity, unspecified 09/21/2011  . Shoulder pain, right 09/21/2011  . Asthma, intermittent 09/21/2011  . Tobacco user 09/21/2011  . TRIGGER FINGER 11/03/2008    PT - End of Session Activity Tolerance: Patient tolerated treatment well General Behavior During Therapy: [facility] for tasks assessed/performed  GP    Aldona Lento 02/13/2014, 12:19 PM

## 2014-02-16 ENCOUNTER — Ambulatory Visit (HOSPITAL_COMMUNITY)
Admission: RE | Admit: 2014-02-16 | Discharge: 2014-02-16 | Disposition: A | Discharge status: Home / Self Care | Payer: BC Managed Care – PPO | Source: Ambulatory Visit | Attending: Orthopedic Surgery | Admitting: Orthopedic Surgery

## 2014-02-16 DIAGNOSIS — M25569 Pain in unspecified knee: Secondary | ICD-10-CM | POA: Diagnosis not present

## 2014-02-16 DIAGNOSIS — IMO0001 Reserved for inherently not codable concepts without codable children: Secondary | ICD-10-CM | POA: Insufficient documentation

## 2014-02-16 DIAGNOSIS — R262 Difficulty in walking, not elsewhere classified: Secondary | ICD-10-CM | POA: Insufficient documentation

## 2014-02-16 DIAGNOSIS — I10 Essential (primary) hypertension: Secondary | ICD-10-CM | POA: Diagnosis not present

## 2014-02-16 DIAGNOSIS — E669 Obesity, unspecified: Secondary | ICD-10-CM | POA: Diagnosis not present

## 2014-02-16 DIAGNOSIS — M25669 Stiffness of unspecified knee, not elsewhere classified: Secondary | ICD-10-CM | POA: Insufficient documentation

## 2014-02-16 NOTE — Progress Notes (Signed)
Physical Therapy Treatment Patient Details  Name: Robin Sherman MRN: [id number] Date of Birth: [birth year]  Today's Date: 02/16/2014 Time: 1021-1108 PT Time Calculation (min): 47 min 47' TE  Visit#: 8 of 16  Re-eval: 02/20/14    Authorization: BCBS  Authorization Time Period:    Authorization Visit#:  8 of  30   Subjective: Symptoms/Limitations Symptoms: no complaints today   Exercise/Treatments    Stretches Active Hamstring Stretch: 3 reps;30 seconds;Limitations Active Hamstring Stretch Limitations: 3 way on 14" box Lt only Knee: Self-Stretch to increase Flexion: Limitations Knee: Self-Stretch Limitations: 10x 30" for ROM Gastroc Stretch: 3 reps;30 seconds;Limitations Gastroc Stretch Limitations: slant board Aerobic Tread Mill: 10 minutes at 1.8 mph; using gait training mode Standing Forward Lunges: Left;10 reps;Limitations Forward Lunges Limitations: 6"step Side Lunges: Left;10 reps;Limitations Side Lunges Limitations: 6" step Lateral Step Up: 10 reps;Left;Hand Hold: 0;Hand Hold: 1;Step Height: 6" Forward Step Up: Left;Hand Hold: 1;10 reps;Step Height: 6" Step Down: Left;10 reps;Hand Hold: 1;Step Height: 4" Other Standing Knee Exercises: 3D hip excursion 10x       Physical Therapy Assessment and Plan PT Assessment and Plan Clinical Impression Statement: Gait traing program initiaing on treadmill to improve even weight distribution. No complaints of pain today therefore focus of treatment was strengthening. Visual and tactile cueing required 50% of the time for correction of proper technique;continue increasing strength/mobility as pain allows PT Plan: Continue wtih current POC with stretches initially, AROM, and functional strengtheing.  Continue to encourage even weight distribution:continue with PT POC    Goals    Problem List Patient Active Problem List   Diagnosis Date Noted  . Acute meniscal tear of left knee 12/23/2013  . Bursitis of left knee  11/03/2013  . Insomnia 10/29/2013  . Sciatica of left side 10/13/2013  . Arthritis of knee, degenerative 10/13/2013  . Effusion of knee joint, left 10/13/2013  . Left knee pain 10/13/2013  . UTI (urinary tract infection) 08/15/2013  . Pain, joint, multiple sites 04/15/2013  . Arthritis 12/02/2012  . Headache 09/25/2012  . Hand dermatitis 03/02/2012  . Essential hypertension, benign 09/21/2011  . Obesity, unspecified 09/21/2011  . Shoulder pain, right 09/21/2011  . Asthma, intermittent 09/21/2011  . Tobacco user 09/21/2011  . TRIGGER FINGER 11/03/2008       GP    Rigby Swamy, [place] 02/16/2014, 11:27 AM

## 2014-02-18 ENCOUNTER — Ambulatory Visit (HOSPITAL_COMMUNITY)
Admission: RE | Admit: 2014-02-18 | Discharge: 2014-02-18 | Disposition: A | Discharge status: Home / Self Care | Payer: BC Managed Care – PPO | Source: Ambulatory Visit | Attending: Orthopedic Surgery | Admitting: Orthopedic Surgery

## 2014-02-18 DIAGNOSIS — IMO0001 Reserved for inherently not codable concepts without codable children: Secondary | ICD-10-CM | POA: Diagnosis not present

## 2014-02-18 NOTE — Progress Notes (Signed)
Physical Therapy Treatment Patient Details  Name: Robin Sherman MRN: [id number] Date of Birth: [birth year]  Today's Date: 02/18/2014 Time: 1023-1050 PT Time Calculation (min): 27 min Charge: TE 1023-1050  Visit#: 9 of 16  Re-eval: 02/20/14 Assessment Diagnosis: Lt knee stiffness s/p Lt knee arthroscopy.  Surgical Date: 01/14/14 Next MD Visit: Dr. Aline Brochure 03/09/2014 Prior Therapy: no  Authorization: BCBS  Authorization Time Period:    Authorization Visit#: 9 of 30   Subjective: Symptoms/Limitations Symptoms: Pt stated Lt knee hurting this morning, returned to work yesterday for 12 hours, pain scale 8/10.  Pt very tired today Pain Assessment Currently in Pain?: Yes Pain Score: 8  Pain Location: Knee Pain Orientation: Left  Precautions/Restrictions  Restrictions Weight Bearing Restrictions: Yes LLE Weight Bearing: Weight bearing as tolerated  Exercise/Treatments Stretches Active Hamstring Stretch: 3 reps;30 seconds;Limitations Active Hamstring Stretch Limitations: 3 way on 14" box Lt only Quad Stretch: 2 reps;30 seconds Knee: Self-Stretch to increase Flexion: Limitations Knee: Self-Stretch Limitations: 10x 3" for ROM Gastroc Stretch: 3 reps;30 seconds;Limitations Gastroc Stretch Limitations: slant board Aerobic Stationary Bike: 8 minutes seat 7;  full revolutions initial session as warm up Standing Rocker Board: 2 minutes;Limitations Rocker Board Limitations: R/L and A/P Other Standing Knee Exercises: 3D hip excursion 10x     Physical Therapy Assessment and Plan PT Assessment and Plan Clinical Impression Statement: Session focus on improving mobility and improving gait mechanics.  Pt very tired through session following return to work 12 hour shift last night.  Began session with bike for warm up, stretches to reduce tightness and mobiltiy exercises to improve gait mechanics.  Ice offered at end of session for pain control, pt stated she would apply at home.  Pain  reduced to 4-5/10 and improved gait mechanics.   PT Plan: Re-eval next session.  Continue wtih current POC with stretches initially, AROM, and functional strengtheing.  Continue to encourage even weight distribution:continue with PT POC    Goals PT Short Term Goals PT Short Term Goal 1: Patient will demonstrate improved knee extension to 0 degrees to normalize gait PT Short Term Goal 1 - Progress: Progressing toward goal PT Short Term Goal 2: Patient will demosntrate improved knee flexion to 120 degrres to allow patient to sit to a chair with weightbearign through [place] throughout descent.  PT Short Term Goal 2 - Progress: Progressing toward goal PT Short Term Goal 3: Patient will increased Lt knee flexion/extension strength to 4/5 MMT to be able to perform sit to stand to and from chair 5x in <15 seconds indicating patient not at a high falls risk.  PT Short Term Goal 3 - Progress: Progressing toward goal PT Short Term Goal 4: Patient will be able to ambulate 65minutes with pain less than 3/10 to be able to perform short bouts of walking for exercise.  PT Short Term Goal 4 - Progress: Progressing toward goal PT Long Term Goals PT Long Term Goal 1: Patient will be able to ambulate >67minutes minutes with pain less than 2/10 to be able to go grocery shopping  PT Long Term Goal 2: Patient will increased Lt knee flexion/extension strength to 5/5 MMT to be able toambulate up and down stairs without handrail assist  Long Term Goal 3: Patient will demonstrate glut max/med strength of 4/5 MMT to be bale to perform single leg stand on Lt for >10seconds  Problem List Patient Active Problem List   Diagnosis Date Noted  . Acute meniscal tear of left knee 12/23/2013  . Bursitis  of left knee 11/03/2013  . Insomnia 10/29/2013  . Sciatica of left side 10/13/2013  . Arthritis of knee, degenerative 10/13/2013  . Effusion of knee joint, left 10/13/2013  . Left knee pain 10/13/2013  . UTI (urinary tract  infection) 08/15/2013  . Pain, joint, multiple sites 04/15/2013  . Arthritis 12/02/2012  . Headache 09/25/2012  . Hand dermatitis 03/02/2012  . Essential hypertension, benign 09/21/2011  . Obesity, unspecified 09/21/2011  . Shoulder pain, right 09/21/2011  . Asthma, intermittent 09/21/2011  . Tobacco user 09/21/2011  . TRIGGER FINGER 11/03/2008    PT - End of Session Activity Tolerance: Patient tolerated treatment well General Behavior During Therapy: [facility] for tasks assessed/performed  GP    Aldona Lento 02/18/2014, 10:57 AM

## 2014-02-24 ENCOUNTER — Ambulatory Visit (HOSPITAL_COMMUNITY)
Admission: RE | Admit: 2014-02-24 | Discharge: 2014-02-24 | Disposition: A | Discharge status: Home / Self Care | Payer: BC Managed Care – PPO | Source: Ambulatory Visit | Attending: Family Medicine | Admitting: Family Medicine

## 2014-02-24 DIAGNOSIS — R928 Other abnormal and inconclusive findings on diagnostic imaging of breast: Secondary | ICD-10-CM | POA: Insufficient documentation

## 2014-02-26 ENCOUNTER — Ambulatory Visit (HOSPITAL_COMMUNITY)
Admission: RE | Admit: 2014-02-26 | Discharge: 2014-02-26 | Disposition: A | Discharge status: Home / Self Care | Payer: BC Managed Care – PPO | Source: Ambulatory Visit | Attending: Orthopedic Surgery | Admitting: Orthopedic Surgery

## 2014-02-26 DIAGNOSIS — IMO0001 Reserved for inherently not codable concepts without codable children: Secondary | ICD-10-CM | POA: Diagnosis not present

## 2014-02-26 NOTE — Progress Notes (Signed)
Physical Therapy Re-evaluation/Treatment Note  Patient Details  Name: Robin Sherman MRN: [id number] Date of Birth: 20-Apr-1961  Today's Date: 02/26/2014 Time: 9379-0240 PT Time Calculation (min): 49 min Charge: TE 9735-3299, MMT/ROM Measurement [phone], FOTO no charge, Ice [id number]              Visit#: 10 of 16  Re-eval: 02/20/14 Assessment Diagnosis: Lt knee stiffness s/p Lt knee arthroscopy.  Surgical Date: 01/14/14 Next MD Visit: Dr. Aline Brochure 03/09/2014 Prior Therapy: no  Authorization: BCBS    Authorization Time Period:    Authorization Visit#: 10 of 20   Subjective Symptoms/Limitations Symptoms: Pain scale 6/10 no medication, feels tight today pt stood for 6-7 hours wtih work for 12 hours How long can you stand comfortably?: 45 minutes (<26mnutes) How long can you walk comfortably?: 5-10 minutes (<3 minutes in home and to car) Pain Assessment Currently in Pain?: Yes Pain Score: 6  Pain Location: Knee Pain Orientation: Left  Precautions/Restrictions  Restrictions Weight Bearing Restrictions: Yes LLE Weight Bearing: Weight bearing as tolerated  Cognition/Observation Observation/Other Assessments Other Assessments: FOTO: 44% status, Limitation 56% status (FOTO: 44% status, Limitation 56% status)  Sensation/Coordination/Flexibility/Functional Tests Flexibility 90/90: Positive Functional Tests Functional Tests: Limited piriformis mobility   Assessment LLE AROM (degrees) Left Knee Extension: 0 (was -5) Left Knee Flexion: 123 (was 110) LLE Strength Left Hip Flexion: 4/5 (was 4/5) Left Hip Extension: 4/5 Left Hip ABduction:  (4+/5) Left Knee Flexion: 4/5 (was 3+/5) Left Knee Extension: 4/5 (was 3+/5) Left Ankle Dorsiflexion: 5/5 (was 5/5)  Exercise/Treatments Stretches Active Hamstring Stretch: 3 reps;30 seconds;Limitations Active Hamstring Stretch Limitations: 3 way on 14" box Lt only Quad Stretch: 2 reps;30 seconds Knee: Self-Stretch to increase  Flexion: Limitations Knee: Self-Stretch Limitations: 10x 3" knee drive 3 directions for ROM Gastroc Stretch: 3 reps;30 seconds;Limitations Gastroc Stretch Limitations: slant board Aerobic Stationary Bike: 8 minutes seat 7;  full revolutions initial session as warm up Standing Other Standing Knee Exercises: 3D hip excursion 10x  Supine Quad Sets: Left;10 reps;AROM Heel Slides: 10 reps;Left   Modalities Modalities: Cryotherapy Cryotherapy Number Minutes Cryotherapy: 10 Minutes Cryotherapy Location: Knee Type of Cryotherapy: Ice pack  Physical Therapy Assessment and Plan PT Assessment and Plan Clinical Impression Statement: Reassessment complete with the following findings:  Pt able to demonstrate and verbalize appropriate technique with HEP.  AROM improved to 0-123 degrees.  Strength is progressing well.  Pt reports ability to ambulate for 5-10 minutes prior increased pain.   PT Plan: Recommend continuing OPPT for 4 more weeks to improve functional strengthening and improve weight distribution to improve gait mechanics.    Goals Home Exercise Program PT Goal: Perform Home Exercise Program - Progress: Met PT Short Term Goals PT Short Term Goal 1: Patient will demonstrate improved knee extension to 0 degrees to normalize gait PT Short Term Goal 1 - Progress: Met PT Short Term Goal 2: Patient will demosntrate improved knee flexion to 120 degrres to allow patient to sit to a chair [place].  PT Short Term Goal 2 - Progress: Met PT Short Term Goal 3: Patient will increased Lt knee flexion/extension strength to 4/5 MMT to be able to perform sit to stand to and from chair 5x in <15 seconds indicating patient not at a high falls risk.  PT Short Term Goal 3 - Progress: Met PT Short Term Goal 4: Patient will be able to ambulate 1100mutes with pain less than 3/10 to be able to perform short bouts of walking  for exercise.  PT Short Term Goal 4 -  Progress: Progressing toward goal PT Long Term Goals PT Long Term Goal 1: Patient will be able to ambulate >65mnutes minutes with pain less than 2/10 to be able to go grocery shopping  PT Long Term Goal 1 - Progress: Not met (5-10 minutes) PT Long Term Goal 2: Patient will increased Lt knee flexion/extension strength to 5/5 MMT to be able toambulate up and down stairs without handrail assist  PT Long Term Goal 2 - Progress: Progressing toward goal Long Term Goal 3: Patient will demonstrate glut max/med strength of 4/5 MMT to be bale to perform single leg stand on Lt for >10seconds Long Term Goal 3 Progress: Progressing toward goal  Problem List Patient Active Problem List   Diagnosis Date Noted  . Acute meniscal tear of left knee 12/23/2013  . Bursitis of left knee 11/03/2013  . Insomnia 10/29/2013  . Sciatica of left side 10/13/2013  . Arthritis of knee, degenerative 10/13/2013  . Effusion of knee joint, left 10/13/2013  . Left knee pain 10/13/2013  . UTI (urinary tract infection) 08/15/2013  . Pain, joint, multiple sites 04/15/2013  . Arthritis 12/02/2012  . Headache 09/25/2012  . Hand dermatitis 03/02/2012  . Essential hypertension, benign 09/21/2011  . Obesity, unspecified 09/21/2011  . Shoulder pain, right 09/21/2011  . Asthma, intermittent 09/21/2011  . Tobacco user 09/21/2011  . TRIGGER FINGER 11/03/2008    PT - End of Session Activity Tolerance: Patient tolerated treatment well General Behavior During Therapy: [facility] assessed/performed  GP    CAldona Lento8/13/2015, 10:12 AM  Physician Documentation Your signature is required to indicate approval of the treatment plan as stated above.  Please sign and either send electronically or make a copy of this report for your files and return this physician signed original.   Please mark one 1.__approve of plan  2. ___approve of plan with the following conditions.   ______________________________                                                           _____________________ Physician Signature                                                                                                             Date

## 2014-02-27 NOTE — Progress Notes (Signed)
Physical Therapy Re-evaluation/Treatment Note  Patient Details  Name: Robin Sherman MRN: [id number] Date of Birth: 30-Nov-1960  Today's Date: 02/26/2014 Time: 9379-0240 PT Time Calculation (min): 49 min Charge: TE 9735-3299, MMT/ROM Measurement [phone], FOTO no charge, Ice [id number]              Visit#: 10 of 16  Re-eval: 02/20/14 Assessment Diagnosis: Lt knee stiffness s/p Lt knee arthroscopy.  Surgical Date: 01/14/14 Next MD Visit: Dr. Aline Brochure 03/09/2014 Prior Therapy: no  Authorization: BCBS    Authorization Time Period:    Authorization Visit#: 10 of 20   Subjective Symptoms/Limitations Symptoms: Pain scale 6/10 no medication, feels tight today pt stood for 6-7 hours wtih work for 12 hours How long can you stand comfortably?: 45 minutes (<36mnutes) How long can you walk comfortably?: 5-10 minutes (<3 minutes in home and to car) Pain Assessment Currently in Pain?: Yes Pain Score: 6  Pain Location: Knee Pain Orientation: Left  Precautions/Restrictions  Restrictions Weight Bearing Restrictions: Yes LLE Weight Bearing: Weight bearing as tolerated  Cognition/Observation Observation/Other Assessments Other Assessments: FOTO: 44% status, Limitation 56% status (FOTO: 44% status, Limitation 56% status)  Sensation/Coordination/Flexibility/Functional Tests Flexibility 90/90: Positive Functional Tests Functional Tests: Limited piriformis mobility   Assessment LLE AROM (degrees) Left Knee Extension: 0 (was -5) Left Knee Flexion: 123 (was 110) LLE Strength Left Hip Flexion: 4/5 (was 4/5) Left Hip Extension: 4/5 Left Hip ABduction:  (4+/5) Left Knee Flexion: 4/5 (was 3+/5) Left Knee Extension: 4/5 (was 3+/5) Left Ankle Dorsiflexion: 5/5 (was 5/5)  Exercise/Treatments Stretches Active Hamstring Stretch: 3 reps;30 seconds;Limitations Active Hamstring Stretch Limitations: 3 way on 14" box Lt only Quad Stretch: 2 reps;30 seconds Knee: Self-Stretch to increase  Flexion: Limitations Knee: Self-Stretch Limitations: 10x 3" knee drive 3 directions for ROM Gastroc Stretch: 3 reps;30 seconds;Limitations Gastroc Stretch Limitations: slant board Aerobic Stationary Bike: 8 minutes seat 7;  full revolutions initial session as warm up Standing Other Standing Knee Exercises: 3D hip excursion 10x  Supine Quad Sets: Left;10 reps;AROM Heel Slides: 10 reps;Left   Modalities Modalities: Cryotherapy Cryotherapy Number Minutes Cryotherapy: 10 Minutes Cryotherapy Location: Knee Type of Cryotherapy: Ice pack  Physical Therapy Assessment and Plan PT Assessment and Plan Clinical Impression Statement: Reassessment complete with the following findings:  Pt able to demonstrate and verbalize appropriate technique with HEP.  AROM improved to 0-123 degrees.  Strength is progressing well.  Pt reports ability to ambulate for 5-10 minutes prior increased pain.   PT Plan: Recommend continuing OPPT for 4 more weeks to improve functional strengthening and improve weight distribution to improve gait mechanics.    Goals Home Exercise Program PT Goal: Perform Home Exercise Program - Progress: Met PT Short Term Goals PT Short Term Goal 1: Patient will demonstrate improved knee extension to 0 degrees to normalize gait PT Short Term Goal 1 - Progress: Met PT Short Term Goal 2: Patient will demosntrate improved knee flexion to 120 degrres to allow patient to sit to a chair with [place]bearign [place].  PT Short Term Goal 2 - Progress: Met PT Short Term Goal 3: Patient will increased Lt knee flexion/extension strength to 4/5 MMT to be able to perform sit to stand to and from chair 5x in <15 seconds indicating patient not at a high falls risk.  PT Short Term Goal 3 - Progress: Met PT Short Term Goal 4: Patient will be able to ambulate 119mutes with pain less than 3/10 to be able to perform short bouts of walking  for exercise.  PT Short Term Goal 4 -  Progress: Progressing toward goal PT Long Term Goals PT Long Term Goal 1: Patient will be able to ambulate >42minutes minutes with pain less than 2/10 to be able to go grocery shopping  PT Long Term Goal 1 - Progress: Not met (5-10 minutes) PT Long Term Goal 2: Patient will increased Lt knee flexion/extension strength to 5/5 MMT to be able toambulate up and down stairs without handrail assist  PT Long Term Goal 2 - Progress: Progressing toward goal Long Term Goal 3: Patient will demonstrate glut max/med strength of 4/5 MMT to be bale to perform single leg stand on Lt for >10seconds Long Term Goal 3 Progress: Progressing toward goal  Problem List Patient Active Problem List   Diagnosis Date Noted  . Acute meniscal tear of left knee 12/23/2013  . Bursitis of left knee 11/03/2013  . Insomnia 10/29/2013  . Sciatica of left side 10/13/2013  . Arthritis of knee, degenerative 10/13/2013  . Effusion of knee joint, left 10/13/2013  . Left knee pain 10/13/2013  . UTI (urinary tract infection) 08/15/2013  . Pain, joint, multiple sites 04/15/2013  . Arthritis 12/02/2012  . Headache 09/25/2012  . Hand dermatitis 03/02/2012  . Essential hypertension, benign 09/21/2011  . Obesity, unspecified 09/21/2011  . Shoulder pain, right 09/21/2011  . Asthma, intermittent 09/21/2011  . Tobacco user 09/21/2011  . TRIGGER FINGER 11/03/2008    PT - End of Session Activity Tolerance: Patient tolerated treatment well General Behavior During Therapy: WFL for tasks assessed/performed  GP    Aldona Lento 02/26/2014, 10:12 AM  Devona Konig PT DPT  Physician Documentation Your signature is required to indicate approval of the treatment plan as stated above.  Please sign and either send electronically or make a copy of this report for your files and return this physician signed original.   Please mark one 1.__approve of plan  2. ___approve of plan with the following  conditions.   ______________________________                                                          _____________________ Physician Signature                                                                                                             Date

## 2014-03-04 ENCOUNTER — Ambulatory Visit (HOSPITAL_COMMUNITY)
Admission: RE | Admit: 2014-03-04 | Discharge: 2014-03-04 | Disposition: A | Discharge status: Home / Self Care | Payer: BC Managed Care – PPO | Source: Ambulatory Visit | Attending: Orthopedic Surgery | Admitting: Orthopedic Surgery

## 2014-03-04 DIAGNOSIS — IMO0001 Reserved for inherently not codable concepts without codable children: Secondary | ICD-10-CM | POA: Diagnosis not present

## 2014-03-04 NOTE — Progress Notes (Signed)
Physical Therapy Treatment Patient Details  Name: Robin Sherman MRN: [id number] Date of Birth: 1961-05-16  Today's Date: 03/04/2014 Time: 1345-1430 PT Time Calculation (min): 45 min  Visit#: 11 of 16  Re-eval: 02/20/14 Authorization: BCBS not medicare  Charges:  therex 45  Subjective: Symptoms/Limitations Symptoms: Pt states she has the most pain with prolonged standing.  Currently with minimal discomfort of 5/10 Pain Assessment Currently in Pain?: Yes Pain Score: 5  Pain Location: Knee Pain Orientation: Left   Exercise/Treatments Stretches Active Hamstring Stretch: 3 reps;30 seconds;Limitations Active Hamstring Stretch Limitations: 3 way on 14" box Lt only Knee: Self-Stretch to increase Flexion: Limitations Knee: Self-Stretch Limitations: 10x 3" knee drive 3 directions for ROM Gastroc Stretch: 3 reps;30 seconds;Limitations Gastroc Stretch Limitations: slant board Aerobic Stationary Bike: 8 minutes seat 7;  full revolutions initial session as warm up Standing Lateral Step Up: Left;10 reps;Step Height: 4";Hand Hold: 0 Forward Step Up: Left;10 reps;Step Height: 4";Hand Hold: 0 Step Down: Left;10 reps;Step Height: 4";Hand Hold: 1 SLS: 5x max 12" LT LE SLS with Vectors: 10 reps Lt toe tap 3 3 ways     Physical Therapy Assessment and Plan Clinical Impression Statement: Focused on activities with Lt LE single leg stance to increase stability and strength.  Noted weakness with increased discomfort in central knee with these activities, however no increased pain at end of session.   PT Plan: Recommend continuing OPPT for 4 more weeks to improve functional strengthening and improve weight distribution to improve gait mechanics.     Problem List Patient Active Problem List   Diagnosis Date Noted  . Acute meniscal tear of left knee 12/23/2013  . Bursitis of left knee 11/03/2013  . Insomnia 10/29/2013  . Sciatica of left side 10/13/2013  . Arthritis of knee, degenerative  10/13/2013  . Effusion of knee joint, left 10/13/2013  . Left knee pain 10/13/2013  . UTI (urinary tract infection) 08/15/2013  . Pain, joint, multiple sites 04/15/2013  . Arthritis 12/02/2012  . Headache 09/25/2012  . Hand dermatitis 03/02/2012  . Essential hypertension, benign 09/21/2011  . Obesity, unspecified 09/21/2011  . Shoulder pain, right 09/21/2011  . Asthma, intermittent 09/21/2011  . Tobacco user 09/21/2011  . TRIGGER FINGER 11/03/2008    Teena Irani, PTA/CLT 03/04/2014, 3:48 PM

## 2014-03-05 ENCOUNTER — Telehealth (HOSPITAL_COMMUNITY): Payer: Self-pay

## 2014-03-05 ENCOUNTER — Ambulatory Visit (HOSPITAL_COMMUNITY): Payer: BC Managed Care – PPO | Admitting: Physical Therapy

## 2014-03-09 ENCOUNTER — Encounter: Payer: Self-pay | Admitting: Orthopedic Surgery

## 2014-03-09 ENCOUNTER — Ambulatory Visit (INDEPENDENT_AMBULATORY_CARE_PROVIDER_SITE_OTHER): Payer: BC Managed Care – PPO | Admitting: Orthopedic Surgery

## 2014-03-09 DIAGNOSIS — Z9889 Other specified postprocedural states: Secondary | ICD-10-CM

## 2014-03-09 DIAGNOSIS — M23329 Other meniscus derangements, posterior horn of medial meniscus, unspecified knee: Secondary | ICD-10-CM

## 2014-03-09 DIAGNOSIS — M1710 Unilateral primary osteoarthritis, unspecified knee: Secondary | ICD-10-CM

## 2014-03-09 DIAGNOSIS — M1712 Unilateral primary osteoarthritis, left knee: Secondary | ICD-10-CM

## 2014-03-09 DIAGNOSIS — M23322 Other meniscus derangements, posterior horn of medial meniscus, left knee: Secondary | ICD-10-CM

## 2014-03-09 MED ORDER — OXYCODONE-ACETAMINOPHEN 5-325 MG PO TABS: 1.0000 | ORAL_TABLET | Freq: Four times a day (QID) | ORAL | Status: DC | PRN

## 2014-03-09 NOTE — Progress Notes (Signed)
Follow-up      3 week recheck left knee, DOS 01/14/14  PRE-OPERATIVE DIAGNOSIS:  left medial meniscal tear  POST-OPERATIVE DIAGNOSIS:  left medial meniscal tear, osteoarthritis  PROCEDURE:  Procedure(s): KNEE ARTHROSCOPY WITH PARTIAL MEDIAL MENISECTOMY (Left) CHONDROPLASTY OF FEMUR (Left)  Operative findings the medial meniscus was torn at the root of the posterior horn. There was an oblique tear going right back to the red red zone. She also had grade 2 chondromalacia of the medial femoral condyle.  Lateral compartment normal trochlear patella compartment normal notch normal  She is still using her cane which is a little unusual she went back to work her choice because of financial restrictions although she consented down at work. He would be better for her to stay on until she was really ready to go back. She complains of pain with weightbearing medial and over the medial portal  She has full extension and flexion of the knee at this point no effusion. Recommend continue therapy continue current medication she is allergic to codeine follow up in 4 weeks encouraged to stop using the cane

## 2014-03-09 NOTE — Patient Instructions (Signed)
Continue therapy

## 2014-03-10 ENCOUNTER — Ambulatory Visit (HOSPITAL_COMMUNITY)
Admission: RE | Admit: 2014-03-10 | Discharge: 2014-03-10 | Disposition: A | Discharge status: Home / Self Care | Payer: BC Managed Care – PPO | Source: Ambulatory Visit | Attending: Orthopedic Surgery | Admitting: Orthopedic Surgery

## 2014-03-10 DIAGNOSIS — IMO0001 Reserved for inherently not codable concepts without codable children: Secondary | ICD-10-CM | POA: Diagnosis not present

## 2014-03-10 NOTE — Progress Notes (Signed)
Physical Therapy Treatment Patient Details  Name: Robin Sherman MRN: [id number] Date of Birth: [birth year]  Today's Date: 03/10/2014 Time: 1345-1430 PT Time Calculation (min): 45 min Visit#: 12 of 22  Re-eval: 03/26/14 Authorization: BCBS not medicare  Charges:  therex 45  Subjective: Symptoms/Limitations Symptoms: Pt states she is still having increased pain with full weight bearing through the Lt LE, otherwise her pain is 6/10 today in her Lt knee. Pain Assessment Currently in Pain?: Yes Pain Score: 6    Exercise/Treatments Stretches Active Hamstring Stretch: 3 reps;30 seconds;Limitations Active Hamstring Stretch Limitations: 3 way on 14" box Lt only Gastroc Stretch: 3 reps;30 seconds;Limitations Gastroc Stretch Limitations: slant board Aerobic Stationary Bike: 8 minutes seat 7;  full revolutions initial session as warm up Standing Forward Lunges: Left;10 reps;Limitations Forward Lunges Limitations: onto 4" step UE assist for balance Side Lunges: Left;10 reps;Limitations Side Lunges Limitations: onto 4" step no UE assist Lateral Step Up: Left;10 reps;Step Height: 4";Hand Hold: 0 Forward Step Up: Left;10 reps;Step Height: 4";Hand Hold: 0 Step Down: Left;10 reps;Step Height: 4";Hand Hold: 1 Stairs: 3RT reciprocally with 1 HR Rocker Board: 2 minutes;Limitations Rocker Board Limitations: R/L and A/P SLS: 5x max 16" LT LE SLS with Vectors: 10 reps Lt toe tap 3 3 ways    Physical Therapy Assessment and Plan PT Assessment and Plan Clinical Impression Statement: Continued with actvities to encourage LT LE weight bearing tolerance to build stabilitya nd strength.  Completed staircase reciprocally, however patient reluctant with step-to pattern.  Pt encouraged to continue practice ot reciprocal steps at home.  Able to increase LT SLS time to 16 seconds today.  Added forward and lateral lunges with 4" box to increase stability.   PT Plan: Continue for 2 more weeks to improve  functional strengthening and increased weight distribution to improve gait mechanics.     Problem List Patient Active Problem List   Diagnosis Date Noted  . Acute meniscal tear of left knee 12/23/2013  . Bursitis of left knee 11/03/2013  . Insomnia 10/29/2013  . Sciatica of left side 10/13/2013  . Arthritis of knee, degenerative 10/13/2013  . Effusion of knee joint, left 10/13/2013  . Left knee pain 10/13/2013  . UTI (urinary tract infection) 08/15/2013  . Pain, joint, multiple sites 04/15/2013  . Arthritis 12/02/2012  . Headache 09/25/2012  . Hand dermatitis 03/02/2012  . Essential hypertension, benign 09/21/2011  . Obesity, unspecified 09/21/2011  . Shoulder pain, right 09/21/2011  . Asthma, intermittent 09/21/2011  . Tobacco user 09/21/2011  . TRIGGER FINGER 11/03/2008      Teena Irani, PTA/CLT 03/10/2014, 2:56 PM

## 2014-03-13 ENCOUNTER — Ambulatory Visit (HOSPITAL_COMMUNITY)
Admission: RE | Admit: 2014-03-13 | Payer: BC Managed Care – PPO | Source: Ambulatory Visit | Admitting: Physical Therapy

## 2014-03-17 ENCOUNTER — Ambulatory Visit (HOSPITAL_COMMUNITY)
Admission: RE | Admit: 2014-03-17 | Discharge: 2014-03-17 | Disposition: A | Discharge status: Home / Self Care | Payer: BC Managed Care – PPO | Source: Ambulatory Visit | Attending: Family Medicine | Admitting: Family Medicine

## 2014-03-17 DIAGNOSIS — I10 Essential (primary) hypertension: Secondary | ICD-10-CM | POA: Diagnosis not present

## 2014-03-17 DIAGNOSIS — M25669 Stiffness of unspecified knee, not elsewhere classified: Secondary | ICD-10-CM | POA: Diagnosis not present

## 2014-03-17 DIAGNOSIS — IMO0001 Reserved for inherently not codable concepts without codable children: Secondary | ICD-10-CM | POA: Insufficient documentation

## 2014-03-17 DIAGNOSIS — M25569 Pain in unspecified knee: Secondary | ICD-10-CM | POA: Insufficient documentation

## 2014-03-17 DIAGNOSIS — R262 Difficulty in walking, not elsewhere classified: Secondary | ICD-10-CM | POA: Insufficient documentation

## 2014-03-17 DIAGNOSIS — E669 Obesity, unspecified: Secondary | ICD-10-CM | POA: Insufficient documentation

## 2014-03-17 NOTE — Progress Notes (Signed)
Physical Therapy Treatment Patient Details  Name: Robin Sherman MRN: [id number] Date of Birth: 1961-02-04  Today's Date: 03/17/2014 Time: 1962-2297 PT Time Calculation (min): 73 min Charge: TE [id number]  Visit#: 13 of 22  Re-eval: 03/26/14 Assessment Diagnosis: Lt knee stiffness s/p Lt knee arthroscopy.  Surgical Date: 01/14/14 Next MD Visit: Dr. Aline Brochure 04/07/2014 Prior Therapy: no  Authorization: BCBS not medicare  Authorization Time Period:    Authorization Visit#: 11 of 20   Subjective: Symptoms/Limitations Symptoms: Feeling good today, minimum pain scale 2/10 Pain Assessment Currently in Pain?: Yes Pain Score: 2  Pain Location: Knee Pain Orientation: Left  Precautions/Restrictions  Restrictions Weight Bearing Restrictions: Yes LLE Weight Bearing: Weight bearing as tolerated  Exercise/Treatments Stretches Active Hamstring Stretch: 3 reps;30 seconds;Limitations Active Hamstring Stretch Limitations: 3 way on 14" box Lt only Gastroc Stretch: 3 reps;30 seconds;Limitations Gastroc Stretch Limitations: slant board Aerobic Stationary Bike: 8 minutes seat 7;  full revolutions initial session as warm up Standing Forward Lunges: Left;10 reps;Limitations Forward Lunges Limitations: onto 4" step UE assist for balance Side Lunges: Left;10 reps;Limitations Side Lunges Limitations: onto 4" step no UE assist Lateral Step Up: Left;10 reps;Step Height: 4";Hand Hold: 0 Forward Step Up: Left;10 reps;Hand Hold: 0;Step Height: 6" Step Down: Left;10 reps;Step Height: 4";Hand Hold: 1 Functional Squat: Limitations;10 reps Functional Squat Limitations: 3D hip excursion with squat reach matrix 2# Stairs: 3RT reciprocally with 1 HR Rocker Board: 2 minutes;Limitations Rocker Board Limitations: R/L and A/P SLS: Lt 13" max of 3 SLS with Vectors: 10 reps Lt toe tap 3 3 ways    Physical Therapy Assessment and Plan PT Assessment and Plan Clinical Impression Statement: Session focus on  equalizing Lt LE stance with gait and stair strengthening activities.Pt presented with step to pattern, cueing for reciprocal pattern with noted weak eccentric control descending stairs.  Able to increased stair height to 6in with forward step ups with cueing to reduce UE assistance, just for balance control.  Added squat reach matrix to improve gluteal stability.   PT Plan: Continue with current PT POC to improve functional strengthening and increased weight distribution to improve gait mechanics.    Goals PT Short Term Goals PT Short Term Goal 4: Patient will be able to ambulate 89minutes with pain less than 3/10 to be able to perform short bouts of walking for exercise.  PT Short Term Goal 4 - Progress: Progressing toward goal PT Long Term Goals PT Long Term Goal 1: Patient will be able to ambulate >56minutes minutes with pain less than 2/10 to be able to go grocery shopping  PT Long Term Goal 1 - Progress: Progressing toward goal PT Long Term Goal 2: Patient will increased Lt knee flexion/extension strength to 5/5 MMT to be able toambulate up and down stairs without handrail assist  PT Long Term Goal 2 - Progress: Progressing toward goal Long Term Goal 3: Patient will demonstrate glut max/med strength of 4/5 MMT to be bale to perform single leg stand on Lt for >10seconds Long Term Goal 3 Progress: Progressing toward goal  Problem List Patient Active Problem List   Diagnosis Date Noted  . Acute meniscal tear of left knee 12/23/2013  . Bursitis of left knee 11/03/2013  . Insomnia 10/29/2013  . Sciatica of left side 10/13/2013  . Arthritis of knee, degenerative 10/13/2013  . Effusion of knee joint, left 10/13/2013  . Left knee pain 10/13/2013  . UTI (urinary tract infection) 08/15/2013  . Pain, joint, multiple sites 04/15/2013  . Arthritis 12/02/2012  .  Headache 09/25/2012  . Hand dermatitis 03/02/2012  . Essential hypertension, benign 09/21/2011  . Obesity, unspecified 09/21/2011  .  Shoulder pain, right 09/21/2011  . Asthma, intermittent 09/21/2011  . Tobacco user 09/21/2011  . TRIGGER FINGER 11/03/2008    PT - End of Session Activity Tolerance: Patient tolerated treatment well General Behavior During Therapy: [facility] for tasks assessed/performed  GP    Aldona Lento 03/17/2014, 1:16 PM

## 2014-03-19 ENCOUNTER — Ambulatory Visit (HOSPITAL_COMMUNITY)
Admission: RE | Admit: 2014-03-19 | Discharge: 2014-03-19 | Disposition: A | Discharge status: Home / Self Care | Payer: BC Managed Care – PPO | Source: Ambulatory Visit | Attending: Orthopedic Surgery | Admitting: Orthopedic Surgery

## 2014-03-19 DIAGNOSIS — IMO0001 Reserved for inherently not codable concepts without codable children: Secondary | ICD-10-CM | POA: Diagnosis not present

## 2014-03-19 NOTE — Progress Notes (Signed)
Physical Therapy Treatment Patient Details  Name: Robin Sherman MRN: [id number] Date of Birth: 1961/02/17  Today's Date: 03/19/2014 Time: 8676-1950 PT Time Calculation (min): 52 min 30' TE 15' Estim  Visit#: 55 of 59  Re-eval: 03/26/14    Authorization: BCBS not medicare  Authorization Time Period:    Authorization Visit#: 12 of 20   Subjective: Symptoms/Limitations Symptoms: I am tired today, just worked 12 hours. Knee is stiff, 5/10 pain Pain Assessment Currently in Pain?: Yes Pain Score: 5  Pain Location: Knee Pain Orientation: Left      Exercise/Treatments        Stretches Active Hamstring Stretch: 3 reps;30 seconds;Limitations Active Hamstring Stretch Limitations: 3 way on 14" box Lt only Gastroc Stretch: 3 reps;30 seconds;Limitations Gastroc Stretch Limitations: slant board Aerobic Stationary Bike: 8 minutes seat 7;  full revolutions initial session as warm up   Standing Forward Lunges: Left;10 reps;Limitations Forward Lunges Limitations: onto 4" step UE assist for balance Side Lunges: Left;10 reps;Limitations Side Lunges Limitations: onto 4" step no UE assist Lateral Step Up: Hand Hold: 0;Step Height: 4";10 reps Forward Step Up: 10 reps;Hand Hold: 0;Step Height: 4";Limitations (6" increased pain today) Step Down: 10 reps;Hand Hold: 1;Step Height: 4" Rocker Board: 2 minutes;Limitations Rocker Board Limitations: R/L and A/P SLS: Lt max 11"/painful Other Standing Knee Exercises: 3D hip excursion 10x        Modalities Modalities: Systems analyst Stimulation Location: L knee joint Electrical Stimulation Action: interferentail Electrical Stimulation Parameters: 21 Electrical Stimulation Goals: Pain  Physical Therapy Assessment and Plan PT Assessment and Plan Clinical Impression Statement: Patient had increased pain with most TE today due to working a 12 hour shift prior to therapy. Decreased step height on fwd  step to decrease pain. Applied inferential e-stim for pain management today, patient had not tried this previously. No history of cancer, pacemaker. Patient states Mon/Tues of PT she is not in pain like today due to no having worked prior to therapy arrival..Increase step height back to 6" next session and progress TE as patient tolerates. PT Plan: Continue with current PT POC to improve functional strengthening and increased weight distribution to improve gait mechanics.    Goals    Problem List Patient Active Problem List   Diagnosis Date Noted  . Acute meniscal tear of left knee 12/23/2013  . Bursitis of left knee 11/03/2013  . Insomnia 10/29/2013  . Sciatica of left side 10/13/2013  . Arthritis of knee, degenerative 10/13/2013  . Effusion of knee joint, left 10/13/2013  . Left knee pain 10/13/2013  . UTI (urinary tract infection) 08/15/2013  . Pain, joint, multiple sites 04/15/2013  . Arthritis 12/02/2012  . Headache 09/25/2012  . Hand dermatitis 03/02/2012  . Essential hypertension, benign 09/21/2011  . Obesity, unspecified 09/21/2011  . Shoulder pain, right 09/21/2011  . Asthma, intermittent 09/21/2011  . Tobacco user 09/21/2011  . TRIGGER FINGER 11/03/2008    PT - End of Session Activity Tolerance: Patient limited by pain General Behavior During Therapy: [facility] for tasks assessed/performed  GP    Welford Christmas, [place] 03/19/2014, 9:59 AM

## 2014-03-24 ENCOUNTER — Ambulatory Visit (HOSPITAL_COMMUNITY)
Admission: RE | Admit: 2014-03-24 | Discharge: 2014-03-24 | Disposition: A | Discharge status: Home / Self Care | Payer: BC Managed Care – PPO | Source: Ambulatory Visit | Attending: Orthopedic Surgery | Admitting: Orthopedic Surgery

## 2014-03-24 DIAGNOSIS — IMO0001 Reserved for inherently not codable concepts without codable children: Secondary | ICD-10-CM | POA: Diagnosis not present

## 2014-03-24 NOTE — Progress Notes (Signed)
Physical Therapy Treatment Patient Details  Name: Robin Sherman MRN: [id number] Date of Birth: 1960-09-16  Today's Date: 03/24/2014 Time: 5621-3086 PT Time Calculation (min): 60 min Charge: TE [id number], Estim 9528-4132  Visit#: 15 of 22  Re-eval: 03/26/14 Assessment Diagnosis: Lt knee stiffness s/p Lt knee arthroscopy.  Surgical Date: 01/14/14 Next MD Visit: Dr. Aline Brochure 04/07/2014 Prior Therapy: no  Authorization: BCBS not medicare  Authorization Time Period:    Authorization Visit#: 15 of 20   Subjective: Symptoms/Limitations Symptoms: Pt stated estim relief for 2 days following, relocated her pain from anterior to posterior calf, current pain scale 5/10 premedicated . Pain Assessment Currently in Pain?: Yes Pain Score: 5  Pain Location: Knee Pain Orientation: Left;Posterior  Precautions/Restrictions  Restrictions Weight Bearing Restrictions: Yes LLE Weight Bearing: Weight bearing as tolerated  Exercise/Treatments Stretches Active Hamstring Stretch: 3 reps;30 seconds;Limitations Active Hamstring Stretch Limitations: 3 way on 14" box Lt only Gastroc Stretch: 3 reps;30 seconds;Limitations Gastroc Stretch Limitations: slant board Aerobic Stationary Bike: 8 minutes seat 6;  full revolutions initial session as warm up Standing Lateral Step Up: Left;15 reps;Hand Hold: 1;Step Height: 4" Forward Step Up: 10 reps;Hand Hold: 0;Step Height: 4";Limitations Step Down: 10 reps;Hand Hold: 1;Step Height: 4" Functional Squat: Limitations;10 reps Stairs: 3RT reciprocally 4 and 7in with 1 HR Rocker Board: 3 minutes;Limitations Rocker Board Limitations: R/L and A/P Gait Training: Gait training x 500 feet with emphasis on equal stance phase Other Standing Knee Exercises: anterior lateral lunges 2RT Other Standing Knee Exercises: 2D walking hip excursion 2RT   Modalities Modalities: Systems analyst Stimulation Location: L knee  joint Electrical Stimulation Action: Interferential Electrical Stimulation Parameters: 24 V x 15 min Electrical Stimulation Goals: Pain  Physical Therapy Assessment and Plan PT Assessment and Plan Clinical Impression Statement: Session focus on improving weight distrubtion to improve gait mechabnics, add 2D hip excursion to improve weight distrubition with therapist facilitation for equal stance phase.  Ended session with estim for pain control.   PT Plan: Continue with current PT POC to improve functional strengthening and increased weight distribution to improve gait mechanics.    Goals PT Short Term Goals PT Short Term Goal 1: Patient will demonstrate improved knee extension to 0 degrees to normalize gait PT Short Term Goal 2: Patient will demosntrate improved knee flexion to 120 degrres to allow patient to sit to a chair with weightbearign through [place] throughout descent.  PT Short Term Goal 3: Patient will increased Lt knee flexion/extension strength to 4/5 MMT to be able to perform sit to stand to and from chair 5x in <15 seconds indicating patient not at a high falls risk.  PT Short Term Goal 4: Patient will be able to ambulate 36minutes with pain less than 3/10 to be able to perform short bouts of walking for exercise.  PT Long Term Goals PT Long Term Goal 1: Patient will be able to ambulate >65minutes minutes with pain less than 2/10 to be able to go grocery shopping  PT Long Term Goal 2: Patient will increased Lt knee flexion/extension strength to 5/5 MMT to be able toambulate up and down stairs without handrail assist  Long Term Goal 3: Patient will demonstrate glut max/med strength of 4/5 MMT to be bale to perform single leg stand on Lt for >10seconds  Problem List Patient Active Problem List   Diagnosis Date Noted  . Acute meniscal tear of left knee 12/23/2013  . Bursitis of left knee 11/03/2013  . Insomnia 10/29/2013  .  Sciatica of left side 10/13/2013  . Arthritis of knee,  degenerative 10/13/2013  . Effusion of knee joint, left 10/13/2013  . Left knee pain 10/13/2013  . UTI (urinary tract infection) 08/15/2013  . Pain, joint, multiple sites 04/15/2013  . Arthritis 12/02/2012  . Headache 09/25/2012  . Hand dermatitis 03/02/2012  . Essential hypertension, benign 09/21/2011  . Obesity, unspecified 09/21/2011  . Shoulder pain, right 09/21/2011  . Asthma, intermittent 09/21/2011  . Tobacco user 09/21/2011  . TRIGGER FINGER 11/03/2008    PT - End of Session Activity Tolerance: Patient tolerated treatment well General Behavior During Therapy: [facility] for tasks assessed/performed  GP    Aldona Lento 03/24/2014, 9:47 AM

## 2014-03-25 ENCOUNTER — Ambulatory Visit (INDEPENDENT_AMBULATORY_CARE_PROVIDER_SITE_OTHER): Payer: BC Managed Care – PPO | Admitting: Family Medicine

## 2014-03-25 VITALS — BP 132/76 | HR 72 | Temp 98.1°F | Resp 14 | Ht 64.0 in | Wt 227.0 lb

## 2014-03-25 DIAGNOSIS — L089 Local infection of the skin and subcutaneous tissue, unspecified: Secondary | ICD-10-CM

## 2014-03-25 DIAGNOSIS — M1710 Unilateral primary osteoarthritis, unspecified knee: Secondary | ICD-10-CM

## 2014-03-25 DIAGNOSIS — M1712 Unilateral primary osteoarthritis, left knee: Secondary | ICD-10-CM

## 2014-03-25 DIAGNOSIS — L919 Hypertrophic disorder of the skin, unspecified: Secondary | ICD-10-CM

## 2014-03-25 DIAGNOSIS — L918 Other hypertrophic disorders of the skin: Secondary | ICD-10-CM

## 2014-03-25 DIAGNOSIS — L909 Atrophic disorder of skin, unspecified: Secondary | ICD-10-CM

## 2014-03-25 MED ORDER — OXYCODONE-ACETAMINOPHEN 10-325 MG PO TABS: 1.0000 | ORAL_TABLET | Freq: Four times a day (QID) | ORAL | Status: DC | PRN

## 2014-03-25 MED ORDER — CELECOXIB 100 MG PO CAPS: 100.0000 mg | ORAL_CAPSULE | Freq: Every day | ORAL | Status: DC

## 2014-03-25 NOTE — Patient Instructions (Signed)
We will call with lab results Try the celebrex for arthritis F/u 4 months

## 2014-03-25 NOTE — Progress Notes (Signed)
Patient ID: Robin Sherman, female   DOB: 22-Nov-1960, 53 y.o.   MRN: [id number]   Subjective:    Patient ID: Robin Sherman, female    DOB: November 13, 1960, 53 y.o.   MRN: [id number]  Patient presents for Mole Removal  patient here for mole removal. She has 3 mol that have been irritating her on her back as cause severe itching and is uncomfortable as it is near her bra strap. She would like to have these removed. She does not have any history of skin cancer.  She also requested an arthritis pill. She's unable to tolerate Naprosyn but can take ibuprofen and some of the other over-the-counter medications. She continues to have difficulty with her left knee status post surgery she has osteoarthritis as well as a meniscal tear that was repaired. She is on her cassette 5 -325 but this does not help the pain she typically has to take 2 of them at a time she has a followup with orthopedics on 9/22 but she will be out of pain medication by that visit   Review Of Systems:  GEN- denies fatigue, fever, weight loss,weakness, recent illness HEENT- denies eye drainage, change in vision, nasal discharge, CVS- denies chest pain, palpitations RESP- denies SOB, cough, wheeze MSK- + joint pain, muscle aches, injury Neuro- denies headache, dizziness, syncope, seizure activity       Objective:    BP 132/76  Pulse 72  Temp(Src) 98.1 F (36.7 C) (Oral)  Resp 14  Ht 5\' 4"  (1.626 m)  Wt 227 lb (102.967 kg)  BMI 38.95 kg/m2  LMP 04/15/2013 GEN- NAD, alert and oriented x3 Skin- Left lower back large skin tag with irritation, right inner thigh 2 small skin tags   Procedure- Skin Tag Removal Procedure explained to patient questions answered benefits and risks discussed verbal consent obtained. Antiseptic-betadine Anesthesia-lidocaine 1% with epi  Tags removed with 11 blade  Minimal blood loss, silver nitrate touched to lesion  due to persistent oozing Patient tolerated procedure well Bandage  applied         Assessment & Plan:      Problem List Items Addressed This Visit   Arthritis of knee, degenerative     Followed by ortho, s/p arthroscopy in PT Will try celebrex 100mg  once a day Also given 20 tablets of percocet 10-325mg  to get her to ortho appt    Relevant Medications      celecoxib (CELEBREX) capsule      OXYCODONE-ACETAMINOPHEN 10-325 MG PO TABS    Other Visit Diagnoses   Inflamed skin tag    -  Primary    s/p removal, send for pathology, discussed aftercare    Relevant Orders       Pathology Report       Note: This dictation was prepared with Dragon dictation along with smaller phrase technology. Any transcriptional errors that result from this process are unintentional.

## 2014-03-25 NOTE — Assessment & Plan Note (Addendum)
Followed by ortho, s/p arthroscopy in PT Will try celebrex 100mg  once a day Also given 20 tablets of percocet 10-325mg  to get her to ortho appt

## 2014-03-26 ENCOUNTER — Ambulatory Visit (HOSPITAL_COMMUNITY)
Admission: RE | Admit: 2014-03-26 | Discharge: 2014-03-26 | Disposition: A | Discharge status: Home / Self Care | Payer: BC Managed Care – PPO | Source: Ambulatory Visit | Attending: Orthopedic Surgery | Admitting: Orthopedic Surgery

## 2014-03-26 DIAGNOSIS — IMO0001 Reserved for inherently not codable concepts without codable children: Secondary | ICD-10-CM | POA: Diagnosis not present

## 2014-03-26 NOTE — Progress Notes (Signed)
Physical Therapy Treatment Patient Details  Name: Robin Sherman MRN: [id number] Date of Birth: 06/15/1961  Today's Date: 03/26/2014 Time: 3361-2244 PT Time Calculation (min): 54 min Charge:   There ex 8:54-9:30; E-stim 9:30-9:45  Visit#: 16 of 22    Authorization: BCBS not medicare   Authorization Visit#: 16 of  22   Subjective: Symptoms/Limitations Symptoms: Pt states her pain is better gives credit to the e-stim therapist explained that it is probably due more to the exercises.  Pain Assessment Pain Score: 6  Pain Location: Knee Pain Orientation: Left Pain Type: Acute pain   Exercise/Treatments     Stretches Active Hamstring Stretch: 3 reps;30 seconds;Limitations Active Hamstring Stretch Limitations: 3 way on second step Lt only Gastroc Stretch: 3 reps;30 seconds;Limitations Gastroc Stretch Limitations: slant board Aerobic Stationary Bike: 8 minutes seat 6;  full revolutions initial session as warm up   Standing Heel Raises: 10 reps Knee Flexion: Left;10 reps;Limitations Knee Flexion Limitations: 5# Forward Lunges: 10 reps Forward Lunges Limitations: on second stair  Side Lunges: 10 reps Side Lunges Limitations: on second stair Lateral Step Up: Left;Hand Hold: 1;Step Height: 6";10 reps Forward Step Up: 10 reps;Limitations;Step Height: 6";Hand Hold: 1 Step Down: 10 reps;Hand Hold: 1;Step Height: 4" Functional Squat: Limitations;10 reps Stairs: 3RT reciprocally 4 and 7in with 1 HR Rocker Board: 3 minutes;Limitations Rocker Board Limitations: R/L and A/P Gait Training: Gait training x 500 feet  .  Pt gait patter actually is better on LT, (affected) LE than Rt    Electrical Stimulation Electrical Stimulation Location: L knee joint Electrical Stimulation Action: Interferentiall Electrical Stimulation Parameters: 24V x 15 min  Physical Therapy Assessment and Plan PT Assessment and Plan Clinical Impression Statement: Pt gt mechanics looking much better.  Pt did not  have IP with ES secondary to IP aggrevating OA.  Therapist facilitated all exercises for improved form PT Plan: begin working on balance SLS    Goals  progressing   Problem List Patient Active Problem List   Diagnosis Date Noted  . Acute meniscal tear of left knee 12/23/2013  . Bursitis of left knee 11/03/2013  . Insomnia 10/29/2013  . Sciatica of left side 10/13/2013  . Arthritis of knee, degenerative 10/13/2013  . Effusion of knee joint, left 10/13/2013  . Left knee pain 10/13/2013  . UTI (urinary tract infection) 08/15/2013  . Pain, joint, multiple sites 04/15/2013  . Arthritis 12/02/2012  . Headache 09/25/2012  . Hand dermatitis 03/02/2012  . Essential hypertension, benign 09/21/2011  . Obesity, unspecified 09/21/2011  . Shoulder pain, right 09/21/2011  . Asthma, intermittent 09/21/2011  . Tobacco user 09/21/2011  . TRIGGER FINGER 11/03/2008    PT - End of Session Activity Tolerance: Patient tolerated treatment well  GP    Robin Sherman,CINDY 03/26/2014, 11:50 AM

## 2014-03-27 LAB — PATHOLOGY

## 2014-03-28 ENCOUNTER — Encounter: Payer: Self-pay | Admitting: *Deleted

## 2014-03-31 ENCOUNTER — Ambulatory Visit (HOSPITAL_COMMUNITY)
Admission: RE | Admit: 2014-03-31 | Discharge: 2014-03-31 | Disposition: A | Discharge status: Home / Self Care | Payer: BC Managed Care – PPO | Source: Ambulatory Visit | Attending: Family Medicine | Admitting: Family Medicine

## 2014-03-31 NOTE — Evaluation (Signed)
Physical Therapy Discharge  Patient Details  Name: Robin Sherman MRN: [id number] Date of Birth: 08-09-1960  Today's Date: 03/31/2014 Time: 4401-0272 PT Time Calculation (min): 32 min  TE 5366-4403, MMT/ROM x1             Visit#: 17 of 22  Assessment Diagnosis: Lt knee stiffness s/p Lt knee arthroscopy.  Surgical Date: 01/14/14 Next MD Visit: Dr. Aline Brochure 04/07/2014   Past Medical History:  Past Medical History  Diagnosis Date  . Hypertension   . Asthma   . Eczema     Followed by Dr. Nevada Crane dermatology   Past Surgical History:  Past Surgical History  Procedure Laterality Date  . Tubal ligation      [facility]  . Colonoscopy N/A 01/27/2013    Procedure: COLONOSCOPY;  Surgeon: Danie Binder, MD;  Location: [facility];  Service: Endoscopy;  Laterality: N/A;  8:30 AM  . Supracervical abdominal hysterectomy N/A 08/01/2013    Procedure: HYSTERECTOMY SUPRACERVICAL ABDOMINAL;  Surgeon: Jonnie Kind, MD;  Location: [facility] ORS;  Service: Gynecology;  Laterality: N/A;  . Bilateral salpingectomy Bilateral 08/01/2013    Procedure: BILATERAL SALPINGECTOMY;  Surgeon: Jonnie Kind, MD;  Location: [facility] ORS;  Service: Gynecology;  Laterality: Bilateral;  . Hematoma evacuation N/A 08/03/2013    Procedure: EVACUATION PELVIC HEMATOMA;  Surgeon: Jonnie Kind, MD;  Location: [facility] OR[facility];  Service: Gynecology;  Laterality: N/A;  . Abdominal hysterectomy    . Knee arthroscopy with medial menisectomy Left 01/14/2014    Procedure: KNEE ARTHROSCOPY WITH PARTIAL MEDIAL MENISECTOMY;  Surgeon: Carole Civil, MD;  Location: [facility];  Service: Orthopedics;  Laterality: Left;  . Chondroplasty Left 01/14/2014    Procedure: CHONDROPLASTY OF FEMUR;  Surgeon: Carole Civil, MD;  Location: [facility] ORS;  Service: Orthopedics;  Laterality: Left;    Subjective Symptoms/Limitations Symptoms: Pt reports her knee/pain is much better, with only mild complaints of pain at 1/10 on lateral side of knee.  How long can  you walk comfortably?: Pt reports she is able to amb while at work for ~30 minutes prior to seated rest break.  (was 5-10 minutes) Pain Assessment Currently in Pain?: Yes Pain Score:  (Worst 8/10, Best 1/10) Pain Location: Knee Pain Orientation: Left Pain Type: Acute pain   Sensation/Coordination/Flexibility/Functional Tests Flexibility 90/90: Negative (Lt HS length : 88 degrees)  Assessment LLE AROM (degrees) Left Knee Extension: 0 ((was 0)) Left Knee Flexion: 125 ((was 123)) LLE Strength Left Hip Flexion:  (4+/5 (was 4/5)) Left Hip Extension:  (4+/5 (was 4/5)) Left Hip ABduction:  (4+/5 (was 4+/5)) Left Knee Flexion:  (4+/5 (was 4/5)) Left Knee Extension:  (4+/5 (was 4/5)) Left Ankle Dorsiflexion: 5/5 ((was 5/5))  Exercise/Treatments Mobility/Balance  Static Standing Balance Single Leg Stance - Right Leg: 6 Single Leg Stance - Left Leg: 27   Stretches Active Hamstring Stretch: 3 reps;30 seconds;Limitations Active Hamstring Stretch Limitations: 3 way on 14" Box Gastroc Stretch: 3 reps;30 seconds;Limitations Gastroc Stretch Limitations: slant board Standing Stairs: 7" steps with 0 HHA ascending, 0-1 HHA descending with step through gait pattern x3  Physical Therapy Assessment and Plan PT Assessment and Plan Clinical Impression Statement: Pt reassessed and discharge completed.  Pt demonstrates improved ROM, strength, balance, and functional mobility skills from initial evaluation.  She has met 4/4 STG, and 3/4 LTG.  She reports she is able to complete household and work duties without limitations.  At this time, she reports she has not attempted a walking program as she  was unsure if she was able to; pt educated on importance of initiated walking program for improving health and activity tolerance with WB activities and instructed in how to progress program.  Pt reports she is now able to ascend/descend stairs in home with step through gait, though does have some deficiets in  eccentric control on the Lt knee with descending stairs when assessed in clinic.  Educated pt on importance of continuing HEP to improve/maintain hip/knee/ankle strength of the Lt LE; pt reports understanding of HEP and importance of continuing program.  Pt to be d/c from PT services at this time to continue with (I) HEP.  PT Plan: Pt to be D/C to (I) HEP.     Goals PT Short Term Goals PT Short Term Goal 1: Patient will demonstrate improved knee extension to 0 degrees to normalize gait PT Short Term Goal 1 - Progress: Met PT Short Term Goal 2: Patient will demosntrate improved knee flexion to 120 degrres to allow patient to sit to a chair with weightbearign through [place] throughout descent.  PT Short Term Goal 2 - Progress: Met PT Short Term Goal 3: Patient will increased Lt knee flexion/extension strength to 4/5 MMT to be able to perform sit to stand to and from chair 5x in <15 seconds indicating patient not at a high falls risk.  PT Short Term Goal 3 - Progress: Met PT Short Term Goal 4: Patient will be able to ambulate 57mnutes with pain less than 3/10 to be able to perform short bouts of walking for exercise.  PT Short Term Goal 4 - Progress: Met PT Long Term Goals PT Long Term Goal 1: Patient will be able to ambulate >364mutes minutes with pain less than 2/10 to be able to go grocery shopping  PT Long Term Goal 1 - Progress: Met PT Long Term Goal 2: Patient will increased Lt knee flexion/extension strength to 5/5 MMT to be able toambulate up and down stairs without handrail assist  PT Long Term Goal 2 - Progress: Progressing toward goal Long Term Goal 3: Patient will demonstrate glut max/med strength of 4/5 MMT to be bale to perform single leg stand on Lt for >10seconds Long Term Goal 3 Progress: Met  Problem List Patient Active Problem List   Diagnosis Date Noted  . Acute meniscal tear of left knee 12/23/2013  . Bursitis of left knee 11/03/2013  . Insomnia 10/29/2013  . Sciatica of  left side 10/13/2013  . Arthritis of knee, degenerative 10/13/2013  . Effusion of knee joint, left 10/13/2013  . Left knee pain 10/13/2013  . UTI (urinary tract infection) 08/15/2013  . Pain, joint, multiple sites 04/15/2013  . Arthritis 12/02/2012  . Headache 09/25/2012  . Hand dermatitis 03/02/2012  . Essential hypertension, benign 09/21/2011  . Obesity, unspecified 09/21/2011  . Shoulder pain, right 09/21/2011  . Asthma, intermittent 09/21/2011  . Tobacco user 09/21/2011  . TRIGGER FINGER 11/03/2008    PT - End of Session Activity Tolerance: Patient tolerated treatment well General Behavior During Therapy: [facility]performed   Tunis Gentle 03/31/2014, 9:27 AM  Physician Documentation Your signature is required to indicate approval of the treatment plan as stated above.  Please sign and either send electronically or make a copy of this report for your files and return this physician signed original.   Please mark one 1.__approve of plan  2. ___approve of plan with the following conditions.   ______________________________  _____________________ Physician Signature                                                                                                             Date

## 2014-03-31 NOTE — Evaluation (Signed)
SBNR 

## 2014-04-07 ENCOUNTER — Ambulatory Visit (INDEPENDENT_AMBULATORY_CARE_PROVIDER_SITE_OTHER): Payer: BC Managed Care – PPO | Admitting: Orthopedic Surgery

## 2014-04-07 ENCOUNTER — Encounter: Payer: Self-pay | Admitting: Orthopedic Surgery

## 2014-04-07 VITALS — BP 151/92 | Ht 64.0 in | Wt 227.0 lb

## 2014-04-07 DIAGNOSIS — Z9889 Other specified postprocedural states: Secondary | ICD-10-CM

## 2014-04-07 DIAGNOSIS — M23322 Other meniscus derangements, posterior horn of medial meniscus, left knee: Secondary | ICD-10-CM

## 2014-04-07 DIAGNOSIS — M1712 Unilateral primary osteoarthritis, left knee: Secondary | ICD-10-CM

## 2014-04-07 DIAGNOSIS — M1710 Unilateral primary osteoarthritis, unspecified knee: Secondary | ICD-10-CM

## 2014-04-07 DIAGNOSIS — M23329 Other meniscus derangements, posterior horn of medial meniscus, unspecified knee: Secondary | ICD-10-CM

## 2014-04-07 MED ORDER — HYDROCODONE-ACETAMINOPHEN 10-325 MG PO TABS: 1.0000 | ORAL_TABLET | ORAL | Status: DC | PRN

## 2014-04-07 NOTE — Progress Notes (Signed)
Chief Complaint  Patient presents with  . Follow-up    1 month recheck left knee, SALK 01/14/14    PRE-OPERATIVE DIAGNOSIS:  left medial meniscal tear  POST-OPERATIVE DIAGNOSIS:  left medial meniscal tear, osteoarthritis  PROCEDURE:  Procedure(s): KNEE ARTHROSCOPY WITH PARTIAL MEDIAL MENISECTOMY (Left) CHONDROPLASTY OF FEMUR (Left)  Operative findings the medial meniscus was torn at the root of the posterior horn. There was an oblique tear going right back to the red red zone. She also had grade 2 chondromalacia of the medial femoral condyle.  Lateral compartment normal trochlear patella compartment normal notch normal  She says her knee is doing better now she would like her pain medicine changed over to 10 mg.  I advised her on the dangers of Norco and hydrocodone type medication and the need to start a weaning process starting with 10 mg for one month and 7.5 mg for a month and 5 mg for a month and if necessary go to chronic pain management.  There is some patellar tendon tenderness which is from rehabilitation her joint lines are nontender she has no swelling and she has full range of motion  Followup as needed

## 2014-04-09 ENCOUNTER — Telehealth: Payer: Self-pay | Admitting: Orthopedic Surgery

## 2014-04-09 NOTE — Telephone Encounter (Signed)
Notes faxed (Dates of service 04/07/14, 03/09/14) to [employer] short-term disability (Fax [phone]) as per signed authorization request.  Patient aware.

## 2014-04-29 ENCOUNTER — Other Ambulatory Visit: Payer: Self-pay | Admitting: Family Medicine

## 2014-04-29 NOTE — Telephone Encounter (Signed)
Okay to refill? 

## 2014-04-29 NOTE — Telephone Encounter (Signed)
Medication called to pharmacy.  Prescription sent to pharmacy.  

## 2014-04-29 NOTE — Telephone Encounter (Signed)
Ok to refill Ambien??  Last office visit 03/25/2014.  Last refill 02/02/2014, #2 refills.

## 2014-06-06 ENCOUNTER — Other Ambulatory Visit: Payer: Self-pay | Admitting: Family Medicine

## 2014-06-09 NOTE — Telephone Encounter (Signed)
Refill appropriate and filled per protocol. 

## 2014-06-15 ENCOUNTER — Other Ambulatory Visit: Payer: Self-pay | Admitting: *Deleted

## 2014-06-15 ENCOUNTER — Telehealth: Payer: Self-pay | Admitting: Orthopedic Surgery

## 2014-06-15 MED ORDER — HYDROCODONE-ACETAMINOPHEN 10-325 MG PO TABS
1.0000 | ORAL_TABLET | ORAL | Status: DC | PRN
Start: 2014-06-15 — End: 2014-06-16

## 2014-06-15 NOTE — Telephone Encounter (Signed)
Patient is asking for a refill on pain medication oxyCODONE-acetaminophen (PERCOCET) 10-325 MG per tablet  OR   HYDROcodone-acetaminophen (NORCO) 10-325 MG per tablet  For her knee pain please advise?

## 2014-06-16 ENCOUNTER — Other Ambulatory Visit: Payer: Self-pay | Admitting: Orthopedic Surgery

## 2014-06-16 DIAGNOSIS — M25569 Pain in unspecified knee: Secondary | ICD-10-CM

## 2014-06-16 MED ORDER — HYDROCODONE-ACETAMINOPHEN 7.5-325 MG PO TABS: 1.0000 | ORAL_TABLET | Freq: Four times a day (QID) | ORAL | Status: DC | PRN

## 2014-06-16 NOTE — Telephone Encounter (Signed)
Prescription available for pick up, called patient, no answer 

## 2014-06-17 NOTE — Telephone Encounter (Signed)
Patient picked up Rx

## 2014-06-22 ENCOUNTER — Ambulatory Visit (INDEPENDENT_AMBULATORY_CARE_PROVIDER_SITE_OTHER): Payer: BC Managed Care – PPO | Admitting: Family Medicine

## 2014-06-22 ENCOUNTER — Encounter: Payer: Self-pay | Admitting: Family Medicine

## 2014-06-22 VITALS — BP 142/70 | HR 68 | Temp 98.6°F | Resp 16 | Ht 64.0 in | Wt 222.0 lb

## 2014-06-22 DIAGNOSIS — N760 Acute vaginitis: Secondary | ICD-10-CM

## 2014-06-22 DIAGNOSIS — B9689 Other specified bacterial agents as the cause of diseases classified elsewhere: Secondary | ICD-10-CM

## 2014-06-22 DIAGNOSIS — N898 Other specified noninflammatory disorders of vagina: Secondary | ICD-10-CM

## 2014-06-22 DIAGNOSIS — A499 Bacterial infection, unspecified: Secondary | ICD-10-CM

## 2014-06-22 DIAGNOSIS — R300 Dysuria: Secondary | ICD-10-CM

## 2014-06-22 LAB — URINALYSIS, ROUTINE W REFLEX MICROSCOPIC
Bilirubin Urine: NEGATIVE
Glucose, UA: NEGATIVE mg/dL
Hgb urine dipstick: NEGATIVE
Ketones, ur: NEGATIVE mg/dL
LEUKOCYTES UA: NEGATIVE
Nitrite: NEGATIVE
PH: 5.5 (ref 5.0–8.0)
Protein, ur: NEGATIVE mg/dL
Specific Gravity, Urine: 1.025 (ref 1.005–1.030)
Urobilinogen, UA: 0.2 mg/dL (ref 0.0–1.0)

## 2014-06-22 LAB — WET PREP FOR TRICH, YEAST, CLUE: TRICH WET PREP: NONE SEEN

## 2014-06-22 MED ORDER — FLUCONAZOLE 150 MG PO TABS: 150.0000 mg | ORAL_TABLET | Freq: Once | ORAL | Status: DC

## 2014-06-22 MED ORDER — METRONIDAZOLE 500 MG PO TABS: 500.0000 mg | ORAL_TABLET | Freq: Two times a day (BID) | ORAL | Status: DC

## 2014-06-22 NOTE — Patient Instructions (Addendum)
Take antibiotics as prescribed Continue with cranberry tablets  If leaking persist call and we will try bladder medication F/u 3 months

## 2014-06-22 NOTE — Progress Notes (Signed)
Patient ID: Robin Sherman, female   DOB: March 06, 1961, 53 y.o.   MRN: [id number]   Subjective:    Patient ID: Robin Sherman, female    DOB: 1960/09/25, 53 y.o.   MRN: [id number]  Patient presents for UTI  patient here with dysuria on and off for the past 3 weeks. She states that sometimes she will just leak urine but it also has a burning sensation over her vulva. She has had some vaginal discharge as well. She has not had any vaginal bleeding. Her Pap smear was normal in May 2015. She has also had a twinge of back pain associated with her symptoms but no fever. Some mild lower abdominal pain which is now resolved. She has used Vaseline and was taking her hydrocodone which was given to her by her orthopedist but this did not help her pain.    Review Of Systems:  GEN- denies fatigue, fever, weight loss,weakness, recent illness HEENT- denies eye drainage, change in vision, nasal discharge, CVS- denies chest pain, palpitations RESP- denies SOB, cough, wheeze ABD- denies N/V, change in stools, abd pain GU- denies dysuria, hematuria, dribbling, incontinence MSK- denies joint pain, muscle aches, injury Neuro- denies headache, dizziness, syncope, seizure activity       Objective:    BP 142/70 mmHg  Pulse 68  Temp(Src) 98.6 F (37 C) (Oral)  Resp 16  Ht 5\' 4"  (1.626 m)  Wt 222 lb (100.699 kg)  BMI 38.09 kg/m2  LMP 04/15/2013 GEN- NAD, alert and oriented x3 CVS- RRR, no murmur RESP-CTAB ABD-NABS,soft,mild TTP lower quadrants, Suprapubic region GU- normal external genitalia, vaginal mucosa pink and moist, cervix visualized no growth, no blood form os, + discharge, no CMT, no ovarian masses, uterus absent MSK- mild TTP lumbar paraspinals, spine NT, good ROM lumbar spine EXT- No edema Pulses- Radial, DP- 2+        Assessment & Plan:      Problem List Items Addressed This Visit    None    Visit Diagnoses    Burning with urination    -  Primary    Relevant Orders    Urinalysis, Routine w reflex microscopic (Completed)    Vaginal discharge        Relevant Orders       WET PREP FOR TRICH, YEAST, CLUE (Completed)    BV (bacterial vaginosis)        Flagyl x 7 days given, yeast also seen diflucan pill once complete, UA neg    Relevant Medications       metroNIDAZOLE (FLAGYL) tablet       fluconazole (DIFLUCAN) tablet 150 mg       Note: This dictation was prepared with Dragon dictation along with smaller phrase technology. Any transcriptional errors that result from this process are unintentional.    .tcnot

## 2014-07-07 ENCOUNTER — Other Ambulatory Visit: Payer: Self-pay | Admitting: Family Medicine

## 2014-07-08 NOTE — Telephone Encounter (Signed)
Refill appropriate and filled per protocol. 

## 2014-07-27 ENCOUNTER — Telehealth: Payer: Self-pay | Admitting: Family Medicine

## 2014-07-27 NOTE — Telephone Encounter (Signed)
[id number] PT is having pain going up and down her leg and hard to put pressure on leg she would like to have something for pain

## 2014-07-27 NOTE — Telephone Encounter (Signed)
Patient returned to office and was made aware that she would need to see Ortho about her leg since she is already being followed.   Verbalized understanding.

## 2014-07-27 NOTE — Telephone Encounter (Signed)
Call placed to patient. LMTRC.  

## 2014-09-15 ENCOUNTER — Other Ambulatory Visit: Payer: Self-pay | Admitting: *Deleted

## 2014-09-15 ENCOUNTER — Telehealth: Payer: Self-pay | Admitting: Orthopedic Surgery

## 2014-09-15 ENCOUNTER — Other Ambulatory Visit: Payer: Self-pay | Admitting: Orthopedic Surgery

## 2014-09-15 DIAGNOSIS — M25569 Pain in unspecified knee: Secondary | ICD-10-CM

## 2014-09-15 MED ORDER — HYDROCODONE-ACETAMINOPHEN 5-325 MG PO TABS: 1.0000 | ORAL_TABLET | Freq: Four times a day (QID) | ORAL | Status: DC | PRN

## 2014-09-15 MED ORDER — HYDROCODONE-ACETAMINOPHEN 7.5-325 MG PO TABS: 1.0000 | ORAL_TABLET | Freq: Four times a day (QID) | ORAL | Status: DC | PRN

## 2014-09-15 NOTE — Progress Notes (Addendum)
     Please be advised:  You are on a narcotic pain medication to control your pain. Medications such as hydrocodone, Norco, oxycodone, OxyContin,  Dilaudid and similar medications have been associated with dependence and addiction. Please work with me to try to eliminate these medications as soon as possible to avoid complications of dependence and addiction.  Only take these medications as prescribed and for the problem they were prescribed for. Once your injury has healed please wean yourself from these medications. Once your surgery has been completed please start taking as little of this medication as possible.  If you have a problem taking yourself off of these medications please discuss with me so that together we can avoid serious complications which include dependence, addiction and death.

## 2014-09-15 NOTE — Telephone Encounter (Signed)
Patient is requesting medication refill on HYDROcodone-acetaminophen (NORCO) 7.5-325 MG per tablet [[id number]] for left leg pain, please advise?

## 2014-09-15 NOTE — Telephone Encounter (Signed)
Prescription available, called patient, no answer 

## 2014-09-21 ENCOUNTER — Encounter: Payer: Self-pay | Admitting: Orthopedic Surgery

## 2014-09-21 ENCOUNTER — Ambulatory Visit (INDEPENDENT_AMBULATORY_CARE_PROVIDER_SITE_OTHER): Payer: BLUE CROSS/BLUE SHIELD | Admitting: Orthopedic Surgery

## 2014-09-21 VITALS — BP 127/85 | Ht 64.0 in | Wt 227.0 lb

## 2014-09-21 DIAGNOSIS — Z9889 Other specified postprocedural states: Secondary | ICD-10-CM

## 2014-09-21 DIAGNOSIS — M1712 Unilateral primary osteoarthritis, left knee: Secondary | ICD-10-CM

## 2014-09-21 DIAGNOSIS — M23322 Other meniscus derangements, posterior horn of medial meniscus, left knee: Secondary | ICD-10-CM

## 2014-09-21 MED ORDER — PREDNISONE (PAK) 10 MG PO TABS: ORAL_TABLET | Freq: Every day | ORAL | Status: DC

## 2014-09-21 MED ORDER — OXYCODONE-ACETAMINOPHEN 5-325 MG PO TABS: 1.0000 | ORAL_TABLET | ORAL | Status: DC | PRN

## 2014-09-21 NOTE — Patient Instructions (Signed)
Ice as needed   Take new medications

## 2014-09-21 NOTE — Progress Notes (Signed)
Patient ID: Robin Sherman, female   DOB: [birth year], 54 y.o.   MRN: [id number]  Chief Complaint  Patient presents with  . Follow-up    follow up leg pain   Correction chief complaint left knee pain  Status post left knee arthroscopy in July 2015. The patient was noncompliant one is due on therapy instead of rehabilitation with a therapist and she also went back earlier than what we wanted she comes in complaining of pain and swelling in her left knee. No catching locking or giving way just pain over the medial compartment HPI Robin Sherman is a 54 y.o. female.  History as noted HPI  Review of Systems See above  Past Medical History  Diagnosis Date  . Hypertension   . Asthma   . Eczema     Followed by Dr. Nevada Crane dermatology    Past Surgical History  Procedure Laterality Date  . Tubal ligation      [facility]  . Colonoscopy N/A 01/27/2013    Procedure: COLONOSCOPY;  Surgeon: Danie Binder, MD;  [facility];  Service: Endoscopy;  Laterality: N/A;  8:30 AM  . Supracervical abdominal hysterectomy N/A 08/01/2013    Procedure: HYSTERECTOMY SUPRACERVICAL ABDOMINAL;  Surgeon: Jonnie Kind, MD;  Location: [facility];  Service: Gynecology;  Laterality: N/A;  . Bilateral salpingectomy Bilateral 08/01/2013    Procedure: BILATERAL SALPINGECTOMY;  Surgeon: Jonnie Kind, MD;  Location: [facility];  Service: Gynecology;  Laterality: Bilateral;  . Hematoma evacuation N/A 08/03/2013    Procedure: EVACUATION PELVIC HEMATOMA;  Surgeon: Jonnie Kind, MD;  Location: [facility];  Service: Gynecology;  Laterality: N/A;  . Abdominal hysterectomy    . Knee arthroscopy with medial menisectomy Left 01/14/2014    Procedure: KNEE ARTHROSCOPY WITH PARTIAL MEDIAL MENISECTOMY;  Surgeon: Carole Civil, MD;  Location: [facility];  Service: Orthopedics;  Laterality: Left;  . Chondroplasty Left 01/14/2014    Procedure: CHONDROPLASTY OF FEMUR;  Surgeon: Carole Civil, MD;  Location: [facility];  Service:  Orthopedics;  Laterality: Left;    Family History  Problem Relation Age of Onset  . Heart disease Mother     MI  . Stroke Father   . Heart disease Father     MI  . Cancer Sister     breast  . Colon cancer Neg Hx     Social History History  Substance Use Topics  . Smoking status: Current Every Day Smoker -- 0.10 packs/day for 20 years    Types: Cigarettes  . Smokeless tobacco: Never Used  . Alcohol Use: Yes     Comment: Rare    Allergies  Allergen Reactions  . Codeine Hives  . Naproxen Rash    Patient tolerates Ibuprofen    Current Outpatient Prescriptions  Medication Sig Dispense Refill  . albuterol (PROVENTIL HFA;VENTOLIN HFA) 108 (90 BASE) MCG/ACT inhaler Inhale 2 puffs into the lungs every 4 (four) hours as needed for wheezing or shortness of breath. 1 Inhaler 3  . amLODipine (NORVASC) 10 MG tablet TAKE 1 TABLET BY MOUTH DAILY. 30 tablet 6  . fluconazole (DIFLUCAN) 150 MG tablet Take 1 tablet (150 mg total) by mouth once. 1 tablet 0  . halobetasol (ULTRAVATE) 0.05 % cream Apply topically 2 (two) times daily. 50 g 3  . hydrochlorothiazide (HYDRODIURIL) 25 MG tablet TAKE ONE TABLET BY MOUTH ONCE DAILY. 30 tablet 6  . HYDROcodone-acetaminophen (NORCO/VICODIN) 5-325 MG per tablet Take 1 tablet by mouth every 6 (six) hours  as needed for moderate pain. 120 tablet 0  . hydrOXYzine (VISTARIL) 50 MG capsule Take 1 capsule (50 mg total) by mouth 3 (three) times daily as needed. 60 capsule 2  . metroNIDAZOLE (FLAGYL) 500 MG tablet Take 1 tablet (500 mg total) by mouth 2 (two) times daily. 14 tablet 0  . zolpidem (AMBIEN) 10 MG tablet TAKE ONE TABLET BY MOUTH AT BEDTIME AS NEEDED FOR SLEEP. 30 tablet 0  . [DISCONTINUED] lisinopril-hydrochlorothiazide (PRINZIDE,ZESTORETIC) 20-12.5 MG per tablet Take 1 tablet by mouth daily.       No current facility-administered medications for this visit.       Physical Exam Blood pressure 127/85, height 5\' 4"  (1.626 m), weight 227 lb  (102.967 kg), last menstrual period 04/15/2013. Physical Exam The patient is well developed well nourished and well groomed. Orientation to person place and time is normal  Mood is pleasant. Ambulatory status she is limping favoring the left leg Left knee no effusion but medial joint line tenderness is noted McMurray sign is negative tenderness is over the joint line and medial femoral condyle and tibia with mild tenderness over the bursa of the peds anserine tendons. Slight flexion contracture less than 5 knee flexion 125 knee stability normal strength normal skin normal pulses normal sensation normal  Data Reviewed Reviewed the operative report PRE-OPERATIVE DIAGNOSIS:  left medial meniscal tear  POST-OPERATIVE DIAGNOSIS:  left medial meniscal tear, osteoarthritis  PROCEDURE:  Procedure(s): KNEE ARTHROSCOPY WITH PARTIAL MEDIAL MENISECTOMY (Left) CHONDROPLASTY OF FEMUR (Left)  Operative findings the medial meniscus was torn at the root of the posterior horn. There was an oblique tear going right back to the red red zone. She also had grade 2 chondromalacia of the medial femoral condyle.  Lateral compartment normal trochlear patella compartment normal notch normal   Assessment    I think she is having exacerbation of her underlying arthritis no mechanical symptoms chest pain    Plan    We injected the knee and started her on Percocet 5 mg every 4 when necessary pain and 30-10 mg prednisone tablets one a day

## 2014-10-01 NOTE — Telephone Encounter (Signed)
Patient picked up Rx

## 2014-10-06 ENCOUNTER — Other Ambulatory Visit: Payer: Self-pay | Admitting: Family Medicine

## 2014-10-07 NOTE — Telephone Encounter (Signed)
Refill appropriate and filled per protocol. 

## 2014-10-20 ENCOUNTER — Ambulatory Visit: Payer: BLUE CROSS/BLUE SHIELD | Admitting: Orthopedic Surgery

## 2014-10-21 ENCOUNTER — Ambulatory Visit: Payer: BLUE CROSS/BLUE SHIELD | Admitting: Orthopedic Surgery

## 2014-10-26 ENCOUNTER — Ambulatory Visit (INDEPENDENT_AMBULATORY_CARE_PROVIDER_SITE_OTHER): Payer: BLUE CROSS/BLUE SHIELD | Admitting: Orthopedic Surgery

## 2014-10-26 VITALS — BP 131/86 | Ht 64.0 in | Wt 227.0 lb

## 2014-10-26 DIAGNOSIS — M25562 Pain in left knee: Secondary | ICD-10-CM

## 2014-10-26 DIAGNOSIS — Z9889 Other specified postprocedural states: Secondary | ICD-10-CM

## 2014-10-26 DIAGNOSIS — M1712 Unilateral primary osteoarthritis, left knee: Secondary | ICD-10-CM | POA: Diagnosis not present

## 2014-10-26 DIAGNOSIS — M23322 Other meniscus derangements, posterior horn of medial meniscus, left knee: Secondary | ICD-10-CM | POA: Diagnosis not present

## 2014-10-26 NOTE — Progress Notes (Signed)
Patient ID: Robin Sherman, female   DOB: 1960-11-18, 54 y.o.   MRN: [id number] Chief Complaint  Patient presents with  . Follow-up    4 week recheck on left knee after injection.    Encounter Diagnoses  Name Primary?  . S/P arthroscopic knee surgery Yes  . Left knee pain   . Derangement of posterior horn of medial meniscus, left   . Primary osteoarthritis of left knee     Status post knee arthroscopy status post injection left knee after she developed some postoperative knee pain and swelling this seems to have done the trick  She is doing well now. Her knee looks good she has full passive and active range of motion without pain or tenderness  Follow-up as needed for repeat injections if necessary. She is currently on Celebrex hydrocodone amlodipine and hydrochlorothiazide

## 2014-12-20 ENCOUNTER — Other Ambulatory Visit: Payer: Self-pay | Admitting: Family Medicine

## 2014-12-22 NOTE — Telephone Encounter (Signed)
Pt needs labs and ov before next refill

## 2014-12-29 ENCOUNTER — Ambulatory Visit (INDEPENDENT_AMBULATORY_CARE_PROVIDER_SITE_OTHER): Payer: BLUE CROSS/BLUE SHIELD | Admitting: Family Medicine

## 2014-12-29 ENCOUNTER — Other Ambulatory Visit: Payer: Self-pay | Admitting: Family Medicine

## 2014-12-29 ENCOUNTER — Encounter: Payer: Self-pay | Admitting: Family Medicine

## 2014-12-29 VITALS — BP 132/80 | HR 82 | Temp 98.9°F | Resp 14 | Ht 64.0 in | Wt 225.0 lb

## 2014-12-29 DIAGNOSIS — Z1321 Encounter for screening for nutritional disorder: Secondary | ICD-10-CM

## 2014-12-29 DIAGNOSIS — Z23 Encounter for immunization: Secondary | ICD-10-CM | POA: Diagnosis not present

## 2014-12-29 DIAGNOSIS — J4520 Mild intermittent asthma, uncomplicated: Secondary | ICD-10-CM | POA: Diagnosis not present

## 2014-12-29 DIAGNOSIS — Z0000 Encounter for general adult medical examination without abnormal findings: Secondary | ICD-10-CM | POA: Diagnosis not present

## 2014-12-29 DIAGNOSIS — E669 Obesity, unspecified: Secondary | ICD-10-CM

## 2014-12-29 DIAGNOSIS — I10 Essential (primary) hypertension: Secondary | ICD-10-CM | POA: Diagnosis not present

## 2014-12-29 DIAGNOSIS — M1712 Unilateral primary osteoarthritis, left knee: Secondary | ICD-10-CM | POA: Diagnosis not present

## 2014-12-29 LAB — LIPID PANEL
CHOL/HDL RATIO: 4 ratio
Cholesterol: 192 mg/dL (ref 0–200)
HDL: 48 mg/dL (ref >=46)
LDL CALC: 117 mg/dL — AB (ref 0–99)
TRIGLYCERIDES: 133 mg/dL (ref <150)
VLDL: 27 mg/dL (ref 0–40)

## 2014-12-29 LAB — COMPREHENSIVE METABOLIC PANEL
ALBUMIN: 4.6 g/dL (ref 3.5–5.2)
ALK PHOS: 114 U/L (ref 39–117)
ALT: 25 U/L (ref 0–35)
AST: 22 U/L (ref 0–37)
BILIRUBIN TOTAL: 0.3 mg/dL (ref 0.2–1.2)
BUN: 12 mg/dL (ref 6–23)
CO2: 22 mEq/L (ref 19–32)
CREATININE: 0.67 mg/dL (ref 0.50–1.10)
Calcium: 9.9 mg/dL (ref 8.4–10.5)
Chloride: 102 mEq/L (ref 96–112)
Glucose, Bld: 127 mg/dL — ABNORMAL HIGH (ref 70–99)
POTASSIUM: 4.1 meq/L (ref 3.5–5.3)
Sodium: 140 mEq/L (ref 135–145)
Total Protein: 7.6 g/dL (ref 6.0–8.3)

## 2014-12-29 MED ORDER — HYDROCODONE-ACETAMINOPHEN 7.5-325 MG PO TABS: 1.0000 | ORAL_TABLET | Freq: Four times a day (QID) | ORAL | Status: DC | PRN

## 2014-12-29 MED ORDER — ZOLPIDEM TARTRATE 10 MG PO TABS: ORAL_TABLET | ORAL | Status: DC

## 2014-12-29 MED ORDER — AMLODIPINE BESYLATE 10 MG PO TABS: 10.0000 mg | ORAL_TABLET | Freq: Every day | ORAL | Status: DC

## 2014-12-29 MED ORDER — HYDROCHLOROTHIAZIDE 25 MG PO TABS: 25.0000 mg | ORAL_TABLET | Freq: Every day | ORAL | Status: DC

## 2014-12-29 NOTE — Assessment & Plan Note (Signed)
Now back at baseline, I gave her samples of Qvar, possible recent viral illness or due to pollen that has been causing some issues with her asthma,

## 2014-12-29 NOTE — Assessment & Plan Note (Signed)
Well controlled, no change to meds, fasting labs today

## 2014-12-29 NOTE — Progress Notes (Signed)
Patient ID: Robin Sherman, female   DOB: March 03, 1961, 54 y.o.   MRN: [id number]   Subjective:    Patient ID: Robin Sherman, female    DOB: Jul 09, 1961, 54 y.o.   MRN: [id number]  Patient presents for CPE- no PAP and Cough  patient here for complete physical exam. Her only complaint is a week ago she had increased cough with wheezing she thought it was due to her blood pressure medication. She tried taking her albuterol but this seemed to make it worse. She used another inhaler from her friend and this helped but she does not know what it was. She has been doing fairly well with her asthma the past few months until last week. She continues to have chronic left knee pain she is meniscal tear and arthritis in the knee she has seen orthopedics they have done arthroscopic and given her Synthroid injections but this has not helped. She does not have time to go out of work for any further intervention and request a refill on her pain medication.  Mammogram is due in August 2016, colonoscopy up-to-date, Pap smear up-to-date since she's had supracervical hysterectomy. She is due for Pneumovax 23 secondary to asthma  Review Of Systems:  GEN- denies fatigue, fever, weight loss,weakness, recent illness HEENT- denies eye drainage, change in vision, nasal discharge, CVS- denies chest pain, palpitations RESP- denies SOB, cough, wheeze ABD- denies N/V, change in stools, abd pain GU- denies dysuria, hematuria, dribbling, incontinence MSK- + joint pain, muscle aches, injury Neuro- denies headache, dizziness, syncope, seizure activity       Objective:    BP 132/80 mmHg  Pulse 82  Temp(Src) 98.9 F (37.2 C) (Oral)  Resp 14  Ht 5\' 4"  (1.626 m)  Wt 225 lb (102.059 kg)  BMI 38.60 kg/m2  LMP 04/15/2013 GEN- NAD, alert and oriented x3 HEENT- PERRL, EOMI, non injected sclera, pink conjunctiva, MMM, oropharynx clear, TM clear bilat  No effusion Neck- Supple, no thyromegaly CVS- RRR, no  murmur RESP-CTAB ABD-NABS,soft,NT,ND EXT- No edema Pulses- Radial, DP- 2+        Assessment & Plan:      Problem List Items Addressed This Visit    Obesity   Essential hypertension, benign   Relevant Orders   CBC with Differential/Platelet   Comprehensive metabolic panel   Lipid panel   Asthma, intermittent   Relevant Medications   beclomethasone (QVAR) 40 MCG/ACT inhaler    Other Visit Diagnoses    Routine general medical examination at a health care facility    -  Primary    CPE done, PAP Smear UTD, Pneumonia vaccine due to asthma and smoking, pt to schedule mammogram    Encounter for vitamin deficiency screening        Relevant Orders    Vitamin D, 25-hydroxy       Note: This dictation was prepared with Dragon dictation along with smaller phrase technology. Any transcriptional errors that result from this process are unintentional.

## 2014-12-29 NOTE — Assessment & Plan Note (Signed)
Norco 7.5mg -325mg  given

## 2014-12-29 NOTE — Assessment & Plan Note (Signed)
Discussed need for weight loss and dietary changes

## 2014-12-29 NOTE — Patient Instructions (Signed)
I recommend eye visit once a year I recommend dental visit every 6 months Goal is to  Exercise 30 minutes 5 days a week We will send a letter with lab results  Call if you start Qvar  Schedule your mammogram F/U 6 months

## 2014-12-30 LAB — CBC WITH DIFFERENTIAL/PLATELET
Basophils Absolute: 0 10*3/uL (ref 0.0–0.1)
Basophils Relative: 0 % (ref 0–1)
EOS ABS: 0.5 10*3/uL (ref 0.0–0.7)
Eosinophils Relative: 5 % (ref 0–5)
HEMATOCRIT: 43 % (ref 36.0–46.0)
Hemoglobin: 14.4 g/dL (ref 12.0–15.0)
LYMPHS ABS: 2.8 10*3/uL (ref 0.7–4.0)
Lymphocytes Relative: 31 % (ref 12–46)
MCH: 28.9 pg (ref 26.0–34.0)
MCHC: 33.5 g/dL (ref 30.0–36.0)
MCV: 86.2 fL (ref 78.0–100.0)
MONO ABS: 0.5 10*3/uL (ref 0.1–1.0)
MPV: 9.8 fL (ref 8.6–12.4)
Monocytes Relative: 6 % (ref 3–12)
Neutro Abs: 5.3 10*3/uL (ref 1.7–7.7)
Neutrophils Relative %: 58 % (ref 43–77)
Platelets: 385 10*3/uL (ref 150–400)
RBC: 4.99 MIL/uL (ref 3.87–5.11)
RDW: 15 % (ref 11.5–15.5)
WBC: 9.1 10*3/uL (ref 4.0–10.5)

## 2014-12-30 LAB — VITAMIN D 25 HYDROXY (VIT D DEFICIENCY, FRACTURES): Vit D, 25-Hydroxy: 19 ng/mL — ABNORMAL LOW (ref 30–100)

## 2015-01-01 ENCOUNTER — Other Ambulatory Visit: Payer: Self-pay | Admitting: *Deleted

## 2015-01-01 LAB — HEMOGLOBIN A1C
HEMOGLOBIN A1C: 6.3 % — AB (ref <5.7)
Mean Plasma Glucose: 134 mg/dL — ABNORMAL HIGH (ref <117)

## 2015-01-01 MED ORDER — VITAMIN D (ERGOCALCIFEROL) 1.25 MG (50000 UNIT) PO CAPS: ORAL_CAPSULE | ORAL | Status: DC

## 2015-01-19 IMAGING — MG MM DIGITAL SCREENING BILAT W/ CAD
5 series · 5 of 5 positions shown · non-contrast
Comparison: Previous Exam(s)

CLINICAL DATA: Screening.

EXAM:
DIGITAL SCREENING BILATERAL MAMMOGRAM WITH CAD

[L CC]
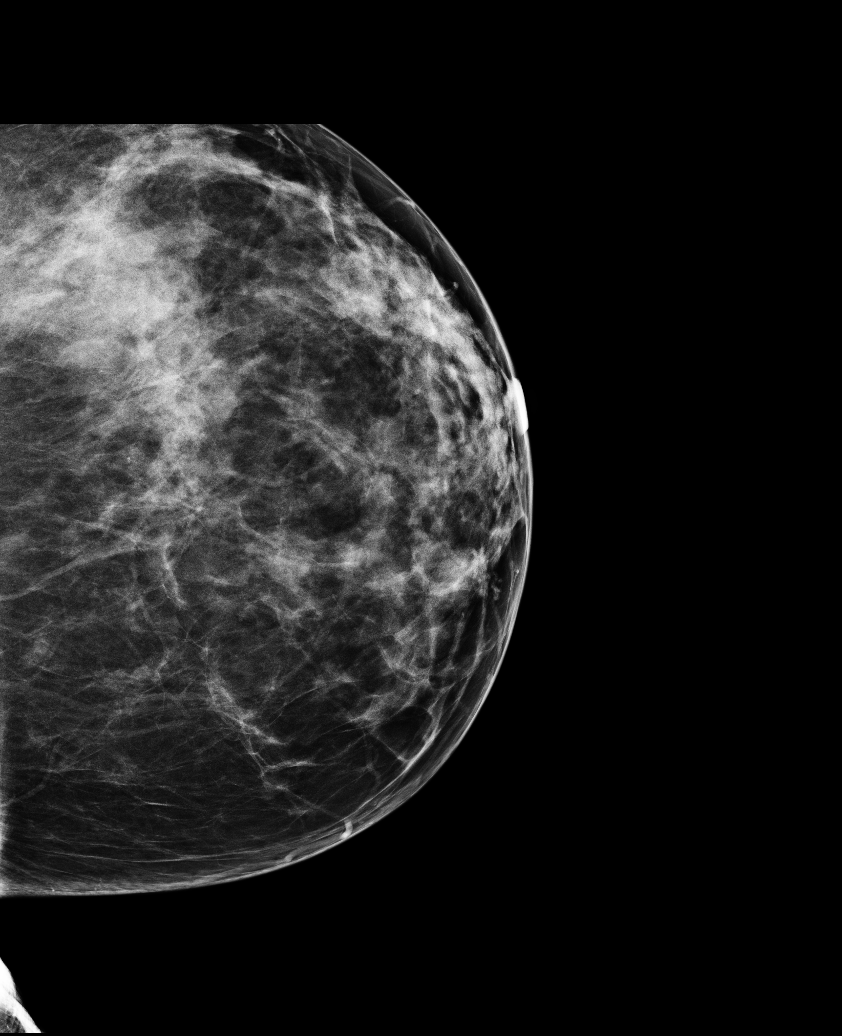

[L MLO (1 of 2)]
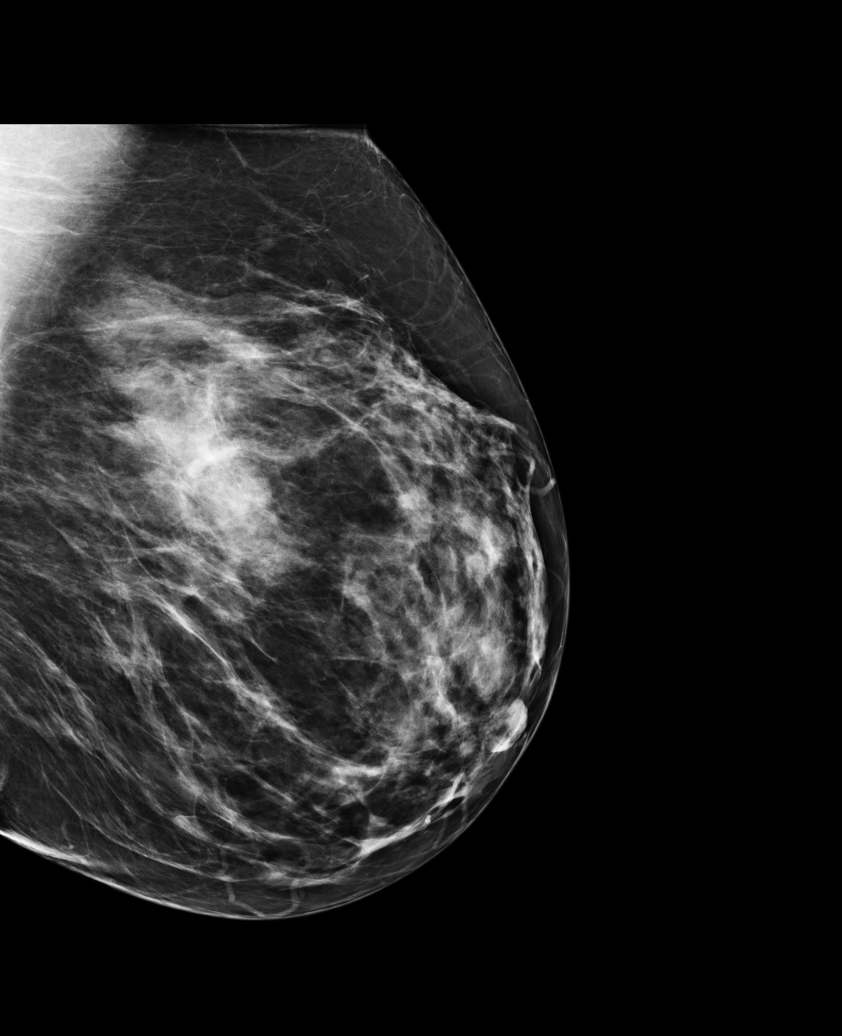

[R CC]
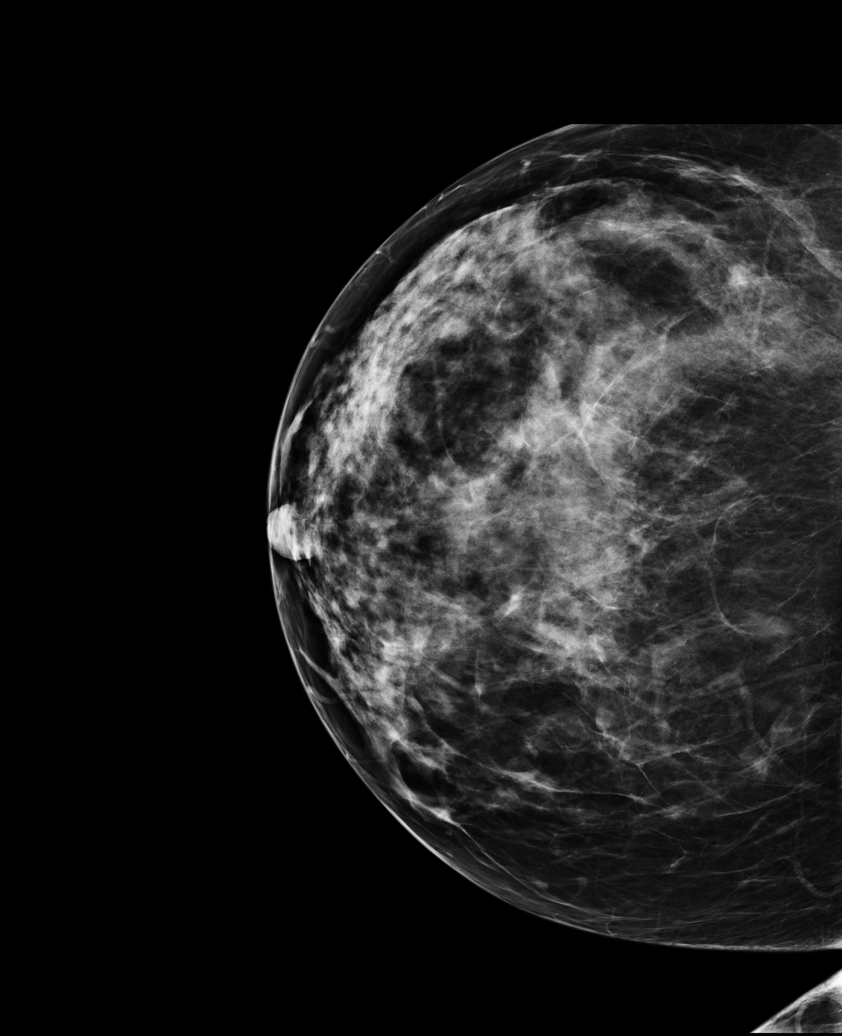

[R MLO]
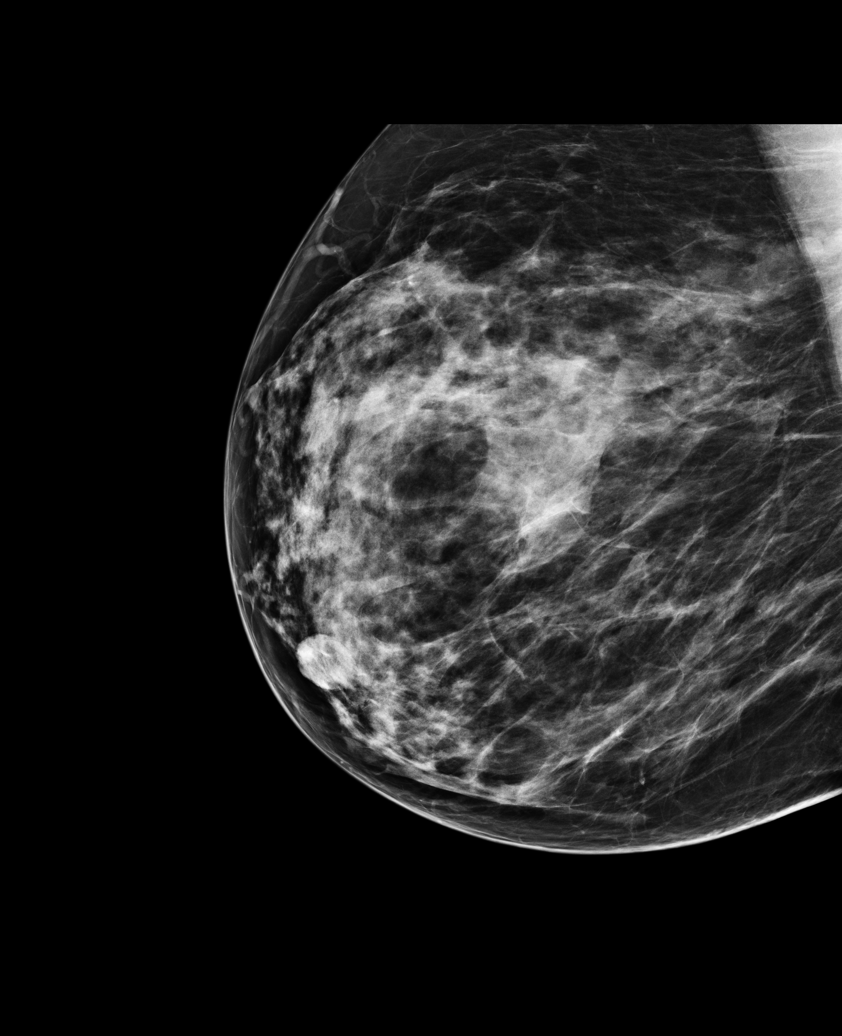

[L MLO (2 of 2)]
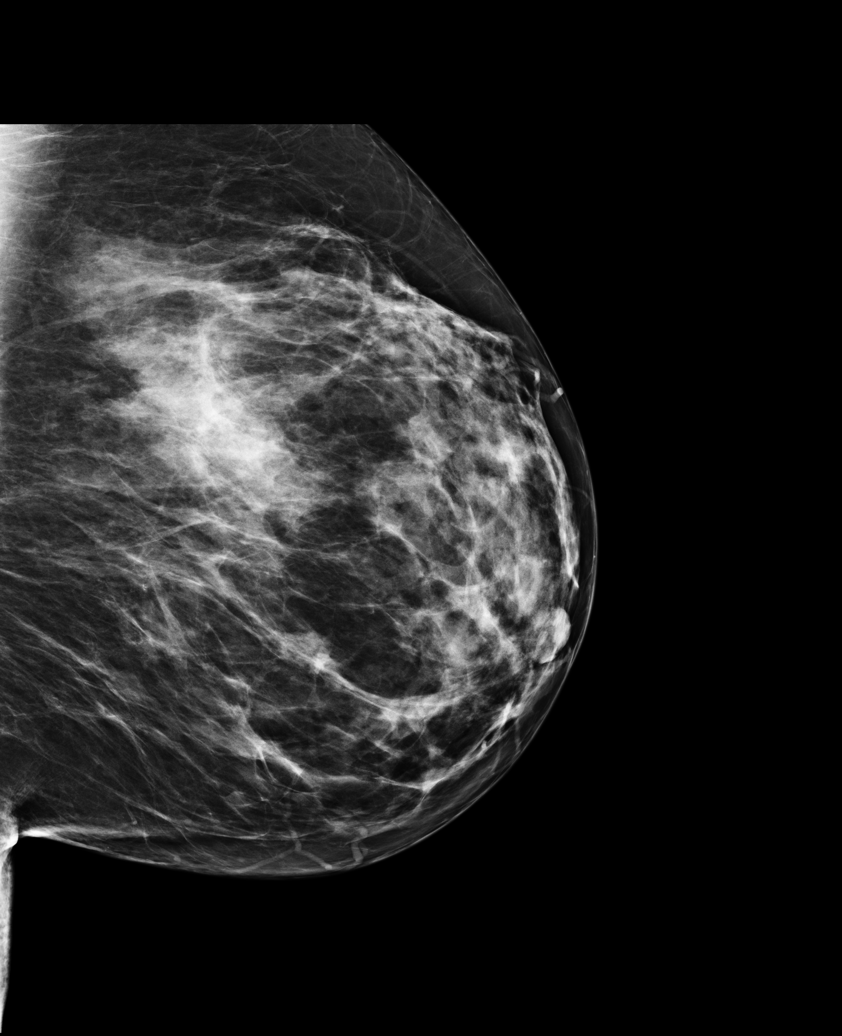

[5 of 5 positions shown; findings below may reference images not displayed]

ACR Breast Density Category c: The breast tissue is heterogeneously
dense, which may obscure small masses.
FINDINGS: In the left breast, calcifications warrant further evaluation with
magnified views. In the right breast, no findings suspicious for
malignancy. Images were processed with CAD.
IMPRESSION: Further evaluation is suggested for calcifications in the left
breast.

RECOMMENDATION:
Diagnostic mammogram of the left breast. (Code:[id number])

The patient will be contacted regarding the findings, and additional
imaging will be scheduled.

BI-RADS CATEGORY  0: Incomplete. Need additional imaging evaluation
and/or prior mammograms for comparison.

## 2015-01-25 ENCOUNTER — Other Ambulatory Visit: Payer: Self-pay | Admitting: Family Medicine

## 2015-01-26 NOTE — Telephone Encounter (Signed)
Refill appropriate and filled per protocol. 

## 2015-02-11 ENCOUNTER — Encounter: Payer: Self-pay | Admitting: Physician Assistant

## 2015-02-11 ENCOUNTER — Ambulatory Visit (INDEPENDENT_AMBULATORY_CARE_PROVIDER_SITE_OTHER): Payer: BLUE CROSS/BLUE SHIELD | Admitting: Physician Assistant

## 2015-02-11 ENCOUNTER — Encounter: Payer: Self-pay | Admitting: Family Medicine

## 2015-02-11 VITALS — BP 130/78 | HR 76 | Temp 99.0°F | Resp 14 | Ht 64.0 in | Wt 230.0 lb

## 2015-02-11 DIAGNOSIS — J029 Acute pharyngitis, unspecified: Secondary | ICD-10-CM

## 2015-02-11 DIAGNOSIS — J020 Streptococcal pharyngitis: Secondary | ICD-10-CM

## 2015-02-11 LAB — RAPID STREP SCREEN (MED CTR MEBANE ONLY): Streptococcus, Group A Screen (Direct): POSITIVE — AB

## 2015-02-11 MED ORDER — HYDROCODONE-ACETAMINOPHEN 7.5-325 MG PO TABS: 1.0000 | ORAL_TABLET | Freq: Four times a day (QID) | ORAL | Status: DC | PRN

## 2015-02-11 MED ORDER — ZOLPIDEM TARTRATE 10 MG PO TABS: ORAL_TABLET | ORAL | Status: DC

## 2015-02-11 MED ORDER — AMOXICILLIN-POT CLAVULANATE 875-125 MG PO TABS: 1.0000 | ORAL_TABLET | Freq: Two times a day (BID) | ORAL | Status: DC

## 2015-02-11 NOTE — Progress Notes (Signed)
Patient ID: Robin Sherman MRN: [id number], DOB: [birth year], 54 y.o. Date of Encounter: 02/11/2015, 9:12 AM    Chief Complaint:  Chief Complaint  Patient presents with  . L Ear Pain    x2 days- pain and congestion to L ear- states that pain radiates down L side of throat and neck- beginning of sore throat     HPI: 54 y.o. year old AA female presents with above symptoms.   States that she has been feeling pain towards her left ear and in her left throat and left neck. It feels sore when she swallows also. Says that she has had no mucus from her nose. Has had no cough. No known fevers or chills.She works at [employer]:   Outpatient Prescriptions Prior to Visit  Medication Sig Dispense Refill  . amLODipine (NORVASC) 10 MG tablet Take 1 tablet (10 mg total) by mouth daily. 30 tablet 0  . beclomethasone (QVAR) 40 MCG/ACT inhaler Inhale 2 puffs into the lungs 2 (two) times daily.    . halobetasol (ULTRAVATE) 0.05 % cream APPLY TO AFFECTED AREA TWICE DAILY. 50 g 3  . hydrochlorothiazide (HYDRODIURIL) 25 MG tablet Take 1 tablet (25 mg total) by mouth daily. 30 tablet 6  . hydrOXYzine (VISTARIL) 50 MG capsule Take 1 capsule (50 mg total) by mouth 3 (three) times daily as needed. 60 capsule 2  . PROAIR HFA 108 (90 BASE) MCG/ACT inhaler INHALE 2 PUFFS INTO THE LUNGS EVERY 4 HOURS AS NEEDED FOR WHEEZING ORSHORTNESS OF BREATH. 8.5 g 11  . Vitamin D, Ergocalciferol, (DRISDOL) 50000 UNITS CAPS capsule Take (1) capsule by mouth every week x12 weeks, then stop. 12 capsule 0  . HYDROcodone-acetaminophen (NORCO) 7.5-325 MG per tablet Take 1 tablet by mouth every 6 (six) hours as needed for moderate pain. 60 tablet 0  . zolpidem (AMBIEN) 10 MG tablet TAKE ONE TABLET BY MOUTH AT BEDTIME AS NEEDED FOR SLEEP. 30 tablet 0   No facility-administered medications prior to visit.    Allergies:  Allergies  Allergen Reactions  . Codeine Hives  . Naproxen Rash    Patient tolerates  Ibuprofen      Review of Systems: See HPI for pertinent ROS. All other ROS negative.    Physical Exam: Blood pressure 130/78, pulse 76, temperature 99 F (37.2 C), temperature source Oral, resp. rate 14, height 5\' 4"  (1.626 m), weight 230 lb (104.327 kg), last menstrual period 04/15/2013., Body mass index is 39.46 kg/(m^2). General: WNWD AAF.  Appears in no acute distress. HEENT: Normocephalic, atraumatic, eyes without discharge, sclera non-icteric, nares are without discharge. Bilateral auditory canals clear, TM's are without perforation, pearly grey and translucent with reflective cone of light bilaterally. Oral cavity moist, posterior pharynx with moderate erythema. No exudate, no peritonsillar abscess.  Neck: Supple. No thyromegaly. Left Anterior Cervical Node severely tender with palpation but minimally enlarged. No other enlarged lymph nodes with palpation.  Lungs: Clear bilaterally to auscultation without wheezes, rales, or rhonchi. Breathing is unlabored. Heart: Regular rhythm. No murmurs, rubs, or gallops. Msk:  Strength and tone normal for age. Extremities/Skin: Warm and dry.  No rashes. Neuro: Alert and oriented X 3. Moves all extremities spontaneously. Gait is normal. CNII-XII grossly in tact. Psych:  Responds to questions appropriately with a normal affect.   Results for orders placed or performed in visit on 02/11/15  Rapid Strep Screen  Result Value Ref Range   Source THROAT    Streptococcus, Group  A Screen (Direct) POS (A) NEGATIVE     ASSESSMENT AND PLAN:  54 y.o. year old female with  1. Strep pharyngitis - amoxicillin-clavulanate (AUGMENTIN) 875-125 MG per tablet; Take 1 tablet by mouth 2 (two) times daily.  Dispense: 20 tablet; Refill: 0  2. Acute pharyngitis, unspecified pharyngitis type - Rapid Strep Screen  Letter given for out of work today and tomorrow. She states that she is then already scheduled to be off work Saturday Sunday Monday. May return to work  Tuesday, August 2. She is to start the anabiotic immediately take as directed and complete all of it. Can use over-the-counter lozenges and spray for pain relief as well as Tylenol Motrin if needed.  Also she is asking for refill on her hydrocodone and Ambien. Hydrocodone was last filled 12/29/14 #60+0. I printed her another prescription for #60+0 today.  Also printed prescription for Ambien No. 30 +2.   Marin Olp [place], [place], [facility] 02/11/2015 9:12 AM

## 2015-02-28 ENCOUNTER — Emergency Department (HOSPITAL_COMMUNITY)
Admission: EM | Admit: 2015-02-28 | Discharge: 2015-02-28 | Disposition: A | Discharge status: Home / Self Care | Payer: BLUE CROSS/BLUE SHIELD | Attending: Emergency Medicine | Admitting: Emergency Medicine

## 2015-02-28 ENCOUNTER — Encounter (HOSPITAL_COMMUNITY): Payer: Self-pay | Admitting: Emergency Medicine

## 2015-02-28 DIAGNOSIS — R42 Dizziness and giddiness: Secondary | ICD-10-CM | POA: Diagnosis not present

## 2015-02-28 DIAGNOSIS — Z872 Personal history of diseases of the skin and subcutaneous tissue: Secondary | ICD-10-CM | POA: Diagnosis not present

## 2015-02-28 DIAGNOSIS — Z91040 Latex allergy status: Secondary | ICD-10-CM | POA: Insufficient documentation

## 2015-02-28 DIAGNOSIS — J45909 Unspecified asthma, uncomplicated: Secondary | ICD-10-CM | POA: Insufficient documentation

## 2015-02-28 DIAGNOSIS — I10 Essential (primary) hypertension: Secondary | ICD-10-CM | POA: Diagnosis not present

## 2015-02-28 DIAGNOSIS — Z7952 Long term (current) use of systemic steroids: Secondary | ICD-10-CM | POA: Insufficient documentation

## 2015-02-28 DIAGNOSIS — Z79899 Other long term (current) drug therapy: Secondary | ICD-10-CM | POA: Diagnosis not present

## 2015-02-28 DIAGNOSIS — Z720 Tobacco use: Secondary | ICD-10-CM | POA: Insufficient documentation

## 2015-02-28 DIAGNOSIS — R590 Localized enlarged lymph nodes: Secondary | ICD-10-CM | POA: Diagnosis not present

## 2015-02-28 DIAGNOSIS — H9202 Otalgia, left ear: Secondary | ICD-10-CM | POA: Diagnosis present

## 2015-02-28 DIAGNOSIS — H6502 Acute serous otitis media, left ear: Secondary | ICD-10-CM

## 2015-02-28 DIAGNOSIS — Z792 Long term (current) use of antibiotics: Secondary | ICD-10-CM | POA: Diagnosis not present

## 2015-02-28 MED ORDER — AMOXICILLIN 500 MG PO CAPS: 500.0000 mg | ORAL_CAPSULE | Freq: Two times a day (BID) | ORAL | Status: DC

## 2015-02-28 NOTE — ED Provider Notes (Signed)
CSN: [id number]     Arrival date & time 02/28/15  1709 History   First MD Initiated Contact with Patient 02/28/15 1806     Chief Complaint  Patient presents with  . Otalgia     (Consider location/radiation/quality/duration/timing/severity/associated sxs/prior Treatment) HPI Comments: 54 year old female with high blood pressure, obesity, tobacco abuse presents with intermittent dizziness worsening left ear pain for the past week. Patient completed anti-biotic for strep throat however pain in the ear worsening. Started after swimming. No drainage from the ear. No neck stiffness or fevers. Patient unsure name of antibody she took. Pain constant ache.  Patient is a 54 y.o. female presenting with ear pain. The history is provided by the patient.  Otalgia Associated symptoms: no abdominal pain, no congestion, no fever, no headaches, no neck pain, no rash and no vomiting     Past Medical History  Diagnosis Date  . Hypertension   . Asthma   . Eczema     Followed by Dr. Nevada Crane dermatology   Past Surgical History  Procedure Laterality Date  . Tubal ligation      [facility]  . Colonoscopy N/A 01/27/2013    Procedure: COLONOSCOPY;  Surgeon: Danie Binder, MD;  Location: [facility];  Service: Endoscopy;  Laterality: N/A;  8:30 AM  . Supracervical abdominal hysterectomy N/A 08/01/2013    Procedure: HYSTERECTOMY SUPRACERVICAL ABDOMINAL;  Surgeon: Jonnie Kind, MD;  Location: [facility] ORS;  Service: Gynecology;  Laterality: N/A;  . Bilateral salpingectomy Bilateral 08/01/2013    Procedure: BILATERAL SALPINGECTOMY;  Surgeon: Jonnie Kind, MD;  Location: [facility] ORS;  Service: Gynecology;  Laterality: Bilateral;  . Hematoma evacuation N/A 08/03/2013    Procedure: EVACUATION PELVIC HEMATOMA;  Surgeon: Jonnie Kind, MD;  Location: [facility] ORS;  Service: Gynecology;  Laterality: N/A;  . Abdominal hysterectomy    . Knee arthroscopy with medial menisectomy Left 01/14/2014    Procedure: KNEE ARTHROSCOPY WITH  PARTIAL MEDIAL MENISECTOMY;  Surgeon: Carole Civil, MD;  Location: [facility] ORS;  Service: Orthopedics;  Laterality: Left;  . Chondroplasty Left 01/14/2014    Procedure: CHONDROPLASTY OF FEMUR;  Surgeon: Carole Civil, MD;  Location: [facility] OR[facility];  Service: Orthopedics;  Laterality: Left;   Family History  Problem Relation Age of Onset  . Heart disease Mother     MI  . Stroke Father   . Heart disease Father     MI  . Cancer Sister     breast  . Colon cancer Neg Hx    Social History  Substance Use Topics  . Smoking status: Current Every Day Smoker -- 0.10 packs/day for 20 years    Types: Cigarettes  . Smokeless tobacco: Never Used  . Alcohol Use: 0.0 oz/week    0 Standard drinks or equivalent per week     Comment: Rare   OB History    No data available     Review of Systems  Constitutional: Negative for fever and chills.  HENT: Positive for ear pain. Negative for congestion.   Eyes: Negative for visual disturbance.  Respiratory: Negative for shortness of breath.   Cardiovascular: Negative for chest pain.  Gastrointestinal: Negative for vomiting and abdominal pain.  Genitourinary: Negative for dysuria and flank pain.  Musculoskeletal: Negative for back pain, neck pain and neck stiffness.  Skin: Negative for rash.  Neurological: Positive for dizziness. Negative for light-headedness and headaches.      Allergies  Codeine; Latex; and Naproxen  Home Medications   Prior to  Admission medications   Medication Sig Start Date End Date Taking? Authorizing Provider  amLODipine (NORVASC) 10 MG tablet Take 1 tablet (10 mg total) by mouth daily. 12/29/14  Yes Alycia Rossetti, MD  halobetasol (ULTRAVATE) 0.05 % cream APPLY TO AFFECTED AREA TWICE DAILY. 01/26/15  Yes Alycia Rossetti, MD  hydrochlorothiazide (HYDRODIURIL) 25 MG tablet Take 1 tablet (25 mg total) by mouth daily. 12/29/14  Yes Alycia Rossetti, MD  HYDROcodone-acetaminophen (NORCO) 7.5-325 MG per tablet Take 1 tablet by  mouth every 6 (six) hours as needed for moderate pain. 02/11/15  Yes Mary B Dixon, PA-C  ibuprofen (ADVIL,MOTRIN) 200 MG tablet Take 400 mg by mouth every 6 (six) hours as needed for mild pain.   Yes Historical Provider, MD  Olive Oil (SWEET OIL) OIL Place 1 drop into the left ear once.   Yes Historical Provider, MD  PROAIR HFA 108 (90 BASE) MCG/ACT inhaler INHALE 2 PUFFS INTO THE LUNGS EVERY 4 HOURS AS NEEDED FOR WHEEZING ORSHORTNESS OF BREATH. 10/07/14  Yes Alycia Rossetti, MD  amoxicillin (AMOXIL) 500 MG capsule Take 1 capsule (500 mg total) by mouth 2 (two) times daily. 02/28/15   Elnora Morrison, MD  amoxicillin-clavulanate (AUGMENTIN) 875-125 MG per tablet Take 1 tablet by mouth 2 (two) times daily. 02/11/15   Lonie Peak Dixon, PA-C  hydrOXYzine (VISTARIL) 50 MG capsule Take 1 capsule (50 mg total) by mouth 3 (three) times daily as needed. 02/02/14   Alycia Rossetti, MD  Vitamin D, Ergocalciferol, (DRISDOL) 50000 UNITS CAPS capsule Take (1) capsule by mouth every week x12 weeks, then stop. 01/01/15   Alycia Rossetti, MD  zolpidem (AMBIEN) 10 MG tablet TAKE ONE TABLET BY MOUTH AT BEDTIME AS NEEDED FOR SLEEP. 02/11/15   Lonie Peak Dixon, PA-C   BP 169/94 mmHg  Pulse 86  Temp(Src) 98.8 F (37.1 C) (Oral)  Resp 18  Ht 5\' 4"  (1.626 m)  Wt 230 lb (104.327 kg)  BMI 39.46 kg/m2  SpO2 99%  LMP 04/15/2013 Physical Exam  Constitutional: She is oriented to person, place, and time. She appears well-developed and well-nourished.  HENT:  Head: Normocephalic and atraumatic.  Patient has left ear effusion and erythema surrounding tympanic membrane no drainage no perforation no mastoid tenderness neck supple  Eyes: Conjunctivae are normal. Right eye exhibits no discharge. Left eye exhibits no discharge.  Neck: Normal range of motion. Neck supple.  Cardiovascular: Normal rate and regular rhythm.   Pulmonary/Chest: Effort normal.  Musculoskeletal: She exhibits no edema.  Lymphadenopathy:    She has cervical  adenopathy (mild leftanterior).  Neurological: She is alert and oriented to person, place, and time.  Skin: Skin is warm. No rash noted.  Psychiatric: She has a normal mood and affect.  Nursing note and vitals reviewed.   ED Course  Procedures (including critical care time) Labs Review Labs Reviewed - No data to display  Imaging Review No results found. I, Mariea Clonts, personally reviewed and evaluated these images and lab results as part of my medical decision-making.   EKG Interpretation None      MDM   Final diagnoses:  Acute serous otitis media of left ear, recurrence not specified   Patient with clinically ear infection, discuss follow-up outpatient and symptomatic treatment.  Results and differential diagnosis were discussed with the patient/parent/guardian. Xrays were independently reviewed by myself.  Close follow up outpatient was discussed, comfortable with the plan.   Medications - No data to display  Filed Vitals:  02/28/15 1714  BP: 169/94  Pulse: 86  Temp: 98.8 F (37.1 C)  TempSrc: Oral  Resp: 18  Height: 5\' 4"  (1.626 m)  Weight: 230 lb (104.327 kg)  SpO2: 99%    Final diagnoses:  Acute serous otitis media of left ear, recurrence not specified        Elnora Morrison, MD 02/28/15 [id number]

## 2015-02-28 NOTE — ED Notes (Signed)
Pt reports left ear pain,intermittent blurred vision,dizziness x2 weeks. Pt reports was diagnosed with strep throat x2 weeks. Pt reports finished abx px but reports no relief. nad noted.

## 2015-02-28 NOTE — Discharge Instructions (Signed)
If you were given medicines take as directed.  If you are on coumadin or contraceptives realize their levels and effectiveness is altered by many different medicines.  If you have any reaction (rash, tongues swelling, other) to the medicines stop taking and see a physician.    If your blood pressure was elevated in the ER make sure you follow up for management with a primary doctor or return for chest pain, shortness of breath or stroke symptoms.  Please follow up as directed and return to the ER or see a physician for new or worsening symptoms.  Thank you. Filed Vitals:   02/28/15 1714  BP: 169/94  Pulse: 86  Temp: 98.8 F (37.1 C)  TempSrc: Oral  Resp: 18  Height: 5\' 4"  (1.626 m)  Weight: 230 lb (104.327 kg)  SpO2: 99%    Otitis Media Otitis media is redness, soreness, and inflammation of the middle ear. Otitis media may be caused by allergies or, most commonly, by infection. Often it occurs as a complication of the common cold. SIGNS AND SYMPTOMS Symptoms of otitis media may include:  Earache.  Fever.  Ringing in your ear.  Headache.  Leakage of fluid from the ear. DIAGNOSIS To diagnose otitis media, your health care provider will examine your ear with an otoscope. This is an instrument that allows your health care provider to see into your ear in order to examine your eardrum. Your health care provider also will ask you questions about your symptoms. TREATMENT  Typically, otitis media resolves on its own within 3-5 days. Your health care provider may prescribe medicine to ease your symptoms of pain. If otitis media does not resolve within 5 days or is recurrent, your health care provider may prescribe antibiotic medicines if he or she suspects that a bacterial infection is the cause. HOME CARE INSTRUCTIONS   If you were prescribed an antibiotic medicine, finish it all even if you start to feel better.  Take medicines only as directed by your health care provider.  Keep  all follow-up visits as directed by your health care provider. SEEK MEDICAL CARE IF:  You have otitis media only in one ear, or bleeding from your nose, or both.  You notice a lump on your neck.  You are not getting better in 3-5 days.  You feel worse instead of better. SEEK IMMEDIATE MEDICAL CARE IF:   You have pain that is not controlled with medicine.  You have swelling, redness, or pain around your ear or stiffness in your neck.  You notice that part of your face is paralyzed.  You notice that the bone behind your ear (mastoid) is tender when you touch it. MAKE SURE YOU:   Understand these instructions.  Will watch your condition.  Will get help right away if you are not doing well or get worse. Document Released: 04/07/2004 Document Revised: 11/17/2013 Document Reviewed: 01/28/2013 [facility] Patient Information 2015 [place], [place]. This information is not intended to replace advice given to you by your health care provider. Make sure you discuss any questions you have with your health care provider.

## 2015-03-01 ENCOUNTER — Other Ambulatory Visit: Payer: Self-pay | Admitting: Family Medicine

## 2015-03-01 ENCOUNTER — Encounter: Payer: Self-pay | Admitting: Family Medicine

## 2015-03-01 ENCOUNTER — Ambulatory Visit (INDEPENDENT_AMBULATORY_CARE_PROVIDER_SITE_OTHER): Payer: BLUE CROSS/BLUE SHIELD | Admitting: Family Medicine

## 2015-03-01 VITALS — BP 130/72 | HR 78 | Temp 98.4°F | Resp 16 | Ht 64.0 in | Wt 226.0 lb

## 2015-03-01 DIAGNOSIS — H66002 Acute suppurative otitis media without spontaneous rupture of ear drum, left ear: Secondary | ICD-10-CM | POA: Diagnosis not present

## 2015-03-01 DIAGNOSIS — I10 Essential (primary) hypertension: Secondary | ICD-10-CM | POA: Diagnosis not present

## 2015-03-01 MED ORDER — HYDROCHLOROTHIAZIDE 25 MG PO TABS: 25.0000 mg | ORAL_TABLET | Freq: Every day | ORAL | Status: DC

## 2015-03-01 MED ORDER — VITAMIN D (ERGOCALCIFEROL) 1.25 MG (50000 UNIT) PO CAPS
ORAL_CAPSULE | ORAL | Status: DC
Start: 2015-03-01 — End: 2016-01-10

## 2015-03-01 MED ORDER — CEFTRIAXONE SODIUM 1 G IJ SOLR
1.0000 g | Freq: Once | INTRAMUSCULAR | Status: AC
  Administered 2015-03-01: 1 g via INTRAMUSCULAR

## 2015-03-01 MED ORDER — AMLODIPINE BESYLATE 10 MG PO TABS: 10.0000 mg | ORAL_TABLET | Freq: Every day | ORAL | Status: DC

## 2015-03-01 NOTE — Patient Instructions (Signed)
Continue the amoxicillin Rocephin shot given F/U as previous

## 2015-03-01 NOTE — Progress Notes (Signed)
Patient ID: Robin Sherman, female   DOB: [birth year], 54 y.o.   MRN: [id number]   Subjective:    Patient ID: Robin Sherman, female    DOB: 04/16/1961, 54 y.o.   MRN: [id number]  Patient presents for L Ear Pain  Pt here with left otitis media. She was seen in the emergency room last night after having a few days of ear pain and dizziness. About 2 weeks ago she was treated for strep pharyngitis. She completed a course of Augmentin she was then seen by her dentist and was put on clindamycin she completed 7 days of this and yesterday she was prescribed amoxicillin. She was told that she had to come in to have her blood pressure medication refilled as well.   Review Of Systems: per above   GEN- denies fatigue,+ fever, weight loss,weakness, recent illness HEENT- denies eye drainage, change in vision, nasal discharge, CVS- denies chest pain, palpitations RESP- denies SOB, cough, wheeze ABD- denies N/V, change in stools, abd pain GU- denies dysuria, hematuria, dribbling, incontinence MSK- denies joint pain, muscle aches, injury Neuro- denies headache,+ dizziness, syncope, seizure activity       Objective:    BP 130/72 mmHg  Pulse 78  Temp(Src) 98.4 F (36.9 C) (Oral)  Resp 16  Ht 5\' 4"  (1.626 m)  Wt 226 lb (102.513 kg)  BMI 38.77 kg/m2  LMP 04/15/2013 GEN- NAD, alert and oriented x3 HEENT- PERRL, EOMI, non injected sclera, pink conjunctiva, MMM, oropharynx clear Right TM clear  no effusion, Left TM injected and tense, +effusion,  no  maxillary sinus tenderness, nares clear  Neck- Supple, shotty left anterior LAD CVS- RRR, no murmur RESP-CTAB Pulses- Radial 2+        Assessment & Plan:      Problem List Items Addressed This Visit    None    Visit Diagnoses    Acute suppurative otitis media of left ear without spontaneous rupture of tympanic membrane, recurrence not specified    -  Primary     she has been on 3 antibodies in the past 3 weeks and still with infection. and then  give her a shot of Rocephin 1 g for the otitis media, she will finish out the amoxicillin    Relevant Medications    cefTRIAXone (ROCEPHIN) injection 1 g (Completed)       Note: This dictation was prepared with Dragon dictation along with smaller phrase technology. Any transcriptional errors that result from this process are unintentional.

## 2015-03-01 NOTE — Assessment & Plan Note (Signed)
BP  Looks okay we called the pharmacy her amlodipine has been refilled she will continue this along with hydrochlorothiazide

## 2015-03-03 ENCOUNTER — Other Ambulatory Visit: Payer: Self-pay | Admitting: Family Medicine

## 2015-03-03 DIAGNOSIS — Z1231 Encounter for screening mammogram for malignant neoplasm of breast: Secondary | ICD-10-CM

## 2015-03-15 ENCOUNTER — Ambulatory Visit (HOSPITAL_COMMUNITY)
Admission: RE | Admit: 2015-03-15 | Discharge: 2015-03-15 | Disposition: A | Discharge status: Home / Self Care | Payer: BLUE CROSS/BLUE SHIELD | Source: Ambulatory Visit | Attending: Family Medicine | Admitting: Family Medicine

## 2015-03-15 DIAGNOSIS — Z1231 Encounter for screening mammogram for malignant neoplasm of breast: Secondary | ICD-10-CM

## 2015-03-17 ENCOUNTER — Other Ambulatory Visit: Payer: Self-pay | Admitting: Family Medicine

## 2015-03-17 DIAGNOSIS — R928 Other abnormal and inconclusive findings on diagnostic imaging of breast: Secondary | ICD-10-CM

## 2015-04-14 ENCOUNTER — Other Ambulatory Visit: Payer: Self-pay | Admitting: Family Medicine

## 2015-04-14 DIAGNOSIS — R928 Other abnormal and inconclusive findings on diagnostic imaging of breast: Secondary | ICD-10-CM

## 2015-05-04 ENCOUNTER — Other Ambulatory Visit: Payer: Self-pay | Admitting: Family Medicine

## 2015-05-04 DIAGNOSIS — R928 Other abnormal and inconclusive findings on diagnostic imaging of breast: Secondary | ICD-10-CM

## 2015-05-18 ENCOUNTER — Inpatient Hospital Stay (HOSPITAL_COMMUNITY): Admission: RE | Admit: 2015-05-18 | Payer: BLUE CROSS/BLUE SHIELD | Source: Ambulatory Visit

## 2015-06-15 ENCOUNTER — Other Ambulatory Visit: Payer: Self-pay | Admitting: Family Medicine

## 2015-06-15 ENCOUNTER — Ambulatory Visit (HOSPITAL_COMMUNITY)
Admission: RE | Admit: 2015-06-15 | Discharge: 2015-06-15 | Disposition: A | Discharge status: Home / Self Care | Payer: BLUE CROSS/BLUE SHIELD | Source: Ambulatory Visit | Attending: Family Medicine | Admitting: Family Medicine

## 2015-06-15 DIAGNOSIS — R928 Other abnormal and inconclusive findings on diagnostic imaging of breast: Secondary | ICD-10-CM

## 2015-06-15 DIAGNOSIS — Z803 Family history of malignant neoplasm of breast: Secondary | ICD-10-CM | POA: Insufficient documentation

## 2015-06-15 DIAGNOSIS — N63 Unspecified lump in breast: Secondary | ICD-10-CM | POA: Insufficient documentation

## 2015-06-21 ENCOUNTER — Other Ambulatory Visit: Payer: Self-pay | Admitting: Family Medicine

## 2015-06-21 DIAGNOSIS — N6320 Unspecified lump in the left breast, unspecified quadrant: Secondary | ICD-10-CM

## 2015-06-29 ENCOUNTER — Other Ambulatory Visit: Payer: Self-pay | Admitting: Family Medicine

## 2015-06-29 ENCOUNTER — Encounter (HOSPITAL_COMMUNITY): Payer: Self-pay

## 2015-06-29 ENCOUNTER — Ambulatory Visit (HOSPITAL_COMMUNITY)
Admission: RE | Admit: 2015-06-29 | Discharge: 2015-06-29 | Disposition: A | Discharge status: Home / Self Care | Payer: BLUE CROSS/BLUE SHIELD | Source: Ambulatory Visit | Attending: Family Medicine | Admitting: Family Medicine

## 2015-06-29 ENCOUNTER — Ambulatory Visit (HOSPITAL_COMMUNITY): Admission: RE | Admit: 2015-06-29 | Payer: BLUE CROSS/BLUE SHIELD | Source: Ambulatory Visit

## 2015-06-29 DIAGNOSIS — N6320 Unspecified lump in the left breast, unspecified quadrant: Secondary | ICD-10-CM

## 2015-06-29 DIAGNOSIS — D242 Benign neoplasm of left breast: Secondary | ICD-10-CM | POA: Insufficient documentation

## 2015-06-29 DIAGNOSIS — N63 Unspecified lump in breast: Secondary | ICD-10-CM | POA: Diagnosis present

## 2015-06-29 HISTORY — PX: BREAST BIOPSY: SHX20

## 2015-06-29 MED ORDER — LIDOCAINE HCL (PF) 1 % IJ SOLN
INTRAMUSCULAR | Status: AC
  Filled 2015-06-29: qty 10

## 2015-07-02 ENCOUNTER — Encounter: Payer: Self-pay | Admitting: Family Medicine

## 2015-07-02 ENCOUNTER — Ambulatory Visit (INDEPENDENT_AMBULATORY_CARE_PROVIDER_SITE_OTHER): Payer: BLUE CROSS/BLUE SHIELD | Admitting: Family Medicine

## 2015-07-02 VITALS — BP 130/72 | HR 80 | Temp 98.5°F | Resp 14 | Ht 64.0 in | Wt 232.0 lb

## 2015-07-02 DIAGNOSIS — M545 Low back pain, unspecified: Secondary | ICD-10-CM | POA: Insufficient documentation

## 2015-07-02 DIAGNOSIS — E669 Obesity, unspecified: Secondary | ICD-10-CM

## 2015-07-02 DIAGNOSIS — Z720 Tobacco use: Secondary | ICD-10-CM | POA: Diagnosis not present

## 2015-07-02 DIAGNOSIS — E559 Vitamin D deficiency, unspecified: Secondary | ICD-10-CM | POA: Diagnosis not present

## 2015-07-02 DIAGNOSIS — R7303 Prediabetes: Secondary | ICD-10-CM

## 2015-07-02 DIAGNOSIS — J209 Acute bronchitis, unspecified: Secondary | ICD-10-CM | POA: Diagnosis not present

## 2015-07-02 DIAGNOSIS — G4700 Insomnia, unspecified: Secondary | ICD-10-CM | POA: Diagnosis not present

## 2015-07-02 DIAGNOSIS — I10 Essential (primary) hypertension: Secondary | ICD-10-CM

## 2015-07-02 LAB — CBC WITH DIFFERENTIAL/PLATELET
BASOS PCT: 1 % (ref 0–1)
Basophils Absolute: 0.1 10*3/uL (ref 0.0–0.1)
EOS ABS: 0.5 10*3/uL (ref 0.0–0.7)
Eosinophils Relative: 5 % (ref 0–5)
HCT: 39.4 % (ref 36.0–46.0)
HEMOGLOBIN: 13 g/dL (ref 12.0–15.0)
LYMPHS ABS: 3.1 10*3/uL (ref 0.7–4.0)
Lymphocytes Relative: 31 % (ref 12–46)
MCH: 27.7 pg (ref 26.0–34.0)
MCHC: 33 g/dL (ref 30.0–36.0)
MCV: 83.8 fL (ref 78.0–100.0)
MONO ABS: 0.6 10*3/uL (ref 0.1–1.0)
MONOS PCT: 6 % (ref 3–12)
MPV: 10.1 fL (ref 8.6–12.4)
Neutro Abs: 5.7 10*3/uL (ref 1.7–7.7)
Neutrophils Relative %: 57 % (ref 43–77)
Platelets: 415 10*3/uL — ABNORMAL HIGH (ref 150–400)
RBC: 4.7 MIL/uL (ref 3.87–5.11)
RDW: 14.5 % (ref 11.5–15.5)
WBC: 10 10*3/uL (ref 4.0–10.5)

## 2015-07-02 LAB — BASIC METABOLIC PANEL
BUN: 14 mg/dL (ref 7–25)
CALCIUM: 9.6 mg/dL (ref 8.6–10.4)
CO2: 24 mmol/L (ref 20–31)
CREATININE: 0.7 mg/dL (ref 0.50–1.05)
Chloride: 99 mmol/L (ref 98–110)
GLUCOSE: 132 mg/dL — AB (ref 70–99)
Potassium: 4.3 mmol/L (ref 3.5–5.3)
SODIUM: 137 mmol/L (ref 135–146)

## 2015-07-02 MED ORDER — HYDROCODONE-ACETAMINOPHEN 7.5-325 MG PO TABS: 1.0000 | ORAL_TABLET | Freq: Four times a day (QID) | ORAL | Status: DC | PRN

## 2015-07-02 MED ORDER — METHYLPREDNISOLONE 4 MG PO TBPK: ORAL_TABLET | ORAL | Status: DC

## 2015-07-02 MED ORDER — AZITHROMYCIN 250 MG PO TABS: ORAL_TABLET | ORAL | Status: DC

## 2015-07-02 MED ORDER — ZOLPIDEM TARTRATE 10 MG PO TABS: ORAL_TABLET | ORAL | Status: DC

## 2015-07-02 NOTE — Assessment & Plan Note (Signed)
Well controlled no change to meds 

## 2015-07-02 NOTE — Progress Notes (Signed)
Patient ID: Robin Sherman, female   DOB: 1961/05/29, 54 y.o.   MRN: [id number]   Subjective:    Patient ID: Robin Sherman, female    DOB: 03/09/1961, 54 y.o.   MRN: [id number]  Patient presents for FOLLOW UP; LUMBAR PAIN; and L EAR PAIN  Pt here with cough with congestion, sinus drainage, left ear pain for past 2 weeks. Has had wheezing as well worse at night. Using OTC cough medicine, continues to smoke daily.   Low back pain for past few days, works as Quarry manager, does a lot of bending, lifiting, also has DJD of knee which continues to cause problems, but she does not want any further intervention at this time.    Borderline DM- A1C 6.3% last visit, weight up 5lbs, does not watch diet, very little activity outside of work    Review Of Systems:  GEN- denies fatigue, fever, weight loss,weakness, recent illness HEENT- denies eye drainage, change in vision,+ nasal discharge, CVS- denies chest pain, palpitations RESP- denies SOB,+ cough, wheeze ABD- denies N/V, change in stools, abd pain GU- denies dysuria, hematuria, dribbling, incontinence MSK- +joint pain, muscle aches, injury Neuro- denies headache, dizziness, syncope, seizure activity       Objective:    BP 130/72 mmHg  Pulse 80  Temp(Src) 98.5 F (36.9 C) (Oral)  Resp 14  Ht 5\' 4"  (1.626 m)  Wt 232 lb (105.235 kg)  BMI 39.80 kg/m2  LMP 04/15/2013 GEN- NAD, alert and oriented x3 HEENT- PERRL, EOMI, non injected sclera, pink conjunctiva, MMM, oropharynx clear, nares clear rhinorrhea Neck- Supple, no LAD CVS- RRR, no murmur RESP-occasional wheeze, mild congestion, no rales, normal WOB  MSK- Lumbar spine NT, TTP paraspinals, mild spasm, neg SLR, decreased ROM  Neuro- strength equal bilat, normal tone LE, sensation grossly in tact  EXT- No edema Pulses- Radial  2+        Assessment & Plan:      Problem List Items Addressed This Visit    Vitamin D deficiency   Relevant Orders   Vitamin D, 25-hydroxy   Tobacco user   Obesity   Lumbar back pain   Relevant Medications   methylPREDNISolone (MEDROL DOSEPAK) 4 MG TBPK tablet   HYDROcodone-acetaminophen (NORCO) 7.5-325 MG tablet   Insomnia   Essential hypertension, benign - Primary   Relevant Orders   CBC with Differential/Platelet   Basic metabolic panel   Borderline diabetes   Relevant Orders   Hemoglobin A1c    Other Visit Diagnoses    Acute bronchitis, unspecified organism        zPAK, SMOKER, steroids,add mucinex DM, continue albuterol       Note: This dictation was prepared with Dragon dictation along with smaller phrase technology. Any transcriptional errors that result from this process are unintentional.

## 2015-07-02 NOTE — Assessment & Plan Note (Signed)
MSK pain, heating pad, painmeds refilled for this and her knee, medrol dosepak given

## 2015-07-02 NOTE — Assessment & Plan Note (Signed)
Doing well on ambien.   

## 2015-07-02 NOTE — Patient Instructions (Signed)
Take antibiotics, steroids Mucinex DM  Pain medication We will call with lab results F/U 4 months

## 2015-07-02 NOTE — Assessment & Plan Note (Signed)
Recheck A1C discussed dietary changes which will also help her back pain and knee pain with weight loss

## 2015-07-03 LAB — VITAMIN D 25 HYDROXY (VIT D DEFICIENCY, FRACTURES): Vit D, 25-Hydroxy: 38 ng/mL (ref 30–100)

## 2015-07-03 LAB — HEMOGLOBIN A1C
HEMOGLOBIN A1C: 6.2 % — AB (ref <5.7)
Mean Plasma Glucose: 131 mg/dL — ABNORMAL HIGH (ref <117)

## 2015-09-14 ENCOUNTER — Emergency Department (HOSPITAL_COMMUNITY): Payer: BLUE CROSS/BLUE SHIELD

## 2015-09-14 ENCOUNTER — Encounter (HOSPITAL_COMMUNITY): Payer: Self-pay

## 2015-09-14 ENCOUNTER — Emergency Department (HOSPITAL_COMMUNITY)
Admission: EM | Admit: 2015-09-14 | Discharge: 2015-09-14 | Disposition: A | Discharge status: Home / Self Care | Payer: BLUE CROSS/BLUE SHIELD | Attending: Emergency Medicine | Admitting: Emergency Medicine

## 2015-09-14 DIAGNOSIS — I10 Essential (primary) hypertension: Secondary | ICD-10-CM | POA: Diagnosis not present

## 2015-09-14 DIAGNOSIS — F17210 Nicotine dependence, cigarettes, uncomplicated: Secondary | ICD-10-CM | POA: Diagnosis not present

## 2015-09-14 DIAGNOSIS — M7731 Calcaneal spur, right foot: Secondary | ICD-10-CM | POA: Diagnosis not present

## 2015-09-14 DIAGNOSIS — J45909 Unspecified asthma, uncomplicated: Secondary | ICD-10-CM | POA: Insufficient documentation

## 2015-09-14 DIAGNOSIS — M79671 Pain in right foot: Secondary | ICD-10-CM | POA: Diagnosis present

## 2015-09-14 DIAGNOSIS — Z79899 Other long term (current) drug therapy: Secondary | ICD-10-CM | POA: Diagnosis not present

## 2015-09-14 DIAGNOSIS — Z91040 Latex allergy status: Secondary | ICD-10-CM | POA: Insufficient documentation

## 2015-09-14 DIAGNOSIS — Z872 Personal history of diseases of the skin and subcutaneous tissue: Secondary | ICD-10-CM | POA: Insufficient documentation

## 2015-09-14 MED ORDER — PREDNISONE 10 MG PO TABS: ORAL_TABLET | ORAL | Status: DC

## 2015-09-14 MED ORDER — TRAMADOL HCL 50 MG PO TABS: 50.0000 mg | ORAL_TABLET | Freq: Four times a day (QID) | ORAL | Status: DC | PRN

## 2015-09-14 NOTE — ED Provider Notes (Signed)
CSN: [id number]     Arrival date & time 09/14/15  1552 History   First MD Initiated Contact with Patient 09/14/15 1606     Chief Complaint  Patient presents with  . Foot Pain     (Consider location/radiation/quality/duration/timing/severity/associated sxs/prior Treatment) The history is provided by the patient.   Robin Sherman is a 55 y.o. 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  She denies injury. She endorses radiating pain to her right lateral hip (no hip pain at rest, but has constant mild ache in heel at rest).  She also endorses feeling like she is stepping on a pebble in her shoe. She has used ibuprofen and has been using a cane to help minimize weight on the foot with minimal improvement.     Past Medical History  Diagnosis Date  . Hypertension   . Asthma   . Eczema     Followed by Dr. Nevada Crane dermatology   Past Surgical History  Procedure Laterality Date  . Tubal ligation      [facility]  . Colonoscopy N/A 01/27/2013    Procedure: COLONOSCOPY;  Surgeon: Danie Binder, MD;  Location: [facility];  Service: Endoscopy;  Laterality: N/A;  8:30 AM  . Supracervical abdominal hysterectomy N/A 08/01/2013    Procedure: HYSTERECTOMY SUPRACERVICAL ABDOMINAL;  Surgeon: Jonnie Kind, MD;  Location: [facility] ORS;  Service: Gynecology;  Laterality: N/A;  . Bilateral salpingectomy Bilateral 08/01/2013    Procedure: BILATERAL SALPINGECTOMY;  Surgeon: Jonnie Kind, MD;  Location: [facility];  Service: Gynecology;  Laterality: Bilateral;  . Hematoma evacuation N/A 08/03/2013    Procedure: EVACUATION PELVIC HEMATOMA;  Surgeon: Jonnie Kind, MD;  Location: [facility];  Service: Gynecology;  Laterality: N/A;  . Abdominal hysterectomy    . Knee arthroscopy with medial menisectomy Left 01/14/2014    Procedure:  KNEE ARTHROSCOPY WITH PARTIAL MEDIAL MENISECTOMY;  Surgeon: Carole Civil, MD;  Location: [facility];  Service: Orthopedics;  Laterality: Left;  . Chondroplasty Left 01/14/2014    Procedure: CHONDROPLASTY OF FEMUR;  Surgeon: Carole Civil, MD;  Location: [facility];  Service: Orthopedics;  Laterality: Left;   Family History  Problem Relation Age of Onset  . Heart disease Mother     MI  . Stroke Father   . Heart disease Father     MI  . Cancer Sister     breast  . Colon cancer Neg Hx    Social History  Substance Use Topics  . Smoking status: Current Every Day Smoker -- 0.50 packs/day for 20 years    Types: Cigarettes  . Smokeless tobacco: Never Used  . Alcohol Use: 0.0 oz/week    0 Standard drinks or equivalent per week     Comment: Rare   OB History    No data available     Review of Systems  Constitutional: Negative for fever.  Musculoskeletal: Positive for arthralgias. Negative for myalgias and joint swelling.  Neurological: Negative for weakness and numbness.      Allergies  Codeine; Latex; and Naproxen  Home Medications   Prior to Admission medications   Medication Sig Start Date End Date Taking? Authorizing Provider  amLODipine (NORVASC) 10 MG tablet Take 1 tablet (10 mg total) by mouth daily. 03/01/15   Lonell Grandchild  Donato Schultz, MD  azithromycin (ZITHROMAX) 250 MG tablet Take 2 tablets x 1 day, then 1 tab daily for 4 days 07/02/15   Alycia Rossetti, MD  halobetasol (ULTRAVATE) 0.05 % cream APPLY TO AFFECTED AREA TWICE DAILY. 01/26/15   Alycia Rossetti, MD  hydrochlorothiazide (HYDRODIURIL) 25 MG tablet Take 1 tablet (25 mg total) by mouth daily. 03/01/15   Alycia Rossetti, MD  HYDROcodone-acetaminophen (NORCO) 7.5-325 MG tablet Take 1 tablet by mouth every 6 (six) hours as needed for moderate pain. 07/02/15   Alycia Rossetti, MD  hydrOXYzine (VISTARIL) 50 MG capsule Take 1 capsule (50 mg total) by mouth 3 (three) times daily as needed. 02/02/14   Alycia Rossetti, MD   ibuprofen (ADVIL,MOTRIN) 200 MG tablet Take 400 mg by mouth every 6 (six) hours as needed for mild pain.    Historical Provider, MD  methylPREDNISolone (MEDROL DOSEPAK) 4 MG TBPK tablet Take as directed on package 07/02/15   Alycia Rossetti, MD  Olive Oil (SWEET OIL) OIL Place 1 drop into the left ear once.    Historical Provider, MD  predniSONE (DELTASONE) 10 MG tablet 6, 5, 4, 3, 2 then 1 tablet by mouth daily for 6 days total. 09/14/15   Evalee Jefferson, PA-C  PROAIR HFA 108 (90 BASE) MCG/ACT inhaler INHALE 2 PUFFS INTO THE LUNGS EVERY 4 HOURS AS NEEDED FOR WHEEZING ORSHORTNESS OF BREATH. 10/07/14   Alycia Rossetti, MD  traMADol (ULTRAM) 50 MG tablet Take 1 tablet (50 mg total) by mouth every 6 (six) hours as needed. 09/14/15   Evalee Jefferson, PA-C  Vitamin D, Ergocalciferol, (DRISDOL) 50000 UNITS CAPS capsule Take (1) capsule by mouth every week x12 weeks, then stop. 03/01/15   Alycia Rossetti, MD  zolpidem (AMBIEN) 10 MG tablet TAKE ONE TABLET BY MOUTH AT BEDTIME AS NEEDED FOR SLEEP. 07/02/15   Alycia Rossetti, MD   BP 109/71 mmHg  Pulse 95  Temp(Src) 98.6 F (37 C) (Oral)  Resp 18  Ht 5\' 4"  (1.626 m)  Wt 103.874 kg  BMI 39.29 kg/m2  SpO2 99%  LMP 04/15/2013 Physical Exam  Constitutional: She appears well-developed and well-nourished.  HENT:  Head: Atraumatic.  Neck: Normal range of motion.  Cardiovascular:  Pulses equal bilaterally  Musculoskeletal: She exhibits tenderness. She exhibits no edema.       Right foot: There is bony tenderness. There is normal range of motion, no swelling, no crepitus and no deformity.  ttp right heel at plantar fascia insertion site.  No palpable lesions or nodules.  Achilles tendon intact and nontender.  No pain in remainder of foot.  Skin intact.  Less than 2 sec distal cap refill and full dorsalis pedal pulse.  Neurological: She is alert. She has normal strength. She displays normal reflexes. No sensory deficit.  Skin: Skin is warm and dry.  Psychiatric:  She has a normal mood and affect.    ED Course  Procedures (including critical care time) Labs Review Labs Reviewed - No data to display  Imaging Review Dg Foot Complete Right  09/14/2015  CLINICAL DATA:  RIGHT heel pain since Thursday, no known injury, initial encounter EXAM: RIGHT FOOT COMPLETE - 3+ VIEW COMPARISON:  None FINDINGS: Osseous mineralization normal for technique. Joint spaces preserved. Moderate-sized plantar calcaneal spur. No acute fracture, dislocation or bone destruction. IMPRESSION: Moderate-sized plantar calcaneal spur. No acute bony abnormalities. Electronically Signed   By: Lavonia Dana M.D.   On: 09/14/2015 16:34   I  have personally reviewed and evaluated these images and lab results as part of my medical decision-making.   EKG Interpretation None      MDM   Final diagnoses:  Heel spur, right    Tramadol, prednisone taper. Discussed shoe insert with heel cut out.  F/u with Dr. Aline Brochure, pt to call for appt.    Evalee Jefferson, PA-C 09/14/15 1654  Fredia Sorrow, MD 09/17/15 1622

## 2015-09-14 NOTE — Discharge Instructions (Signed)
Heel Spur A heel spur is a bony growth that forms on the bottom of your heel bone (calcaneus). Heel spurs are common and do not always cause pain. However, heel spurs often cause inflammation in the strong band of tissue that runs underneath the bone of your foot (plantar fascia). When this happens, you may feel pain on the bottom of your foot, near your heel.  CAUSES  The cause of heel spurs is not completely understood. They may be caused by pressure on the heel. Or, they may stem from the muscle attachments (tendons) near the spur pulling on the heel.  RISK FACTORS You may be at risk for a heel spur if you:  Are older than 40.  Are overweight.  Have wear and tear arthritis (osteoarthritis).  Have plantar fascia inflammation. SIGNS AND SYMPTOMS  Some people have heel spurs but no symptoms. If you do have symptoms, they may include:   Pain in the bottom of your heel.  Pain that is worse when you first get out of bed.  Pain that gets worse after walking or standing. DIAGNOSIS  Your health care provider may diagnose a heel spur based on your symptoms and a physical exam. You may also have an X-ray of your foot to check for a bony growth coming from the calcaneus.  TREATMENT Treatment aims to relieve the pain from the heel spur. This may include:  Stretching exercises.  Losing weight.  Wearing specific shoes, inserts, or orthotics for comfort and support.  Wearing splints at night to properly position your feet.  Taking over-the-counter medicine to relieve pain.  Being treated with high-intensity sound waves to break up the heel spur (extracorporeal shock wave therapy).  Getting steroid injections in your heel to reduce swelling and ease pain.  Having surgery if your heel spur causes long-term (chronic) pain. HOME CARE INSTRUCTIONS   Take medicines only as directed by your health care provider.  Ask your health care provider if you should use ice or cold packs on the  painful areas of your heel or foot.  Avoid activities that cause you pain until you recover or as directed by your health care provider.  Stretch before exercising or being physically active.  Wear supportive shoes that fit well as directed by your health care provider. You might need to buy new shoes. Wearing old shoes or shoes that do not fit correctly may not provide the support that you need.  Lose weight if your health care provider thinks you should. This can relieve pressure on your foot that may be causing pain and discomfort. SEEK MEDICAL CARE IF:   Your pain continues or gets worse.   This information is not intended to replace advice given to you by your health care provider. Make sure you discuss any questions you have with your health care provider.   Document Released: 08/09/2005 Document Revised: 07/24/2014 Document Reviewed: 09/03/2013 Elsevier Interactive Patient Education 2016 [employer].   Use the tramadol if needed for pain relief.  Take the prednisone taper in place of your ibuprofen for the next 6 days. As discussed, placing a cushion in your shoe designed for heel spurs will help take the pressure off this site when you are standing.

## 2015-09-14 NOTE — ED Notes (Signed)
Reports of pain in right heel that radiates into right hip x4 days. Denies injury. Ambulatory to triage.

## 2015-10-07 ENCOUNTER — Other Ambulatory Visit: Payer: Self-pay | Admitting: Family Medicine

## 2015-10-08 NOTE — Telephone Encounter (Signed)
Refill appropriate and filled per protocol. 

## 2015-10-13 ENCOUNTER — Encounter: Payer: Self-pay | Admitting: Family Medicine

## 2015-10-14 ENCOUNTER — Emergency Department (HOSPITAL_COMMUNITY)
Admission: EM | Admit: 2015-10-14 | Discharge: 2015-10-14 | Discharge status: Against Medical Advice | Payer: BLUE CROSS/BLUE SHIELD | Source: Home / Self Care

## 2015-10-14 ENCOUNTER — Encounter (HOSPITAL_COMMUNITY): Payer: Self-pay | Admitting: Emergency Medicine

## 2015-10-14 ENCOUNTER — Emergency Department (HOSPITAL_COMMUNITY)
Admission: EM | Admit: 2015-10-14 | Discharge: 2015-10-14 | Disposition: A | Discharge status: Home / Self Care | Payer: BLUE CROSS/BLUE SHIELD | Attending: Emergency Medicine | Admitting: Emergency Medicine

## 2015-10-14 DIAGNOSIS — J069 Acute upper respiratory infection, unspecified: Secondary | ICD-10-CM | POA: Diagnosis not present

## 2015-10-14 DIAGNOSIS — J45909 Unspecified asthma, uncomplicated: Secondary | ICD-10-CM | POA: Insufficient documentation

## 2015-10-14 DIAGNOSIS — Z87891 Personal history of nicotine dependence: Secondary | ICD-10-CM | POA: Insufficient documentation

## 2015-10-14 DIAGNOSIS — R51 Headache: Secondary | ICD-10-CM | POA: Diagnosis not present

## 2015-10-14 DIAGNOSIS — I10 Essential (primary) hypertension: Secondary | ICD-10-CM | POA: Diagnosis not present

## 2015-10-14 DIAGNOSIS — B9789 Other viral agents as the cause of diseases classified elsewhere: Secondary | ICD-10-CM

## 2015-10-14 DIAGNOSIS — J988 Other specified respiratory disorders: Secondary | ICD-10-CM

## 2015-10-14 DIAGNOSIS — R52 Pain, unspecified: Secondary | ICD-10-CM | POA: Diagnosis present

## 2015-10-14 NOTE — Discharge Instructions (Signed)
Your examination is consistent with a viral illness. Please wash hands frequently. Please use a mask until symptoms have resolved. It is important that you get lots of rest. Please increase water, juices, Gatorade, etc. Viral Infections A virus is a type of germ. Viruses can cause:  Minor sore throats.  Aches and pains.  Headaches.  Runny nose.  Rashes.  Watery eyes.  Tiredness.  Coughs.  Loss of appetite.  Feeling sick to your stomach (nausea).  Throwing up (vomiting).  Watery poop (diarrhea). HOME CARE   Only take medicines as told by your doctor.  Drink enough water and fluids to keep your pee (urine) clear or pale yellow. Sports drinks are a good choice.  Get plenty of rest and eat healthy. Soups and broths with crackers or rice are fine. GET HELP RIGHT AWAY IF:   You have a very bad headache.  You have shortness of breath.  You have chest pain or neck pain.  You have an unusual rash.  You cannot stop throwing up.  You have watery poop that does not stop.  You cannot keep fluids down.  You or your child has a temperature by mouth above 102 F (38.9 C), not controlled by medicine.  Your baby is older than 3 months with a rectal temperature of 102 F (38.9 C) or higher.  Your baby is 46 months old or younger with a rectal temperature of 100.4 F (38 C) or higher. MAKE SURE YOU:   Understand these instructions.  Will watch this condition.  Will get help right away if you are not doing well or get worse.   This information is not intended to replace advice given to you by your health care provider. Make sure you discuss any questions you have with your health care provider.   Document Released: 06/15/2008 Document Revised: 09/25/2011 Document Reviewed: 12/09/2014 Elsevier Interactive Patient Education [employer].

## 2015-10-14 NOTE — ED Notes (Signed)
Pt reports generalized body aches, cough,sore throat intermittently for last several weeks. Pt denies any known fevers, v/d.

## 2015-10-14 NOTE — ED Provider Notes (Signed)
CSN: [id number]     Arrival date & time 10/14/15  0914 History   First MD Initiated Contact with Patient 10/14/15 [phone]     Chief Complaint  Patient presents with  . Generalized Body Aches     (Consider location/radiation/quality/duration/timing/severity/associated sxs/prior Treatment) Patient is a 55 y.o. female presenting with URI. The history is provided by the patient.  URI Presenting symptoms: congestion, cough and sore throat   Duration:  2 days Timing:  Intermittent Progression:  Worsening Chronicity:  New Relieved by:  Nothing Ineffective treatments:  OTC medications Associated symptoms: headaches, myalgias and sneezing   Risk factors: sick contacts   Risk factors: no chronic cardiac disease, no chronic kidney disease, no diabetes mellitus and no recent travel     Past Medical History  Diagnosis Date  . Hypertension   . Asthma   . Eczema     Followed by Dr. Nevada Crane dermatology   Past Surgical History  Procedure Laterality Date  . Tubal ligation      [facility]  . Colonoscopy N/A 01/27/2013    Procedure: COLONOSCOPY;  Surgeon: Danie Binder, MD;  Location[facility];  Service: Endoscopy;  Laterality: N/A;  8:30 AM  . Supracervical abdominal hysterectomy N/A 08/01/2013    Procedure: HYSTERECTOMY SUPRACERVICAL ABDOMINAL;  Surgeon: Jonnie Kind, MD;  Location: [facility];  Service: Gynecology;  Laterality: N/A;  . Bilateral salpingectomy Bilateral 08/01/2013    Procedure: BILATERAL SALPINGECTOMY;  Surgeon: Jonnie Kind, MD;  Location: [facility];  Service: Gynecology;  Laterality: Bilateral;  . Hematoma evacuation N/A 08/03/2013    Procedure: EVACUATION PELVIC HEMATOMA;  Surgeon: Jonnie Kind, MD;  Location: [facility];  Service: Gynecology;  Laterality: N/A;  . Abdominal hysterectomy    . Knee arthroscopy with medial menisectomy Left 01/14/2014    Procedure: KNEE ARTHROSCOPY WITH PARTIAL MEDIAL MENISECTOMY;  Surgeon: Carole Civil, MD;  Location: [facility];  Service:  Orthopedics;  Laterality: Left;  . Chondroplasty Left 01/14/2014    Procedure: CHONDROPLASTY OF FEMUR;  Surgeon: Carole Civil, MD;  Location: [facility];  Service: Orthopedics;  Laterality: Left;   Family History  Problem Relation Age of Onset  . Heart disease Mother     MI  . Stroke Father   . Heart disease Father     MI  . Cancer Sister     breast  . Colon cancer Neg Hx    Social History  Substance Use Topics  . Smoking status: Former Smoker -- 0.50 packs/day for 20 years    Types: Cigarettes  . Smokeless tobacco: Never Used  . Alcohol Use: 0.0 oz/week    0 Standard drinks or equivalent per week     Comment: Rare   OB History    No data available     Review of Systems  HENT: Positive for congestion, sneezing and sore throat.   Respiratory: Positive for cough.   Musculoskeletal: Positive for myalgias.  Neurological: Positive for headaches.  All other systems reviewed and are negative.     Allergies  Codeine; Latex; and Naproxen  Home Medications   Prior to Admission medications   Medication Sig Start Date End Date Taking? Authorizing Provider  amLODipine (NORVASC) 10 MG tablet TAKE 1 TABLET BY MOUTH ONCE DAILY. 10/08/15   Alycia Rossetti, MD  azithromycin (ZITHROMAX) 250 MG tablet Take 2 tablets x 1 day, then 1 tab daily for 4 days 07/02/15   Alycia Rossetti, MD  halobetasol (ULTRAVATE) 0.05 %  cream APPLY TO AFFECTED AREA TWICE DAILY. 01/26/15   Alycia Rossetti, MD  hydrochlorothiazide (HYDRODIURIL) 25 MG tablet TAKE ONE TABLET BY MOUTH ONCE DAILY. 10/08/15   Alycia Rossetti, MD  HYDROcodone-acetaminophen (NORCO) 7.5-325 MG tablet Take 1 tablet by mouth every 6 (six) hours as needed for moderate pain. 07/02/15   Alycia Rossetti, MD  hydrOXYzine (VISTARIL) 50 MG capsule Take 1 capsule (50 mg total) by mouth 3 (three) times daily as needed. 02/02/14   Alycia Rossetti, MD  ibuprofen (ADVIL,MOTRIN) 200 MG tablet Take 400 mg by mouth every 6 (six) hours as needed for  mild pain.    Historical Provider, MD  methylPREDNISolone (MEDROL DOSEPAK) 4 MG TBPK tablet Take as directed on package 07/02/15   Alycia Rossetti, MD  Olive Oil (SWEET OIL) OIL Place 1 drop into the left ear once.    Historical Provider, MD  predniSONE (DELTASONE) 10 MG tablet 6, 5, 4, 3, 2 then 1 tablet by mouth daily for 6 days total. 09/14/15   Evalee Jefferson, PA-C  PROAIR HFA 108 (90 BASE) MCG/ACT inhaler INHALE 2 PUFFS INTO THE LUNGS EVERY 4 HOURS AS NEEDED FOR WHEEZING ORSHORTNESS OF BREATH. 10/07/14   Alycia Rossetti, MD  traMADol (ULTRAM) 50 MG tablet Take 1 tablet (50 mg total) by mouth every 6 (six) hours as needed. 09/14/15   Evalee Jefferson, PA-C  Vitamin D, Ergocalciferol, (DRISDOL) 50000 UNITS CAPS capsule Take (1) capsule by mouth every week x12 weeks, then stop. 03/01/15   Alycia Rossetti, MD  zolpidem (AMBIEN) 10 MG tablet TAKE ONE TABLET BY MOUTH AT BEDTIME AS NEEDED FOR SLEEP. 07/02/15   Alycia Rossetti, MD   BP 125/85 mmHg  Pulse 106  Temp(Src) 98.9 F (37.2 C) (Oral)  Resp 18  Ht 5\' 4"  (1.626 m)  Wt 99.791 kg  BMI 37.74 kg/m2  SpO2 97%  LMP 04/15/2013 Physical Exam  Constitutional: She is oriented to person, place, and time. She appears well-developed and well-nourished.  Non-toxic appearance.  HENT:  Head: Normocephalic.  Right Ear: Tympanic membrane and external ear normal.  Left Ear: Tympanic membrane and external ear normal.  Minimal redness of the posterior pharynx Nasal congestion present.  Eyes: EOM and lids are normal. Pupils are equal, round, and reactive to light.  Neck: Normal range of motion. Neck supple. Carotid bruit is not present.  Cardiovascular: Regular rhythm, normal heart sounds, intact distal pulses and normal pulses.  Tachycardia present.   Pulmonary/Chest: Breath sounds normal. No respiratory distress.  Abdominal: Soft. Bowel sounds are normal. There is no tenderness. There is no guarding.  Musculoskeletal: Normal range of motion.  Lymphadenopathy:        Head (right side): No submandibular adenopathy present.       Head (left side): No submandibular adenopathy present.    She has no cervical adenopathy.  Neurological: She is alert and oriented to person, place, and time. She has normal strength. No cranial nerve deficit or sensory deficit.  Skin: Skin is warm and dry.  Psychiatric: She has a normal mood and affect. Her speech is normal.  Nursing note and vitals reviewed.   ED Course  Procedures (including critical care time) Labs Review Labs Reviewed - No data to display  Imaging Review No results found. I have personally reviewed and evaluated these images and lab results as part of my medical decision-making.   EKG Interpretation None      MDM  Patient has a tachycardia present,  otherwise the vital signs are within normal limits. The examination favors a viral illness. The patient has been advised to get lots of rest. She has been further advised to increase fluids, and to use Tylenol every 4 hours, or ibuprofen every 6 hours for fever or aching. Patient has been given a note to excuse her from work. Questions have been answered. Feel that it is safe for the patient to be discharged home, she will return if any changes, problems, or concerns.    Final diagnoses:  Viral respiratory illness    *I have reviewed nursing notes, vital signs, and all appropriate lab and imaging results for this patient.[address], PA-C 10/14/15 1105  Nat Christen, MD 10/15/15 [phone]

## 2015-10-14 NOTE — ED Notes (Signed)
Pt left facility per registration. 

## 2015-10-14 NOTE — ED Notes (Signed)
Called pt no answer °

## 2015-10-20 ENCOUNTER — Encounter: Payer: Self-pay | Admitting: Family Medicine

## 2015-10-20 ENCOUNTER — Ambulatory Visit (INDEPENDENT_AMBULATORY_CARE_PROVIDER_SITE_OTHER): Payer: BLUE CROSS/BLUE SHIELD | Admitting: Family Medicine

## 2015-10-20 VITALS — BP 132/88 | HR 96 | Temp 98.9°F | Resp 18 | Ht 64.0 in | Wt 225.0 lb

## 2015-10-20 DIAGNOSIS — M545 Low back pain, unspecified: Secondary | ICD-10-CM

## 2015-10-20 DIAGNOSIS — G4700 Insomnia, unspecified: Secondary | ICD-10-CM | POA: Diagnosis not present

## 2015-10-20 DIAGNOSIS — J209 Acute bronchitis, unspecified: Secondary | ICD-10-CM | POA: Diagnosis not present

## 2015-10-20 DIAGNOSIS — J0100 Acute maxillary sinusitis, unspecified: Secondary | ICD-10-CM | POA: Diagnosis not present

## 2015-10-20 MED ORDER — ALBUTEROL SULFATE HFA 108 (90 BASE) MCG/ACT IN AERS: INHALATION_SPRAY | RESPIRATORY_TRACT | Status: DC

## 2015-10-20 MED ORDER — HYDROCODONE-ACETAMINOPHEN 7.5-325 MG PO TABS: 1.0000 | ORAL_TABLET | Freq: Four times a day (QID) | ORAL | Status: DC | PRN

## 2015-10-20 MED ORDER — AZITHROMYCIN 250 MG PO TABS: ORAL_TABLET | ORAL | Status: DC

## 2015-10-20 MED ORDER — HYDROCHLOROTHIAZIDE 25 MG PO TABS: 25.0000 mg | ORAL_TABLET | Freq: Every day | ORAL | Status: DC

## 2015-10-20 MED ORDER — ALBUTEROL SULFATE (2.5 MG/3ML) 0.083% IN NEBU
2.5000 mg | INHALATION_SOLUTION | Freq: Once | RESPIRATORY_TRACT | Status: AC
  Administered 2015-10-20: 2.5 mg via RESPIRATORY_TRACT

## 2015-10-20 MED ORDER — HYDROCODONE-HOMATROPINE 5-1.5 MG/5ML PO SYRP: 5.0000 mL | ORAL_SOLUTION | Freq: Three times a day (TID) | ORAL | Status: DC | PRN

## 2015-10-20 MED ORDER — PREDNISONE 20 MG PO TABS: 20.0000 mg | ORAL_TABLET | Freq: Every day | ORAL | Status: DC

## 2015-10-20 MED ORDER — AMLODIPINE BESYLATE 10 MG PO TABS: 10.0000 mg | ORAL_TABLET | Freq: Every day | ORAL | Status: DC

## 2015-10-20 MED ORDER — ZOLPIDEM TARTRATE 10 MG PO TABS: ORAL_TABLET | ORAL | Status: DC

## 2015-10-20 NOTE — Assessment & Plan Note (Signed)
Pain medication refill 

## 2015-10-20 NOTE — Progress Notes (Signed)
Patient ID: Robin Sherman, female   DOB: Jun 13, 1961, 55 y.o.   MRN: [id number]    Subjective:    Patient ID: Robin Sherman, female    DOB: 11/04/1960, 55 y.o.   MRN: [id number]  Patient presents for Illness Is here for medication review. She needs a refill on her pain medication and her Ambien for sleep. She also continues to have worsening cough with production she also has pressure in her ears nasal drainage. She started with symptoms about a week ago she went to the emergency room on the 30th because of severe ear pain and chest congestion she was diagnosed with viral illness advised to take over-the-counter medications. She's been doing this as prescribed and also drinking lots of fluids but her symptoms seem to be worsening. She's not had any recent fever but has had some chills. She has been out of work since the 27th when her symptoms started. She's noticed more wheezing as well she is out of her albuterol inhaler   Review Of Systems:  GEN- denies fatigue, fever, weight loss,weakness, recent illness HEENT- denies eye drainage, change in vision, +nasal discharge, CVS- denies chest pain, palpitations RESP- denies SOB,+ cough, +wheeze ABD- denies N/V, change in stools, abd pain GU- denies dysuria, hematuria, dribbling, incontinence MSK- denies joint pain, muscle aches, injury Neuro- denies headache, dizziness, syncope, seizure activity       Objective:    BP 132/88 mmHg  Pulse 96  Temp(Src) 98.9 F (37.2 C) (Oral)  Resp 18  Ht 5\' 4"  (1.626 m)  Wt 225 lb (102.059 kg)  BMI 38.60 kg/m2  SpO2 97%  LMP 04/15/2013 GEN- NAD, alert and oriented x3 HEENT- PERRL, EOMI, non injected sclera, pink conjunctiva, MMM, oropharynx clear  TM clear bilat no effusion,  + mild maxillary sinus tenderness, inflammed turbinates,  Nasal drainage  Neck- Supple, no LAD CVS- RRR, no murmur Bilateral wheeze bronchospasm normal work of breathing no rales, +rhonchi  EXT- No edema Pulses- Radial  2+   S/P neb Improved work of breathing improved bronchospasm        Assessment & Plan:      Problem List Items Addressed This Visit    None    Visit Diagnoses    Acute bronchitis, unspecified organism    -  Primary    Bronchitis, underlying asthma, restart albuterol, prednisone burst, zpak, worsening symptoms, cough med, not to take with pain meds    Acute maxillary sinusitis, recurrence not specified        Relevant Medications    azithromycin (ZITHROMAX) 250 MG tablet    HYDROcodone-homatropine (HYCODAN) 5-1.5 MG/5ML syrup    predniSONE (DELTASONE) 20 MG tablet       Note: This dictation was prepared with Dragon dictation along with smaller phrase technology. Any transcriptional errors that result from this process are unintentional.

## 2015-10-20 NOTE — Assessment & Plan Note (Signed)
Ambien refilled

## 2015-10-20 NOTE — Addendum Note (Signed)
Addended by: Sheral Flow on: 10/20/2015 04:54 PM   Modules accepted: Orders

## 2015-10-20 NOTE — Patient Instructions (Addendum)
Use albuterol every 4 hours as needed Take prednisone as prescribed  Take cough medicine as needed- do not take with  Hycodan for cough Give Work note- 4/3-10/20/15, can return 10/21/15 F/U 3 months for Physical

## 2016-01-07 ENCOUNTER — Encounter: Payer: Self-pay | Admitting: Family Medicine

## 2016-01-10 ENCOUNTER — Encounter: Payer: Self-pay | Admitting: Family Medicine

## 2016-01-10 ENCOUNTER — Ambulatory Visit (INDEPENDENT_AMBULATORY_CARE_PROVIDER_SITE_OTHER): Payer: BLUE CROSS/BLUE SHIELD | Admitting: Family Medicine

## 2016-01-10 ENCOUNTER — Encounter: Payer: Self-pay | Admitting: Orthopedic Surgery

## 2016-01-10 ENCOUNTER — Ambulatory Visit (INDEPENDENT_AMBULATORY_CARE_PROVIDER_SITE_OTHER): Payer: BLUE CROSS/BLUE SHIELD | Admitting: Orthopedic Surgery

## 2016-01-10 VITALS — BP 135/92 | Ht 65.0 in | Wt 228.0 lb

## 2016-01-10 VITALS — BP 132/78 | HR 82 | Temp 98.2°F | Resp 14 | Ht 64.0 in | Wt 229.0 lb

## 2016-01-10 DIAGNOSIS — R7303 Prediabetes: Secondary | ICD-10-CM

## 2016-01-10 DIAGNOSIS — Z0000 Encounter for general adult medical examination without abnormal findings: Secondary | ICD-10-CM | POA: Diagnosis not present

## 2016-01-10 DIAGNOSIS — M722 Plantar fascial fibromatosis: Secondary | ICD-10-CM | POA: Diagnosis not present

## 2016-01-10 DIAGNOSIS — E559 Vitamin D deficiency, unspecified: Secondary | ICD-10-CM | POA: Diagnosis not present

## 2016-01-10 DIAGNOSIS — Z1159 Encounter for screening for other viral diseases: Secondary | ICD-10-CM | POA: Diagnosis not present

## 2016-01-10 DIAGNOSIS — M6789 Other specified disorders of synovium and tendon, multiple sites: Secondary | ICD-10-CM | POA: Diagnosis not present

## 2016-01-10 DIAGNOSIS — I10 Essential (primary) hypertension: Secondary | ICD-10-CM

## 2016-01-10 DIAGNOSIS — M76829 Posterior tibial tendinitis, unspecified leg: Secondary | ICD-10-CM

## 2016-01-10 DIAGNOSIS — M2550 Pain in unspecified joint: Secondary | ICD-10-CM | POA: Diagnosis not present

## 2016-01-10 LAB — LIPID PANEL
Cholesterol: 198 mg/dL (ref 125–200)
HDL: 46 mg/dL (ref >=46)
LDL CALC: 125 mg/dL (ref <130)
Total CHOL/HDL Ratio: 4.3 Ratio (ref <=5.0)
Triglycerides: 136 mg/dL (ref <150)
VLDL: 27 mg/dL (ref <30)

## 2016-01-10 LAB — COMPREHENSIVE METABOLIC PANEL
ALBUMIN: 4.3 g/dL (ref 3.6–5.1)
ALK PHOS: 110 U/L (ref 33–130)
ALT: 28 U/L (ref 6–29)
AST: 19 U/L (ref 10–35)
BILIRUBIN TOTAL: 0.3 mg/dL (ref 0.2–1.2)
BUN: 13 mg/dL (ref 7–25)
CO2: 25 mmol/L (ref 20–31)
CREATININE: 0.66 mg/dL (ref 0.50–1.05)
Calcium: 9.5 mg/dL (ref 8.6–10.4)
Chloride: 105 mmol/L (ref 98–110)
GLUCOSE: 178 mg/dL — AB (ref 70–99)
Potassium: 4.3 mmol/L (ref 3.5–5.3)
SODIUM: 140 mmol/L (ref 135–146)
Total Protein: 7.5 g/dL (ref 6.1–8.1)

## 2016-01-10 LAB — HEMOGLOBIN A1C
HEMOGLOBIN A1C: 6.4 % — AB (ref <5.7)
Mean Plasma Glucose: 137 mg/dL

## 2016-01-10 MED ORDER — HYDROCODONE-ACETAMINOPHEN 7.5-325 MG PO TABS: 1.0000 | ORAL_TABLET | Freq: Four times a day (QID) | ORAL | Status: DC | PRN

## 2016-01-10 MED ORDER — ZOLPIDEM TARTRATE 10 MG PO TABS: ORAL_TABLET | ORAL | Status: DC

## 2016-01-10 NOTE — Patient Instructions (Addendum)
I recommend eye visit once a year I recommend dental visit every 6 months Goal is to  Exercise 30 minutes 5 days a week We will send a letter with lab results  F/U 4 months

## 2016-01-10 NOTE — Progress Notes (Signed)
Chief complaint pain right heel and foot    HPI 55 years old three-month history to medical onset of pain on the plantar aspect of the right foot and medial and lateral aspects of the foot and ankle with throbbing burning pain stiffness numbness and tingling. She did not improve with ibuprofen BC powder or Goody powder worse with standing  ROS  she has reported to [place] night sweats and fatigue numbness tingling burning pain in the legs ankle leg swelling denies skin rashes GI discomfort urinary symptoms negative.   Past Medical History  Diagnosis Date  . Hypertension   . Asthma   . Eczema     Followed by Dr. Nevada Crane dermatology    Past Surgical History  Procedure Laterality Date  . Tubal ligation      [facility]  . Colonoscopy N/A 01/27/2013    Procedure: COLONOSCOPY;  Surgeon: Danie Binder, MD;  Location: [facility];  Service: Endoscopy;  Laterality: N/A;  8:30 AM  . Supracervical abdominal hysterectomy N/A 08/01/2013    Procedure: HYSTERECTOMY SUPRACERVICAL ABDOMINAL;  Surgeon: Jonnie Kind, MD;  Location: [facility];  Service: Gynecology;  Laterality: N/A;  . Bilateral salpingectomy Bilateral 08/01/2013    Procedure: BILATERAL SALPINGECTOMY;  Surgeon: Jonnie Kind, MD;  Location: [facility];  Service: Gynecology;  Laterality: Bilateral;  . Hematoma evacuation N/A 08/03/2013    Procedure: EVACUATION PELVIC HEMATOMA;  Surgeon: Jonnie Kind, MD;  Location: [facility];  Service: Gynecology;  Laterality: N/A;  . Abdominal hysterectomy    . Knee arthroscopy with medial menisectomy Left 01/14/2014    Procedure: KNEE ARTHROSCOPY WITH PARTIAL MEDIAL MENISECTOMY;  Surgeon: Carole Civil, MD;  Location: [facility];  Service: Orthopedics;  Laterality: Left;  . Chondroplasty Left 01/14/2014    Procedure: CHONDROPLASTY OF FEMUR;  Surgeon: Carole Civil, MD;  Location: [facility];  Service: Orthopedics;  Laterality: Left;   Family History  Problem Relation Age of Onset  . Heart disease Mother      MI  . Stroke Father   . Heart disease Father     MI  . Cancer Sister     breast  . Colon cancer Neg Hx    Social History  Substance Use Topics  . Smoking status: Former Smoker -- 0.50 packs/day for 20 years    Types: Cigarettes  . Smokeless tobacco: Never Used  . Alcohol Use: 0.0 oz/week    0 Standard drinks or equivalent per week     Comment: Rare    Current outpatient prescriptions:  .  albuterol (PROAIR HFA) 108 (90 Base) MCG/ACT inhaler, INHALE 2 PUFFS INTO THE LUNGS EVERY 4 HOURS AS NEEDED FOR WHEEZING ORSHORTNESS OF BREATH., Disp: 8.5 g, Rfl: 11 .  amLODipine (NORVASC) 10 MG tablet, Take 1 tablet (10 mg total) by mouth daily., Disp: 30 tablet, Rfl: 6 .  halobetasol (ULTRAVATE) 0.05 % cream, APPLY TO AFFECTED AREA TWICE DAILY., Disp: 50 g, Rfl: 3 .  hydrochlorothiazide (HYDRODIURIL) 25 MG tablet, Take 1 tablet (25 mg total) by mouth daily., Disp: 30 tablet, Rfl: 6 .  HYDROcodone-acetaminophen (NORCO) 7.5-325 MG tablet, Take 1 tablet by mouth every 6 (six) hours as needed for moderate pain., Disp: 80 tablet, Rfl: 0 .  ibuprofen (ADVIL,MOTRIN) 200 MG tablet, Take 400 mg by mouth every 6 (six) hours as needed for mild pain. Reported on 10/20/2015, Disp: , Rfl:  .  zolpidem (AMBIEN) 10 MG tablet, TAKE ONE TABLET BY MOUTH AT BEDTIME AS NEEDED  FOR SLEEP., Disp: 30 tablet, Rfl: 2 .  [DISCONTINUED] lisinopril-hydrochlorothiazide (PRINZIDE,ZESTORETIC) 20-12.5 MG per tablet, Take 1 tablet by mouth daily.  , Disp: , Rfl:   BP 135/92 mmHg  Ht 5\' 5"  (1.651 m)  Wt 228 lb (103.42 kg)  BMI 37.94 kg/m2  LMP 04/15/2013  Physical Exam  Constitutional: She is oriented to person, place, and time. She appears well-developed and well-nourished. No distress.  Cardiovascular: Normal rate and intact distal pulses.   Musculoskeletal:  Antalgic gait right leg  Neurological: She is alert and oriented to person, place, and time. She has normal reflexes. She exhibits normal muscle tone. Coordination  normal.  Skin: Skin is warm and dry. No rash noted. She is not diaphoretic. No erythema. No pallor.  Psychiatric: She has a normal mood and affect. Her behavior is normal. Judgment and thought content normal.    Ortho Exam Left foot and right foot are both flat on evaluation no tenderness in the left foot  Right foot tenderness plantar aspect medial tibia lateral subtalar joint. Ankle stable for this and pes planus position strength normal except for pain related weakness in the posterior tibial tendon skin is intact pulses are good temperature is normal sensation is normal.   ASSESSMENT: My personal interpretation of the images:  Imaging from the hospital shows a plantar heel spur and a flat foot    PLAN Encounter Diagnoses  Name Primary?  Marland Kitchen PTTD (posterior tibial tendon dysfunction) Yes  . Plantar fasciitis     Plan is for short Cam Walker 7 weeks follow-up 7 weeks  Home exercise stretching program Primary care gave her Norco for pain okay for the first week or 2 and then switched anti-inflammatory  Arther Abbott, MD 01/10/2016 10:53 AM

## 2016-01-10 NOTE — Progress Notes (Signed)
Patient ID: Robin Sherman, female   DOB: May 25, 1961, 55 y.o.   MRN: [id number]    Subjective:    Patient ID: Robin Sherman, female    DOB: 08-02-1960, 55 y.o.   MRN: [id number]  Patient presents for CPE  Pt here for CPE . Hypertension borderline diabetes and obesity. She is due for repeat fasting labs Last A1c 6.2% Mammogram up-to-date, Pap smear up-to-date colonoscopy up-to-date Discuss hepatitis C screening She is worried her weight was from excessive fluid and not just fat States she cant exercise because her knee pain and right bone spurs in foot  Request refill on pain meds, has appointment to see her orthopedist today.  Review Of Systems:  GEN- denies fatigue, fever, weight loss,weakness, recent illness HEENT- denies eye drainage, change in vision, nasal discharge, CVS- denies chest pain, palpitations RESP- denies SOB, cough, wheeze ABD- denies N/V, change in stools, abd pain GU- denies dysuria, hematuria, dribbling, incontinence MSK- +joint pain, muscle aches, injury Neuro- denies headache, dizziness, syncope, seizure activity       Objective:    BP 132/78 mmHg  Pulse 82  Temp(Src) 98.2 F (36.8 C) (Oral)  Resp 14  Ht 5\' 4"  (1.626 m)  Wt 229 lb (103.874 kg)  BMI 39.29 kg/m2  LMP 04/15/2013 GEN- NAD, alert and oriented x3 HEENT- PERRL, EOMI, non injected sclera, pink conjunctiva, MMM, oropharynx clear Neck- Supple, no thyromegaly CVS- RRR, no murmur RESP-CTAB ABD-NABS,soft,NT,ND EXT- No edema Pulses- Radial, DP- 2+        Assessment & Plan:      Problem List Items Addressed This Visit    Vitamin D deficiency   Relevant Orders   Vitamin D, 25-hydroxy   Pain, joint, multiple sites   Essential hypertension, benign - Primary    Well controlled      Relevant Orders   CBC with Differential/Platelet   Comprehensive metabolic panel   Borderline diabetes    Check A1C, given handouts on meal planning for diabetics, portion control Discussed that she does  not have any excessive fluid that this is her obesity. She then admits that she eats a lot of fried foods she states that is what her family likes. She is also not drinking water does not eat very much fruits and fresh vegetables      Relevant Orders   Hemoglobin A1c    Other Visit Diagnoses    Routine general medical examination at a health care facility        CPE done, fasting labs, hep c screen, mammo in Dec    Relevant Orders    CBC with Differential/Platelet    Comprehensive metabolic panel    Lipid panel    Need for hepatitis C screening test        Relevant Orders    Hepatitis C Antibody       Note: This dictation was prepared with Dragon dictation along with smaller phrase technology. Any transcriptional errors that result from this process are unintentional.

## 2016-01-10 NOTE — Assessment & Plan Note (Signed)
Check A1C, given handouts on meal planning for diabetics, portion control Discussed that she does not have any excessive fluid that this is her obesity. She then admits that she eats a lot of fried foods she states that is what her family likes. She is also not drinking water does not eat very much fruits and fresh vegetables

## 2016-01-10 NOTE — Patient Instructions (Addendum)
WEAR BOOT WHILE WALKING   DO EXERCISES LISTED BELOW   Plantar Fasciitis With Rehab The plantar fascia is a fibrous, ligament-like, soft-tissue structure that spans the bottom of the foot. Plantar fasciitis, also called heel spur syndrome, is a condition that causes pain in the foot due to inflammation of the tissue. SYMPTOMS   Pain and tenderness on the underneath side of the foot.  Pain that worsens with standing or walking. CAUSES  Plantar fasciitis is caused by irritation and injury to the plantar fascia on the underneath side of the foot. Common mechanisms of injury include:  Direct trauma to bottom of the foot.  Damage to a small nerve that runs under the foot where the main fascia attaches to the heel bone.  Stress placed on the plantar fascia due to bone spurs. RISK INCREASES WITH:   Activities that place stress on the plantar fascia (running, jumping, pivoting, or cutting).  Poor strength and flexibility.  Improperly fitted shoes.  Tight calf muscles.  Flat feet.  Failure to warm-up properly before activity.  Obesity. PREVENTION  Warm up and stretch properly before activity.  Allow for adequate recovery between workouts.  Maintain physical fitness:  Strength, flexibility, and endurance.  Cardiovascular fitness.  Maintain a health body weight.  Avoid stress on the plantar fascia.  Wear properly fitted shoes, including arch supports for individuals who have flat feet. PROGNOSIS  If treated properly, then the symptoms of plantar fasciitis usually resolve without surgery. However, occasionally surgery is necessary. RELATED COMPLICATIONS   Recurrent symptoms that may result in a chronic condition.  Problems of the lower back that are caused by compensating for the injury, such as limping.  Pain or weakness of the foot during push-off following surgery.  Chronic inflammation, scarring, and partial or complete fascia tear, occurring more often from  repeated injections. TREATMENT  Treatment initially involves the use of ice and medication to help reduce pain and inflammation. The use of strengthening and stretching exercises may help reduce pain with activity, especially stretches of the Achilles tendon. These exercises may be performed at home or with a therapist. Your caregiver may recommend that you use heel cups of arch supports to help reduce stress on the plantar fascia. Occasionally, corticosteroid injections are given to reduce inflammation. If symptoms persist for greater than 6 months despite non-surgical (conservative), then surgery may be recommended.  MEDICATION   If pain medication is necessary, then nonsteroidal anti-inflammatory medications, such as aspirin and ibuprofen, or other minor pain relievers, such as acetaminophen, are often recommended.  Do not take pain medication within 7 days before surgery.  Prescription pain relievers may be given if deemed necessary by your caregiver. Use only as directed and only as much as you need.  Corticosteroid injections may be given by your caregiver. These injections should be reserved for the most serious cases, because they may only be given a certain number of times. HEAT AND COLD  Cold treatment (icing) relieves pain and reduces inflammation. Cold treatment should be applied for 10 to 15 minutes every 2 to 3 hours for inflammation and pain and immediately after any activity that aggravates your symptoms. Use ice packs or massage the area with a piece of ice (ice massage).  Heat treatment may be used prior to performing the stretching and strengthening activities prescribed by your caregiver, physical therapist, or athletic trainer. Use a heat pack or soak the injury in warm water. SEEK IMMEDIATE MEDICAL CARE IF:  Treatment seems to offer no  benefit, or the condition worsens.  Any medications produce adverse side effects. EXERCISES RANGE OF MOTION (ROM) AND STRETCHING EXERCISES  - Plantar Fasciitis (Heel Spur Syndrome) These exercises may help you when beginning to rehabilitate your injury. Your symptoms may resolve with or without further involvement from your physician, physical therapist or athletic trainer. While completing these exercises, remember:   Restoring tissue flexibility helps normal motion to return to the joints. This allows healthier, less painful movement and activity.  An effective stretch should be held for at least 30 seconds.  A stretch should never be painful. You should only feel a gentle lengthening or release in the stretched tissue. RANGE OF MOTION - Toe Extension, Flexion  Sit with your right / left leg crossed over your opposite knee.  Grasp your toes and gently pull them back toward the top of your foot. You should feel a stretch on the bottom of your toes and/or foot.  Hold this stretch for _____2_____ seconds.  Now, gently pull your toes toward the bottom of your foot. You should feel a stretch on the top of your toes and or foot.  Hold this stretch for _____2_____ seconds. Repeat ______15____ times. Complete this stretch ______1____ times per day.  RANGE OF MOTION - Ankle Dorsiflexion, Active Assisted  Remove shoes and sit on a chair that is preferably not on a carpeted surface.  Place right / left foot under knee. Extend your opposite leg for support.  Keeping your heel down, slide your right / left foot back toward the chair until you feel a stretch at your ankle or calf. If you do not feel a stretch, slide your bottom forward to the edge of the chair, while still keeping your heel down.  Hold this stretch for ____3______ seconds. Repeat ____15______ times. Complete this stretch _____1_____ times per day.  STRETCH - Gastroc, Standing  Place hands on wall.  Extend right / left leg, keeping the front knee somewhat bent.  Slightly point your toes inward on your back foot.  Keeping your right / left heel on the floor and  your knee straight, shift your weight toward the wall, not allowing your back to arch.  You should feel a gentle stretch in the right / left calf. Hold this position for __3________ seconds. Repeat _____15_____ times. Complete this stretch _____1_____ times per day. STRETCH - Soleus, Standing  Place hands on wall.  Extend right / left leg, keeping the other knee somewhat bent.  Slightly point your toes inward on your back foot.  Keep your right / left heel on the floor, bend your back knee, and slightly shift your weight over the back leg so that you feel a gentle stretch deep in your back calf.  Hold this position for _____2_____ seconds. Repeat _____15____ times. Complete this stretch ____1______ times per day. STRETCH - Gastrocsoleus, Standing  Note: This exercise can place a lot of stress on your foot and ankle. Please complete this exercise only if specifically instructed by your caregiver.   Place the ball of your right / left foot on a step, keeping your other foot firmly on the same step.  Hold on to the wall or a rail for balance.  Slowly lift your other foot, allowing your body weight to press your heel down over the edge of the step.  You should feel a stretch in your right / left calf.  Hold this position for ____2______ seconds.  Repeat this exercise with a slight bend in your  right / left knee. Repeat ______15____ times. Complete this stretch _____1_____ times per day.

## 2016-01-10 NOTE — Assessment & Plan Note (Signed)
Well controlled 

## 2016-01-11 LAB — CBC WITH DIFFERENTIAL/PLATELET
BASOS ABS: 80 {cells}/uL (ref 0–200)
BASOS PCT: 1 %
EOS ABS: 640 {cells}/uL — AB (ref 15–500)
Eosinophils Relative: 8 %
HEMATOCRIT: 41.3 % (ref 35.0–45.0)
HEMOGLOBIN: 13.5 g/dL (ref 12.0–15.0)
Lymphocytes Relative: 31 %
Lymphs Abs: 2480 cells/uL (ref 850–3900)
MCH: 28.2 pg (ref 27.0–33.0)
MCHC: 32.7 g/dL (ref 32.0–36.0)
MCV: 86.4 fL (ref 80.0–100.0)
MONO ABS: 480 {cells}/uL (ref 200–950)
MONOS PCT: 6 %
MPV: 9.6 fL (ref 7.5–12.5)
NEUTROS ABS: 4320 {cells}/uL (ref 1500–7800)
Neutrophils Relative %: 54 %
PLATELETS: 420 10*3/uL — AB (ref 140–400)
RBC: 4.78 MIL/uL (ref 3.80–5.10)
RDW: 14.9 % (ref 11.0–15.0)
WBC: 8 10*3/uL (ref 3.8–10.8)

## 2016-01-11 LAB — HEPATITIS C ANTIBODY: HCV AB: NEGATIVE

## 2016-01-11 LAB — VITAMIN D 25 HYDROXY (VIT D DEFICIENCY, FRACTURES): VIT D 25 HYDROXY: 38 ng/mL (ref 30–100)

## 2016-01-19 ENCOUNTER — Encounter: Payer: Self-pay | Admitting: Orthopedic Surgery

## 2016-01-20 ENCOUNTER — Other Ambulatory Visit: Payer: Self-pay | Admitting: *Deleted

## 2016-01-20 ENCOUNTER — Encounter: Payer: Self-pay | Admitting: *Deleted

## 2016-01-20 MED ORDER — METFORMIN HCL ER 500 MG PO TB24: 500.0000 mg | ORAL_TABLET | Freq: Every day | ORAL | Status: DC

## 2016-02-28 ENCOUNTER — Encounter: Payer: Self-pay | Admitting: Orthopedic Surgery

## 2016-02-28 ENCOUNTER — Ambulatory Visit (INDEPENDENT_AMBULATORY_CARE_PROVIDER_SITE_OTHER): Payer: BLUE CROSS/BLUE SHIELD | Admitting: Orthopedic Surgery

## 2016-02-28 ENCOUNTER — Other Ambulatory Visit: Payer: Self-pay | Admitting: Family Medicine

## 2016-02-28 VITALS — BP 138/92 | Ht 64.0 in | Wt 228.0 lb

## 2016-02-28 DIAGNOSIS — M76829 Posterior tibial tendinitis, unspecified leg: Secondary | ICD-10-CM

## 2016-02-28 DIAGNOSIS — M6789 Other specified disorders of synovium and tendon, multiple sites: Secondary | ICD-10-CM | POA: Diagnosis not present

## 2016-02-28 MED ORDER — IBUPROFEN 200 MG PO TABS: 400.0000 mg | ORAL_TABLET | Freq: Four times a day (QID) | ORAL | 0 refills | Status: DC | PRN

## 2016-02-28 MED ORDER — HYDROCODONE-ACETAMINOPHEN 7.5-325 MG PO TABS: 1.0000 | ORAL_TABLET | Freq: Three times a day (TID) | ORAL | 0 refills | Status: DC | PRN

## 2016-02-28 NOTE — Telephone Encounter (Signed)
Refill appropriate and filled per protocol. 

## 2016-02-28 NOTE — Patient Instructions (Addendum)
Please write a note for work  to the effect of the patient will require wearing a Cam Walker for the next 6 weeks Continue wearing the [employer] physical therapy  Continue ibuprofen  Continue Norco/hydrocodone  Please be advised. You are on a medication which is classified as an "opiod". The CDC the [facility]  has recently advised all providers to advise patient's that these medications have certain risks which include but are not limited to:    drug intolerance  drug addiction  respiratory depression   respiratory failure  Death  Please keep these medications locked away. If you feel that you are becoming addicted to these medicines or you are having difficulties with these medications please alert your provider.   YOU WILL BE WEANED OFF OF THE MEDICATION over time

## 2016-02-28 NOTE — Progress Notes (Signed)
This is a follow-up visit  Chief Complaint  Patient presents with  . Follow-up    right foot pain    Posterior tibial tendon dysfunction right foot, plantar fasciitis  Patient currently wearing Cam Walker full-time with ibuprofen and Norco for pain  Still complains of plantar foot pain not sure if any better or worse  Review of systems is no numbness in the foot or tingling in the foot  BP (!) 138/92   Ht 5\' 4"  (1.626 m)   Wt 228 lb (103.4 kg)   LMP 04/15/2013   BMI 39.14 kg/m  She is oriented 3 mood is pleasant she is well-developed well-nourished grooming hygiene normal she walks with a limp even with the boot  She has tenderness on the plantar aspect of the foot some of the swelling over the posterior tibial tendon is still there her single leg heel raise was poor and show weakness of the posterior tibial tendon sensation the foot was normal  Encounter Diagnosis  Name Primary?  Marland Kitchen PTTD (posterior tibial tendon dysfunction) Yes     Recommend physical therapy for 4 weeks continue Norco for pain every 8 when necessary and ibuprofen.  Appropriate opioid warnings given  Wear the boot at work  Follow-up 6 weeks if no improvement we should strongly consider MRI of the foot

## 2016-03-08 ENCOUNTER — Encounter (HOSPITAL_COMMUNITY): Payer: Self-pay | Admitting: Physical Therapy

## 2016-03-08 ENCOUNTER — Ambulatory Visit (HOSPITAL_COMMUNITY): Payer: BLUE CROSS/BLUE SHIELD | Attending: Orthopedic Surgery | Admitting: Physical Therapy

## 2016-03-08 DIAGNOSIS — M25671 Stiffness of right ankle, not elsewhere classified: Secondary | ICD-10-CM | POA: Diagnosis present

## 2016-03-08 DIAGNOSIS — M6281 Muscle weakness (generalized): Secondary | ICD-10-CM

## 2016-03-08 DIAGNOSIS — R2681 Unsteadiness on feet: Secondary | ICD-10-CM | POA: Diagnosis present

## 2016-03-08 DIAGNOSIS — R2689 Other abnormalities of gait and mobility: Secondary | ICD-10-CM | POA: Diagnosis present

## 2016-03-08 DIAGNOSIS — M25571 Pain in right ankle and joints of right foot: Secondary | ICD-10-CM

## 2016-03-08 NOTE — Therapy (Signed)
[place], [place], [address] Phone: [phone]   Fax:  [phone]  Physical Therapy Evaluation  Patient Details  Name: Robin Sherman MRN: [id number] Date of Birth: 07/24/1960 Referring Provider: Arther Abbott, MD  Encounter Date: 03/08/2016      PT End of Session - 03/08/16 1050    Visit Number 1   Number of Visits 12   Date for PT Re-Evaluation 03/29/16   Authorization Type BC[employer]   Authorization Time Period 03/08/16 to 04/21/16   PT Start Time 0956  pt arrived late    PT Stop Time 1030   PT Time Calculation (min) 34 min   Equipment Utilized During Treatment Other (comment)  Rt ankle boot    Activity Tolerance Patient tolerated treatment well   Behavior During Therapy [facility] for tasks assessed/performed      Past Medical History:  Diagnosis Date  . Asthma   . Eczema    Followed by Dr. [place] Crane dermatology  . Hypertension     Past Surgical History:  Procedure Laterality Date  . ABDOMINAL HYSTERECTOMY    . BILATERAL SALPINGECTOMY Bilateral 08/01/2013   Procedure: BILATERAL SALPINGECTOMY;  Surgeon: Jonnie Kind, MD;  Location: [facility];  Service: Gynecology;  Laterality: Bilateral;  . CHONDROPLASTY Left 01/14/2014   Procedure: CHONDROPLASTY OF FEMUR;  Surgeon: Carole Civil, MD;  Location: [facility];  Service: Orthopedics;  Laterality: Left;  . COLONOSCOPY N/A 01/27/2013   Procedure: COLONOSCOPY;  Surgeon: Danie Binder, MD;  [facility];  Service: Endoscopy;  Laterality: N/A;  8:30 AM  . HEMATOMA EVACUATION N/A 08/03/2013   Procedure: EVACUATION PELVIC HEMATOMA;  Surgeon: Jonnie Kind, MD;  Location: [facility];  Service: Gynecology;  Laterality: N/A;  . KNEE ARTHROSCOPY WITH MEDIAL MENISECTOMY Left 01/14/2014   Procedure: KNEE ARTHROSCOPY WITH PARTIAL MEDIAL MENISECTOMY;  Surgeon: Carole Civil, MD;  Location: [facility];  Service: Orthopedics;  Laterality: Left;  . SUPRACERVICAL ABDOMINAL  HYSTERECTOMY N/A 08/01/2013   Procedure: HYSTERECTOMY SUPRACERVICAL ABDOMINAL;  Surgeon: Jonnie Kind, MD;  Location: [facility];  Service: Gynecology;  Laterality: N/A;  . TUBAL LIGATION     [place]    There were no vitals filed for this visit.       Subjective Assessment - 03/08/16 1001    Subjective Pt reports pain started out of no where several months ago. She went to the ER because she could hardly walk. xray revealed a bone spur and she waited without any improvement. She saw Aline Brochure who placed her in a boot and instructed her to wear the boot for 6 weeks. 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 She feels that her pain is the same since starting to wear the boot.    Pertinent History Asthma, HTN   Limitations Walking   How long can you sit comfortably? unlimited   How long can you stand comfortably? unsure   How long can you walk comfortably? 30 minutes   Diagnostic tests Xray: negative for fracture, (+) bone spur    Patient Stated Goals improve pain and mobility   Currently in Pain? Other (Comment)  Worst: 10/10; Best: 0/10   Pain Score 1    Pain Orientation Right   Pain Descriptors / Indicators Sharp   Pain Type Acute pain   Pain Radiating Towards sometimes feels like it travels up her leg and to the hip    Pain Onset More than a month  ago   Pain Frequency Intermittent   Aggravating Factors  standing, walking, early morning   Pain Relieving Factors resting, ice, pain meds   Effect of Pain on Daily Activities limited walking/standing tolerance   Multiple Pain Sites No            [facility] PT Assessment - 03/08/16 0001      Assessment   Medical Diagnosis Rt ankle pain   Referring Provider Arther Abbott, MD   Next MD Visit 04/03/16  approximate    Prior Therapy none      Precautions   Precautions None   Required Braces or Orthoses Other Brace/Splint   Other Brace/Splint instructed to wear boot for 4 more weeks while on her feet (6 total)     Restrictions    Weight Bearing Restrictions No     Balance Screen   Has the patient fallen in the past 6 months No   Has the patient had a decrease in activity level because of a fear of falling?  No   Is the patient reluctant to leave their home because of a fear of falling?  No     Home Ecologist residence     Prior Function   Level of Independence Independent     Cognition   Overall Cognitive Status Within Functional Limits for tasks assessed     Observation/Other Assessments   Observations Pt wearing boot on Rt ankle      Sensation   Light Touch Appears Intact     Posture/Postural Control   Posture Comments standing: hip adduction, B foot pronation (Rt>Lt)     ROM / Strength   AROM / PROM / Strength AROM;Strength     AROM   AROM Assessment Site Ankle   Right/Left Ankle Right;Left   Right Ankle Dorsiflexion 6   Right Ankle Inversion 20   Right Ankle Eversion 8   Left Ankle Dorsiflexion 4   Left Ankle Inversion 20   Left Ankle Eversion 8     Strength   Strength Assessment Site Hip;Ankle   Right/Left Hip Right;Left   Right Hip Extension 3+/5   Right Hip ABduction 4/5   Left Hip Extension 3+/5   Left Hip ABduction 4+/5   Right/Left Ankle Right;Left   Right Ankle Dorsiflexion 4+/5   Right Ankle Plantar Flexion 3/5  10   Right Ankle Inversion 4+/5   Left Ankle Dorsiflexion 4+/5   Left Ankle Plantar Flexion 3/5  6   Left Ankle Inversion 5/5     Palpation   Palpation comment TTP along Rt distal posterior tibial tendon and plantar aspect of calcaneus along plantar fascia     Special Tests    Special Tests Ankle/Foot Special Tests   Ankle/Foot Special Tests  Provocative Tinel's Test     Provocative Tinel's test    Findings Negative   Comments BLE     Ambulation/Gait   Gait Comments (+) foot pronation Rt>Lt, decreased push off noted BLE     High Level Balance   High Level Balance Comments SLS: Rt: 10 sec, Lt: 6 sec                             PT Education - 03/08/16 1046    Education provided Yes   Education Details nature and cuases of posterior tib tendon irritation; discussed wear of Rt ankle immobilizer; use of brace at night to improve stiffness and  pain along plantar fascia in the morning; deferred HEP to next visit to allow for    Person(s) Educated Patient   Methods Explanation   Comprehension Verbalized understanding          PT Short Term Goals - 03/08/16 1532      PT SHORT TERM GOAL #1   Title Pt will demo consistency and independence with her HEP to improve strength and mobility.   Time 2   Period Weeks   Status New     PT SHORT TERM GOAL #2   Title Pt will report no greater than a 6/10 max pain during activity.   Time 3   Period Weeks   Status New     PT SHORT TERM GOAL #3   Title Pt will demo improved ankle DF to atleast 10 degrees to improve mechanics during gait.    Time 3   Period Weeks   Status New           PT Long Term Goals - 03/08/16 1534      PT LONG TERM GOAL #1   Title Pt will demo improved BLE strength of 5/5 MMT (except ankle PF) to increase safety with mobility at work.    Time 6   Period Weeks   Status New     PT LONG TERM GOAL #2   Title Pt will report improved activity tolerance evident by her ability to ambulate for up to 2 hours at work without more than 3/10 pain.    Time 6   Period Weeks   Status New     PT LONG TERM GOAL #3   Title Pt will demo improved balance evident by her ability to maintain SLS on each LE for up to 15 sec, 3/5 trials.    Time 6   Period Weeks   Status New     PT LONG TERM GOAL #4   Title Pt will demo improved gastroc strength up to atleast 4/5 MMT, in order to assist during gait.   Time 6   Period Weeks   Status New               Plan - 03/08/16 1051    Clinical Impression Statement Pt is a 55yo F referred to [facility] for evaluation and treatment of Rt foot pain onset insidiously  several months ago. She presents with impaired balance, pain and limited activity tolerance secondary to poor LE alignment with B foot pronation, limited ankle mobility and impaired LE strength. She is tender to palpation along inferior surface of the calcaneus and the distal post tibialis tendon. She has been wearing a boot for 2 weeks and was encouraged to wear for 4 more weeks, however this appears to not be improving her pain and could prolong her recovery considering the likely nature of her pain. I reviewed this and other evaluation findings with the pt who verbalized agreement. HEP was not provided this visit secondary to limited time and will be addressed at her next session. Pt would benefit from skilled PT to address her limitations, improve pain and facilitate full return to work without complications.     Rehab Potential Good   Clinical Impairments Affecting Rehab Potential (-) working 12 hour days on her feet    PT Frequency 2x / week   PT Duration 6 weeks   PT Next Visit Plan HEP: towel scrunch; ankle 4 way, ice massage    PT Home Exercise Plan provide at next visit  Recommended Other Services none    Consulted and Agree with Plan of Care Patient      Patient will benefit from skilled therapeutic intervention in order to improve the following deficits and impairments:  Abnormal gait, Decreased activity tolerance, Decreased balance, Decreased strength, Difficulty walking, Pain, Impaired flexibility, Increased edema, Decreased range of motion, Decreased mobility, Postural dysfunction  Visit Diagnosis: Pain in right ankle and joints of right foot  Stiffness of right ankle, not elsewhere classified  Muscle weakness (generalized)  Other abnormalities of gait and mobility  Unsteadiness on feet     Problem List Patient Active Problem List   Diagnosis Date Noted  . Borderline diabetes 07/02/2015  . Vitamin D deficiency 07/02/2015  . Lumbar back pain 07/02/2015  . Acute  meniscal tear of left knee 12/23/2013  . Bursitis of left knee 11/03/2013  . Insomnia 10/29/2013  . Sciatica of left side 10/13/2013  . Arthritis of knee, degenerative 10/13/2013  . Left knee pain 10/13/2013  . Pain, joint, multiple sites 04/15/2013  . Arthritis 12/02/2012  . Headache(784.0) 09/25/2012  . Hand dermatitis 03/02/2012  . Essential hypertension, benign 09/21/2011  . Obesity 09/21/2011  . Shoulder pain, right 09/21/2011  . Asthma, intermittent 09/21/2011  . Tobacco user 09/21/2011  . TRIGGER FINGER 11/03/2008    3:51 PM,03/08/16 Elly Modena PT, DPT [facility] [place] [address] [place], [place], [address] Phone: [phone]   Fax:  [phone]  Name: Noorah Luger MRN: [id number] Date of Birth: February 13, 1961

## 2016-03-09 ENCOUNTER — Ambulatory Visit (HOSPITAL_COMMUNITY): Payer: BLUE CROSS/BLUE SHIELD | Admitting: Physical Therapy

## 2016-03-09 DIAGNOSIS — R2689 Other abnormalities of gait and mobility: Secondary | ICD-10-CM

## 2016-03-09 DIAGNOSIS — M25571 Pain in right ankle and joints of right foot: Secondary | ICD-10-CM

## 2016-03-09 DIAGNOSIS — M6281 Muscle weakness (generalized): Secondary | ICD-10-CM

## 2016-03-09 DIAGNOSIS — M25671 Stiffness of right ankle, not elsewhere classified: Secondary | ICD-10-CM

## 2016-03-09 DIAGNOSIS — R2681 Unsteadiness on feet: Secondary | ICD-10-CM

## 2016-03-09 NOTE — Therapy (Signed)
[place], [place], [address] Phone: [phone]   Fax:  [phone]  Physical Therapy Treatment  Patient Details  Name: Robin Sherman MRN: [id number] Date of Birth: 1960/08/26 Referring Provider: Arther Abbott, MD  Encounter Date: 03/09/2016      PT End of Session - 03/09/16 0828    Visit Number 2   Number of Visits 12   Date for PT Re-Evaluation 03/29/16   Authorization Type BCBS   Authorization Time Period 03/08/16 to 04/21/16   PT Start Time 0816   PT Stop Time 0856   PT Time Calculation (min) 40 min   Activity Tolerance Patient tolerated treatment well   Behavior During Therapy [facility] for tasks assessed/performed      Past Medical History:  Diagnosis Date  . Asthma   . Eczema    Followed by Dr. [place] Crane dermatology  . Hypertension     Past Surgical History:  Procedure Laterality Date  . ABDOMINAL HYSTERECTOMY    . BILATERAL SALPINGECTOMY Bilateral 08/01/2013   Procedure: BILATERAL SALPINGECTOMY;  Surgeon: Jonnie Kind, MD;  Location: [facility];  Service: Gynecology;  Laterality: Bilateral;  . CHONDROPLASTY Left 01/14/2014   Procedure: CHONDROPLASTY OF FEMUR;  Surgeon: Carole Civil, MD;  Location: [facility];  Service: Orthopedics;  Laterality: Left;  . COLONOSCOPY N/A 01/27/2013   Procedure: COLONOSCOPY;  Surgeon: Danie Binder, MD;  [facility];  Service: Endoscopy;  Laterality: N/A;  8:30 AM  . HEMATOMA EVACUATION N/A 08/03/2013   Procedure: EVACUATION PELVIC HEMATOMA;  Surgeon: Jonnie Kind, MD;  Location: [facility];  Service: Gynecology;  Laterality: N/A;  . KNEE ARTHROSCOPY WITH MEDIAL MENISECTOMY Left 01/14/2014   Procedure: KNEE ARTHROSCOPY WITH PARTIAL MEDIAL MENISECTOMY;  Surgeon: Carole Civil, MD;  Location: [facility];  Service: Orthopedics;  Laterality: Left;  . SUPRACERVICAL ABDOMINAL HYSTERECTOMY N/A 08/01/2013   Procedure: HYSTERECTOMY SUPRACERVICAL ABDOMINAL;  Surgeon: Jonnie Kind, MD;  Location: [facility];  Service: Gynecology;  Laterality: N/A;  . TUBAL LIGATION     [place]    There were no vitals filed for this visit.      Subjective Assessment - 03/09/16 0819    Subjective Pt arrives stating she is feeling a little better since yesterday. She took the boot off yesterday and has had no issues. She only has minor pain along her medial ankle currently.    Pertinent History Asthma, HTN   Limitations Walking   How long can you sit comfortably? unlimited   How long can you stand comfortably? unsure   How long can you walk comfortably? 30 minutes   Diagnostic tests Xray: negative for fracture, (+) bone spur    Patient Stated Goals improve pain and mobility   Currently in Pain? Yes   Pain Score 1    Pain Location Foot   Pain Orientation Right;Medial                         [facility] Adult PT Treatment/Exercise - 03/09/16 0001      Exercises   Exercises Ankle     Manual Therapy   Manual Therapy Myofascial release   Manual therapy comments separate from all other interventions    Myofascial Release Rt gastroc/soleus/plantar fascia     Ankle Exercises: Stretches   Gastroc Stretch 3 reps;30 seconds   Gastroc Stretch Limitations against wall, cues for correct technique     Ankle  Exercises: Seated   Towel Crunch 2 reps     Ankle Exercises: Supine   T-Band ankle 4-way with green TB, x20 reps; PF x20 BLE                PT Education - 03/09/16 0843    Education provided Yes   Education Details initial HEP; technique with therex   Person(s) Educated Patient   Methods Explanation;Demonstration;Verbal cues;Handout   Comprehension Verbalized understanding;Returned demonstration          PT Short Term Goals - 03/08/16 1532      PT SHORT TERM GOAL #1   Title Pt will demo consistency and independence with her HEP to improve strength and mobility.   Time 2   Period Weeks   Status New     PT SHORT TERM GOAL #2    Title Pt will report no greater than a 6/10 max pain during activity.   Time 3   Period Weeks   Status New     PT SHORT TERM GOAL #3   Title Pt will demo improved ankle DF to atleast 10 degrees to improve mechanics during gait.    Time 3   Period Weeks   Status New           PT Long Term Goals - 03/08/16 1534      PT LONG TERM GOAL #1   Title Pt will demo improved BLE strength of 5/5 MMT (except ankle PF) to increase safety with mobility at work.    Time 6   Period Weeks   Status New     PT LONG TERM GOAL #2   Title Pt will report improved activity tolerance evident by her ability to ambulate for up to 2 hours at work without more than 3/10 pain.    Time 6   Period Weeks   Status New     PT LONG TERM GOAL #3   Title Pt will demo improved balance evident by her ability to maintain SLS on each LE for up to 15 sec, 3/5 trials.    Time 6   Period Weeks   Status New     PT LONG TERM GOAL #4   Title Pt will demo improved gastroc strength up to atleast 4/5 MMT, in order to assist during gait.   Time 6   Period Weeks   Status New               Plan - 03/09/16 [id number]    Clinical Impression Statement PT arrived today without her boot and reporting some improvement since her last visit. Session focused on initation of her HEP with pt able to return correct demonstration of therex. Some verbal cues were provided initially to adjust technique, however she was able to perform independently by the end of the session. Goals and POC was reviewed with pt at the end of the session and she verbalized understanding.    Rehab Potential Good   Clinical Impairments Affecting Rehab Potential (-) working 12 hour days on her feet    PT Frequency 2x / week   PT Duration 6 weeks   PT Treatment/Interventions ADLs/Self Care Home Management;Cryotherapy;Therapeutic exercise;Therapeutic activities;Functional mobility training;Stair training;Gait training;Balance training;Manual  techniques;Neuromuscular re-education;Patient/family education;Orthotic Fit/Training;Taping;Passive range of motion   PT Next Visit Plan ankle 4 way review, marble pick up, DF ROM/gastroc stretch    PT Home Exercise Plan HEP: towel scrunch; ankle 4 way, ice massage    Consulted and Agree with Plan  of Care Patient      Patient will benefit from skilled therapeutic intervention in order to improve the following deficits and impairments:  Abnormal gait, Decreased activity tolerance, Decreased balance, Decreased strength, Difficulty walking, Pain, Impaired flexibility, Increased edema, Decreased range of motion, Decreased mobility, Postural dysfunction  Visit Diagnosis: Pain in right ankle and joints of right foot  Stiffness of right ankle, not elsewhere classified  Muscle weakness (generalized)  Other abnormalities of gait and mobility  Unsteadiness on feet     Problem List Patient Active Problem List   Diagnosis Date Noted  . Borderline diabetes 07/02/2015  . Vitamin D deficiency 07/02/2015  . Lumbar back pain 07/02/2015  . Acute meniscal tear of left knee 12/23/2013  . Bursitis of left knee 11/03/2013  . Insomnia 10/29/2013  . Sciatica of left side 10/13/2013  . Arthritis of knee, degenerative 10/13/2013  . Left knee pain 10/13/2013  . Pain, joint, multiple sites 04/15/2013  . Arthritis 12/02/2012  . Headache(784.0) 09/25/2012  . Hand dermatitis 03/02/2012  . Essential hypertension, benign 09/21/2011  . Obesity 09/21/2011  . Shoulder pain, right 09/21/2011  . Asthma, intermittent 09/21/2011  . Tobacco user 09/21/2011  . TRIGGER FINGER 11/03/2008   9:00 AM,03/09/16 Elly Modena PT, DPT [facility] [place] [address] [place], [place], [address] Phone: [phone]   Fax:  [phone]  Name: Robin Sherman MRN: [id number] Date of Birth: 09/22/1960

## 2016-03-14 ENCOUNTER — Ambulatory Visit (HOSPITAL_COMMUNITY): Payer: BLUE CROSS/BLUE SHIELD | Admitting: Physical Therapy

## 2016-03-14 DIAGNOSIS — R2689 Other abnormalities of gait and mobility: Secondary | ICD-10-CM

## 2016-03-14 DIAGNOSIS — M6281 Muscle weakness (generalized): Secondary | ICD-10-CM

## 2016-03-14 DIAGNOSIS — M25571 Pain in right ankle and joints of right foot: Secondary | ICD-10-CM | POA: Diagnosis not present

## 2016-03-14 DIAGNOSIS — M25671 Stiffness of right ankle, not elsewhere classified: Secondary | ICD-10-CM

## 2016-03-14 DIAGNOSIS — R2681 Unsteadiness on feet: Secondary | ICD-10-CM

## 2016-03-14 NOTE — Therapy (Signed)
[place], [place], [address] Phone: [phone]   Fax:  [phone]  Physical Therapy Treatment  Patient Details  Name: Robin Sherman MRN: [id number] Date of Birth: 04-Apr-1961 Referring Provider: Arther Abbott, MD  Encounter Date: 03/14/2016      PT End of Session - 03/14/16 0908    Visit Number 3   Number of Visits 12   Date for PT Re-Evaluation 03/29/16   Authorization Type BCBS   Authorization Time Period 03/08/16 to 04/21/16   PT Start Time 0816   PT Stop Time 0900   PT Time Calculation (min) 44 min   Activity Tolerance Patient tolerated treatment well   Behavior During Therapy [facility] for tasks assessed/performed      Past Medical History:  Diagnosis Date  . Asthma   . Eczema    Followed by Dr. Nevada Crane dermatology  . Hypertension     Past Surgical History:  Procedure Laterality Date  . ABDOMINAL HYSTERECTOMY    . BILATERAL SALPINGECTOMY Bilateral 08/01/2013   Procedure: BILATERAL SALPINGECTOMY;  Surgeon: Jonnie Kind, MD;  [facility];  Service: Gynecology;  Laterality: Bilateral;  . CHONDROPLASTY Left 01/14/2014   Procedure: CHONDROPLASTY OF FEMUR;  Surgeon: Carole Civil, MD;  Location: [facility];  Service: Orthopedics;  Laterality: Left;  . COLONOSCOPY N/A 01/27/2013   Procedure: COLONOSCOPY;  Surgeon: Danie Binder, MD;  [facility];  Service: Endoscopy;  Laterality: N/A;  8:30 AM  . HEMATOMA EVACUATION N/A 08/03/2013   Procedure: EVACUATION PELVIC HEMATOMA;  Surgeon: Jonnie Kind, MD;  Location: [facility];  Service: Gynecology;  Laterality: N/A;  . KNEE ARTHROSCOPY WITH MEDIAL MENISECTOMY Left 01/14/2014   Procedure: KNEE ARTHROSCOPY WITH PARTIAL MEDIAL MENISECTOMY;  Surgeon: Carole Civil, MD;  [facility];  Service: Orthopedics;  Laterality: Left;  . SUPRACERVICAL ABDOMINAL HYSTERECTOMY N/A 08/01/2013   Procedure: HYSTERECTOMY SUPRACERVICAL ABDOMINAL;  Surgeon: Jonnie Kind, MD;  [facility]: [facility];  Service: Gynecology;  Laterality: N/A;  . TUBAL LIGATION     [place]    There were no vitals filed for this visit.      Subjective Assessment - 03/14/16 0822    Subjective Pt arrives stating she was doing good until she worked [employer] through [employer]. Her foot was bothering her after about 7 hours. She has been doing her exercises regularly.    Pertinent History Asthma, HTN   Limitations Walking   How long can you sit comfortably? unlimited   How long can you stand comfortably? unsure   How long can you walk comfortably? 30 minutes   Diagnostic tests Xray: negative for fracture, (+) bone spur    Patient Stated Goals improve pain and mobility   Currently in Pain? Yes   Pain Score 4    Pain Location Heel   Pain Orientation Right   Pain Descriptors / Indicators Sharp   Pain Type Acute pain   Pain Radiating Towards none    Pain Onset More than a month ago   Pain Frequency Intermittent   Aggravating Factors  morning, weight bearing    Pain Relieving Factors rest, ice    Effect of Pain on Daily Activities pain during work    Multiple Pain Sites No                         [facility] Adult PT Treatment/Exercise - 03/14/16 0001  Manual Therapy   Manual Therapy Soft tissue mobilization   Manual therapy comments separate from all other interventions    Soft tissue mobilization ice massage Rt medial/inferior heel     Ankle Exercises: Seated   Towel Crunch 3 reps     Ankle Exercises: Supine   T-Band ankle 3 way (inversion, DF, eversion), RLE with green TB x20 reps      Ankle Exercises: Standing   Heel Raises 10 reps;Other (comment)  single leg, each LE   Toe Raise 10 reps  x2 sets, heavy verbal/tactile cues for correct technique                PT Education - 03/14/16 0907    Education provided Yes   Education Details importance of adjusting time spent on her feet while at work to prevent increased irritation of  her heel; HEP adherence and technique with therex   Person(s) Educated Patient   Methods Explanation;Demonstration;Verbal cues   Comprehension Verbalized understanding;Returned demonstration          PT Short Term Goals - 03/08/16 1532      PT SHORT TERM GOAL #1   Title Pt will demo consistency and independence with her HEP to improve strength and mobility.   Time 2   Period Weeks   Status New     PT SHORT TERM GOAL #2   Title Pt will report no greater than a 6/10 max pain during activity.   Time 3   Period Weeks   Status New     PT SHORT TERM GOAL #3   Title Pt will demo improved ankle DF to atleast 10 degrees to improve mechanics during gait.    Time 3   Period Weeks   Status New           PT Long Term Goals - 03/08/16 1534      PT LONG TERM GOAL #1   Title Pt will demo improved BLE strength of 5/5 MMT (except ankle PF) to increase safety with mobility at work.    Time 6   Period Weeks   Status New     PT LONG TERM GOAL #2   Title Pt will report improved activity tolerance evident by her ability to ambulate for up to 2 hours at work without more than 3/10 pain.    Time 6   Period Weeks   Status New     PT LONG TERM GOAL #3   Title Pt will demo improved balance evident by her ability to maintain SLS on each LE for up to 15 sec, 3/5 trials.    Time 6   Period Weeks   Status New     PT LONG TERM GOAL #4   Title Pt will demo improved gastroc strength up to atleast 4/5 MMT, in order to assist during gait.   Time 6   Period Weeks   Status New               Plan - 03/14/16 0910    Clinical Impression Statement Today's session technique with HEP was reviewed and pt was able to perform with occasional verbal cues for proper form. She demonstrates increased difficulty with big toe flexion/extension activity and sequencing secondary to weakness and poor neuromuscular control. Encouraged pt to continue with full HEP adherence and she verbalized  understanding.    Rehab Potential Good   Clinical Impairments Affecting Rehab Potential (-) working 12 hour days on her feet  PT Frequency 2x / week   PT Duration 6 weeks   PT Treatment/Interventions ADLs/Self Care Home Management;Cryotherapy;Therapeutic exercise;Therapeutic activities;Functional mobility training;Stair training;Gait training;Balance training;Manual techniques;Neuromuscular re-education;Patient/family education;Orthotic Fit/Training;Taping;Passive range of motion   PT Next Visit Plan ankle 4 way review, marble pick up, DF ROM/gastroc stretch    PT Home Exercise Plan HEP: towel scrunch; ankle 4 way, ice massage    Consulted and Agree with Plan of Care Patient      Patient will benefit from skilled therapeutic intervention in order to improve the following deficits and impairments:  Abnormal gait, Decreased activity tolerance, Decreased balance, Decreased strength, Difficulty walking, Pain, Impaired flexibility, Increased edema, Decreased range of motion, Decreased mobility, Postural dysfunction  Visit Diagnosis: Pain in right ankle and joints of right foot  Stiffness of right ankle, not elsewhere classified  Other abnormalities of gait and mobility  Muscle weakness (generalized)  Unsteadiness on feet     Problem List Patient Active Problem List   Diagnosis Date Noted  . Borderline diabetes 07/02/2015  . Vitamin D deficiency 07/02/2015  . Lumbar back pain 07/02/2015  . Acute meniscal tear of left knee 12/23/2013  . Bursitis of left knee 11/03/2013  . Insomnia 10/29/2013  . Sciatica of left side 10/13/2013  . Arthritis of knee, degenerative 10/13/2013  . Left knee pain 10/13/2013  . Pain, joint, multiple sites 04/15/2013  . Arthritis 12/02/2012  . Headache(784.0) 09/25/2012  . Hand dermatitis 03/02/2012  . Essential hypertension, benign 09/21/2011  . Obesity 09/21/2011  . Shoulder pain, right 09/21/2011  . Asthma, intermittent 09/21/2011  . Tobacco user  09/21/2011  . TRIGGER FINGER 11/03/2008    12:24 PM,03/14/16 Elly Modena PT, DPT [facility] [place] [address] [place], [place], [address] Phone: [phone]   Fax:  [phone]  Name: Maredith Slavick MRN: [id number] Date of Birth: 23-Dec-1960

## 2016-03-16 ENCOUNTER — Ambulatory Visit (HOSPITAL_COMMUNITY): Payer: BLUE CROSS/BLUE SHIELD | Admitting: Physical Therapy

## 2016-03-16 DIAGNOSIS — R2689 Other abnormalities of gait and mobility: Secondary | ICD-10-CM

## 2016-03-16 DIAGNOSIS — M6281 Muscle weakness (generalized): Secondary | ICD-10-CM

## 2016-03-16 DIAGNOSIS — R2681 Unsteadiness on feet: Secondary | ICD-10-CM

## 2016-03-16 DIAGNOSIS — M25571 Pain in right ankle and joints of right foot: Secondary | ICD-10-CM | POA: Diagnosis not present

## 2016-03-16 DIAGNOSIS — M25671 Stiffness of right ankle, not elsewhere classified: Secondary | ICD-10-CM

## 2016-03-16 NOTE — Therapy (Signed)
[place], [place], [address] Phone: [phone]   Fax:  [phone]  Physical Therapy Treatment  Patient Details  Name: Robin Sherman MRN: [id number] Date of Birth: 1960/10/20 Referring Provider: Arther Abbott, MD  Encounter Date: 03/16/2016      PT End of Session - 03/16/16 0924    Visit Number 4   Number of Visits 12   Date for PT Re-Evaluation 03/29/16   Authorization Type BCBS   Authorization Time Period 03/08/16 to 04/21/16   PT Start Time 0817   PT Stop Time 0859   PT Time Calculation (min) 42 min   Activity Tolerance Patient tolerated treatment well   Behavior During Therapy [facility] for tasks assessed/performed      Past Medical History:  Diagnosis Date  . Asthma   . Eczema    Followed by Dr. Nevada Crane dermatology  . Hypertension     Past Surgical History:  Procedure Laterality Date  . ABDOMINAL HYSTERECTOMY    . BILATERAL SALPINGECTOMY Bilateral 08/01/2013   Procedure: BILATERAL SALPINGECTOMY;  Surgeon: Jonnie Kind, MD;  [facility];  Service: Gynecology;  Laterality: Bilateral;  . CHONDROPLASTY Left 01/14/2014   Procedure: CHONDROPLASTY OF FEMUR;  Surgeon: Carole Civil, MD;  [facility];  Service: Orthopedics;  Laterality: Left;  . COLONOSCOPY N/A 01/27/2013   Procedure: COLONOSCOPY;  Surgeon: Danie Binder, MD;  [facility];  Service: Endoscopy;  Laterality: N/A;  8:30 AM  . HEMATOMA EVACUATION N/A 08/03/2013   Procedure: EVACUATION PELVIC HEMATOMA;  Surgeon: Jonnie Kind, MD;  [facility]: [facility];  Service: Gynecology;  Laterality: N/A;  . KNEE ARTHROSCOPY WITH MEDIAL MENISECTOMY Left 01/14/2014   Procedure: KNEE ARTHROSCOPY WITH PARTIAL MEDIAL MENISECTOMY;  Surgeon: Carole Civil, MD;  [facility];  Service: Orthopedics;  Laterality: Left;  . SUPRACERVICAL ABDOMINAL HYSTERECTOMY N/A 08/01/2013   Procedure: HYSTERECTOMY SUPRACERVICAL ABDOMINAL;  Surgeon: Jonnie Kind, MD;  [facility];  Service: Gynecology;  Laterality: N/A;  . TUBAL LIGATION     [place]    There were no vitals filed for this visit.      Subjective Assessment - 03/16/16 0819    Subjective Pt arrives with increased pain having been on her feet more yesterday. She states her pain is a 10/10 currentlt.    Pertinent History Asthma, HTN   Limitations Walking   How long can you sit comfortably? unlimited   How long can you stand comfortably? unsure   How long can you walk comfortably? 30 minutes   Diagnostic tests Xray: negative for fracture, (+) bone spur    Patient Stated Goals improve pain and mobility   Currently in Pain? Yes   Pain Score 10-Worst pain ever   Pain Location Heel   Pain Orientation Right   Pain Descriptors / Indicators Sharp   Pain Type Acute pain   Pain Radiating Towards pain in calf today   Pain Onset More than a month ago   Pain Frequency Other (Comment)  weight bearing    Aggravating Factors  standing   Pain Relieving Factors rest and ice                         [facility] Adult PT Treatment/Exercise - 03/16/16 0001      Manual Therapy   Manual Therapy Soft tissue mobilization;Joint mobilization   Manual therapy comments separate from all other interventions  Joint Mobilization Rt MTP ext grade III-IV jt mobs    Soft tissue mobilization STM Rt gastroc/peroneals; STM plantar fascia; ice massage plantar fascia insertion     Ankle Exercises: Seated   Toe Raise 15 reps;5 seconds  Rt MTP ext, tactile cues needed   Other Seated Ankle Exercises B toe flexion into floor x15, 5 sec hold     Ankle Exercises: Stretches   Gastroc Stretch 3 reps;30 seconds   Gastroc Stretch Limitations slant board      Ankle Exercises: Supine   T-Band ankle 4 way, blue TB x20 reps, RLE                 PT Education - 03/16/16 0917    Education provided Yes   Education Details importance of HEP adherence; encouraged pt to ice  foot prior to work and after to address swelling/inflammation   Person(s) Educated Patient   Methods Explanation   Comprehension Verbalized understanding;Need further instruction          PT Short Term Goals - 03/08/16 1532      PT SHORT TERM GOAL #1   Title Pt will demo consistency and independence with her HEP to improve strength and mobility.   Time 2   Period Weeks   Status New     PT SHORT TERM GOAL #2   Title Pt will report no greater than a 6/10 max pain during activity.   Time 3   Period Weeks   Status New     PT SHORT TERM GOAL #3   Title Pt will demo improved ankle DF to atleast 10 degrees to improve mechanics during gait.    Time 3   Period Weeks   Status New           PT Long Term Goals - 03/08/16 1534      PT LONG TERM GOAL #1   Title Pt will demo improved BLE strength of 5/5 MMT (except ankle PF) to increase safety with mobility at work.    Time 6   Period Weeks   Status New     PT LONG TERM GOAL #2   Title Pt will report improved activity tolerance evident by her ability to ambulate for up to 2 hours at work without more than 3/10 pain.    Time 6   Period Weeks   Status New     PT LONG TERM GOAL #3   Title Pt will demo improved balance evident by her ability to maintain SLS on each LE for up to 15 sec, 3/5 trials.    Time 6   Period Weeks   Status New     PT LONG TERM GOAL #4   Title Pt will demo improved gastroc strength up to atleast 4/5 MMT, in order to assist during gait.   Time 6   Period Weeks   Status New               Plan - 03/16/16 0841    Clinical Impression Statement Pt arrived with increased pain today after working all night last night. Session continued with exercise progression to address activation of intrinsic foot muscles. Pt continues to have difficulty with 1st MTP ext on the Lt secondary to weakness and limited ROM. Ended session with manual to improve ROM and address high levels of pain. Pt reporting some  improvement post session.    Rehab Potential Good   Clinical Impairments Affecting Rehab Potential (-) working 12 hour  days on her feet    PT Frequency 2x / week   PT Duration 6 weeks   PT Treatment/Interventions ADLs/Self Care Home Management;Cryotherapy;Therapeutic exercise;Therapeutic activities;Functional mobility training;Stair training;Gait training;Balance training;Manual techniques;Neuromuscular re-education;Patient/family education;Orthotic Fit/Training;Taping;Passive range of motion   PT Next Visit Plan updated HEP next visit if pt picks up on adherence; gastroc/toe ext stretch; marble pick up   PT Home Exercise Plan HEP: towel scrunch; ankle 4 way, ice massage    Consulted and Agree with Plan of Care Patient      Patient will benefit from skilled therapeutic intervention in order to improve the following deficits and impairments:  Abnormal gait, Decreased activity tolerance, Decreased balance, Decreased strength, Difficulty walking, Pain, Impaired flexibility, Increased edema, Decreased range of motion, Decreased mobility, Postural dysfunction  Visit Diagnosis: Pain in right ankle and joints of right foot  Stiffness of right ankle, not elsewhere classified  Other abnormalities of gait and mobility  Muscle weakness (generalized)  Unsteadiness on feet     Problem List Patient Active Problem List   Diagnosis Date Noted  . Borderline diabetes 07/02/2015  . Vitamin D deficiency 07/02/2015  . Lumbar back pain 07/02/2015  . Acute meniscal tear of left knee 12/23/2013  . Bursitis of left knee 11/03/2013  . Insomnia 10/29/2013  . Sciatica of left side 10/13/2013  . Arthritis of knee, degenerative 10/13/2013  . Left knee pain 10/13/2013  . Pain, joint, multiple sites 04/15/2013  . Arthritis 12/02/2012  . Headache(784.0) 09/25/2012  . Hand dermatitis 03/02/2012  . Essential hypertension, benign 09/21/2011  . Obesity 09/21/2011  . Shoulder pain, right 09/21/2011  .  Asthma, intermittent 09/21/2011  . Tobacco user 09/21/2011  . TRIGGER FINGER 11/03/2008    11:47 AM,03/16/16 Elly Modena PT, DPT [facility] [place] [address] [place], [place], [address] Phone: [phone]   Fax:  [phone]  Name: Gai Neveu MRN: [id number] Date of Birth: 07-30-1960

## 2016-03-17 ENCOUNTER — Other Ambulatory Visit: Payer: Self-pay | Admitting: Family Medicine

## 2016-03-21 ENCOUNTER — Ambulatory Visit (HOSPITAL_COMMUNITY): Payer: BLUE CROSS/BLUE SHIELD | Attending: Orthopedic Surgery

## 2016-03-21 DIAGNOSIS — M25671 Stiffness of right ankle, not elsewhere classified: Secondary | ICD-10-CM | POA: Diagnosis present

## 2016-03-21 DIAGNOSIS — R2689 Other abnormalities of gait and mobility: Secondary | ICD-10-CM | POA: Insufficient documentation

## 2016-03-21 DIAGNOSIS — M6281 Muscle weakness (generalized): Secondary | ICD-10-CM | POA: Diagnosis present

## 2016-03-21 DIAGNOSIS — R2681 Unsteadiness on feet: Secondary | ICD-10-CM | POA: Diagnosis present

## 2016-03-21 DIAGNOSIS — M25571 Pain in right ankle and joints of right foot: Secondary | ICD-10-CM

## 2016-03-21 NOTE — Therapy (Signed)
[place], [place], [address] Phone: [phone]   Fax:  [phone]  Physical Therapy Treatment  Patient Details  Name: Robin Sherman MRN: [id number] Date of Birth: March 26, 1961 Referring Provider: Arther Abbott, MD  Encounter Date: 03/21/2016      PT End of Session - 03/21/16 0823    Visit Number 5   Number of Visits 12   Date for PT Re-Evaluation 03/29/16   Authorization Type BCBS   Authorization Time Period 03/08/16 to 04/21/16   PT Start Time 0818   PT Stop Time 0858   PT Time Calculation (min) 40 min   Activity Tolerance Patient tolerated treatment well   Behavior During Therapy [facility] for tasks assessed/performed      Past Medical History:  Diagnosis Date  . Asthma   . Eczema    Followed by Dr. [place] Crane dermatology  . Hypertension     Past Surgical History:  Procedure Laterality Date  . ABDOMINAL HYSTERECTOMY    . BILATERAL SALPINGECTOMY Bilateral 08/01/2013   Procedure: BILATERAL SALPINGECTOMY;  Surgeon: Jonnie Kind, MD;  [facility];  Service: Gynecology;  Laterality: Bilateral;  . CHONDROPLASTY Left 01/14/2014   Procedure: CHONDROPLASTY OF FEMUR;  Surgeon: Carole Civil, MD;  [facility];  Service: Orthopedics;  Laterality: Left;  . COLONOSCOPY N/A 01/27/2013   Procedure: COLONOSCOPY;  Surgeon: Danie Binder, MD;  [facility];  Service: Endoscopy;  Laterality: N/A;  8:30 AM  . HEMATOMA EVACUATION N/A 08/03/2013   Procedure: EVACUATION PELVIC HEMATOMA;  Surgeon: Jonnie Kind, MD;  [facility];  Service: Gynecology;  Laterality: N/A;  . KNEE ARTHROSCOPY WITH MEDIAL MENISECTOMY Left 01/14/2014   Procedure: KNEE ARTHROSCOPY WITH PARTIAL MEDIAL MENISECTOMY;  Surgeon: Carole Civil, MD;  [facility];  Service: Orthopedics;  Laterality: Left;  . SUPRACERVICAL ABDOMINAL HYSTERECTOMY N/A 08/01/2013   Procedure: HYSTERECTOMY SUPRACERVICAL ABDOMINAL;  Surgeon: Jonnie Kind, MD;  [facility];  Service: Gynecology;  Laterality: N/A;  . TUBAL LIGATION     [place]    There were no vitals filed for this visit.      Subjective Assessment - 03/21/16 0821    Subjective Pt stated she feels a lot better this session, pain scale 3/10 on bottom of heel.     Pertinent History Asthma, HTN   Patient Stated Goals improve pain and mobility   Currently in Pain? Yes   Pain Score 3    Pain Location Heel   Pain Orientation Right   Pain Descriptors / Indicators Sharp   Pain Type Acute pain   Pain Radiating Towards no radicular symptoms   Pain Onset More than a month ago   Pain Frequency --  weight bearing   Aggravating Factors  standing   Pain Relieving Factors rest and ice   Effect of Pain on Daily Activities pain during work                         [facility] Adult PT Treatment/Exercise - 03/21/16 0001      Manual Therapy   Manual Therapy Soft tissue mobilization;Joint mobilization;Passive ROM   Manual therapy comments separate from all other interventions    Joint Mobilization Rt MTP ext grade III-IV jt mobs    Soft tissue mobilization STM Rt gastroc/peroneals; STM plantar fascia; ice massage plantar fascia insertion   Passive ROM PROM each direction  Ankle Exercises: Supine   T-Band ankle 4 way, blue TB x20 reps, RLE      Ankle Exercises: Seated   Towel Crunch 3 reps   Marble Pickup 10 marbles   Toe Raise 15 reps;5 seconds   Other Seated Ankle Exercises B toe flexion into floor x15, 5 sec hold     Ankle Exercises: Stretches   Gastroc Stretch 3 reps;30 seconds   Gastroc Stretch Limitations slant board                   PT Short Term Goals - 03/08/16 1532      PT SHORT TERM GOAL #1   Title Pt will demo consistency and independence with her HEP to improve strength and mobility.   Time 2   Period Weeks   Status New     PT SHORT TERM GOAL #2   Title Pt will report no greater than a 6/10 max pain  during activity.   Time 3   Period Weeks   Status New     PT SHORT TERM GOAL #3   Title Pt will demo improved ankle DF to atleast 10 degrees to improve mechanics during gait.    Time 3   Period Weeks   Status New           PT Long Term Goals - 03/08/16 1534      PT LONG TERM GOAL #1   Title Pt will demo improved BLE strength of 5/5 MMT (except ankle PF) to increase safety with mobility at work.    Time 6   Period Weeks   Status New     PT LONG TERM GOAL #2   Title Pt will report improved activity tolerance evident by her ability to ambulate for up to 2 hours at work without more than 3/10 pain.    Time 6   Period Weeks   Status New     PT LONG TERM GOAL #3   Title Pt will demo improved balance evident by her ability to maintain SLS on each LE for up to 15 sec, 3/5 trials.    Time 6   Period Weeks   Status New     PT LONG TERM GOAL #4   Title Pt will demo improved gastroc strength up to atleast 4/5 MMT, in order to assist during gait.   Time 6   Period Weeks   Status New               Plan - 03/21/16 [id number]    Clinical Impression Statement Pt arrived with reports of decreased pain today and reports of compliance with HEP daily.  Session focus on exercises to address activation and strengthening of instrinsic foot musculature.  Added marble pick up with minimal difficulty.  Pt does continue to have difficulty with 1st MTP extension due to weakness and limited ROM.Marland Kitchen  Ended session with manual to improve ROM and reduce pain.  Pt reports pain minimal at EOS.   Rehab Potential Good   Clinical Impairments Affecting Rehab Potential (-) working 12 hour days on her feet    PT Frequency 2x / week   PT Duration 6 weeks   PT Treatment/Interventions ADLs/Self Care Home Management;Cryotherapy;Therapeutic exercise;Therapeutic activities;Functional mobility training;Stair training;Gait training;Balance training;Manual techniques;Neuromuscular re-education;Patient/family  education;Orthotic Fit/Training;Taping;Passive range of motion   PT Next Visit Plan updated HEP next visit if pt picks up on adherence; gastroc/toe ext stretch      Patient will benefit from skilled therapeutic  intervention in order to improve the following deficits and impairments:  Abnormal gait, Decreased activity tolerance, Decreased balance, Decreased strength, Difficulty walking, Pain, Impaired flexibility, Increased edema, Decreased range of motion, Decreased mobility, Postural dysfunction  Visit Diagnosis: Pain in right ankle and joints of right foot  Stiffness of right ankle, not elsewhere classified  Other abnormalities of gait and mobility  Muscle weakness (generalized)  Unsteadiness on feet     Problem List Patient Active Problem List   Diagnosis Date Noted  . Borderline diabetes 07/02/2015  . Vitamin D deficiency 07/02/2015  . Lumbar back pain 07/02/2015  . Acute meniscal tear of left knee 12/23/2013  . Bursitis of left knee 11/03/2013  . Insomnia 10/29/2013  . Sciatica of left side 10/13/2013  . Arthritis of knee, degenerative 10/13/2013  . Left knee pain 10/13/2013  . Pain, joint, multiple sites 04/15/2013  . Arthritis 12/02/2012  . Headache(784.0) 09/25/2012  . Hand dermatitis 03/02/2012  . Essential hypertension, benign 09/21/2011  . Obesity 09/21/2011  . Shoulder pain, right 09/21/2011  . Asthma, intermittent 09/21/2011  . Tobacco user 09/21/2011  . TRIGGER FINGER 11/03/2008   Ihor Austin, [place]; [place]  Aldona Lento 03/21/2016, 9:28 AM  [place], [place], [address] Phone: [phone]   Fax:  [phone]  Name: Jhoseline Lampron MRN: [id number] Date of Birth: 04-19-1961

## 2016-03-23 ENCOUNTER — Ambulatory Visit (HOSPITAL_COMMUNITY): Payer: BLUE CROSS/BLUE SHIELD | Admitting: Physical Therapy

## 2016-03-23 DIAGNOSIS — M25671 Stiffness of right ankle, not elsewhere classified: Secondary | ICD-10-CM

## 2016-03-23 DIAGNOSIS — M25571 Pain in right ankle and joints of right foot: Secondary | ICD-10-CM | POA: Diagnosis not present

## 2016-03-23 DIAGNOSIS — M6281 Muscle weakness (generalized): Secondary | ICD-10-CM

## 2016-03-23 DIAGNOSIS — R2681 Unsteadiness on feet: Secondary | ICD-10-CM

## 2016-03-23 DIAGNOSIS — R2689 Other abnormalities of gait and mobility: Secondary | ICD-10-CM

## 2016-03-23 NOTE — Therapy (Signed)
[place], [place], [address] Phone: [phone]   Fax:  [phone]  Physical Therapy Treatment  Patient Details  Name: Robin Sherman MRN: [id number] Date of Birth: Sep 15, 1960 Referring Provider: Arther Abbott, MD  Encounter Date: 03/23/2016      PT End of Session - 03/23/16 0901    Visit Number 6   Number of Visits 12   Date for PT Re-Evaluation 03/29/16   Authorization Type BCBS   Authorization Time Period 03/08/16 to 04/21/16   PT Start Time 0816   PT Stop Time 0855   PT Time Calculation (min) 39 min   Activity Tolerance Patient tolerated treatment well   Behavior During Therapy [facility] for tasks assessed/performed      Past Medical History:  Diagnosis Date  . Asthma   . Eczema    Followed by Dr. Nevada Crane dermatology  . Hypertension     Past Surgical History:  Procedure Laterality Date  . ABDOMINAL HYSTERECTOMY    . BILATERAL SALPINGECTOMY Bilateral 08/01/2013   Procedure: BILATERAL SALPINGECTOMY;  Surgeon: Jonnie Kind, MD;  Location: [facility];  Service: Gynecology;  Laterality: Bilateral;  . CHONDROPLASTY Left 01/14/2014   Procedure: CHONDROPLASTY OF FEMUR;  Surgeon: Carole Civil, MD;  Location: [facility];  Service: Orthopedics;  Laterality: Left;  . COLONOSCOPY N/A 01/27/2013   Procedure: COLONOSCOPY;  Surgeon: Danie Binder, MD;  [facility];  Service: Endoscopy;  Laterality: N/A;  8:30 AM  . HEMATOMA EVACUATION N/A 08/03/2013   Procedure: EVACUATION PELVIC HEMATOMA;  Surgeon: Jonnie Kind, MD;  Location: [facility];  Service: Gynecology;  Laterality: N/A;  . KNEE ARTHROSCOPY WITH MEDIAL MENISECTOMY Left 01/14/2014   Procedure: KNEE ARTHROSCOPY WITH PARTIAL MEDIAL MENISECTOMY;  Surgeon: Carole Civil, MD;  Location: [facility];  Service: Orthopedics;  Laterality: Left;  . SUPRACERVICAL ABDOMINAL HYSTERECTOMY N/A 08/01/2013   Procedure: HYSTERECTOMY SUPRACERVICAL ABDOMINAL;  Surgeon: Jonnie Kind, MD;  Location: [facility];  Service: Gynecology;  Laterality: N/A;  . TUBAL LIGATION     [place]    There were no vitals filed for this visit.      Subjective Assessment - 03/23/16 0821    Subjective Pt states that she is doing good today. No pain currently. She reports that she was sore and had difficutly walking after her session last Tuesday, but this has improved significantly.   Pertinent History Asthma, HTN   Patient Stated Goals improve pain and mobility   Pain Onset More than a month ago                         [facility] Adult PT Treatment/Exercise - 03/23/16 0001      Manual Therapy   Manual Therapy Soft tissue mobilization;Joint mobilization;Passive ROM  ice application to Rt achilles origin    Manual therapy comments separate from all other interventions    Joint Mobilization Rt MTP ext grade III-IV jt mobs    Soft tissue mobilization STM Rt achilles   Passive ROM PROM Rt 1st MTP ext 8x10 sec     Ankle Exercises: Seated   Other Seated Ankle Exercises B 1st MTP extension 15 x3 sec hold   Other Seated Ankle Exercises BLE arch lifts 10x5 sec     Ankle Exercises: Supine   T-Band ankle 4 way, blue TB x20 reps, RLE      Ankle Exercises: Standing   Heel Raises 20  reps;2 seconds   Other Standing Ankle Exercises ankle inversion from slight eversion, 3x6 RLE; x20 LLE     Ankle Exercises: Stretches   Gastroc Stretch 3 reps;30 seconds   Gastroc Stretch Limitations slant board                 PT Education - 03/23/16 0900    Education provided Yes   Education Details expected soreness up to 24 hours following therex secondary to muscle fatigue; encouraged continued HEP; updated HEP; noted progress    Person(s) Educated Patient   Methods Explanation;Demonstration;Handout   Comprehension Verbalized understanding;Returned demonstration          PT Short Term Goals - 03/08/16 1532      PT SHORT TERM GOAL #1   Title Pt will demo  consistency and independence with her HEP to improve strength and mobility.   Time 2   Period Weeks   Status New     PT SHORT TERM GOAL #2   Title Pt will report no greater than a 6/10 max pain during activity.   Time 3   Period Weeks   Status New     PT SHORT TERM GOAL #3   Title Pt will demo improved ankle DF to atleast 10 degrees to improve mechanics during gait.    Time 3   Period Weeks   Status New           PT Long Term Goals - 03/08/16 1534      PT LONG TERM GOAL #1   Title Pt will demo improved BLE strength of 5/5 MMT (except ankle PF) to increase safety with mobility at work.    Time 6   Period Weeks   Status New     PT LONG TERM GOAL #2   Title Pt will report improved activity tolerance evident by her ability to ambulate for up to 2 hours at work without more than 3/10 pain.    Time 6   Period Weeks   Status New     PT LONG TERM GOAL #3   Title Pt will demo improved balance evident by her ability to maintain SLS on each LE for up to 15 sec, 3/5 trials.    Time 6   Period Weeks   Status New     PT LONG TERM GOAL #4   Title Pt will demo improved gastroc strength up to atleast 4/5 MMT, in order to assist during gait.   Time 6   Period Weeks   Status New               Plan - 03/23/16 0837    Clinical Impression Statement Pt is making progress towards her goasl with decreased report of pain upon arrival to today's session. Also noting improved neuromuscular control of her RLE evident by improved technique with therex and requiring minimal cues for proper form. Ended session with manual techniques to address jt limitations and pt reporting pain free at end of treatment.    Rehab Potential Good   Clinical Impairments Affecting Rehab Potential (-) working 12 hour days on her feet    PT Frequency 2x / week   PT Duration 6 weeks   PT Treatment/Interventions ADLs/Self Care Home Management;Cryotherapy;Therapeutic exercise;Therapeutic activities;Functional  mobility training;Stair training;Gait training;Balance training;Manual techniques;Neuromuscular re-education;Patient/family education;Orthotic Fit/Training;Taping;Passive range of motion   PT Next Visit Plan manual to address limited ankle/1st MTP ext; progression of ankle strength in closed chain positions as tolerated; continue to address MTP  ext/flexion strength and endurance; seated BAPS board    PT Home Exercise Plan HEP: towel scrunch; ankle 4 way blue TB, ice massage, 1st MTP ext stretch    Recommended Other Services none       Patient will benefit from skilled therapeutic intervention in order to improve the following deficits and impairments:  Abnormal gait, Decreased activity tolerance, Decreased balance, Decreased strength, Difficulty walking, Pain, Impaired flexibility, Increased edema, Decreased range of motion, Decreased mobility, Postural dysfunction  Visit Diagnosis: Pain in right ankle and joints of right foot  Stiffness of right ankle, not elsewhere classified  Other abnormalities of gait and mobility  Muscle weakness (generalized)  Unsteadiness on feet     Problem List Patient Active Problem List   Diagnosis Date Noted  . Borderline diabetes 07/02/2015  . Vitamin D deficiency 07/02/2015  . Lumbar back pain 07/02/2015  . Acute meniscal tear of left knee 12/23/2013  . Bursitis of left knee 11/03/2013  . Insomnia 10/29/2013  . Sciatica of left side 10/13/2013  . Arthritis of knee, degenerative 10/13/2013  . Left knee pain 10/13/2013  . Pain, joint, multiple sites 04/15/2013  . Arthritis 12/02/2012  . Headache(784.0) 09/25/2012  . Hand dermatitis 03/02/2012  . Essential hypertension, benign 09/21/2011  . Obesity 09/21/2011  . Shoulder pain, right 09/21/2011  . Asthma, intermittent 09/21/2011  . Tobacco user 09/21/2011  . TRIGGER FINGER 11/03/2008   9:02 AM,03/23/16 Elly Modena PT, DPT [facility] [place] [address] [place], [place], [address] Phone: [phone]   Fax:  [phone]  Name: Meliss Hanahan MRN: [id number] Date of Birth: 1961/03/29

## 2016-03-28 ENCOUNTER — Ambulatory Visit (HOSPITAL_COMMUNITY): Payer: BLUE CROSS/BLUE SHIELD | Admitting: Physical Therapy

## 2016-03-28 DIAGNOSIS — M25671 Stiffness of right ankle, not elsewhere classified: Secondary | ICD-10-CM

## 2016-03-28 DIAGNOSIS — R2681 Unsteadiness on feet: Secondary | ICD-10-CM

## 2016-03-28 DIAGNOSIS — R2689 Other abnormalities of gait and mobility: Secondary | ICD-10-CM

## 2016-03-28 DIAGNOSIS — M25571 Pain in right ankle and joints of right foot: Secondary | ICD-10-CM

## 2016-03-28 DIAGNOSIS — M6281 Muscle weakness (generalized): Secondary | ICD-10-CM

## 2016-03-28 NOTE — Patient Instructions (Addendum)
Balance: Unilateral    Attempt to balance on right leg, eyes open. Hold __5-60 __ seconds. Repeat __3-5_ times per set. Do __1__ sets per session. Do __1-2__ sessions per day. Perform exercise with eyes closed.2  http://orth.exer.us/28   Copyright  VHI. All rights reserved.  Plantar Fascia Stretch    Standing with only ball of right  foot on stair, push heel down until stretch is felt through arch of foot. Hold _30_ seconds. Relax. Repeat __3__ times per set. Do _1___ sets per session. Do ____2 sessions per day.  http://orth.exer.us/22   Copyright  VHI. All rights reserved.

## 2016-03-28 NOTE — Therapy (Signed)
[place], [place], [address] Phone: [phone]   Fax:  [phone]  Wound Care Therapy  Patient Details  Name: Robin Sherman MRN: [id number] Date of Birth: 03-02-1961 Referring Provider: Arther Abbott, MD  Encounter Date: 03/28/2016      PT End of Session - 03/28/16 0832    Visit Number 7   Number of Visits 12   Date for PT Re-Evaluation 03/29/16   Authorization Type BCBS   Authorization Time Period 03/08/16 to 04/21/16   Authorization - Visit Number 7   Authorization - Number of Visits 12   PT Start Time 0816   PT Stop Time 0900   PT Time Calculation (min) 44 min   Activity Tolerance Patient tolerated treatment well   Behavior During Therapy [facility] for tasks assessed/performed      Past Medical History:  Diagnosis Date  . Asthma   . Eczema    Followed by Dr. Nevada Crane dermatology  . Hypertension     Past Surgical History:  Procedure Laterality Date  . ABDOMINAL HYSTERECTOMY    . BILATERAL SALPINGECTOMY Bilateral 08/01/2013   Procedure: BILATERAL SALPINGECTOMY;  Surgeon: Jonnie Kind, MD;  [facility];  Service: Gynecology;  Laterality: Bilateral;  . CHONDROPLASTY Left 01/14/2014   Procedure: CHONDROPLASTY OF FEMUR;  Surgeon: Carole Civil, MD;  Location: [facility];  Service: Orthopedics;  Laterality: Left;  . COLONOSCOPY N/A 01/27/2013   Procedure: COLONOSCOPY;  Surgeon: Danie Binder, MD;  [facility];  Service: Endoscopy;  Laterality: N/A;  8:30 AM  . HEMATOMA EVACUATION N/A 08/03/2013   Procedure: EVACUATION PELVIC HEMATOMA;  Surgeon: Jonnie Kind, MD;  [facility]: [facility];  Service: Gynecology;  Laterality: N/A;  . KNEE ARTHROSCOPY WITH MEDIAL MENISECTOMY Left 01/14/2014   Procedure: KNEE ARTHROSCOPY WITH PARTIAL MEDIAL MENISECTOMY;  Surgeon: Carole Civil, MD;  Location: [facility];  Service: Orthopedics;  Laterality: Left;  . SUPRACERVICAL ABDOMINAL HYSTERECTOMY N/A 08/01/2013   Procedure: HYSTERECTOMY SUPRACERVICAL ABDOMINAL;  Surgeon: Jonnie Kind, MD;  Location: [facility];  Service: Gynecology;  Laterality: N/A;  . TUBAL LIGATION     [place]    There were no vitals filed for this visit.       Subjective Assessment - 03/28/16 0822    Subjective Pt states that she is able to do most everything she just has increased pain with it.  Most all of her pain is gone now except for on the bottom of her foot on her arch.    Pertinent History Asthma, HTN   Limitations Walking   How long can you sit comfortably? unlimited   How long can you stand comfortably? unsure   How long can you walk comfortably? 30 minutes   Diagnostic tests Xray: negative for fracture, (+) bone spur    Patient Stated Goals improve pain and mobility   Currently in Pain? Yes   Pain Score 2    Pain Location Ankle   Pain Orientation Right   Pain Descriptors / Indicators Aching   Pain Type Chronic pain   Pain Onset More than a month ago   Pain Frequency Intermittent   Aggravating Factors  walking    Pain Relieving Factors staying off of her foot    Effect of Pain on Daily Activities increases                    [facility] Adult PT Treatment/Exercise - 03/28/16 0001  Exercises   Exercises Ankle     Manual Therapy   Manual Therapy Soft tissue mobilization;Joint mobilization;Passive ROM     Manual therapy comments separate from all other interventions    Joint Mobilization Rt MTP ext grade III-IV jt mobs    Soft tissue mobilization STM Rt achilles   Passive ROM PROM Rt 1st MTP     Ankle Exercises: Stretches   Plantar Fascia Stretch 3 reps;30 seconds   Gastroc Stretch 3 reps;30 seconds   Gastroc Stretch Limitations slant board      Ankle Exercises: Standing   SLS 3x   Toe Raise 10 reps     Ankle Exercises: Seated   Toe Raise 15 reps  heelraise    BAPS Sitting;Level 2;10 reps     Ankle Exercises: Supine   T-Band home                 PT Education  - 03/28/16 0840    Education provided Yes   Education Details new advance HEP; the importance of working on your balance.    Person(s) Educated Patient   Methods Explanation   Comprehension Verbalized understanding          PT Short Term Goals - 03/08/16 1532      PT SHORT TERM GOAL #1   Title Pt will demo consistency and independence with her HEP to improve strength and mobility.   Time 2   Period Weeks   Status New     PT SHORT TERM GOAL #2   Title Pt will report no greater than a 6/10 max pain during activity.   Time 3   Period Weeks   Status New     PT SHORT TERM GOAL #3   Title Pt will demo improved ankle DF to atleast 10 degrees to improve mechanics during gait.    Time 3   Period Weeks   Status New           PT Long Term Goals - 03/08/16 1534      PT LONG TERM GOAL #1   Title Pt will demo improved BLE strength of 5/5 MMT (except ankle PF) to increase safety with mobility at work.    Time 6   Period Weeks   Status New     PT LONG TERM GOAL #2   Title Pt will report improved activity tolerance evident by her ability to ambulate for up to 2 hours at work without more than 3/10 pain.    Time 6   Period Weeks   Status New     PT LONG TERM GOAL #3   Title Pt will demo improved balance evident by her ability to maintain SLS on each LE for up to 15 sec, 3/5 trials.    Time 6   Period Weeks   Status New     PT LONG TERM GOAL #4   Title Pt will demo improved gastroc strength up to atleast 4/5 MMT, in order to assist during gait.   Time 6   Period Weeks   Status New               Plan - 03/28/16 [id number]    Clinical Impression Statement Many of pt symptoms have resolved.  Pt main complaints are now indicative of plantar fascitis.  Manual competed at end of session concentrating on relaxing tension in gastroc soleus complex. Added sitting BAPs with verbal and manual cuing to prevent hip from taking over task.  SLS  to improve strength and proprioception  as well as plantar stretch for release of fascial tension.    Rehab Potential Good   Clinical Impairments Affecting Rehab Potential (-) working 12 hour days on her feet    PT Frequency 2x / week   PT Duration 6 weeks   PT Treatment/Interventions ADLs/Self Care Home Management;Cryotherapy;Therapeutic exercise;Therapeutic activities;Functional mobility training;Stair training;Gait training;Balance training;Manual techniques;Neuromuscular re-education;Patient/family education;Orthotic Fit/Training;Taping;Passive range of motion   PT Next Visit Plan manual to address limited ankle/1st MTP ext; progression of ankle strength in closed chain positions as tolerated; continue to address MTP ext/flexion strength and endurance; seated BAPS board    PT Home Exercise Plan HEP: towel scrunch; ankle 4 way blue TB, ice massage, 1st MTP ext stretch; SLS, plantar stretch       Patient will benefit from skilled therapeutic intervention in order to improve the following deficits and impairments:  Abnormal gait, Decreased activity tolerance, Decreased balance, Decreased strength, Difficulty walking, Pain, Impaired flexibility, Increased edema, Decreased range of motion, Decreased mobility, Postural dysfunction  Visit Diagnosis: No diagnosis found.     Problem List Patient Active Problem List   Diagnosis Date Noted  . Borderline diabetes 07/02/2015  . Vitamin D deficiency 07/02/2015  . Lumbar back pain 07/02/2015  . Acute meniscal tear of left knee 12/23/2013  . Bursitis of left knee 11/03/2013  . Insomnia 10/29/2013  . Sciatica of left side 10/13/2013  . Arthritis of knee, degenerative 10/13/2013  . Left knee pain 10/13/2013  . Pain, joint, multiple sites 04/15/2013  . Arthritis 12/02/2012  . Headache(784.0) 09/25/2012  . Hand dermatitis 03/02/2012  . Essential hypertension, benign 09/21/2011  . Obesity 09/21/2011  . Shoulder pain, right 09/21/2011  . Asthma, intermittent 09/21/2011  . Tobacco user  09/21/2011  . TRIGGER FINGER 11/03/2008    Rayetta Humphrey, PT CLT [phone] 03/28/2016, 9:00 AM  [place], [place], [address] Phone: [phone]   Fax:  [phone]  Name: Robin Sherman MRN: [id number] Date of Birth: 10-26-1960

## 2016-03-30 ENCOUNTER — Telehealth (HOSPITAL_COMMUNITY): Payer: Self-pay | Admitting: Physical Therapy

## 2016-03-30 ENCOUNTER — Ambulatory Visit (HOSPITAL_COMMUNITY): Payer: BLUE CROSS/BLUE SHIELD | Admitting: Physical Therapy

## 2016-03-30 NOTE — Telephone Encounter (Signed)
No Show: [facility] reminding pt of missed apt this am. Notified her that she is not scheduled for anymore apts and encouraged her to call so that we can get her scheduled. Provided clinic #.   8:55 AM,03/30/16 Robin Sherman PT, [facility] [phone]

## 2016-04-03 ENCOUNTER — Ambulatory Visit (INDEPENDENT_AMBULATORY_CARE_PROVIDER_SITE_OTHER): Payer: BLUE CROSS/BLUE SHIELD | Admitting: Orthopedic Surgery

## 2016-04-03 ENCOUNTER — Encounter: Payer: Self-pay | Admitting: Orthopedic Surgery

## 2016-04-03 VITALS — BP 143/90 | HR 95 | Ht 64.5 in | Wt 226.0 lb

## 2016-04-03 DIAGNOSIS — M6789 Other specified disorders of synovium and tendon, multiple sites: Secondary | ICD-10-CM

## 2016-04-03 DIAGNOSIS — Z9889 Other specified postprocedural states: Secondary | ICD-10-CM | POA: Diagnosis not present

## 2016-04-03 DIAGNOSIS — M76829 Posterior tibial tendinitis, unspecified leg: Secondary | ICD-10-CM

## 2016-04-03 DIAGNOSIS — M25562 Pain in left knee: Secondary | ICD-10-CM

## 2016-04-03 DIAGNOSIS — M1712 Unilateral primary osteoarthritis, left knee: Secondary | ICD-10-CM | POA: Diagnosis not present

## 2016-04-03 MED ORDER — HYDROCODONE-ACETAMINOPHEN 7.5-325 MG PO TABS: 1.0000 | ORAL_TABLET | Freq: Three times a day (TID) | ORAL | 0 refills | Status: DC | PRN

## 2016-04-03 MED ORDER — IBUPROFEN 200 MG PO TABS: 400.0000 mg | ORAL_TABLET | Freq: Four times a day (QID) | ORAL | 0 refills | Status: DC | PRN

## 2016-04-03 NOTE — Progress Notes (Deleted)
Patient ID: Robin Sherman, female   DOB: May 13, 1961, 55 y.o.   MRN: [id number]  Chief Complaint  Patient presents with  . Follow-up    RIGHT FOOT, PTTD    HPI Robin Sherman is a 55 y.o. female.  *** HPI  Review of Systems Review of Systems Normal neuro  Denies fever   Past Medical History:  Diagnosis Date  . Asthma   . Eczema    Followed by Dr. [place] Crane dermatology  . Hypertension      Examination BP (!) 143/90   Pulse 95   Ht 5' 4.5" (1.638 m)   Wt 226 lb (102.5 kg)   LMP 04/15/2013   BMI 38.19 kg/m   Gen. appearance the patient's appearance is normal with normal grooming and hygiene The patient is oriented to person place and time Mood and affect are normal Sensation is  ***  Pulses are   ***  Ortho Exam   Medical decision-making  Diagnosis  Data  Plan (risk)   Arther Abbott, MD 04/03/2016 9:33 AM

## 2016-04-03 NOTE — Progress Notes (Signed)
Patient ID: Robin Sherman, female   DOB: 05/16/1961, 55 y.o.   MRN: [id number]  Chief Complaint  Patient presents with  . Follow-up    RIGHT FOOT, PTTD    HPI Robin Sherman is a 55 y.o. female.  Presents for follow-up after [facility] since June 2017 for posterior tibial tendon dysfunction she also had arthroscopy left knee in April and also requests reevaluation of ongoing pain and swelling left knee  Her posterior tibial tendon dysfunction on the right is still symptomatic despite NSAIDs and hydrocodone for pain in the Cam [place] which she's been treated with for total of 8 weeks  Review of Systems Review of Systems  Constitutional: Negative for activity change and chills.  Musculoskeletal: Positive for arthralgias.  Neurological: Negative for numbness.    Past Medical History:  Diagnosis Date  . Asthma   . Eczema    Followed by Dr. Nevada Crane dermatology  . Hypertension     Past Surgical History:  Procedure Laterality Date  . ABDOMINAL HYSTERECTOMY    . BILATERAL SALPINGECTOMY Bilateral 08/01/2013   Procedure: BILATERAL SALPINGECTOMY;  Surgeon: Jonnie Kind, MD;  [facility];  Service: Gynecology;  Laterality: Bilateral;  . CHONDROPLASTY Left 01/14/2014   Procedure: CHONDROPLASTY OF FEMUR;  Surgeon: Carole Civil, MD;  Location: [facility];  Service: Orthopedics;  Laterality: Left;  . COLONOSCOPY N/A 01/27/2013   Procedure: COLONOSCOPY;  Surgeon: Danie Binder, MD;  [facility];  Service: Endoscopy;  Laterality: N/A;  8:30 AM  . HEMATOMA EVACUATION N/A 08/03/2013   Procedure: EVACUATION PELVIC HEMATOMA;  Surgeon: Jonnie Kind, MD;  Location: [facility];  Service: Gynecology;  Laterality: N/A;  . KNEE ARTHROSCOPY WITH MEDIAL MENISECTOMY Left 01/14/2014   Procedure: KNEE ARTHROSCOPY WITH PARTIAL MEDIAL MENISECTOMY;  Surgeon: Carole Civil, MD;  Location: [facility];  Service: Orthopedics;  Laterality: Left;  . SUPRACERVICAL ABDOMINAL HYSTERECTOMY N/A 08/01/2013   Procedure: HYSTERECTOMY SUPRACERVICAL ABDOMINAL;  Surgeon: Jonnie Kind, MD;  Location: [facility];  Service: Gynecology;  Laterality: N/A;  . TUBAL LIGATION     [place]    Social History Social History  Substance Use Topics  . Smoking status: Former Smoker    Packs/day: 0.50    Years: 20.00    Types: Cigarettes  . Smokeless tobacco: Never Used  . Alcohol use 0.0 oz/week     Comment: Rare    Allergies  Allergen Reactions  . Codeine Hives  . Latex Rash  . Naproxen Rash    Patient tolerates Ibuprofen    Current Meds  Medication Sig  . albuterol (PROAIR HFA) 108 (90 Base) MCG/ACT inhaler INHALE 2 PUFFS INTO THE LUNGS EVERY 4 HOURS AS NEEDED FOR WHEEZING ORSHORTNESS OF BREATH.  Marland Kitchen amLODipine (NORVASC) 10 MG tablet TAKE 1 TABLET BY MOUTH ONCE DAILY.  . halobetasol (ULTRAVATE) 0.05 % cream APPLY TO AFFECTED AREA TWICE DAILY.  . hydrochlorothiazide (HYDRODIURIL) 25 MG tablet Take 1 tablet (25 mg total) by mouth daily.  Marland Kitchen HYDROcodone-acetaminophen (NORCO) 7.5-325 MG tablet Take 1 tablet by mouth every 8 (eight) hours as needed for moderate pain.  Marland Kitchen ibuprofen (ADVIL,MOTRIN) 200 MG tablet Take 2 tablets (400 mg total) by mouth every 6 (six) hours as needed for mild pain. Reported on 10/20/2015  . metFORMIN (GLUCOPHAGE XR) 500 MG 24 hr tablet Take 1 tablet (500 mg total) by mouth daily with breakfast.  . zolpidem (AMBIEN) 10 MG tablet TAKE ONE TABLET BY MOUTH AT BEDTIME AS NEEDED FOR  SLEEP.  . [DISCONTINUED] HYDROcodone-acetaminophen (NORCO) 7.5-325 MG tablet Take 1 tablet by mouth every 8 (eight) hours as needed for moderate pain.  . [DISCONTINUED] ibuprofen (ADVIL,MOTRIN) 200 MG tablet Take 2 tablets (400 mg total) by mouth every 6 (six) hours as needed for mild pain. Reported on 10/20/2015      Physical Exam Physical Exam BP (!) 143/90   Pulse 95   Ht 5' 4.5" (1.638 m)   Wt 226 lb (102.5 kg)   LMP 04/15/2013   BMI 38.19 kg/m   Gen. appearance Normal grooming hygiene The  patient is alert and oriented person place and time Mood is normal affect is normal Ambulatory status Cam Walker slight limp  Exam of the right foot  Inspection swelling and pes planus which is flexible mild tenderness over the medial and plantar aspect of the foot as well as the lateral subtalar and subfibular area ROM ankle range of motion 25 total Stability drawer test negative Strength weakness of the posterior tibial tendon grade 4  Skin: Normal skin without rash  Pulses: Normal dorsalis pedis pulse  Neuro: Normal sensation  Tenderness medial compartment left knee no joint effusion ligament stable    Data Reviewed   Assessment    Encounter Diagnoses  Name Primary?  Marland Kitchen PTTD (posterior tibial tendon dysfunction) Yes  . S/P arthroscopic knee surgery   . Left knee pain   . Primary osteoarthritis of left knee        Plan    Injection left knee Procedure note left knee injection verbal consent was obtained to inject left knee joint  Timeout was completed to confirm the site of injection  The medications used were 40 mg of Depo-Medrol and 1% lidocaine 3 cc  Anesthesia was provided by ethyl chloride and the skin was prepped with alcohol.  After cleaning the skin with alcohol a 20-gauge needle was used to inject the left knee joint. There were no complications. A sterile bandage was applied.  MRI right foot/ankle for PTT D staging for surgery       Arther Abbott 04/03/2016, 9:35 AM

## 2016-04-03 NOTE — Patient Instructions (Addendum)
Continue Cam Walker and medications as ordered  We will order MRI of the foot. Please wait for [facility] to approve the MRI we will call you with the appointment.  You have received an injection of steroids into the joint. 15% of patients will have increased pain within the 24 hours postinjection.   This is transient and will go away.   We recommend that you use ice packs on the injection site for 20 minutes every 2 hours and extra strength Tylenol 2 tablets every 8 as needed until the pain resolves.  If you continue to have pain after taking the Tylenol and using the ice please call the office for further instructions.

## 2016-04-04 ENCOUNTER — Telehealth: Payer: Self-pay | Admitting: *Deleted

## 2016-04-04 ENCOUNTER — Ambulatory Visit (HOSPITAL_COMMUNITY): Payer: BLUE CROSS/BLUE SHIELD | Admitting: Physical Therapy

## 2016-04-04 DIAGNOSIS — M25571 Pain in right ankle and joints of right foot: Secondary | ICD-10-CM | POA: Diagnosis not present

## 2016-04-04 DIAGNOSIS — M25671 Stiffness of right ankle, not elsewhere classified: Secondary | ICD-10-CM

## 2016-04-04 DIAGNOSIS — R2689 Other abnormalities of gait and mobility: Secondary | ICD-10-CM

## 2016-04-04 DIAGNOSIS — R2681 Unsteadiness on feet: Secondary | ICD-10-CM

## 2016-04-04 DIAGNOSIS — M6281 Muscle weakness (generalized): Secondary | ICD-10-CM

## 2016-04-04 NOTE — Addendum Note (Signed)
Addended by: Baldomero Lamy B on: 04/04/2016 02:03 PM   Modules accepted: Orders

## 2016-04-04 NOTE — Telephone Encounter (Signed)
LEFT MESSAGE FOR PATIENT TO RETURN CALL TO GIVE MRI APPT 04/07/16 AT 1:00PM AND FOLLOW UP APPT 04/10/16 AT 11:40 AM.

## 2016-04-04 NOTE — Telephone Encounter (Signed)
Patient aware of MRI and follow up dates and times.

## 2016-04-04 NOTE — Therapy (Signed)
[place], [place], [address] Phone: [phone]   Fax:  [phone]  Physical Therapy Treatment/Reassessment   Patient Details  Name: Robin Sherman MRN: [id number] Date of Birth: 04-24-1961 Referring Provider: Arther Abbott  Encounter Date: 04/04/2016      PT End of Session - 04/04/16 1218    Visit Number 8   Number of Visits 12   Date for PT Re-Evaluation 04/21/16   Authorization Type BCBS   Authorization Time Period 03/08/16 to 04/21/16   Authorization - Visit Number 8   Authorization - Number of Visits 12   PT Start Time 7902   PT Stop Time 0859   PT Time Calculation (min) 42 min   Activity Tolerance Patient tolerated treatment well   Behavior During Therapy [facility] for tasks assessed/performed      Past Medical History:  Diagnosis Date  . Asthma   . Eczema    Followed by Dr. Nevada Crane dermatology  . Hypertension     Past Surgical History:  Procedure Laterality Date  . ABDOMINAL HYSTERECTOMY    . BILATERAL SALPINGECTOMY Bilateral 08/01/2013   Procedure: BILATERAL SALPINGECTOMY;  Surgeon: Jonnie Kind, MD;  Location: [facility];  Service: Gynecology;  Laterality: Bilateral;  . CHONDROPLASTY Left 01/14/2014   Procedure: CHONDROPLASTY OF FEMUR;  Surgeon: Carole Civil, MD;  Location: [facility];  Service: Orthopedics;  Laterality: Left;  . COLONOSCOPY N/A 01/27/2013   Procedure: COLONOSCOPY;  Surgeon: Danie Binder, MD;  Location: [facility];  Service: Endoscopy;  Laterality: N/A;  8:30 AM  . HEMATOMA EVACUATION N/A 08/03/2013   Procedure: EVACUATION PELVIC HEMATOMA;  Surgeon: Jonnie Kind, MD;  Location: [facility];  Service: Gynecology;  Laterality: N/A;  . KNEE ARTHROSCOPY WITH MEDIAL MENISECTOMY Left 01/14/2014   Procedure: KNEE ARTHROSCOPY WITH PARTIAL MEDIAL MENISECTOMY;  Surgeon: Carole Civil, MD;  Location: [facility];  Service: Orthopedics;  Laterality: Left;  . SUPRACERVICAL ABDOMINAL HYSTERECTOMY  N/A 08/01/2013   Procedure: HYSTERECTOMY SUPRACERVICAL ABDOMINAL;  Surgeon: Jonnie Kind, MD;  Location: [facility];  Service: Gynecology;  Laterality: N/A;  . TUBAL LIGATION     [place]    There were no vitals filed for this visit.      Subjective Assessment - 04/04/16 0819    Subjective Pt reports that she worked last night and her foot is in pain today along the medial aspect of her foot. She has been wearing her boot some at work, but will take her shoe to switch back when it is feeling good. In general the boot increases her pain when on. She feels she has good and bad days. One of her exercises irritates her foot, so she has not been doing that one.    Pertinent History Asthma, HTN   Limitations Walking   How long can you sit comfortably? unlimited   How long can you stand comfortably? 1 hour    How long can you walk comfortably? 2 hours    Diagnostic tests Xray: negative for fracture, (+) bone spur    Patient Stated Goals improve pain and mobility   Currently in Pain? Yes   Pain Score 8    Pain Location Ankle   Pain Orientation Right;Medial   Pain Descriptors / Indicators Sharp;Other (Comment)  pins and needles    Pain Type Chronic pain   Pain Radiating Towards none    Pain Onset More than a month ago   Pain Frequency Intermittent  Aggravating Factors  walking, standing, shoes    Pain Relieving Factors getting off of it, ice    Effect of Pain on Daily Activities limited activity tolerance    Multiple Pain Sites No            [facility] PT Assessment - 04/04/16 0001      Assessment   Medical Diagnosis Rt ankle pain   Referring Provider Arther Abbott   Next MD Visit 04/03/16  approximate    Prior Therapy none      Precautions   Precautions None   Required Braces or Orthoses Other Brace/Splint   Other Brace/Splint instructed to wear boot for 4 more weeks while on her feet (6 total)     Restrictions   Weight Bearing Restrictions No     Balance Screen    Has the patient fallen in the past 6 months No   Has the patient had a decrease in activity level because of a fear of falling?  No   Is the patient reluctant to leave their home because of a fear of falling?  No     Home Ecologist residence     Prior Function   Level of Independence Independent     Cognition   Overall Cognitive Status Within Functional Limits for tasks assessed     Observation/Other Assessments   Observations --     Sensation   Light Touch Appears Intact     Posture/Postural Control   Posture Comments standing: hip adduction, B foot pronation (Rt>Lt)     AROM   Right Ankle Dorsiflexion 15   Right Ankle Inversion 20   Right Ankle Eversion 8   Left Ankle Dorsiflexion 12   Left Ankle Inversion 20   Left Ankle Eversion 8     Strength   Right Hip Extension 4-/5   Right Hip ABduction 4/5   Left Hip Extension 4-/5   Left Hip ABduction 4/5   Right Ankle Dorsiflexion 5/5   Right Ankle Plantar Flexion 4-/5  15   Right Ankle Inversion 5/5   Left Ankle Dorsiflexion 5/5   Left Ankle Plantar Flexion 4-/5  15   Left Ankle Inversion 5/5     Palpation   Palpation comment TTP along Rt distal posterior tibial tendon and plantar aspect of calcaneus along plantar fascia     Special Tests    Special Tests Ankle/Foot Special Tests   Ankle/Foot Special Tests  Provocative Tinel's Test     Provocative Tinel's test    Findings Negative   Comments BLE     Ambulation/Gait   Gait Comments (+) foot pronation Rt>Lt, decreased push off noted BLE     High Level Balance   High Level Balance Comments SLS: Rt: 10 sec, Lt: 5 sec                      [facility] Adult PT Treatment/Exercise - 04/04/16 0001      Ankle Exercises: Standing   Heel Raises 10 reps  single leg    Other Standing Ankle Exercises bridge with green TB x15   Other Standing Ankle Exercises hip abduction against wall x10 each                 PT  Education - 04/04/16 1217    Education provided Yes   Education Details updated HEP; noted improvements in function/strength/activity tolerance; implications for orthotic to address excessive pronation and improve tolerance to work  Person(s) Educated Patient   Methods Explanation;Demonstration;Handout   Comprehension Verbalized understanding          PT Short Term Goals - 04/04/16 1219      PT SHORT TERM GOAL #1   Title Pt will demo consistency and independence with her HEP to improve strength and mobility.   Baseline technique still not accurate with several exercises    Time 2   Period Weeks   Status Partially Met     PT SHORT TERM GOAL #2   Title Pt will report no greater than a 6/10 max pain during activity.   Time 3   Period Weeks   Status New     PT SHORT TERM GOAL #3   Title Pt will demo improved ankle DF to atleast 10 degrees to improve mechanics during gait.    Time 3   Period Weeks   Status Achieved           PT Long Term Goals - 04/04/16 1220      PT LONG TERM GOAL #1   Title Pt will demo improved BLE strength of 5/5 MMT (except ankle PF) to increase safety with mobility at work.    Time 6   Period Weeks   Status Partially Met     PT LONG TERM GOAL #2   Title Pt will report improved activity tolerance evident by her ability to ambulate for up to 2 hours at work without more than 3/10 pain.    Baseline pt reports being able to walk for atleast 2 hours before needing to get off her feet    Time 6   Period Weeks   Status Achieved     PT LONG TERM GOAL #3   Title Pt will demo improved balance evident by her ability to maintain SLS on each LE for up to 15 sec, 3/5 trials.    Time 6   Period Weeks   Status Not Met     PT LONG TERM GOAL #4   Title Pt will demo improved gastroc strength up to atleast 4/5 MMT, in order to assist during gait.   Baseline improved by ~5 heel raises on each LE   Time 6   Period Weeks   Status Partially Met                Plan - 04/04/16 1218    Clinical Impression Statement Pt was reassessed this visit having met a couple of her short and long term goals. She demonstrates improved ankle ROM and strength as well as improved activity tolerance reporting she is now able to stand up to 1 hour without needing to take a rest break, compared to 30 minutes at initial eval. Up until recently, she had been reporting resolution of pain along her medial ankle, with most of her symptoms along the plantar aspect of her calcaneus. Today, however she presents with increased pain along both locations on her Rt ankle. She continues to demonstrate foot pronation as well as limitations in hip and plantarflexor strength and single leg balance. She is somewhat independent with her HEP and requires regular correction of technique with therex she is supposed to be performing at home. HEP was adjusted today to ensure therex is capable of being performed correctly and pt demonstration proper technique. Pt could also benefit from a foot orthotic to address her pronation and possibly improve tolerance to long hours on her feet during work. Will continue with current POC.   Rehab  Potential Good   Clinical Impairments Affecting Rehab Potential (-) working 12 hour days on her feet    PT Frequency 2x / week   PT Duration 6 weeks   PT Treatment/Interventions ADLs/Self Care Home Management;Cryotherapy;Therapeutic exercise;Therapeutic activities;Functional mobility training;Stair training;Gait training;Balance training;Manual techniques;Neuromuscular re-education;Patient/family education;Orthotic Fit/Training;Taping;Passive range of motion   PT Next Visit Plan manual to address limited ankle/1st MTP ext; progression of ankle strength in closed chain positions as tolerated; seated BAPS board    PT Home Exercise Plan HEP: ankle 4 way blue TB, bridge with green TB, hip abduction against wall   Recommended Other Services orthotic    Consulted and Agree with Plan of Care Patient      Patient will benefit from skilled therapeutic intervention in order to improve the following deficits and impairments:  Abnormal gait, Decreased activity tolerance, Decreased balance, Decreased strength, Difficulty walking, Pain, Impaired flexibility, Increased edema, Decreased range of motion, Decreased mobility, Postural dysfunction  Visit Diagnosis: Pain in right ankle and joints of right foot  Stiffness of right ankle, not elsewhere classified  Other abnormalities of gait and mobility  Muscle weakness (generalized)  Unsteadiness on feet     Problem List Patient Active Problem List   Diagnosis Date Noted  . Borderline diabetes 07/02/2015  . Vitamin D deficiency 07/02/2015  . Lumbar back pain 07/02/2015  . Acute meniscal tear of left knee 12/23/2013  . Bursitis of left knee 11/03/2013  . Insomnia 10/29/2013  . Sciatica of left side 10/13/2013  . Arthritis of knee, degenerative 10/13/2013  . Left knee pain 10/13/2013  . Pain, joint, multiple sites 04/15/2013  . Arthritis 12/02/2012  . Headache(784.0) 09/25/2012  . Hand dermatitis 03/02/2012  . Essential hypertension, benign 09/21/2011  . Obesity 09/21/2011  . Shoulder pain, right 09/21/2011  . Asthma, intermittent 09/21/2011  . Tobacco user 09/21/2011  . TRIGGER FINGER 11/03/2008    12:36 PM,04/04/16 Elly Modena PT, DPT [facility] [place] [address] [place], [place], [address] Phone: [phone]   Fax:  [phone]  Name: Robin Sherman MRN: [id number] Date of Birth: Nov 19, 1960

## 2016-04-06 ENCOUNTER — Telehealth (HOSPITAL_COMMUNITY): Payer: Self-pay | Admitting: Physical Therapy

## 2016-04-06 ENCOUNTER — Ambulatory Visit (HOSPITAL_COMMUNITY): Payer: BLUE CROSS/BLUE SHIELD | Admitting: Physical Therapy

## 2016-04-06 NOTE — Telephone Encounter (Signed)
Pt did not show for appointment.  Left message regarding missed visit and reminder of next one Tuesday at [place], PTA/CLT [phone]

## 2016-04-07 ENCOUNTER — Ambulatory Visit (HOSPITAL_COMMUNITY)
Admission: RE | Admit: 2016-04-07 | Discharge: 2016-04-07 | Disposition: A | Discharge status: Home / Self Care | Payer: BLUE CROSS/BLUE SHIELD | Source: Ambulatory Visit | Attending: Orthopedic Surgery | Admitting: Orthopedic Surgery

## 2016-04-07 DIAGNOSIS — M76829 Posterior tibial tendinitis, unspecified leg: Secondary | ICD-10-CM

## 2016-04-07 DIAGNOSIS — R937 Abnormal findings on diagnostic imaging of other parts of musculoskeletal system: Secondary | ICD-10-CM | POA: Insufficient documentation

## 2016-04-07 DIAGNOSIS — M6789 Other specified disorders of synovium and tendon, multiple sites: Secondary | ICD-10-CM | POA: Diagnosis present

## 2016-04-10 ENCOUNTER — Encounter: Payer: Self-pay | Admitting: Orthopedic Surgery

## 2016-04-10 ENCOUNTER — Ambulatory Visit (INDEPENDENT_AMBULATORY_CARE_PROVIDER_SITE_OTHER): Payer: BLUE CROSS/BLUE SHIELD | Admitting: Orthopedic Surgery

## 2016-04-10 VITALS — BP 131/85 | HR 95 | Ht 64.5 in | Wt 225.0 lb

## 2016-04-10 DIAGNOSIS — M899 Disorder of bone, unspecified: Secondary | ICD-10-CM

## 2016-04-10 DIAGNOSIS — M722 Plantar fascial fibromatosis: Secondary | ICD-10-CM

## 2016-04-10 DIAGNOSIS — M949 Disorder of cartilage, unspecified: Secondary | ICD-10-CM

## 2016-04-10 DIAGNOSIS — M76829 Posterior tibial tendinitis, unspecified leg: Secondary | ICD-10-CM

## 2016-04-10 DIAGNOSIS — M6789 Other specified disorders of synovium and tendon, multiple sites: Secondary | ICD-10-CM

## 2016-04-10 MED ORDER — HYDROCODONE-ACETAMINOPHEN 7.5-325 MG PO TABS: 1.0000 | ORAL_TABLET | Freq: Three times a day (TID) | ORAL | 0 refills | Status: DC | PRN

## 2016-04-10 MED ORDER — PREDNISONE 10 MG PO TABS: 10.0000 mg | ORAL_TABLET | Freq: Every day | ORAL | 1 refills | Status: DC

## 2016-04-10 NOTE — Patient Instructions (Signed)
CALL [employer] [phone] TO SCHEDULE APPT FOR AFO TO BE MADE   START 10 MG PREDNISONE DAILY

## 2016-04-10 NOTE — Progress Notes (Signed)
Patient ID: Robin Sherman, female   DOB: [birth year], 55 y.o.   MRN: [id number]  Chief Complaint  Patient presents with  . Follow-up    MRI RT ANKLE RESULTS    HPI Robin Sherman is a 55 y.o. female.  Follow-up after MRI patient complains of medial ankle pain HPI  No trauma treated for flatfoot deformity. I reviewed her symptoms with her. She has plantar pain, medial ankle pain lateral ankle pain  Her MRI shows a medial talar dome lesion she symptomatic there she has plantar fascial partial tear for plantar fasciitis symptomatic there also pain in the medial side of the ankle consistent with the MRI findings of fluid in the posterior tibial tendon sheath and the lateral pain is from the nutcracker effect.    Review of Systems Review of Systems  No fever. She does have a slight gait disturbance      Physical Exam BP 131/85   Pulse 95   Ht 5' 4.5" (1.638 m)   Wt 225 lb (102.1 kg)   LMP 04/15/2013   BMI 38.02 kg/m     MRI review I agree with the report there is a medial talar dome lesion is small is contained. There is peroneal tendon sheath fluid. There is swelling in the lateral subtalar area. There is a plantar fascial partial tear.  Recommend UCBL secondary to the lateral foot pain associated with the adult flatfoot  Refill Norco.   Meds ordered this encounter  Medications  . predniSONE (DELTASONE) 10 MG tablet    Sig: Take 1 tablet (10 mg total) by mouth daily.    Dispense:  42 tablet    Refill:  1  . HYDROcodone-acetaminophen (NORCO) 7.5-325 MG tablet    Sig: Take 1 tablet by mouth every 8 (eight) hours as needed for moderate pain.    Dispense:  42 tablet    Refill:  0

## 2016-04-11 ENCOUNTER — Ambulatory Visit (HOSPITAL_COMMUNITY): Payer: BLUE CROSS/BLUE SHIELD | Admitting: Physical Therapy

## 2016-04-13 ENCOUNTER — Ambulatory Visit (HOSPITAL_COMMUNITY): Payer: BLUE CROSS/BLUE SHIELD

## 2016-04-13 DIAGNOSIS — M25571 Pain in right ankle and joints of right foot: Secondary | ICD-10-CM

## 2016-04-13 DIAGNOSIS — R2689 Other abnormalities of gait and mobility: Secondary | ICD-10-CM

## 2016-04-13 DIAGNOSIS — M6281 Muscle weakness (generalized): Secondary | ICD-10-CM

## 2016-04-13 DIAGNOSIS — M25671 Stiffness of right ankle, not elsewhere classified: Secondary | ICD-10-CM

## 2016-04-13 DIAGNOSIS — R2681 Unsteadiness on feet: Secondary | ICD-10-CM

## 2016-04-13 NOTE — Patient Instructions (Signed)
  Plantar Fascia Frozen Bottle Roll  Plantar Fascia Roll  Use a frozen water bottle (plastic, no glass).  In sitting or standing roll the bottom of your foot with moderate pressure. Use as much pressure as you can tolerate without discomfort.    x 5-10 minutes at a time.    Plantar Fascia Stretch  In standing, place your big toe on a short step or book as shown in the picture. Slowly lean forward until a stretch is felt under the bottom of the foot and hold.   3 x 30 seconds, 3x's per day.

## 2016-04-13 NOTE — Therapy (Signed)
[place], [place], [address] Phone: [phone]   Fax:  [phone]  Physical Therapy Treatment  Patient Details  Name: Robin Sherman MRN: [id number] Date of Birth: [birth year] Referring Provider: Arther Abbott  Encounter Date: 04/13/2016      PT End of Session - 04/13/16 0957    Visit Number 9   Number of Visits 12   Date for PT Re-Evaluation 04/21/16   Authorization Type BCBS   Authorization Time Period 03/08/16 to 04/21/16   Authorization - Visit Number 9   Authorization - Number of Visits 12   PT Start Time 0905   PT Stop Time 0955   PT Time Calculation (min) 50 min   Activity Tolerance Patient tolerated treatment well   Behavior During Therapy [facility] for tasks assessed/performed      Past Medical History:  Diagnosis Date  . Asthma   . Eczema    Followed by Dr. [place] Crane dermatology  . Hypertension     Past Surgical History:  Procedure Laterality Date  . ABDOMINAL HYSTERECTOMY    . BILATERAL SALPINGECTOMY Bilateral 08/01/2013   Procedure: BILATERAL SALPINGECTOMY;  Surgeon: Jonnie Kind, MD;  Location: [facility];  Service: Gynecology;  Laterality: Bilateral;  . CHONDROPLASTY Left 01/14/2014   Procedure: CHONDROPLASTY OF FEMUR;  Surgeon: Carole Civil, MD;  Location: [facility];  Service: Orthopedics;  Laterality: Left;  . COLONOSCOPY N/A 01/27/2013   Procedure: COLONOSCOPY;  Surgeon: Danie Binder, MD;  Location: [facility];  Service: Endoscopy;  Laterality: N/A;  8:30 AM  . HEMATOMA EVACUATION N/A 08/03/2013   Procedure: EVACUATION PELVIC HEMATOMA;  Surgeon: Jonnie Kind, MD;  Location: [facility];  Service: Gynecology;  Laterality: N/A;  . KNEE ARTHROSCOPY WITH MEDIAL MENISECTOMY Left 01/14/2014   Procedure: KNEE ARTHROSCOPY WITH PARTIAL MEDIAL MENISECTOMY;  Surgeon: Carole Civil, MD;  Location: [facility];  Service: Orthopedics;  Laterality: Left;  . SUPRACERVICAL ABDOMINAL HYSTERECTOMY N/A 08/01/2013    Procedure: HYSTERECTOMY SUPRACERVICAL ABDOMINAL;  Surgeon: Jonnie Kind, MD;  Location: [facility];  Service: Gynecology;  Laterality: N/A;  . TUBAL LIGATION     [place]    There were no vitals filed for this visit.      Subjective Assessment - 04/13/16 0910    Subjective Dr. Aline Brochure has placed her on a steroid, no falls or close calls.  Pt states that she has had her grandkids for the past 2 days, therefore, has not been doing her exercises.  MRI done 9/22 (see diagnostic test results)   Pertinent History Asthma, HTN   Limitations Walking   How long can you sit comfortably? unlimited   How long can you stand comfortably? 1 hour    How long can you walk comfortably? 2 hours    Diagnostic tests Xray: negative for fracture, (+) bone spur, MRI 9/22 of the R foot: Thickening of the medial band of the plantar fascia with a small tear at the calcaneal insertion consistent with plantar fasciitis.  Ankle Joint: No joint effusion. 4.3 mm  osteochondral lesion involving the medial corner of the talar dome with overlying cartilage loss and subchondral reactive marrow changes.   Patient Stated Goals improve pain and mobility   Currently in Pain? Yes   Pain Score 7    Pain Location Foot  plantar aspect of foot along the proximal portin of the heel   Pain Descriptors / Indicators Sharp   Pain Type Chronic pain  Pain Radiating Towards none   Pain Onset More than a month ago   Pain Frequency Intermittent   Aggravating Factors  walking, standing, shoes   Pain Relieving Factors getting off of it, ice   Effect of Pain on Daily Activities limited activity tolerance   Multiple Pain Sites No            [facility] PT Assessment - 04/13/16 0001      Assessment   Medical Diagnosis Rt ankle pain   Referring Provider Arther Abbott   Next MD Visit 04/03/16  approximate   Prior Therapy none                      [facility] Adult PT Treatment/Exercise - 04/13/16 0001      Manual  Therapy   Manual Therapy Soft tissue mobilization;Joint mobilization   Manual therapy comments separate from all other interventions    Joint Mobilization Rt MTP ext grade III-IV jt mobs    Soft tissue mobilization STM to plantar fascia, and ice massage to plantar fascia and heel     Ankle Exercises: Seated   BAPS Sitting  AP taps x 10, and clockwise circle x 5   Other Seated Ankle Exercises --   Other Seated Ankle Exercises Self plantar fascia STM with rolling rod x 2 min     Ankle Exercises: Stretches   Soleus Stretch 3 reps;30 seconds  standing at the wall.    Gastroc Stretch 3 reps;30 seconds  standing at the wall   Other Stretch Supine B HS stretch with sheet around dorsiflexed foot. 3 x 30 sec.    Other Stretch Toe extension at step 3 x 30 seconds B feet.                  PT Education - 04/13/16 0956    Education provided Yes   Education Details updated HEP.  Pt has an appointment on Monday for orthotic fitting in [place] - unsure of the name of the company   Person(s) Educated Patient   Methods Explanation;Demonstration;Handout   Comprehension Verbalized understanding;Returned demonstration;Need further instruction          PT Short Term Goals - 04/13/16 1219      PT SHORT TERM GOAL #1   Title Pt will demo consistency and independence with her HEP to improve strength and mobility.   Baseline technique still not accurate with several exercises    Time 2   Period Weeks   Status Partially Met     PT SHORT TERM GOAL #2   Title Pt will report no greater than a 6/10 max pain during activity.   Time 3   Period Weeks   Status New     PT SHORT TERM GOAL #3   Title Pt will demo improved ankle DF to atleast 10 degrees to improve mechanics during gait.    Time 3   Period Weeks   Status Achieved           PT Long Term Goals - 04/13/16 1220      PT LONG TERM GOAL #1   Title Pt will demo improved BLE strength of 5/5 MMT (except ankle PF) to increase  safety with mobility at work.    Time 6   Period Weeks   Status Partially Met     PT LONG TERM GOAL #2   Title Pt will report improved activity tolerance evident by her ability to ambulate for up to 2  hours at work without more than 3/10 pain.    Baseline pt reports being able to walk for atleast 2 hours before needing to get off her feet    Time 6   Period Weeks   Status Achieved     PT LONG TERM GOAL #3   Title Pt will demo improved balance evident by her ability to maintain SLS on each LE for up to 15 sec, 3/5 trials.    Time 6   Period Weeks   Status Not Met     PT LONG TERM GOAL #4   Title Pt will demo improved gastroc strength up to atleast 4/5 MMT, in order to assist during gait.   Baseline improved by ~5 heel raises on each LE   Time 6   Period Weeks   Status Partially Met               Plan - 04/13/16 1216    Clinical Impression Statement Pt presents with continued R foot pain.  MRI done 9/22 (see diagnostic results section).  Pt expressed that she hasn't been able to keep up with her HEP for the past few days due to keeping her grandchildren.  Pt continues to demonstrate poor ankle stability, and flexibility.  Adjusted posture with gastroc/soleus stretching.  Encouraged pt to wear brace when sleeping at night to prevent prolonged plantar flexed position.  Pt states she has an appointment on Monday to have an orthotic made.  Will continue to encourage diligence with HEP.    Rehab Potential Good   Clinical Impairments Affecting Rehab Potential (-) working 12 hour days on her feet    PT Frequency 2x / week   PT Duration 6 weeks   PT Treatment/Interventions ADLs/Self Care Home Management;Cryotherapy;Therapeutic exercise;Therapeutic activities;Functional mobility training;Stair training;Gait training;Balance training;Manual techniques;Neuromuscular re-education;Patient/family education;Orthotic Fit/Training;Taping;Passive range of motion   PT Next Visit Plan manual to  address limited ankle/1st MTP ext; progression of ankle strength in closed chain positions as tolerated; seated BAPS board    PT Home Exercise Plan HEP: ankle 4 way blue TB, bridge with green TB, hip abduction against wall   Recommended Other Services orthotic - has an appointment on Monday to have one made at a place in [place].    Consulted and Agree with Plan of Care Patient      Patient will benefit from skilled therapeutic intervention in order to improve the following deficits and impairments:  Abnormal gait, Decreased activity tolerance, Decreased balance, Decreased strength, Difficulty walking, Pain, Impaired flexibility, Increased edema, Decreased range of motion, Decreased mobility, Postural dysfunction  Visit Diagnosis: Pain in right ankle and joints of right foot  Stiffness of right ankle, not elsewhere classified  Other abnormalities of gait and mobility  Muscle weakness (generalized)  Unsteadiness on feet     Problem List Patient Active Problem List   Diagnosis Date Noted  . Borderline diabetes 07/02/2015  . Vitamin D deficiency 07/02/2015  . Lumbar back pain 07/02/2015  . Acute meniscal tear of left knee 12/23/2013  . Bursitis of left knee 11/03/2013  . Insomnia 10/29/2013  . Sciatica of left side 10/13/2013  . Arthritis of knee, degenerative 10/13/2013  . Left knee pain 10/13/2013  . Pain, joint, multiple sites 04/15/2013  . Arthritis 12/02/2012  . Headache(784.0) 09/25/2012  . Hand dermatitis 03/02/2012  . Essential hypertension, benign 09/21/2011  . Obesity 09/21/2011  . Shoulder pain, right 09/21/2011  . Asthma, intermittent 09/21/2011  . Tobacco user 09/21/2011  . TRIGGER FINGER  11/03/2008    Beth Ferol Laiche, PT, DPT X: [place] [address] [place], [place], [address] Phone: [phone]   Fax:  [phone]  Name: Clementine Soulliere MRN: [id number] Date of Birth: 21-Mar-1961

## 2016-04-18 ENCOUNTER — Ambulatory Visit (HOSPITAL_COMMUNITY): Payer: BLUE CROSS/BLUE SHIELD | Attending: Orthopedic Surgery | Admitting: Physical Therapy

## 2016-04-18 ENCOUNTER — Telehealth (HOSPITAL_COMMUNITY): Payer: Self-pay | Admitting: Physical Therapy

## 2016-04-18 DIAGNOSIS — R2681 Unsteadiness on feet: Secondary | ICD-10-CM | POA: Insufficient documentation

## 2016-04-18 DIAGNOSIS — M6281 Muscle weakness (generalized): Secondary | ICD-10-CM | POA: Insufficient documentation

## 2016-04-18 DIAGNOSIS — M25671 Stiffness of right ankle, not elsewhere classified: Secondary | ICD-10-CM | POA: Insufficient documentation

## 2016-04-18 DIAGNOSIS — M25571 Pain in right ankle and joints of right foot: Secondary | ICD-10-CM | POA: Insufficient documentation

## 2016-04-18 DIAGNOSIS — R2689 Other abnormalities of gait and mobility: Secondary | ICD-10-CM | POA: Insufficient documentation

## 2016-04-18 NOTE — Telephone Encounter (Signed)
No Show: Pt states she forgot about appt. Reminded of next appt 04/20/16 at 8:15am and she confirmed this.   6:45 PM,04/18/16 [place] [phone]

## 2016-04-19 ENCOUNTER — Other Ambulatory Visit: Payer: Self-pay | Admitting: Family Medicine

## 2016-04-20 ENCOUNTER — Ambulatory Visit (HOSPITAL_COMMUNITY): Payer: BLUE CROSS/BLUE SHIELD | Admitting: Physical Therapy

## 2016-04-20 DIAGNOSIS — M6281 Muscle weakness (generalized): Secondary | ICD-10-CM | POA: Diagnosis present

## 2016-04-20 DIAGNOSIS — R2681 Unsteadiness on feet: Secondary | ICD-10-CM | POA: Diagnosis present

## 2016-04-20 DIAGNOSIS — M25671 Stiffness of right ankle, not elsewhere classified: Secondary | ICD-10-CM

## 2016-04-20 DIAGNOSIS — R2689 Other abnormalities of gait and mobility: Secondary | ICD-10-CM | POA: Diagnosis present

## 2016-04-20 DIAGNOSIS — M25571 Pain in right ankle and joints of right foot: Secondary | ICD-10-CM | POA: Diagnosis present

## 2016-04-20 NOTE — Patient Instructions (Signed)
   ANKLE ABC's   While in a seated position, write out the alphabet in the air with your big toe.  Your ankle should be moving as you perform this.  Repeat 2 sets each foot, once a day.    Ankle circles  Rotate ankle in a clockwise and counter clockwise direction.   Make sure you are going through the full range of motion- take your time and do not speed through this exercise.   Repeat 10-15 times, once a day.     Seated Heel Raises  Start: Seated in chair, with upright posture and feet flat on floor.  Movement: Raise both heels off the floor as high as possible, keeping toes in contact with floor.  End: Lower heels back to floor. Repeat 10 times, once a day.

## 2016-04-20 NOTE — Therapy (Signed)
[facility] [address] [place], [place], [address] Phone: [phone]   Fax:  [phone]  Physical Therapy Treatment (Re-Assessment)  Patient Details  Name: Robin Sherman MRN: [id number] Date of Birth: [birth year] Referring Provider: Arther Abbott   Encounter Date: 04/20/2016      PT End of Session - 04/20/16 0856    Visit Number 10   Number of Visits 19   Date for PT Re-Evaluation 05/11/16   Authorization Type BC[employer]   Authorization Time Period 7/98/92 to 05/25/40; recert done on 74/0   PT Start Time 0816   PT Stop Time 0855   PT Time Calculation (min) 39 min   Activity Tolerance Patient tolerated treatment well   Behavior During Therapy [facility] for tasks assessed/performed      Past Medical History:  Diagnosis Date  . Asthma   . Eczema    Followed by Dr. [place] Crane dermatology  . Hypertension     Past Surgical History:  Procedure Laterality Date  . ABDOMINAL HYSTERECTOMY    . BILATERAL SALPINGECTOMY Bilateral 08/01/2013   Procedure: BILATERAL SALPINGECTOMY;  Surgeon: Jonnie Kind, MD;  Location: [facility];  Service: Gynecology;  Laterality: Bilateral;  . CHONDROPLASTY Left 01/14/2014   Procedure: CHONDROPLASTY OF FEMUR;  Surgeon: Carole Civil, MD;  Location: [facility];  Service: Orthopedics;  Laterality: Left;  . COLONOSCOPY N/A 01/27/2013   Procedure: COLONOSCOPY;  Surgeon: Danie Binder, MD;  [facility];  Service: Endoscopy;  Laterality: N/A;  8:30 AM  . HEMATOMA EVACUATION N/A 08/03/2013   Procedure: EVACUATION PELVIC HEMATOMA;  Surgeon: Jonnie Kind, MD;  Location: [facility];  Service: Gynecology;  Laterality: N/A;  . KNEE ARTHROSCOPY WITH MEDIAL MENISECTOMY Left 01/14/2014   Procedure: KNEE ARTHROSCOPY WITH PARTIAL MEDIAL MENISECTOMY;  Surgeon: Carole Civil, MD;  Location: [facility];  Service: Orthopedics;  Laterality: Left;  . SUPRACERVICAL ABDOMINAL HYSTERECTOMY N/A 08/01/2013   Procedure: HYSTERECTOMY  SUPRACERVICAL ABDOMINAL;  Surgeon: Jonnie Kind, MD;  Location: [facility];  Service: Gynecology;  Laterality: N/A;  . TUBAL LIGATION     [place]    There were no vitals filed for this visit.      Subjective Assessment - 04/20/16 0818    Subjective Patient arrives today stating that she has no pain today, but she will still have pain at work. Dr. Aline Brochure has taken her out of the boot but she states she is still wearing the boot at work despite the MD saying she could come out of it. She is stilll getting an appointment to be measured for an orthotic. She is doing ok with ehr HEP, nothing else major going on.    Pertinent History Asthma, HTN   How long can you sit comfortably? 10/5- unlimited    How long can you stand comfortably? 10/5- 10 minutes    How long can you walk comfortably? 10/5- 30 minutes    Diagnostic tests Xray: negative for fracture, (+) bone spur, MRI 9/22 of the R foot: Thickening of the medial band of the plantar fascia with a small tear at the calcaneal insertion consistent with plantar fasciitis.  Ankle Joint: No joint effusion. 4.3 mm  osteochondral lesion involving the medial corner of the talar dome with overlying cartilage loss and subchondral reactive marrow changes.   Patient Stated Goals improve pain and mobility   Currently in Pain? No/denies            [facility] PT Assessment -  04/20/16 0001      Assessment   Medical Diagnosis Rt ankle pain   Referring Provider Arther Abbott    Next MD Visit Dr. Aline Brochure on 10/18   Prior Therapy none      Balance Screen   Has the patient fallen in the past 6 months No   Has the patient had a decrease in activity level because of a fear of falling?  Yes   Is the patient reluctant to leave their home because of a fear of falling?  No     Prior Function   Level of Independence Independent   Vocation Full time employment   Vocation Requirements CNA      AROM   Right Ankle Dorsiflexion 13   Right Ankle  Inversion 28   Right Ankle Eversion 10   Left Ankle Dorsiflexion 9   Left Ankle Inversion 20   Left Ankle Eversion 15     Strength   Right Hip Extension 4+/5   Right Hip ABduction 4+/5   Left Hip Extension 4/5   Left Hip ABduction 4+/5   Right Ankle Dorsiflexion 5/5   Right Ankle Inversion 5/5   Right Ankle Eversion 5/5   Left Ankle Dorsiflexion 5/5   Left Ankle Inversion 5/5     Palpation   Palpation comment remains tender to touch along bottom of foot, tibialis posterior, calcaneous      High Level Balance   High Level Balance Comments L LE 10 seconds, R 3 seconds and painful                      [facility] Adult PT Treatment/Exercise - 04/20/16 0001      Ankle Exercises: Seated   ABC's 2 reps  B    Heel Raises 10 reps;Other (comment)  2 sets, unweighted    Toe Raise 15 reps  2 sets      Ankle Exercises: Stretches   Gastroc Stretch 3 reps;30 seconds                PT Education - 04/20/16 0855    Education provided Yes   Education Details progress with skilled PT services, POC, HEP updates    Person(s) Educated Patient   Methods Explanation;Demonstration;Handout   Comprehension Verbalized understanding;Need further instruction          PT Short Term Goals - 04/20/16 2706      PT SHORT TERM GOAL #1   Title Pt will demo consistency and independence with her HEP to improve strength and mobility.   Baseline 10/5- once a day    Time 2   Period Weeks   Status Partially Met     PT SHORT TERM GOAL #2   Title Pt will report no greater than a 6/10 max pain during activity.   Baseline 10/5- pain remains a 10/10 sometimes, sometimes it is 0/10, sometimes it is just an ache   Status On-going     PT SHORT TERM GOAL #3   Title Pt will demo improved ankle DF to atleast 10 degrees to improve mechanics during gait.    Time 3   Period Weeks   Status Achieved           PT Long Term Goals - 04/20/16 2376      PT LONG TERM GOAL #1   Title Pt  will demo improved BLE strength of 5/5 MMT (except ankle PF) to increase safety with mobility at work.    Time  6   Period Weeks   Status Achieved     PT LONG TERM GOAL #2   Title Pt will report improved activity tolerance evident by her ability to ambulate for up to 2 hours at work without more than 3/10 pain.    Baseline 10/5- sometimes she still has to get her boot due to pain    Time 6   Period Weeks   Status Partially Met     PT LONG TERM GOAL #3   Title Pt will demo improved balance evident by her ability to maintain SLS on each LE for up to 15 sec, 3/5 trials.    Baseline 10/5- 10 seconds L, 3 seconds R    Time 6   Period Weeks   Status On-going     PT LONG TERM GOAL #4   Title Pt will demo improved gastroc strength up to atleast 4/5 MMT, in order to assist during gait.   Baseline 10/5- ongoing gastroc weakness B   Time 6   Period Weeks   Status On-going               Plan - 04/20/16 0856    Clinical Impression Statement Re-assessment performed today. Patient reportst that in general she is feeling better but, due to some confusion with her appointments, has not been attending PT recently and appears to have a bit of a setback objectively; she states and measures show that she was doing quite well when consistently attending skilled PT services. She does continue to demonstrate ankle stiffness and pain especially on the R, significant functional weakness primarily with bilateral gastroc/soleus, gait impairment, unsteadiness, and continues to experience ongoing pain at work that requires her to get back into the boot from time to time. At this point recommend extension of skilled PT services in order to continue addressing functional limitations and to assist in reaching optimal level of function with minimal level of pain.    Rehab Potential Good   Clinical Impairments Affecting Rehab Potential (-) working 12 hour days on her feet    PT Frequency 2x / week   PT Duration  3 weeks   PT Treatment/Interventions ADLs/Self Care Home Management;Cryotherapy;Therapeutic exercise;Therapeutic activities;Functional mobility training;Stair training;Gait training;Balance training;Manual techniques;Neuromuscular re-education;Patient/family education;Orthotic Fit/Training;Taping;Passive range of motion   PT Next Visit Plan address ankle stiffness; ankle strengthening and balance. Gastroc work in sitting and then progress to CKC due to pain    PT Home Exercise Plan HEP: ankle 4 way blue TB, bridge with green TB, hip abduction against wall; ankle circles, ankle alphabet, seated heel raises    Consulted and Agree with Plan of Care Patient      Patient will benefit from skilled therapeutic intervention in order to improve the following deficits and impairments:  Abnormal gait, Decreased activity tolerance, Decreased balance, Decreased strength, Difficulty walking, Pain, Impaired flexibility, Increased edema, Decreased range of motion, Decreased mobility, Postural dysfunction  Visit Diagnosis: Pain in right ankle and joints of right foot - Plan: PT plan of care cert/re-cert  Stiffness of right ankle, not elsewhere classified - Plan: PT plan of care cert/re-cert  Other abnormalities of gait and mobility - Plan: PT plan of care cert/re-cert  Muscle weakness (generalized) - Plan: PT plan of care cert/re-cert  Unsteadiness on feet - Plan: PT plan of care cert/re-cert     Problem List Patient Active Problem List   Diagnosis Date Noted  . Borderline diabetes 07/02/2015  . Vitamin D deficiency 07/02/2015  . Lumbar  back pain 07/02/2015  . Acute meniscal tear of left knee 12/23/2013  . Bursitis of left knee 11/03/2013  . Insomnia 10/29/2013  . Sciatica of left side 10/13/2013  . Arthritis of knee, degenerative 10/13/2013  . Left knee pain 10/13/2013  . Pain, joint, multiple sites 04/15/2013  . Arthritis 12/02/2012  . Headache(784.0) 09/25/2012  . Hand dermatitis 03/02/2012   . Essential hypertension, benign 09/21/2011  . Obesity 09/21/2011  . Shoulder pain, right 09/21/2011  . Asthma, intermittent 09/21/2011  . Tobacco user 09/21/2011  . TRIGGER FINGER 11/03/2008    Deniece Ree PT, DPT [place] [address] [place], [place], [address] Phone: [phone]   Fax:  [phone]  Name: Marquis Diles MRN: [id number] Date of Birth: [birth year]

## 2016-04-26 ENCOUNTER — Ambulatory Visit (HOSPITAL_COMMUNITY): Payer: BLUE CROSS/BLUE SHIELD | Admitting: Physical Therapy

## 2016-04-26 ENCOUNTER — Telehealth (HOSPITAL_COMMUNITY): Payer: Self-pay | Admitting: Physical Therapy

## 2016-04-26 NOTE — Telephone Encounter (Signed)
Pt did not show for appointment.  Called and left message regarding NS and reminder for next appt on Friday at 8:15 am. Teena Irani, PTA/CLT [phone]

## 2016-04-28 ENCOUNTER — Ambulatory Visit (HOSPITAL_COMMUNITY): Payer: BLUE CROSS/BLUE SHIELD | Admitting: Physical Therapy

## 2016-04-28 DIAGNOSIS — M25671 Stiffness of right ankle, not elsewhere classified: Secondary | ICD-10-CM

## 2016-04-28 DIAGNOSIS — M6281 Muscle weakness (generalized): Secondary | ICD-10-CM

## 2016-04-28 DIAGNOSIS — M25571 Pain in right ankle and joints of right foot: Secondary | ICD-10-CM

## 2016-04-28 DIAGNOSIS — R2681 Unsteadiness on feet: Secondary | ICD-10-CM

## 2016-04-28 DIAGNOSIS — R2689 Other abnormalities of gait and mobility: Secondary | ICD-10-CM

## 2016-04-28 NOTE — Therapy (Signed)
[facility] [place], [place], [address] Phone: [phone]   Fax:  [phone]  Physical Therapy Treatment  Patient Details  Name: Robin Sherman MRN: [id number] Date of Birth: 10-Sep-1960 Referring Provider: Arther Abbott   Encounter Date: 04/28/2016      PT End of Session - 04/28/16 0832    Visit Number 11   Number of Visits 19   Date for PT Re-Evaluation 05/11/16   Authorization Type [employer]   Authorization Time Period 5/45/62 to 56/3/89; recert done on 37/3   PT Start Time 0818   PT Stop Time 0858   PT Time Calculation (min) 40 min   Activity Tolerance Patient tolerated treatment well;No increased pain   Behavior During Therapy [facility] for tasks assessed/performed      Past Medical History:  Diagnosis Date  . Asthma   . Eczema    Followed by Dr. [place] Crane dermatology  . Hypertension     Past Surgical History:  Procedure Laterality Date  . ABDOMINAL HYSTERECTOMY    . BILATERAL SALPINGECTOMY Bilateral 08/01/2013   Procedure: BILATERAL SALPINGECTOMY;  Surgeon: Jonnie Kind, MD;  [facility]: [facility];  Service: Gynecology;  Laterality: Bilateral;  . CHONDROPLASTY Left 01/14/2014   Procedure: CHONDROPLASTY OF FEMUR;  Surgeon: Carole Civil, MD;  Location: [facility];  Service: Orthopedics;  Laterality: Left;  . COLONOSCOPY N/A 01/27/2013   Procedure: COLONOSCOPY;  Surgeon: Danie Binder, MD;  [facility];  Service: Endoscopy;  Laterality: N/A;  8:30 AM  . HEMATOMA EVACUATION N/A 08/03/2013   Procedure: EVACUATION PELVIC HEMATOMA;  Surgeon: Jonnie Kind, MD;  Location: [facility];  Service: Gynecology;  Laterality: N/A;  . KNEE ARTHROSCOPY WITH MEDIAL MENISECTOMY Left 01/14/2014   Procedure: KNEE ARTHROSCOPY WITH PARTIAL MEDIAL MENISECTOMY;  Surgeon: Carole Civil, MD;  Location: [facility];  Service: Orthopedics;  Laterality: Left;  . SUPRACERVICAL ABDOMINAL HYSTERECTOMY N/A 08/01/2013   Procedure: HYSTERECTOMY  SUPRACERVICAL ABDOMINAL;  Surgeon: Jonnie Kind, MD;  Location: [facility];  Service: Gynecology;  Laterality: N/A;  . TUBAL LIGATION     [place]    There were no vitals filed for this visit.      Subjective Assessment - 04/28/16 0820    Subjective Pt reports things are going well today. She says the ABCs are rough, but she can do them. She tried putting gloves in her arches during work and it helped alot and she didn't have any pain afterwards.    Pertinent History Asthma, HTN   How long can you sit comfortably? 10/5- unlimited    How long can you stand comfortably? 10/5- 10 minutes    How long can you walk comfortably? 10/5- 30 minutes    Diagnostic tests Xray: negative for fracture, (+) bone spur, MRI 9/22 of the R foot: Thickening of the medial band of the plantar fascia with a small tear at the calcaneal insertion consistent with plantar fasciitis.  Ankle Joint: No joint effusion. 4.3 mm  osteochondral lesion involving the medial corner of the talar dome with overlying cartilage loss and subchondral reactive marrow changes.   Patient Stated Goals improve pain and mobility   Currently in Pain? No/denies                         [facility] Adult PT Treatment/Exercise - 04/28/16 0001      Manual Therapy   Manual Therapy Soft tissue mobilization;Joint mobilization  Manual therapy comments separate from all other interventions    Soft tissue mobilization STM Rt plantar fascia, STM/palmar spreading B gastroc soleus     Ankle Exercises: Seated   ABC's 2 reps  verbal cues to focus solely on ankle movement   Towel Crunch 2 reps   Toe Raise 15 reps  x2 sets     Ankle Exercises: Standing   Heel Raises 10 reps  x2 reps with weight shift Rt     Ankle Exercises: Supine   Other Supine Ankle Exercises bridge with feet on dyna disc 2x15     Ankle Exercises: Stretches   Gastroc Stretch 3 reps;30 seconds  slant board, weight shfit Rt                 PT  Education - 04/28/16 0834    Education provided Yes   Education Details importance of full HEP adherence and implications for foot support to allow muscles/tendons to have some relief    Person(s) Educated Patient   Methods Explanation   Comprehension Verbalized understanding          PT Short Term Goals - 04/20/16 0833      PT SHORT TERM GOAL #1   Title Pt will demo consistency and independence with her HEP to improve strength and mobility.   Baseline 10/5- once a day    Time 2   Period Weeks   Status Partially Met     PT SHORT TERM GOAL #2   Title Pt will report no greater than a 6/10 max pain during activity.   Baseline 10/5- pain remains a 10/10 sometimes, sometimes it is 0/10, sometimes it is just an ache   Status On-going     PT SHORT TERM GOAL #3   Title Pt will demo improved ankle DF to atleast 10 degrees to improve mechanics during gait.    Time 3   Period Weeks   Status Achieved           PT Long Term Goals - 04/20/16 8101      PT LONG TERM GOAL #1   Title Pt will demo improved BLE strength of 5/5 MMT (except ankle PF) to increase safety with mobility at work.    Time 6   Period Weeks   Status Achieved     PT LONG TERM GOAL #2   Title Pt will report improved activity tolerance evident by her ability to ambulate for up to 2 hours at work without more than 3/10 pain.    Baseline 10/5- sometimes she still has to get her boot due to pain    Time 6   Period Weeks   Status Partially Met     PT LONG TERM GOAL #3   Title Pt will demo improved balance evident by her ability to maintain SLS on each LE for up to 15 sec, 3/5 trials.    Baseline 10/5- 10 seconds L, 3 seconds R    Time 6   Period Weeks   Status On-going     PT LONG TERM GOAL #4   Title Pt will demo improved gastroc strength up to atleast 4/5 MMT, in order to assist during gait.   Baseline 10/5- ongoing gastroc weakness B   Time 6   Period Weeks   Status On-going               Plan  - 04/28/16 0855    Clinical Impression Statement Pt arrived today having made improvements  in ankle/foot pain strength and neuromuscular control. She was able to perform big toe raises on BLE without difficulty and minimal cues for proper technique. Encouraged pt to follow up with MD regarding foot orthotics for improved comfort during long shifts at work. Will continue with current POC.     Rehab Potential Good   Clinical Impairments Affecting Rehab Potential (-) working 12 hour days on her feet    PT Frequency 2x / week   PT Duration 3 weeks   PT Treatment/Interventions ADLs/Self Care Home Management;Cryotherapy;Therapeutic exercise;Therapeutic activities;Functional mobility training;Stair training;Gait training;Balance training;Manual techniques;Neuromuscular re-education;Patient/family education;Orthotic Fit/Training;Taping;Passive range of motion   PT Next Visit Plan ankle strengthening and balance. being to introduce weight bearing strengthening slowly    PT Home Exercise Plan HEP: ankle 4 way blue TB, bridge with green TB, hip abduction against wall; ankle circles, ankle alphabet, seated heel raises    Consulted and Agree with Plan of Care Patient      Patient will benefit from skilled therapeutic intervention in order to improve the following deficits and impairments:  Abnormal gait, Decreased activity tolerance, Decreased balance, Decreased strength, Difficulty walking, Pain, Impaired flexibility, Increased edema, Decreased range of motion, Decreased mobility, Postural dysfunction  Visit Diagnosis: Pain in right ankle and joints of right foot  Stiffness of right ankle, not elsewhere classified  Other abnormalities of gait and mobility  Muscle weakness (generalized)  Unsteadiness on feet     Problem List Patient Active Problem List   Diagnosis Date Noted  . Borderline diabetes 07/02/2015  . Vitamin D deficiency 07/02/2015  . Lumbar back pain 07/02/2015  . Acute meniscal  tear of left knee 12/23/2013  . Bursitis of left knee 11/03/2013  . Insomnia 10/29/2013  . Sciatica of left side 10/13/2013  . Arthritis of knee, degenerative 10/13/2013  . Left knee pain 10/13/2013  . Pain, joint, multiple sites 04/15/2013  . Arthritis 12/02/2012  . Headache(784.0) 09/25/2012  . Hand dermatitis 03/02/2012  . Essential hypertension, benign 09/21/2011  . Obesity 09/21/2011  . Shoulder pain, right 09/21/2011  . Asthma, intermittent 09/21/2011  . Tobacco user 09/21/2011  . TRIGGER FINGER 11/03/2008   9:01 AM,04/28/16 Elly Modena PT, DPT [facility] [address] [place], [place], [address] Phone: [phone]   Fax:  [phone]  Name: Robin Sherman MRN: [id number] Date of Birth: 03-23-1961

## 2016-05-03 ENCOUNTER — Ambulatory Visit (HOSPITAL_COMMUNITY): Payer: BLUE CROSS/BLUE SHIELD | Admitting: Physical Therapy

## 2016-05-03 DIAGNOSIS — M25571 Pain in right ankle and joints of right foot: Secondary | ICD-10-CM | POA: Diagnosis not present

## 2016-05-03 DIAGNOSIS — M25671 Stiffness of right ankle, not elsewhere classified: Secondary | ICD-10-CM

## 2016-05-03 DIAGNOSIS — M6281 Muscle weakness (generalized): Secondary | ICD-10-CM

## 2016-05-03 DIAGNOSIS — R2689 Other abnormalities of gait and mobility: Secondary | ICD-10-CM

## 2016-05-03 DIAGNOSIS — R2681 Unsteadiness on feet: Secondary | ICD-10-CM

## 2016-05-03 NOTE — Therapy (Signed)
[place], [place], [address] Phone: [phone]   Fax:  [phone]  Physical Therapy Treatment  Patient Details  Name: Robin Sherman MRN: [id number] Date of Birth: [birth year] Referring Provider: Arther Abbott   Encounter Date: 05/03/2016      PT End of Session - 05/03/16 0900    Visit Number 12   Number of Visits 19   Date for PT Re-Evaluation 05/11/16   Authorization Type [employer]   Authorization Time Period 1/44/31 to 54/0/08; recert done on 67/6   PT Start Time 0820   PT Stop Time 0859   PT Time Calculation (min) 39 min   Activity Tolerance Patient tolerated treatment well;No increased pain   Behavior During Therapy WFL for tasks assessed/performed      Past Medical History:  Diagnosis Date  . Asthma   . Eczema    Followed by Dr. Nevada Crane dermatology  . Hypertension     Past Surgical History:  Procedure Laterality Date  . ABDOMINAL HYSTERECTOMY    . BILATERAL SALPINGECTOMY Bilateral 08/01/2013   Procedure: BILATERAL SALPINGECTOMY;  Surgeon: Jonnie Kind, MD;  Location: [facility];  Service: Gynecology;  Laterality: Bilateral;  . CHONDROPLASTY Left 01/14/2014   Procedure: CHONDROPLASTY OF FEMUR;  Surgeon: Carole Civil, MD;  Location: [facility];  Service: Orthopedics;  Laterality: Left;  . COLONOSCOPY N/A 01/27/2013   Procedure: COLONOSCOPY;  Surgeon: Danie Binder, MD;  [facility]: [facility];  Service: Endoscopy;  Laterality: N/A;  8:30 AM  . HEMATOMA EVACUATION N/A 08/03/2013   Procedure: EVACUATION PELVIC HEMATOMA;  Surgeon: Jonnie Kind, MD;  Location: [facility];  Service: Gynecology;  Laterality: N/A;  . KNEE ARTHROSCOPY WITH MEDIAL MENISECTOMY Left 01/14/2014   Procedure: KNEE ARTHROSCOPY WITH PARTIAL MEDIAL MENISECTOMY;  Surgeon: Carole Civil, MD;  Location: [facility];  Service: Orthopedics;  Laterality: Left;  . SUPRACERVICAL ABDOMINAL HYSTERECTOMY N/A 08/01/2013   Procedure: HYSTERECTOMY  SUPRACERVICAL ABDOMINAL;  Surgeon: Jonnie Kind, MD;  Location: [facility];  Service: Gynecology;  Laterality: N/A;  . TUBAL LIGATION     [place]    There were no vitals filed for this visit.      Subjective Assessment - 05/03/16 0910    Subjective Pt states she has not had any pain since getting off work Monday.  States she is currently painfree.   Currently in Pain? No/denies                         [facility] Adult PT Treatment/Exercise - 05/03/16 0001      Manual Therapy   Manual Therapy Soft tissue mobilization;Joint mobilization   Manual therapy comments separate from all other interventions    Soft tissue mobilization STM Rt plantar fascia, STM/palmar spreading B gastroc soleus     Ankle Exercises: Standing   SLS 3x  max 7" on Rt, 20" on Lt   Rocker Board 2 minutes  Rt/Lt and A/P   Heel Raises 15 reps   Toe Raise 15 reps     Ankle Exercises: Stretches   Gastroc Stretch 3 reps;30 seconds     Ankle Exercises: Supine   Other Supine Ankle Exercises bridge with feet on dyna disc 2x15                  PT Short Term Goals - 04/20/16 0833      PT SHORT TERM GOAL #1   Title Pt  will demo consistency and independence with her HEP to improve strength and mobility.   Baseline 10/5- once a day    Time 2   Period Weeks   Status Partially Met     PT SHORT TERM GOAL #2   Title Pt will report no greater than a 6/10 max pain during activity.   Baseline 10/5- pain remains a 10/10 sometimes, sometimes it is 0/10, sometimes it is just an ache   Status On-going     PT SHORT TERM GOAL #3   Title Pt will demo improved ankle DF to atleast 10 degrees to improve mechanics during gait.    Time 3   Period Weeks   Status Achieved           PT Long Term Goals - 04/20/16 7425      PT LONG TERM GOAL #1   Title Pt will demo improved BLE strength of 5/5 MMT (except ankle PF) to increase safety with mobility at work.    Time 6   Period Weeks   Status  Achieved     PT LONG TERM GOAL #2   Title Pt will report improved activity tolerance evident by her ability to ambulate for up to 2 hours at work without more than 3/10 pain.    Baseline 10/5- sometimes she still has to get her boot due to pain    Time 6   Period Weeks   Status Partially Met     PT LONG TERM GOAL #3   Title Pt will demo improved balance evident by her ability to maintain SLS on each LE for up to 15 sec, 3/5 trials.    Baseline 10/5- 10 seconds L, 3 seconds R    Time 6   Period Weeks   Status On-going     PT LONG TERM GOAL #4   Title Pt will demo improved gastroc strength up to atleast 4/5 MMT, in order to assist during gait.   Baseline 10/5- ongoing gastroc weakness B   Time 6   Period Weeks   Status On-going               Plan - 05/03/16 0900    Clinical Impression Statement Pt arrives today painfree since yesterday.  Progressed to static standing balance.  Inability to balance on Rt foot greater than 7 seconds, 20 seconds on Lt.  Completed rockerboard without difficulty but unable to manuever BAPS without increased pain in ankle.  Manual completed with most tenderness in posterior medial malleoli along flexor hallicus longus tendon.  Pt contiued to be painfree at end of session.   Rehab Potential Good   Clinical Impairments Affecting Rehab Potential (-) working 12 hour days on her feet    PT Frequency 2x / week   PT Duration 3 weeks   PT Treatment/Interventions ADLs/Self Care Home Management;Cryotherapy;Therapeutic exercise;Therapeutic activities;Functional mobility training;Stair training;Gait training;Balance training;Manual techniques;Neuromuscular re-education;Patient/family education;Orthotic Fit/Training;Taping;Passive range of motion   PT Next Visit Plan ankle strengthening and balance. being to introduce weight bearing strengthening slowly.  Begin seated BAPS next session.    PT Home Exercise Plan HEP: ankle 4 way blue TB, bridge with green TB, hip  abduction against wall; ankle circles, ankle alphabet, seated heel raises    Consulted and Agree with Plan of Care Patient      Patient will benefit from skilled therapeutic intervention in order to improve the following deficits and impairments:  Abnormal gait, Decreased activity tolerance, Decreased balance, Decreased strength, Difficulty walking,  Pain, Impaired flexibility, Increased edema, Decreased range of motion, Decreased mobility, Postural dysfunction  Visit Diagnosis: Pain in right ankle and joints of right foot  Stiffness of right ankle, not elsewhere classified  Other abnormalities of gait and mobility  Muscle weakness (generalized)  Unsteadiness on feet     Problem List Patient Active Problem List   Diagnosis Date Noted  . Borderline diabetes 07/02/2015  . Vitamin D deficiency 07/02/2015  . Lumbar back pain 07/02/2015  . Acute meniscal tear of left knee 12/23/2013  . Bursitis of left knee 11/03/2013  . Insomnia 10/29/2013  . Sciatica of left side 10/13/2013  . Arthritis of knee, degenerative 10/13/2013  . Left knee pain 10/13/2013  . Pain, joint, multiple sites 04/15/2013  . Arthritis 12/02/2012  . Headache(784.0) 09/25/2012  . Hand dermatitis 03/02/2012  . Essential hypertension, benign 09/21/2011  . Obesity 09/21/2011  . Shoulder pain, right 09/21/2011  . Asthma, intermittent 09/21/2011  . Tobacco user 09/21/2011  . TRIGGER FINGER 11/03/2008    Teena Irani, PTA/CLT [phone]  05/03/2016, 9:11 AM  [place], [place], [address] Phone: [phone]   Fax:  [phone]  Name: Robin Sherman MRN: [id number] Date of Birth: October 23, 1960

## 2016-05-04 ENCOUNTER — Ambulatory Visit (HOSPITAL_COMMUNITY): Payer: BLUE CROSS/BLUE SHIELD

## 2016-05-04 DIAGNOSIS — M25571 Pain in right ankle and joints of right foot: Secondary | ICD-10-CM

## 2016-05-04 DIAGNOSIS — M25671 Stiffness of right ankle, not elsewhere classified: Secondary | ICD-10-CM

## 2016-05-04 DIAGNOSIS — M6281 Muscle weakness (generalized): Secondary | ICD-10-CM

## 2016-05-04 DIAGNOSIS — R2681 Unsteadiness on feet: Secondary | ICD-10-CM

## 2016-05-04 DIAGNOSIS — R2689 Other abnormalities of gait and mobility: Secondary | ICD-10-CM

## 2016-05-04 NOTE — Therapy (Signed)
[place], [place], [address] Phone: [phone]   Fax:  [phone]  Physical Therapy Treatment  Patient Details  Name: Robin Sherman MRN: [id number] Date of Birth: 05-02-1961 Referring Provider: Arther Abbott   Encounter Date: 05/04/2016      PT End of Session - 05/04/16 0820    Visit Number 13   Number of Visits 19   Date for PT Re-Evaluation 05/11/16   Authorization Type [employer]   Authorization Time Period 8/93/81 to 07/23/49; recert done on 02/5   PT Start Time 0818   PT Stop Time 0858   PT Time Calculation (min) 40 min   Activity Tolerance Patient tolerated treatment well;No increased pain   Behavior During Therapy WFL for tasks assessed/performed      Past Medical History:  Diagnosis Date  . Asthma   . Eczema    Followed by Dr. [place] Crane dermatology  . Hypertension     Past Surgical History:  Procedure Laterality Date  . ABDOMINAL HYSTERECTOMY    . BILATERAL SALPINGECTOMY Bilateral 08/01/2013   Procedure: BILATERAL SALPINGECTOMY;  Surgeon: Jonnie Kind, MD;  Location: [facility];  Service: Gynecology;  Laterality: Bilateral;  . CHONDROPLASTY Left 01/14/2014   Procedure: CHONDROPLASTY OF FEMUR;  Surgeon: Carole Civil, MD;  Location: [facility];  Service: Orthopedics;  Laterality: Left;  . COLONOSCOPY N/A 01/27/2013   Procedure: COLONOSCOPY;  Surgeon: Danie Binder, MD;  [facility];  Service: Endoscopy;  Laterality: N/A;  8:30 AM  . HEMATOMA EVACUATION N/A 08/03/2013   Procedure: EVACUATION PELVIC HEMATOMA;  Surgeon: Jonnie Kind, MD;  Location: [facility];  Service: Gynecology;  Laterality: N/A;  . KNEE ARTHROSCOPY WITH MEDIAL MENISECTOMY Left 01/14/2014   Procedure: KNEE ARTHROSCOPY WITH PARTIAL MEDIAL MENISECTOMY;  Surgeon: Carole Civil, MD;  Location: [facility];  Service: Orthopedics;  Laterality: Left;  . SUPRACERVICAL ABDOMINAL HYSTERECTOMY N/A 08/01/2013   Procedure: HYSTERECTOMY  SUPRACERVICAL ABDOMINAL;  Surgeon: Jonnie Kind, MD;  Location: [facility];  Service: Gynecology;  Laterality: N/A;  . TUBAL LIGATION     [place]    There were no vitals filed for this visit.      Subjective Assessment - 05/04/16 0819    Subjective Pt stated her foot is feeling great today with reports of no pain for a week now.     Pertinent History Asthma, HTN   Patient Stated Goals improve pain and mobility   Currently in Pain? No/denies                         [facility] Adult PT Treatment/Exercise - 05/04/16 0001      Manual Therapy   Manual Therapy Soft tissue mobilization   Manual therapy comments separate from all other interventions    Soft tissue mobilization STM Rt plantar fascia, STM/palmar spreading B gastroc soleus     Ankle Exercises: Standing   BAPS Sitting;Level 2  attempted standing increased pain   SLS 3x  Rt 19", Lt 18"   Rocker Board 2 minutes  R/L and A/P   Heel Raises 15 reps   Toe Raise 15 reps     Ankle Exercises: Seated   BAPS Sitting;Level 2;10 reps     Ankle Exercises: Stretches   Slant Board Stretch 3 reps;30 seconds                  PT Short Term Goals - 04/20/16  0833      PT SHORT TERM GOAL #1   Title Pt will demo consistency and independence with her HEP to improve strength and mobility.   Baseline 10/5- once a day    Time 2   Period Weeks   Status Partially Met     PT SHORT TERM GOAL #2   Title Pt will report no greater than a 6/10 max pain during activity.   Baseline 10/5- pain remains a 10/10 sometimes, sometimes it is 0/10, sometimes it is just an ache   Status On-going     PT SHORT TERM GOAL #3   Title Pt will demo improved ankle DF to atleast 10 degrees to improve mechanics during gait.    Time 3   Period Weeks   Status Achieved           PT Long Term Goals - 04/20/16 8938      PT LONG TERM GOAL #1   Title Pt will demo improved BLE strength of 5/5 MMT (except ankle PF) to increase  safety with mobility at work.    Time 6   Period Weeks   Status Achieved     PT LONG TERM GOAL #2   Title Pt will report improved activity tolerance evident by her ability to ambulate for up to 2 hours at work without more than 3/10 pain.    Baseline 10/5- sometimes she still has to get her boot due to pain    Time 6   Period Weeks   Status Partially Met     PT LONG TERM GOAL #3   Title Pt will demo improved balance evident by her ability to maintain SLS on each LE for up to 15 sec, 3/5 trials.    Baseline 10/5- 10 seconds L, 3 seconds R    Time 6   Period Weeks   Status On-going     PT LONG TERM GOAL #4   Title Pt will demo improved gastroc strength up to atleast 4/5 MMT, in order to assist during gait.   Baseline 10/5- ongoing gastroc weakness B   Time 6   Period Weeks   Status On-going               Plan - 05/04/16 1017    Clinical Impression Statement Pt progressing well with reoprts of pain free for a week.  Pt reports ability to stand comfortably for 2 hours with work prior need to seat and continues to wear boot with work based activities if stands for 4 hours.  Continued with seated BAPS board to improve ankle stabiltiy as standing increases pain on lateral aspect of ankle, pt able to demonstrate good form and technqiue following initial verbal cueing and demonstration pain free.  Pt progressing well with SLS activities wtih ability to SLS Rt 19" and Lt 18" max of 3 attempts.  Ended session with manual soft tissue mobilization to reduce tenderness lateral aspect.  No reports of pain at end of session.     Rehab Potential Good   Clinical Impairments Affecting Rehab Potential (-) working 12 hour days on her feet    PT Frequency 2x / week   PT Duration 3 weeks   PT Treatment/Interventions ADLs/Self Care Home Management;Cryotherapy;Therapeutic exercise;Therapeutic activities;Functional mobility training;Stair training;Gait training;Balance training;Manual  techniques;Neuromuscular re-education;Patient/family education;Orthotic Fit/Training;Taping;Passive range of motion   PT Next Visit Plan ankle strengthening and balance. being to introduce weight bearing strengthening slowly.  Progress to standing BAPS as able.   PT  Home Exercise Plan HEP: ankle 4 way blue TB, bridge with green TB, hip abduction against wall; ankle circles, ankle alphabet, seated heel raises       Patient will benefit from skilled therapeutic intervention in order to improve the following deficits and impairments:  Abnormal gait, Decreased activity tolerance, Decreased balance, Decreased strength, Difficulty walking, Pain, Impaired flexibility, Increased edema, Decreased range of motion, Decreased mobility, Postural dysfunction  Visit Diagnosis: Pain in right ankle and joints of right foot  Stiffness of right ankle, not elsewhere classified  Other abnormalities of gait and mobility  Muscle weakness (generalized)  Unsteadiness on feet     Problem List Patient Active Problem List   Diagnosis Date Noted  . Borderline diabetes 07/02/2015  . Vitamin D deficiency 07/02/2015  . Lumbar back pain 07/02/2015  . Acute meniscal tear of left knee 12/23/2013  . Bursitis of left knee 11/03/2013  . Insomnia 10/29/2013  . Sciatica of left side 10/13/2013  . Arthritis of knee, degenerative 10/13/2013  . Left knee pain 10/13/2013  . Pain, joint, multiple sites 04/15/2013  . Arthritis 12/02/2012  . Headache(784.0) 09/25/2012  . Hand dermatitis 03/02/2012  . Essential hypertension, benign 09/21/2011  . Obesity 09/21/2011  . Shoulder pain, right 09/21/2011  . Asthma, intermittent 09/21/2011  . Tobacco user 09/21/2011  . TRIGGER FINGER 11/03/2008   Ihor Austin, [place]; [place]  Aldona Lento 05/04/2016, 10:24 AM  [place], [place], [address] Phone: [phone]   Fax:   [phone]  Name: Robin Sherman MRN: [id number] Date of Birth: Aug 13, 1960

## 2016-05-09 ENCOUNTER — Ambulatory Visit (HOSPITAL_COMMUNITY): Payer: BLUE CROSS/BLUE SHIELD | Admitting: Physical Therapy

## 2016-05-09 DIAGNOSIS — M25671 Stiffness of right ankle, not elsewhere classified: Secondary | ICD-10-CM

## 2016-05-09 DIAGNOSIS — R2689 Other abnormalities of gait and mobility: Secondary | ICD-10-CM

## 2016-05-09 DIAGNOSIS — R2681 Unsteadiness on feet: Secondary | ICD-10-CM

## 2016-05-09 DIAGNOSIS — M6281 Muscle weakness (generalized): Secondary | ICD-10-CM

## 2016-05-09 DIAGNOSIS — M25571 Pain in right ankle and joints of right foot: Secondary | ICD-10-CM | POA: Diagnosis not present

## 2016-05-09 NOTE — Therapy (Signed)
[address] [place], [place], [address] Phone: [phone]   Fax:  [phone]  Physical Therapy Treatment/Discharge   Patient Details  Name: Robin Sherman MRN: [id number] Date of Birth: 05-17-1961 Referring Provider: Arther Abbott, MD  Encounter Date: 05/09/2016      PT End of Session - 05/09/16 0844    Visit Number 14   Number of Visits 19   Date for PT Re-Evaluation 05/11/16   Authorization Type [employer]   Authorization Time Period 5/57/32 to 20/2/54; recert done on 27/0   PT Start Time 0816   PT Stop Time 0842   PT Time Calculation (min) 26 min   Activity Tolerance Patient tolerated treatment well;No increased pain   Behavior During Therapy WFL for tasks assessed/performed      Past Medical History:  Diagnosis Date  . Asthma   . Eczema    Followed by Dr. Nevada Crane dermatology  . Hypertension     Past Surgical History:  Procedure Laterality Date  . ABDOMINAL HYSTERECTOMY    . BILATERAL SALPINGECTOMY Bilateral 08/01/2013   Procedure: BILATERAL SALPINGECTOMY;  Surgeon: Jonnie Kind, MD;  Location: [facility];  Service: Gynecology;  Laterality: Bilateral;  . CHONDROPLASTY Left 01/14/2014   Procedure: CHONDROPLASTY OF FEMUR;  Surgeon: Carole Civil, MD;  Location: [facility];  Service: Orthopedics;  Laterality: Left;  . COLONOSCOPY N/A 01/27/2013   Procedure: COLONOSCOPY;  Surgeon: Danie Binder, MD;  Location: [facility];  Service: Endoscopy;  Laterality: N/A;  8:30 AM  . HEMATOMA EVACUATION N/A 08/03/2013   Procedure: EVACUATION PELVIC HEMATOMA;  Surgeon: Jonnie Kind, MD;  Location: [facility];  Service: Gynecology;  Laterality: N/A;  . KNEE ARTHROSCOPY WITH MEDIAL MENISECTOMY Left 01/14/2014   Procedure: KNEE ARTHROSCOPY WITH PARTIAL MEDIAL MENISECTOMY;  Surgeon: Carole Civil, MD;  Location: [facility];  Service: Orthopedics;  Laterality: Left;  . SUPRACERVICAL ABDOMINAL HYSTERECTOMY N/A 08/01/2013   Procedure:  HYSTERECTOMY SUPRACERVICAL ABDOMINAL;  Surgeon: Jonnie Kind, MD;  Location: [facility];  Service: Gynecology;  Laterality: N/A;  . TUBAL LIGATION     [place]    There were no vitals filed for this visit.      Subjective Assessment - 05/09/16 0821    Subjective Pt states she is doing well and has made a 100% improvement since beginning PT. She reports that her exercises are going ok. She notices she is able to work 4-5 hours before she has to put her boot back on to manage pain in her foot.    Pertinent History Asthma, HTN   How long can you sit comfortably? unlimited    How long can you stand comfortably? 4-5 hours   How long can you walk comfortably? 4-5 hours   Diagnostic tests Xray: negative for fracture, (+) bone spur, MRI 9/22 of the R foot: Thickening of the medial band of the plantar fascia with a small tear at the calcaneal insertion consistent with plantar fasciitis.  Ankle Joint: No joint effusion. 4.3 mm  osteochondral lesion involving the medial corner of the talar dome with overlying cartilage loss and subchondral reactive marrow changes.   Patient Stated Goals improve pain and mobility   Currently in Pain? No/denies            [facility] PT Assessment - 05/09/16 0001      Assessment   Medical Diagnosis Rt ankle pain   Referring Provider Arther Abbott, MD   Next MD Visit 06/05/16  Prior Therapy none      Balance Screen   Has the patient fallen in the past 6 months No   Has the patient had a decrease in activity level because of a fear of falling?  No   Is the patient reluctant to leave their home because of a fear of falling?  No     Prior Function   Level of Independence Independent   Vocation Full time employment   Vocation Requirements CNA      AROM   Right Ankle Dorsiflexion 12   Right Ankle Inversion 28   Right Ankle Eversion 10   Left Ankle Dorsiflexion 12   Left Ankle Inversion 20   Left Ankle Eversion 15     Strength   Right Hip Extension  5/5   Right Hip ABduction 5/5   Left Hip Extension 5/5   Left Hip ABduction 5/5   Right Ankle Dorsiflexion 5/5   Right Ankle Inversion 5/5   Right Ankle Eversion 5/5   Left Ankle Dorsiflexion 5/5   Left Ankle Inversion 5/5     Palpation   Palpation comment Rt proximal plantar fascia tender with palpation      High Level Balance   High Level Balance Comments BLE 10 sec, no pain                              PT Education - 05/09/16 0841    Education provided Yes   Education Details updated HEP; encouraged pt to follow up with MD concerning foot orthotics; discussed ways to grade her return to activity and provided written schedule for this; discussed ways to address plantar fascia stiffness with untucking her sheets and using tennins ball to massage in the mornings; discussed end of POC   Person(s) Educated Patient   Methods Explanation;Handout   Comprehension Verbalized understanding          PT Short Term Goals - 05/09/16 0851      PT SHORT TERM GOAL #1   Title Pt will demo consistency and independence with her HEP to improve strength and mobility.   Baseline 10/5- once a day    Time 2   Period Weeks   Status Achieved     PT SHORT TERM GOAL #2   Title Pt will report no greater than a 6/10 max pain during activity.   Baseline hardly bothers her at all   Status Achieved     PT SHORT TERM GOAL #3   Title Pt will demo improved ankle DF to atleast 10 degrees to improve mechanics during gait.    Time 3   Period Weeks   Status Achieved           PT Long Term Goals - 05/09/16 8841      PT LONG TERM GOAL #1   Title Pt will demo improved BLE strength of 5/5 MMT (except ankle PF) to increase safety with mobility at work.    Time 6   Period Weeks   Status Achieved     PT LONG TERM GOAL #2   Title Pt will report improved activity tolerance evident by her ability to ambulate for up to 2 hours at work without more than 3/10 pain.    Time 6    Period Weeks   Status Achieved     PT LONG TERM GOAL #3   Title Pt will demo improved balance evident by her ability to  maintain SLS on each LE for up to 15 sec, 3/5 trials.    Baseline BLE 10 sec, no pain    Time 6   Period Weeks   Status Achieved     PT LONG TERM GOAL #4   Title Pt will demo improved gastroc strength up to atleast 4/5 MMT, in order to assist during gait.   Baseline 10/5- ongoing gastroc weakness B   Time 6   Period Weeks   Status Achieved               Plan - 05/09/16 0844    Clinical Impression Statement Pt has made great progress since beginning PT several weeks ago, reporting 100% improvement in foot pain. She demonstrates 5/5 BLE strength as well as improved ankle ROM and balance up to 10 sec on each LE without report of pain. She reports consistent adherence to her HEP and feels pleased with her progress made so far. Noted some tenderness along her Rt proximal plantar fascia as well as stiffness reported in the mornings, so I discussed ways to address this with massage and avoiding keeping her foot in a plantar flexed position at night. Her neuromuscular control of her foot has improved significantly evident by the need of decreased cues during great toe active extension/flexion during previous sessions and it should continue to improve with adherence to her updated HEP. Pt no longer requires skilled PT and has been able to return to work without pain for 4-5 hours, she is being discharged from PT with advanced HEP and being pleased with her progress.   Rehab Potential Good   Clinical Impairments Affecting Rehab Potential (-) working 12 hour days on her feet    PT Frequency 2x / week   PT Duration 3 weeks   PT Treatment/Interventions ADLs/Self Care Home Management;Cryotherapy;Therapeutic exercise;Therapeutic activities;Functional mobility training;Stair training;Gait training;Balance training;Manual techniques;Neuromuscular re-education;Patient/family  education;Orthotic Fit/Training;Taping;Passive range of motion   PT Next Visit Plan d/c    PT Home Exercise Plan HEP: ankle 4 way blue TB, great toe ext; ankle alphabet, seated heel raises    Recommended Other Services would benefit from foot orthotic to improve work tolerance   Consulted and Agree with Plan of Care Patient      Patient will benefit from skilled therapeutic intervention in order to improve the following deficits and impairments:  Abnormal gait, Decreased activity tolerance, Decreased balance, Decreased strength, Difficulty walking, Pain, Impaired flexibility, Increased edema, Decreased range of motion, Decreased mobility, Postural dysfunction  Visit Diagnosis: Pain in right ankle and joints of right foot  Stiffness of right ankle, not elsewhere classified  Other abnormalities of gait and mobility  Muscle weakness (generalized)  Unsteadiness on feet    PHYSICAL THERAPY DISCHARGE SUMMARY  Visits from Start of Care: 14  Current functional level related to goals / functional outcomes: Strength 5/5 MMT, ankle ROM atleast 10 deg DF, minimal pain and improved tolerance to work up to 4-5 hours at a time on her feet. Independence and consistent adherence to HEP   Remaining deficits: Tenderness with palpation along Rt proximal plantar fascia   Education / Equipment: Advanced HEP; graded return to daily walking/exercise; encouraged pt to follow up with MD concerning foot orthotics Plan: Patient agrees to discharge.  Patient goals were met. Patient is being discharged due to being pleased with the current functional level.  ?????          Problem List Patient Active Problem List   Diagnosis Date Noted  .  Borderline diabetes 07/02/2015  . Vitamin D deficiency 07/02/2015  . Lumbar back pain 07/02/2015  . Acute meniscal tear of left knee 12/23/2013  . Bursitis of left knee 11/03/2013  . Insomnia 10/29/2013  . Sciatica of left side 10/13/2013  . Arthritis of  knee, degenerative 10/13/2013  . Left knee pain 10/13/2013  . Pain, joint, multiple sites 04/15/2013  . Arthritis 12/02/2012  . Headache(784.0) 09/25/2012  . Hand dermatitis 03/02/2012  . Essential hypertension, benign 09/21/2011  . Obesity 09/21/2011  . Shoulder pain, right 09/21/2011  . Asthma, intermittent 09/21/2011  . Tobacco user 09/21/2011  . TRIGGER FINGER 11/03/2008    8:55 AM,05/09/16 Elly Modena PT, DPT [facility] [address] [place], [place], [address] Phone: [phone]   Fax:  [phone]  Name: Oniyah Rohe MRN: [id number] Date of Birth: 01-09-1961

## 2016-05-11 ENCOUNTER — Ambulatory Visit (HOSPITAL_COMMUNITY): Payer: BLUE CROSS/BLUE SHIELD | Admitting: Physical Therapy

## 2016-05-15 ENCOUNTER — Encounter: Payer: Self-pay | Admitting: Family Medicine

## 2016-05-15 ENCOUNTER — Ambulatory Visit (INDEPENDENT_AMBULATORY_CARE_PROVIDER_SITE_OTHER): Payer: BLUE CROSS/BLUE SHIELD | Admitting: Family Medicine

## 2016-05-15 VITALS — BP 130/78 | HR 98 | Temp 98.5°F | Resp 14 | Ht 65.0 in | Wt 231.0 lb

## 2016-05-15 DIAGNOSIS — E6609 Other obesity due to excess calories: Secondary | ICD-10-CM | POA: Diagnosis not present

## 2016-05-15 DIAGNOSIS — I10 Essential (primary) hypertension: Secondary | ICD-10-CM

## 2016-05-15 DIAGNOSIS — R7303 Prediabetes: Secondary | ICD-10-CM

## 2016-05-15 DIAGNOSIS — Z23 Encounter for immunization: Secondary | ICD-10-CM | POA: Diagnosis not present

## 2016-05-15 DIAGNOSIS — IMO0001 Reserved for inherently not codable concepts without codable children: Secondary | ICD-10-CM

## 2016-05-15 DIAGNOSIS — Z6838 Body mass index (BMI) 38.0-38.9, adult: Secondary | ICD-10-CM

## 2016-05-15 LAB — BASIC METABOLIC PANEL
BUN: 13 mg/dL (ref 7–25)
CALCIUM: 9.7 mg/dL (ref 8.6–10.4)
CO2: 27 mmol/L (ref 20–31)
Chloride: 104 mmol/L (ref 98–110)
Creat: 0.75 mg/dL (ref 0.50–1.05)
Glucose, Bld: 91 mg/dL (ref 70–99)
POTASSIUM: 4.2 mmol/L (ref 3.5–5.3)
SODIUM: 140 mmol/L (ref 135–146)

## 2016-05-15 MED ORDER — HALOBETASOL PROPIONATE 0.05 % EX CREA: TOPICAL_CREAM | CUTANEOUS | 2 refills | Status: DC

## 2016-05-15 MED ORDER — AMLODIPINE BESYLATE 10 MG PO TABS: 10.0000 mg | ORAL_TABLET | Freq: Every day | ORAL | 6 refills | Status: DC

## 2016-05-15 MED ORDER — HYDROCHLOROTHIAZIDE 25 MG PO TABS: 25.0000 mg | ORAL_TABLET | Freq: Every day | ORAL | 6 refills | Status: DC

## 2016-05-15 MED ORDER — ZOLPIDEM TARTRATE 10 MG PO TABS
ORAL_TABLET | ORAL | 2 refills | Status: DC
Start: 2016-05-15 — End: 2017-03-12

## 2016-05-15 NOTE — Progress Notes (Signed)
   Subjective:    Patient ID: Robin Sherman, female    DOB: [birth year], 55 y.o.   MRN: [id number]  Patient presents for 4 month F/U (is fasting) Is here to follow-up chronic medical problems. She has no specific concerns today. She is due for flu shot. She's history of hypertension borderline diabetes her last A1c was 6.4%. She is taking her blood pressure medicine as prescribed. Her weight is up another 2 pounds since I last saw her. She states that she was doing well and it got down to an 225 however she is now on prednisone and she's been snacking more. We discussed healthy eating choices like we have done in the past.  Continue to follow orthopedics for plantar fasciitis in her chronic knee pain she is on hydrocodone was recently placed on prednisone for what looks like about 6 weeks for her plantar fasciitis She requests a refill on her sleeping medicine Ambien   Review Of Systems:  GEN- denies fatigue, fever, weight loss,weakness, recent illness HEENT- denies eye drainage, change in vision, nasal discharge, CVS- denies chest pain, palpitations RESP- denies SOB, cough, wheeze ABD- denies N/V, change in stools, abd pain GU- denies dysuria, hematuria, dribbling, incontinence MSK- +joint pain, muscle aches, injury Neuro- denies headache, dizziness, syncope, seizure activity       Objective:    BP 130/78 (BP Location: Left Arm, Patient Position: Sitting, Cuff Size: Large)   Pulse 98   Temp 98.5 F (36.9 C) (Oral)   Resp 14   Ht 5\' 5"  (1.651 m)   Wt 231 lb (104.8 kg)   LMP 04/15/2013   SpO2 98%   BMI 38.44 kg/m  GEN- NAD, alert and oriented x3 HEENT- PERRL, EOMI, non injected sclera, pink conjunctiva, MMM, oropharynx clear Neck- Supple, no thyromegaly CVS- RRR, no murmur RESP-CTAB EXT- No edema Pulses- Radial, DP- 2+        Assessment & Plan:      Problem List Items Addressed This Visit    Obesity   Essential hypertension, benign - Primary    Blood pressure is  well-controlled no change her current medications.  Discussed  healthy eating cutting out unhealthycarbs we discussed the white foods that she can cut down on as well as sugary beverages. She can get some weight down it would improve her overall activity which will help her chronic knee pain as well. We'll recheck her A1c today and she is at a 6.5% are high we'll start metformin therapy.  Flu shot given      Relevant Medications   amLODipine (NORVASC) 10 MG tablet   hydrochlorothiazide (HYDRODIURIL) 25 MG tablet   Other Relevant Orders   Basic metabolic panel   Borderline diabetes   Relevant Orders   Hemoglobin [id number]   Basic metabolic panel    Other Visit Diagnoses   None.     Note: This dictation was prepared with Dragon dictation along with smaller phrase technology. Any transcriptional errors that result from this process are unintentional.

## 2016-05-15 NOTE — Addendum Note (Signed)
Addended by: Sheral Flow on: 05/15/2016 10:05 AM   Modules accepted: Orders

## 2016-05-15 NOTE — Patient Instructions (Addendum)
F/U 4 months Flu shot 

## 2016-05-15 NOTE — Assessment & Plan Note (Addendum)
Blood pressure is well-controlled no change her current medications.  Discussed  healthy eating cutting out unhealthycarbs we discussed the white foods that she can cut down on as well as sugary beverages. She can get some weight down it would improve her overall activity which will help her chronic knee pain as well. We'll recheck her A1c today and she is at a 6.5% are high we'll start metformin therapy.  Flu shot given

## 2016-05-16 LAB — HEMOGLOBIN A1C
Hgb A1c MFr Bld: 6.1 % — ABNORMAL HIGH (ref <5.7)
MEAN PLASMA GLUCOSE: 128 mg/dL

## 2016-05-18 ENCOUNTER — Encounter: Payer: Self-pay | Admitting: *Deleted

## 2016-06-05 ENCOUNTER — Ambulatory Visit (INDEPENDENT_AMBULATORY_CARE_PROVIDER_SITE_OTHER): Payer: BLUE CROSS/BLUE SHIELD | Admitting: Orthopedic Surgery

## 2016-06-05 ENCOUNTER — Encounter: Payer: Self-pay | Admitting: Orthopedic Surgery

## 2016-06-05 DIAGNOSIS — M76829 Posterior tibial tendinitis, unspecified leg: Secondary | ICD-10-CM

## 2016-06-05 DIAGNOSIS — M2140 Flat foot [pes planus] (acquired), unspecified foot: Secondary | ICD-10-CM

## 2016-06-05 MED ORDER — HYDROCODONE-ACETAMINOPHEN 5-325 MG PO TABS: 1.0000 | ORAL_TABLET | Freq: Three times a day (TID) | ORAL | 0 refills | Status: DC | PRN

## 2016-06-05 NOTE — Patient Instructions (Addendum)
Call [employer] for brace fitting     How can pain medicine affect me? You were given a prescription for pain medicine. This medicine may make you tired or drowsy and may affect your ability to think clearly. Pain medicine may also affect your ability to drive or perform certain physical activities. It may not be possible to make all of your pain go away, but you should be comfortable enough to move, breathe, and take care of yourself. How often should I take pain medicine and how much should I take?  Take pain medicine only as directed by your health care provider and only as needed for pain.  You do not need to take pain medicine if you are not having pain, unless directed by your health care provider.  You can take less than the prescribed dose if you find that a smaller amount of medicine controls your pain. What restrictions do I have while taking pain medicine? Follow these instructions after you start taking pain medicine, while you are taking the medicine, and for 8 hours after you stop taking the medicine:  Do not drive.  Do not operate machinery.  Do not operate power tools.  Do not sign legal documents.  Do not drink alcohol.  Do not take sleeping pills.  Do not supervise children by yourself.  Do not participate in activities that require climbing or being in high places.  Do not enter a body of water-such as a lake, river, ocean, spa, or swimming pool-without an adult nearby who can monitor and help you. How can I keep others safe while I am taking pain medicine?  Store your pain medicine as directed by your health care provider. Make sure that it is placed where children and pets cannot reach it.  Never share your pain medicine with anyone.  Do not save any leftover pills. If you have any leftover pain medicine, get rid of it or destroy it as directed by your health care provider. What else do I need to know about taking pain medicine?  Use a stool softener if you  become constipated from your pain medicine. Increasing your intake of fruits and vegetables will also help with constipation.  Write down the times when you take your pain medicine. Look at the times before you take your next dose of medicine. It is easy to become confused while on pain medicine. Recording the times helps you to avoid an overdose.  If your pain is severe, do not try to treat it yourself by taking more pills than instructed on your prescription. Contact your health care provider for help.  You may have been prescribed a pain medicine that contains acetaminophen. Do not take any other acetaminophen while taking this medicine. An overdose of acetaminophen can result in severe liver damage. Acetaminophen is found in many over-the-counter (OTC) and prescription medicines. If you are taking any medicines in addition to your pain medicine, check the active ingredients on those medicines to see if acetaminophen is listed. When should I call my health care provider?  Your medicine is not helping to make the pain go away.  You vomit or have diarrhea shortly after taking the medicine.  You develop new pain in areas that did not hurt before.  You have an allergic reaction to your medicine. This may include:  Itchiness.  Swelling.  Dizziness.  Developing a new rash. When should I call 911 or go to the emergency room?  You feel dizzy or you faint.  You are very confused or disoriented.  You repeatedly vomit.  Your skin or lips turn pale or bluish in color.  You have shortness of breath or you are breathing much more slowly than usual.  You have a severe allergic reaction to your medicine. This includes:  Developing tongue swelling.  Having difficulty breathing. This information is not intended to replace advice given to you by your health care provider. Make sure you discuss any questions you have with your health care provider. Document Released: 10/09/2000 Document  Revised: 01/21/2016 Document Reviewed: 05/07/2014 Elsevier Interactive Patient Education  2017 [employer].

## 2016-06-05 NOTE — Progress Notes (Signed)
Patient ID: Robin Sherman, female   DOB: 1961-02-07, 55 y.o.   MRN: [id number]  Chief complaint is pain in right foot   HPI Robin Sherman is a 55 y.o. female.   HPI 55 year old female with posterior tibial tendon dysfunction was scheduled to get a UCBL or adynamic AFO. She was unable to go to the appointment. She still complains of medial and lateral right foot pain  Review of Systems Review of Systems Normal neuro  Denies fever  Examination LMP 04/15/2013   Gen. appearance the patient's appearance is normal with normal grooming and  hygiene The patient is oriented to person place and time Mood and affect are normal   Ortho Exam Gait is remarkable for slightly antalgic gait Inspection reveals flexible flatfoot with tenderness in the lateral and medial sides of the foot ROM is normal Stability tests are normal  Motor exam 5/5 manual muscle testing , no atrophy  Skin is normal (no rash or erythema)    Medical decision-making Diagnosis, Data, Plan (risk)  Encounter Diagnosis  Name Primary?  Marland Kitchen PTTD (posterior tibial tendon dysfunction) Yes   Recommend UCBL or AFO  Norco 5 mg every 8 #30  Follow-up in 3 months  Arther Abbott, MD 06/05/2016 9:29 AM

## 2016-08-01 ENCOUNTER — Telehealth: Payer: Self-pay | Admitting: *Deleted

## 2016-08-01 NOTE — Telephone Encounter (Signed)
REQUEST REFILL ON HYDROCODONE 5/325

## 2016-08-01 NOTE — Telephone Encounter (Signed)
Declined

## 2016-09-05 ENCOUNTER — Ambulatory Visit: Payer: BLUE CROSS/BLUE SHIELD | Admitting: Orthopedic Surgery

## 2016-10-05 ENCOUNTER — Encounter: Payer: Self-pay | Admitting: Family Medicine

## 2016-11-16 ENCOUNTER — Encounter: Payer: Self-pay | Admitting: Family Medicine

## 2017-03-12 ENCOUNTER — Ambulatory Visit (INDEPENDENT_AMBULATORY_CARE_PROVIDER_SITE_OTHER): Payer: BLUE CROSS/BLUE SHIELD | Admitting: Family Medicine

## 2017-03-12 ENCOUNTER — Encounter: Payer: Self-pay | Admitting: Family Medicine

## 2017-03-12 VITALS — BP 138/80 | HR 92 | Temp 97.0°F | Resp 16 | Ht 65.0 in | Wt 229.0 lb

## 2017-03-12 DIAGNOSIS — Z1239 Encounter for other screening for malignant neoplasm of breast: Secondary | ICD-10-CM

## 2017-03-12 DIAGNOSIS — I10 Essential (primary) hypertension: Secondary | ICD-10-CM

## 2017-03-12 DIAGNOSIS — Z1231 Encounter for screening mammogram for malignant neoplasm of breast: Secondary | ICD-10-CM

## 2017-03-12 DIAGNOSIS — Z720 Tobacco use: Secondary | ICD-10-CM | POA: Diagnosis not present

## 2017-03-12 DIAGNOSIS — R7303 Prediabetes: Secondary | ICD-10-CM

## 2017-03-12 DIAGNOSIS — Z23 Encounter for immunization: Secondary | ICD-10-CM | POA: Diagnosis not present

## 2017-03-12 MED ORDER — HYDROCHLOROTHIAZIDE 25 MG PO TABS: 25.0000 mg | ORAL_TABLET | Freq: Every day | ORAL | 3 refills | Status: DC

## 2017-03-12 MED ORDER — CYCLOBENZAPRINE HCL 5 MG PO TABS: 5.0000 mg | ORAL_TABLET | Freq: Every day | ORAL | 3 refills | Status: DC

## 2017-03-12 MED ORDER — AMLODIPINE BESYLATE 5 MG PO TABS: 5.0000 mg | ORAL_TABLET | Freq: Every day | ORAL | 3 refills | Status: DC

## 2017-03-12 MED ORDER — HALOBETASOL PROPIONATE 0.05 % EX CREA: TOPICAL_CREAM | Freq: Two times a day (BID) | CUTANEOUS | 11 refills | Status: DC

## 2017-03-12 NOTE — Progress Notes (Signed)
Chief Complaint  Patient presents with  . Hypertension   This is a new patient to establish. She hasn't been to her doctor for some months. She is out of her medications. She is a history of well-controlled hypertension. On amlodipine and hydrochlorothiazide. She has history of borderline diabetes. Hemoglobin A1c is 6.0. Diet and exercise discussed. 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 She has a medical background. She doesn't exercise regularly because of left knee pain. She has known left knee arthritis. She is under the care of Dr. Aline Brochure in orthopedics. She takes infrequent oxycodone. She has occasional low back pain with sciatica. She is a history of eczema. She it is mostly on her hands. She uses as needed cortisone creams. I have discussed the multiple health risks associated with cigarette smoking including, but not limited to, cardiovascular disease, lung disease and cancer.  I have strongly recommended that smoking be stopped.  I have reviewed the various methods of quitting including cold [place], classes, nicotine replacements and prescription medications.  I have offered assistance in this difficult process.  The patient is not interested in assistance at this time. Her sister had breast cancer. She is overdue for mammogram. Her last Pap was in 2014. She is overdue for Pap smear. She'll come back next month for physical and Pap.   Patient Active Problem List   Diagnosis Date Noted  . Borderline diabetes 07/02/2015  . Vitamin D deficiency 07/02/2015  . Lumbar back pain 07/02/2015  . Insomnia 10/29/2013  . Arthritis of knee, degenerative 10/13/2013  . Hand dermatitis 03/02/2012  . Essential hypertension, benign 09/21/2011  . Obesity 09/21/2011  . Shoulder pain, right 09/21/2011  . Asthma, intermittent 09/21/2011  . Tobacco user 09/21/2011    Outpatient Encounter Prescriptions as of 03/12/2017  Medication Sig  . oxyCODONE-acetaminophen (PERCOCET) 10-325 MG tablet   . amLODipine  (NORVASC) 5 MG tablet Take 1 tablet (5 mg total) by mouth daily.  . cyclobenzaprine (FLEXERIL) 5 MG tablet Take 1 tablet (5 mg total) by mouth at bedtime.  . halobetasol (ULTRAVATE) 0.05 % cream Apply topically 2 (two) times daily.  . hydrochlorothiazide (HYDRODIURIL) 25 MG tablet Take 1 tablet (25 mg total) by mouth daily.   No facility-administered encounter medications on file as of 03/12/2017.     Past Medical History:  Diagnosis Date  . Allergy    pollen  . Arthritis    left knee  . Asthma   . Eczema    Followed by Dr. Nevada Crane dermatology  . Headache(784.0) 09/25/2012  . Hypertension   . Neuromuscular disorder ([place])   . Sciatica of left side 10/13/2013  . TRIGGER FINGER 11/03/2008   Qualifier: Diagnosis of  By: Aline Brochure MD, Dorothyann Peng      Past Surgical History:  Procedure Laterality Date  . ABDOMINAL HYSTERECTOMY     bleeding  . BILATERAL SALPINGECTOMY Bilateral 08/01/2013   Procedure: BILATERAL SALPINGECTOMY;  Surgeon: Jonnie Kind, MD;  Location: [facility];  Service: Gynecology;  Laterality: Bilateral;  . CHONDROPLASTY Left 01/14/2014   Procedure: CHONDROPLASTY OF FEMUR;  Surgeon: Carole Civil, MD;  Location: [facility];  Service: Orthopedics;  Laterality: Left;  . COLONOSCOPY N/A 01/27/2013   Procedure: COLONOSCOPY;  Surgeon: Danie Binder, MD;  Location: [facility];  Service: Endoscopy;  Laterality: N/A;  8:30 AM  . HEMATOMA EVACUATION N/A 08/03/2013   Procedure: EVACUATION PELVIC HEMATOMA;  Surgeon: Jonnie Kind, MD;  Location: [facility];  Service: Gynecology;  Laterality: N/A;  .  KNEE ARTHROSCOPY WITH MEDIAL MENISECTOMY Left 01/14/2014   Procedure: KNEE ARTHROSCOPY WITH PARTIAL MEDIAL MENISECTOMY;  Surgeon: Carole Civil, MD;  Location: [facility];  Service: Orthopedics;  Laterality: Left;  . SUPRACERVICAL ABDOMINAL HYSTERECTOMY N/A 08/01/2013   Procedure: HYSTERECTOMY SUPRACERVICAL ABDOMINAL;  Surgeon: Jonnie Kind, MD;  Location: [facility];  Service: Gynecology;   Laterality: N/A;  . TUBAL LIGATION     [place]    Social History   Social History  . Marital status: Married    Spouse name: [name]  . Number of children: 1  . Years of education: 30   Occupational History  . CNA    Social History Main Topics  . Smoking status: Current Every Day Smoker    Packs/day: 0.50    Years: 20.00    Types: Cigarettes    Start date: 07/18/1979  . Smokeless tobacco: Never Used     Comment: wants to quit  . Alcohol use 0.0 oz/week     Comment: Rare  . Drug use: No  . Sexual activity: Yes    Birth control/ protection: Surgical   Other Topics Concern  . Not on file   Social History Narrative   Lives at home with [name]   Visit with family and grandchildren       Family History  Problem Relation Age of Onset  . Heart disease Mother        MI  . Asthma Mother   . Hypertension Mother   . Stroke Father   . Heart disease Father        MI  . Hypertension Father   . Cancer Sister        breast  . Colon cancer Neg Hx     Review of Systems  Constitutional: Negative for chills, fever and weight loss.  HENT: Negative for congestion and hearing loss.   Eyes: Negative for blurred vision and pain.  Respiratory: Negative for cough and shortness of breath.   Cardiovascular: Negative for chest pain and leg swelling.  Gastrointestinal: Negative for abdominal pain, constipation, diarrhea and heartburn.  Genitourinary: Negative for dysuria and frequency.  Musculoskeletal: Positive for joint pain. Negative for falls and myalgias.  Neurological: Negative for dizziness, seizures and headaches.  Psychiatric/Behavioral: Negative for depression. The patient is not nervous/anxious and does not have insomnia.     BP 138/80 (BP Location: Right Arm, Patient Position: Sitting, Cuff Size: Large)   Pulse 92   Temp (!) 97 F (36.1 C) (Temporal)   Resp 16   Ht 5\' 5"  (1.651 m)   Wt 229 lb 0.6 oz (103.9 kg)   LMP 04/15/2013   SpO2 98%   BMI 38.11  kg/m   Physical Exam  Constitutional: She is oriented to person, place, and time. She appears well-developed and well-nourished.  Obese  HENT:  Head: Normocephalic and atraumatic.  Mouth/Throat: Oropharynx is clear and moist.  Eyes: Pupils are equal, round, and reactive to light. Conjunctivae are normal.  Neck: Normal range of motion. Neck supple. No thyromegaly present.  Cardiovascular: Normal rate, regular rhythm and normal heart sounds.   Pulmonary/Chest: Effort normal and breath sounds normal. No respiratory distress.  Abdominal: Soft. Bowel sounds are normal.  Musculoskeletal: Normal range of motion. She exhibits no edema.  Left knee with warmth and crepitus.  Lymphadenopathy:    She has no cervical adenopathy.  Neurological: She is alert and oriented to person, place, and time.  Gait antalgic  Skin: Skin is warm and  dry.  Psychiatric: She has a normal mood and affect. Her behavior is normal. Thought content normal.  Nursing note and vitals reviewed.   1. Need for influenza vaccination - Flu Vaccine QUAD 36+ mos IM  2. Essential hypertension, benign Well-controlled - CBC - COMPLETE METABOLIC PANEL WITH GFR - Lipid panel - VITAMIN D 25 Hydroxy (Vit-D Deficiency, Fractures) - Urinalysis, Routine w reflex microscopic  3. Tobacco user Discussed quitting. She will consider Chantix  4. Borderline diabetes Discussed diet and exercise. We'll get an A1c today  5. Screening for malignant neoplasm of breast  - MM Digital Screening; Future Greater than 50% of this visit was spent in counseling and coordinating care.  Total face to face time:   45 minutes spent discussing diagnoses, medical care, health maintenance and screening. Recommendations for diet and exercise. Recommendation immunizations  Patient Instructions  Continue to eat well and exercise as tolerated Medicines refilled Mammogram ordered Labs today Need PE in the next month   Raylene Everts, MD

## 2017-03-12 NOTE — Patient Instructions (Signed)
Continue to eat well and exercise as tolerated Medicines refilled Mammogram ordered Labs today Need PE in the next month

## 2017-03-14 LAB — CBC
HCT: 43.2 % (ref 35.0–45.0)
Hemoglobin: 14.3 g/dL (ref 11.7–15.5)
MCH: 28.4 pg (ref 27.0–33.0)
MCHC: 33.1 g/dL (ref 32.0–36.0)
MCV: 85.7 fL (ref 80.0–100.0)
MPV: 10.1 fL (ref 7.5–12.5)
PLATELETS: 374 10*3/uL (ref 140–400)
RBC: 5.04 MIL/uL (ref 3.80–5.10)
RDW: 14.5 % (ref 11.0–15.0)
WBC: 6.9 10*3/uL (ref 3.8–10.8)

## 2017-03-15 ENCOUNTER — Encounter: Payer: Self-pay | Admitting: Family Medicine

## 2017-03-15 LAB — COMPLETE METABOLIC PANEL WITH GFR
ALK PHOS: 124 U/L (ref 33–130)
ALT: 30 U/L — AB (ref 6–29)
AST: 22 U/L (ref 10–35)
Albumin: 4.4 g/dL (ref 3.6–5.1)
BILIRUBIN TOTAL: 0.4 mg/dL (ref 0.2–1.2)
BUN: 10 mg/dL (ref 7–25)
CALCIUM: 9.6 mg/dL (ref 8.6–10.4)
CO2: 20 mmol/L (ref 20–32)
Chloride: 104 mmol/L (ref 98–110)
Creat: 0.68 mg/dL (ref 0.50–1.05)
GFR, Est African American: >89 mL/min (ref >=60)
Glucose, Bld: 123 mg/dL — ABNORMAL HIGH (ref 65–99)
POTASSIUM: 4.3 mmol/L (ref 3.5–5.3)
Sodium: 140 mmol/L (ref 135–146)
TOTAL PROTEIN: 7.5 g/dL (ref 6.1–8.1)

## 2017-03-15 LAB — LIPID PANEL
CHOL/HDL RATIO: 4.4 ratio (ref <5.0)
CHOLESTEROL: 197 mg/dL (ref <200)
HDL: 45 mg/dL — ABNORMAL LOW (ref >50)
LDL Cholesterol: 133 mg/dL — ABNORMAL HIGH (ref <100)
Triglycerides: 94 mg/dL (ref <150)
VLDL: 19 mg/dL (ref <30)

## 2017-03-15 LAB — HEMOGLOBIN A1C
Hgb A1c MFr Bld: 6 % — ABNORMAL HIGH (ref <5.7)
Mean Plasma Glucose: 126 mg/dL

## 2017-03-15 LAB — VITAMIN D 25 HYDROXY (VIT D DEFICIENCY, FRACTURES): Vit D, 25-Hydroxy: 29 ng/mL — ABNORMAL LOW (ref 30–100)

## 2017-03-16 LAB — URINALYSIS, ROUTINE W REFLEX MICROSCOPIC

## 2017-04-11 ENCOUNTER — Encounter: Payer: BLUE CROSS/BLUE SHIELD | Admitting: Family Medicine

## 2017-05-08 ENCOUNTER — Encounter: Payer: BLUE CROSS/BLUE SHIELD | Admitting: Family Medicine

## 2017-05-08 DIAGNOSIS — L308 Other specified dermatitis: Secondary | ICD-10-CM | POA: Diagnosis not present

## 2017-05-28 ENCOUNTER — Encounter: Payer: BLUE CROSS/BLUE SHIELD | Admitting: Family Medicine

## 2017-06-06 ENCOUNTER — Ambulatory Visit (INDEPENDENT_AMBULATORY_CARE_PROVIDER_SITE_OTHER): Payer: BLUE CROSS/BLUE SHIELD | Admitting: Family Medicine

## 2017-06-06 ENCOUNTER — Other Ambulatory Visit (HOSPITAL_COMMUNITY)
Admission: RE | Admit: 2017-06-06 | Discharge: 2017-06-06 | Disposition: A | Discharge status: Home / Self Care | Payer: BLUE CROSS/BLUE SHIELD | Source: Ambulatory Visit | Attending: Family Medicine | Admitting: Family Medicine

## 2017-06-06 ENCOUNTER — Encounter: Payer: Self-pay | Admitting: Family Medicine

## 2017-06-06 ENCOUNTER — Other Ambulatory Visit: Payer: Self-pay

## 2017-06-06 VITALS — BP 134/88 | HR 88 | Temp 98.1°F | Resp 16 | Ht 65.0 in | Wt 230.0 lb

## 2017-06-06 DIAGNOSIS — Z0000 Encounter for general adult medical examination without abnormal findings: Secondary | ICD-10-CM

## 2017-06-06 DIAGNOSIS — Z124 Encounter for screening for malignant neoplasm of cervix: Secondary | ICD-10-CM | POA: Insufficient documentation

## 2017-06-06 DIAGNOSIS — R7303 Prediabetes: Secondary | ICD-10-CM | POA: Diagnosis not present

## 2017-06-06 NOTE — Progress Notes (Signed)
Chief Complaint  Patient presents with  . Annual Exam   Patient is here for physical examination. She is due for a Pap smear.  Her last Pap was in May 2014.  It was normal. She is overdue for mammogram.  She is reminded to go to do this Her lab tests from last visit are reviewed with her.  She has prediabetes.  She is reminded to watch the carbohydrates in her diet.  She is advised to exercise every day she is able.  She states she is limited by knee pain from arthritis.  She also works 12-hour shifts and is often too tired. Blood pressures well controlled. Her shots are up-to-date. Colonoscopy was in 2014.  Patient Active Problem List   Diagnosis Date Noted  . Borderline diabetes 07/02/2015  . Vitamin D deficiency 07/02/2015  . Lumbar back pain 07/02/2015  . Insomnia 10/29/2013  . Arthritis of knee, degenerative 10/13/2013  . Hand dermatitis 03/02/2012  . Essential hypertension, benign 09/21/2011  . Obesity 09/21/2011  . Shoulder pain, right 09/21/2011  . Asthma, intermittent 09/21/2011  . Tobacco user 09/21/2011    Outpatient Encounter Medications as of 06/06/2017  Medication Sig  . amLODipine (NORVASC) 5 MG tablet Take 1 tablet (5 mg total) by mouth daily.  . cyclobenzaprine (FLEXERIL) 5 MG tablet Take 1 tablet (5 mg total) by mouth at bedtime.  . halobetasol (ULTRAVATE) 0.05 % cream Apply topically 2 (two) times daily.  . hydrochlorothiazide (HYDRODIURIL) 25 MG tablet Take 1 tablet (25 mg total) by mouth daily.  Marland Kitchen oxyCODONE-acetaminophen (PERCOCET) 10-325 MG tablet    No facility-administered encounter medications on file as of 06/06/2017.     Allergies  Allergen Reactions  . Codeine Hives  . Latex Rash  . Naproxen Rash    Patient tolerates Ibuprofen/ poss allergy - little blisters on fingers    Review of Systems  Constitutional: Negative for activity change, appetite change and unexpected weight change.  HENT: Negative for congestion, dental problem,  postnasal drip and rhinorrhea.   Eyes: Negative for redness and visual disturbance.  Respiratory: Negative for cough and shortness of breath.   Cardiovascular: Negative for chest pain, palpitations and leg swelling.  Gastrointestinal: Negative for abdominal pain, constipation and diarrhea.  Genitourinary: Negative for difficulty urinating, frequency and vaginal bleeding.  Musculoskeletal: Positive for back pain, gait problem and joint swelling. Negative for arthralgias.  Neurological: Negative for dizziness and headaches.  Psychiatric/Behavioral: Negative for dysphoric mood and sleep disturbance. The patient is not nervous/anxious.     Physical Exam   BP 134/88 (BP Location: Left Arm, Patient Position: Sitting, Cuff Size: Large)   Pulse 88   Temp 98.1 F (36.7 C) (Temporal)   Resp 16   Ht 5\' 5"  (1.651 m)   Wt 230 lb 0.6 oz (104.3 kg)   LMP 04/15/2013   SpO2 98%   BMI 38.28 kg/m   General Appearance:    Alert, cooperative, no distress, appears stated age.  Morbid obesity  Head:    Normocephalic, without obvious abnormality, atraumatic  Eyes:    PERRL, conjunctiva/corneas clear, EOM's intact, fundi obscured by early cataracts, both eyes  Ears:    Normal TM's and external ear canals, both ears  Nose:   Nares normal, septum midline, mucosa normal, no drainage    or sinus tenderness  Throat:   Lips, mucosa, and tongue normal; teeth and gums normal  Neck:   Supple, symmetrical, trachea midline, no adenopathy;    thyroid:  no  enlargement/tenderness/nodules; no carotid   bruit   Back:     Symmetric, no curvature, ROM normal, no CVA tenderness  Lungs:     Clear to auscultation bilaterally, respirations unlabored  Chest Wall:    No tenderness or deformity   Heart:    Regular rate and rhythm, S1 and S2 normal, no murmur, rub   or gallop  Breast Exam:    No tenderness, masses, or nipple abnormality  Abdomen:     Soft, non-tender, bowel sounds active all four quadrants,    no masses, no  organomegaly  Genitalia:    Normal female without lesion,tenderness, malodorous white vaginal discharge.  Pap done.    Extremities:   Extremities normal, atraumatic, no cyanosis or edema.  Pes planus  Pulses:   2+ and symmetric all extremities  Skin:   Skin color, texture, turgor normal, no rashes or lesions  Lymph nodes:   Cervical, supraclavicular, and axillary nodes normal  Neurologic:   normal strength, sensation and reflexes    throughout     ASSESSMENT/PLAN:  1. Prediabetes Follow every 6 months - CBC - COMPLETE METABOLIC PANEL WITH GFR - Lipid panel - Urinalysis, Routine w reflex microscopic  2. Screening for cervical cancer - Cytology - PAP   Patient Instructions  Continue to watch the carbohydrates and sweets in your diet Exercise every day that you are able I will send you a letter with your test results.  If there is anything of concern, we will call right away.  Need lab tests and follow up in six months  Get your mammogram!   Raylene Everts, MD

## 2017-06-06 NOTE — Patient Instructions (Signed)
Continue to watch the carbohydrates and sweets in your diet Exercise every day that you are able I will send you a letter with your test results.  If there is anything of concern, we will call right away.  Need lab tests and follow up in six months  Get your mammogram!

## 2017-06-13 LAB — CYTOLOGY - PAP
DIAGNOSIS: NEGATIVE
HPV: DETECTED — AB

## 2017-06-19 ENCOUNTER — Telehealth: Payer: Self-pay

## 2017-06-19 ENCOUNTER — Other Ambulatory Visit: Payer: Self-pay | Admitting: Family Medicine

## 2017-06-19 MED ORDER — FLUCONAZOLE 150 MG PO TABS: 150.0000 mg | ORAL_TABLET | Freq: Once | ORAL | 0 refills | Status: AC

## 2017-06-19 NOTE — Telephone Encounter (Signed)
Pt aware of pap results, rx sent to pharmacy

## 2017-06-19 NOTE — Telephone Encounter (Signed)
-----   Message from Raylene Everts, MD sent at 06/19/2017  9:45 AM EST ----- Call Lelan Pons.  Tell her her Pap smear is negative.  She does have a yeast infection.  If she has symptoms, we should give her a dose of Diflucan.

## 2017-07-12 ENCOUNTER — Encounter: Payer: Self-pay | Admitting: Family Medicine

## 2017-07-12 ENCOUNTER — Other Ambulatory Visit: Payer: Self-pay

## 2017-07-12 ENCOUNTER — Ambulatory Visit (INDEPENDENT_AMBULATORY_CARE_PROVIDER_SITE_OTHER): Payer: BLUE CROSS/BLUE SHIELD | Admitting: Family Medicine

## 2017-07-12 VITALS — BP 154/90 | HR 92 | Temp 97.8°F | Resp 18 | Ht 65.0 in | Wt 239.1 lb

## 2017-07-12 DIAGNOSIS — I10 Essential (primary) hypertension: Secondary | ICD-10-CM | POA: Diagnosis not present

## 2017-07-12 DIAGNOSIS — J069 Acute upper respiratory infection, unspecified: Secondary | ICD-10-CM

## 2017-07-12 MED ORDER — BENZONATATE 200 MG PO CAPS: 200.0000 mg | ORAL_CAPSULE | Freq: Two times a day (BID) | ORAL | 0 refills | Status: DC | PRN

## 2017-07-12 MED ORDER — AMLODIPINE BESYLATE 10 MG PO TABS: 10.0000 mg | ORAL_TABLET | Freq: Every day | ORAL | 3 refills | Status: DC

## 2017-07-12 MED ORDER — AZITHROMYCIN 250 MG PO TABS: ORAL_TABLET | ORAL | 0 refills | Status: DC

## 2017-07-12 MED ORDER — TRAMADOL HCL 50 MG PO TABS: 50.0000 mg | ORAL_TABLET | Freq: Three times a day (TID) | ORAL | 0 refills | Status: DC | PRN

## 2017-07-12 NOTE — Progress Notes (Signed)
Chief Complaint  Patient presents with  . Hypertension   Patient is here for 2 reasons.  First she has noticed elevated blood pressure.  She had a headache and took her blood pressure.  It was 160/110.  The next day it was 150/90.  She is still having headaches.  No change in diet no change in activity although she has gained 9 pounds in the last month.  She is eating more over the holidays.  She tries not to eat salt and states she reported avoiding ham at Christmas.  We had a discussion and I am going to double her amlodipine and check her blood pressure again in a week or 2. She also has upper respiratory infection.  She has had a cough and cold for 10 or 11 days.  She has fever, she has thick sputum, she feels short of breath.  She has some sore throat and ear pain.  She feels very tired.  She does have a history of asthma.  She does not feel like she has wheezing.  She is using an albuterol inhaler intermittently. Patient has cut back on her smoking but not adequate.  I reminded her of the importance of smoking cessation given her chronic medical problems, and to improve her overall health.  Patient Active Problem List   Diagnosis Date Noted  . Borderline diabetes 07/02/2015  . Vitamin D deficiency 07/02/2015  . Lumbar back pain 07/02/2015  . Insomnia 10/29/2013  . Arthritis of knee, degenerative 10/13/2013  . Hand dermatitis 03/02/2012  . Essential hypertension, benign 09/21/2011  . Obesity 09/21/2011  . Shoulder pain, right 09/21/2011  . Asthma, intermittent 09/21/2011  . Tobacco user 09/21/2011    Outpatient Encounter Medications as of 07/12/2017  Medication Sig  . amLODipine (NORVASC) 10 MG tablet Take 1 tablet (10 mg total) by mouth daily.  . cyclobenzaprine (FLEXERIL) 5 MG tablet Take 1 tablet (5 mg total) by mouth at bedtime.  . halobetasol (ULTRAVATE) 0.05 % cream Apply topically 2 (two) times daily.  . hydrochlorothiazide (HYDRODIURIL) 25 MG tablet Take 1 tablet (25 mg  total) by mouth daily.  . [DISCONTINUED] amLODipine (NORVASC) 5 MG tablet Take 1 tablet (5 mg total) by mouth daily.  Marland Kitchen azithromycin (ZITHROMAX) 250 MG tablet tad  . benzonatate (TESSALON) 200 MG capsule Take 1 capsule (200 mg total) by mouth 2 (two) times daily as needed for cough.  . traMADol (ULTRAM) 50 MG tablet Take 1 tablet (50 mg total) by mouth every 8 (eight) hours as needed. PAIN   No facility-administered encounter medications on file as of 07/12/2017.     Allergies  Allergen Reactions  . Codeine Hives  . Latex Rash  . Naproxen Rash    Patient tolerates Ibuprofen/ poss allergy - little blisters on fingers    Review of Systems  Constitutional: Positive for fatigue, fever and unexpected weight change. Negative for activity change and appetite change.       Increased 9 pounds  HENT: Positive for congestion, postnasal drip, sinus pressure and sore throat. Negative for dental problem and rhinorrhea.   Eyes: Negative for redness and visual disturbance.  Respiratory: Positive for cough. Negative for shortness of breath and wheezing.   Cardiovascular: Negative for chest pain, palpitations and leg swelling.  Gastrointestinal: Negative for abdominal pain, constipation and diarrhea.  Genitourinary: Negative for difficulty urinating, frequency and vaginal bleeding.  Musculoskeletal: Positive for back pain, gait problem and joint swelling. Negative for arthralgias.  Chronic complaints.  Sees Dr. Aline Brochure for knee pain  Neurological: Positive for headaches. Negative for dizziness.  Psychiatric/Behavioral: Negative for dysphoric mood and sleep disturbance. The patient is not nervous/anxious.     BP (!) 154/90 (BP Location: Right Arm, Patient Position: Supine, Cuff Size: Large)   Pulse 92   Temp 97.8 F (36.6 C) (Temporal)   Resp 18   Ht 5\' 5"  (1.651 m)   Wt 239 lb 1.9 oz (108.5 kg)   LMP 04/15/2013   SpO2 98%   BMI 39.79 kg/m   Physical Exam  Constitutional: She is  oriented to person, place, and time. She appears well-developed and well-nourished.  Obese  HENT:  Head: Normocephalic and atraumatic.  Right Ear: External ear normal.  Left Ear: External ear normal.  Mouth/Throat: Oropharynx is clear and moist. No oropharyngeal exudate.  Posterior pharynx is injected  Eyes: Conjunctivae are normal. Pupils are equal, round, and reactive to light.  Neck: Normal range of motion. Neck supple. No thyromegaly present.  Cardiovascular: Normal rate, regular rhythm and normal heart sounds.  Pulmonary/Chest: Effort normal. No respiratory distress. She has wheezes.  Scattered anterior wheeze  Abdominal: Soft. Bowel sounds are normal.  Musculoskeletal: Normal range of motion. She exhibits no edema.  Lymphadenopathy:    She has no cervical adenopathy.  Neurological: She is alert and oriented to person, place, and time.  Gait antalgic  Skin: Skin is warm and dry.  Psychiatric: She has a normal mood and affect. Her behavior is normal. Thought content normal.  Nursing note and vitals reviewed.   ASSESSMENT/PLAN:  1. Essential hypertension, benign Not well controlled.  I am going to increase her Norvasc and check her next week  2. URI with cough and congestion Persistent symptoms.  Underlying asthma and cigarette smoking.  We will treat her with an antibiotic.  Discussed CDC guidelines.  Prescription for Tessalon for cough   Patient Instructions  Rest Push fluids Take the tessalon for cough Take the z pak as directed Call if not better in a few days  Come back in a week or two for BP check with nurse  Refill tramadol for pain   Raylene Everts, MD

## 2017-07-12 NOTE — Patient Instructions (Signed)
Rest Push fluids Take the tessalon for cough Take the z pak as directed Call if not better in a few days  Come back in a week or two for BP check with nurse  Refill tramadol for pain

## 2017-09-05 DIAGNOSIS — L308 Other specified dermatitis: Secondary | ICD-10-CM | POA: Diagnosis not present

## 2017-09-24 ENCOUNTER — Encounter: Payer: Self-pay | Admitting: Family Medicine

## 2017-10-05 ENCOUNTER — Other Ambulatory Visit: Payer: Self-pay

## 2017-10-05 ENCOUNTER — Encounter: Payer: Self-pay | Admitting: Family Medicine

## 2017-10-05 ENCOUNTER — Ambulatory Visit (INDEPENDENT_AMBULATORY_CARE_PROVIDER_SITE_OTHER): Payer: BLUE CROSS/BLUE SHIELD | Admitting: Family Medicine

## 2017-10-05 VITALS — BP 144/84 | HR 95 | Temp 97.8°F | Resp 16 | Ht 64.0 in | Wt 238.8 lb

## 2017-10-05 DIAGNOSIS — I10 Essential (primary) hypertension: Secondary | ICD-10-CM

## 2017-10-05 DIAGNOSIS — R6889 Other general symptoms and signs: Secondary | ICD-10-CM | POA: Diagnosis not present

## 2017-10-05 LAB — POCT INFLUENZA A/B
INFLUENZA B, POC: NEGATIVE
Influenza A, POC: NEGATIVE

## 2017-10-05 MED ORDER — PROMETHAZINE-DM 6.25-15 MG/5ML PO SYRP: 5.0000 mL | ORAL_SOLUTION | Freq: Four times a day (QID) | ORAL | 0 refills | Status: DC | PRN

## 2017-10-05 MED ORDER — TELMISARTAN 40 MG PO TABS: 40.0000 mg | ORAL_TABLET | Freq: Every day | ORAL | 1 refills | Status: DC

## 2017-10-05 MED ORDER — DOXYCYCLINE HYCLATE 100 MG PO TABS: 100.0000 mg | ORAL_TABLET | Freq: Two times a day (BID) | ORAL | 0 refills | Status: DC

## 2017-10-05 MED ORDER — PREDNISONE 20 MG PO TABS: 20.0000 mg | ORAL_TABLET | Freq: Two times a day (BID) | ORAL | 0 refills | Status: DC

## 2017-10-05 NOTE — Progress Notes (Signed)
Chief Complaint  Patient presents with  . Cough    Saturday  . Generalized Body Aches   Patient is here for cough and cold started on Saturday.  She states she had a fairly  sudden onset of cough, body aches, sweats and chills.  She states that she had some sinus drainage, sore throat, headache.  She felt very tired.  She rested over the weekend and then tried to go to work this week.  She is managed for her couple days but has a very harsh cough that is difficult to control.  No sore throat.  Sinus drainage is somewhat better.  Still very tired.  No more sweats or chills.  She did get a flu shot.  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  She brings with her both of her blood pressure medications.  She was unable to take lisinopril because of chronic cough.  She does not want to take hydrochlorothiazide anymore because she states is making her hair fall out.  She does not want to take amlodipine anymore because it is making her ankle swollen.  Her blood pressures been going up and down.  I believe she takes her medicine variably.  Will be to change her to an ARB and have her take Micardis once a day.  We can adjust this up or down depending on her response.  She will continue to follow her blood pressure, and come in in a week for blood pressure check.  Patient Active Problem List   Diagnosis Date Noted  . Borderline diabetes 07/02/2015  . Vitamin D deficiency 07/02/2015  . Lumbar back pain 07/02/2015  . Insomnia 10/29/2013  . Arthritis of knee, degenerative 10/13/2013  . Hand dermatitis 03/02/2012  . Essential hypertension, benign 09/21/2011  . Obesity 09/21/2011  . Shoulder pain, right 09/21/2011  . Asthma, intermittent 09/21/2011  . Tobacco user 09/21/2011    Outpatient Encounter Medications as of 10/05/2017  Medication Sig  . amLODipine (NORVASC) 10 MG tablet Take 1 tablet (10 mg total) by mouth daily.  . cyclobenzaprine (FLEXERIL) 5 MG tablet  Take 1 tablet (5 mg total) by mouth at bedtime.  . halobetasol (ULTRAVATE) 0.05 % cream Apply topically 2 (two) times daily.  . hydrochlorothiazide (HYDRODIURIL) 25 MG tablet Take 1 tablet (25 mg total) by mouth daily.  . traMADol (ULTRAM) 50 MG tablet Take 1 tablet (50 mg total) by mouth every 8 (eight) hours as needed. PAIN  . [DISCONTINUED] azithromycin (ZITHROMAX) 250 MG tablet tad  . [DISCONTINUED] benzonatate (TESSALON) 200 MG capsule Take 1 capsule (200 mg total) by mouth 2 (two) times daily as needed for cough.  . doxycycline (VIBRA-TABS) 100 MG tablet Take 1 tablet (100 mg total) by mouth 2 (two) times daily.  . predniSONE (DELTASONE) 20 MG tablet Take 1 tablet (20 mg total) by mouth 2 (two) times daily with a meal.  . promethazine-dextromethorphan (PROMETHAZINE-DM) 6.25-15 MG/5ML syrup Take 5 mLs by mouth 4 (four) times daily as needed for cough.  . telmisartan (MICARDIS) 40 MG tablet Take 1 tablet (40 mg total) by mouth daily.  . [DISCONTINUED] lisinopril-hydrochlorothiazide (PRINZIDE,ZESTORETIC) 20-12.5 MG per tablet Take 1 tablet by mouth daily.     No facility-administered encounter medications on file as of 10/05/2017.     Allergies  Allergen Reactions  . Codeine Hives  . Latex Rash  . Naproxen Rash    Patient tolerates Ibuprofen/ poss allergy - little blisters on fingers  Review of Systems  Constitutional: Positive for chills and fatigue. Negative for activity change, appetite change and unexpected weight change.       Chills resolved, still fatigued  HENT: Positive for postnasal drip and sinus pressure. Negative for congestion, dental problem and rhinorrhea.   Eyes: Negative for redness and visual disturbance.  Respiratory: Positive for cough and shortness of breath.   Cardiovascular: Negative for chest pain, palpitations and leg swelling.  Gastrointestinal: Negative for diarrhea, nausea and vomiting.  Genitourinary: Negative for difficulty urinating, frequency and  menstrual problem.  Musculoskeletal: Negative for arthralgias and back pain.  Neurological: Positive for headaches. Negative for dizziness.  Psychiatric/Behavioral: Negative for dysphoric mood and sleep disturbance. The patient is not nervous/anxious.     BP (!) 144/84 (BP Location: Left Arm, Patient Position: Sitting, Cuff Size: Normal)   Pulse 95   Temp 97.8 F (36.6 C) (Temporal)   Resp 16   Ht 5\' 4"  (1.626 m)   Wt 238 lb 12 oz (108.3 kg)   LMP 04/15/2013   SpO2 97%   BMI 40.98 kg/m   Physical Exam  Constitutional: She is oriented to person, place, and time. She appears well-developed and well-nourished.  Appears tired, moderately ill  HENT:  Head: Normocephalic and atraumatic.  Right Ear: External ear normal.  Left Ear: External ear normal.  Mouth/Throat: Oropharynx is clear and moist.  Throat mildly red.  No sinus tenderness  Eyes: Pupils are equal, round, and reactive to light. Conjunctivae are normal.  Neck: Normal range of motion. Neck supple. No thyromegaly present.  Cardiovascular: Normal rate, regular rhythm and normal heart sounds.  Pulmonary/Chest: Effort normal. No respiratory distress. She has wheezes.  Harsh cough.  Scattered wheeze throughout.  Abdominal: Soft. Bowel sounds are normal.  Musculoskeletal: Normal range of motion. She exhibits no edema.  Lymphadenopathy:    She has no cervical adenopathy.  Neurological: She is alert and oriented to person, place, and time.  Gait normal  Skin: Skin is warm and dry.  Psychiatric: She has a normal mood and affect. Her behavior is normal. Thought content normal.  Nursing note and vitals reviewed.   ASSESSMENT/PLAN:  1. Flu-like symptoms This likely started off a virus.  She is having persistent symptoms.  With the wheezing I am going to give her 5 days of prednisone and antibiotics.  She wants a stronger cough medicine.  She is allergic to codeine.  She states Tessalon does not help. - POCT Influenza A/B  negative 2.  Essential hypertension As per above discussion and will stop both of her medications and start her on an ARB.  She has not tolerated thiazide, lisinopril, or Norvasc.  Patient Instructions  Push fluids Take the antibiotic 2 x a day Take the prednisone 2 x a day Use the cough medicine as directed  Call if not improving by next week  Stop BOTH BP medicines Start the micardis daily Once a day  Come in for a BP check one day next week   Raylene Everts, MD

## 2017-10-05 NOTE — Patient Instructions (Addendum)
Push fluids Take the antibiotic 2 x a day Take the prednisone 2 x a day Use the cough medicine as directed  Call if not improving by next week  Stop BOTH BP medicines Start the micardis daily Once a day  Come in for a BP check one day next week

## 2017-10-11 ENCOUNTER — Ambulatory Visit: Payer: BLUE CROSS/BLUE SHIELD

## 2017-10-11 VITALS — BP 136/78

## 2017-10-11 DIAGNOSIS — I10 Essential (primary) hypertension: Secondary | ICD-10-CM

## 2017-12-04 ENCOUNTER — Ambulatory Visit: Payer: BLUE CROSS/BLUE SHIELD | Admitting: Family Medicine

## 2017-12-19 ENCOUNTER — Encounter: Payer: Self-pay | Admitting: Gastroenterology

## 2018-01-01 ENCOUNTER — Other Ambulatory Visit: Payer: Self-pay | Admitting: Family Medicine

## 2018-09-27 ENCOUNTER — Ambulatory Visit (INDEPENDENT_AMBULATORY_CARE_PROVIDER_SITE_OTHER): Payer: BLUE CROSS/BLUE SHIELD

## 2018-09-27 ENCOUNTER — Encounter: Payer: Self-pay | Admitting: Orthopedic Surgery

## 2018-09-27 ENCOUNTER — Other Ambulatory Visit: Payer: Self-pay

## 2018-09-27 ENCOUNTER — Ambulatory Visit: Payer: BLUE CROSS/BLUE SHIELD | Admitting: Orthopedic Surgery

## 2018-09-27 VITALS — BP 153/105 | HR 81 | Ht 64.0 in | Wt 232.0 lb

## 2018-09-27 DIAGNOSIS — M25561 Pain in right knee: Secondary | ICD-10-CM

## 2018-09-27 DIAGNOSIS — M5440 Lumbago with sciatica, unspecified side: Secondary | ICD-10-CM

## 2018-09-27 MED ORDER — MELOXICAM 7.5 MG PO TABS
7.5000 mg | ORAL_TABLET | Freq: Every day | ORAL | 0 refills | Status: DC
Start: 2018-09-27 — End: 2019-02-21

## 2018-09-27 MED ORDER — GABAPENTIN 100 MG PO CAPS: 100.0000 mg | ORAL_CAPSULE | Freq: Three times a day (TID) | ORAL | 2 refills | Status: DC

## 2018-09-27 NOTE — Patient Instructions (Signed)
You have received an injection of steroids into the joint. 15% of patients will have increased pain within the 24 hours postinjection.   This is transient and will go away.   We recommend that you use ice packs on the injection site for 20 minutes every 2 hours and extra strength Tylenol 2 tablets every 8 as needed until the pain resolves.  If you continue to have pain after taking the Tylenol and using the ice please call the office for further instructions.  Start medication for back pain

## 2018-09-27 NOTE — Progress Notes (Signed)
ESTABLISHED PATIENT NEW PROBLEM OFFICE VISIT  Chief Complaint  Patient presents with  . Back Pain    right leg painful     58 yo female presents with 8-month history of pain in her right side starting in her right knee radiates to her back with history of 8 out of 10 dull aching pain unrelieved by 3 months of ibuprofen  Patient denies any trauma.  She has medial joint line pain on the right knee which is exacerbated by turning the leg and knee into external rotation   Review of Systems  Constitutional: Negative for malaise/fatigue and weight loss.  Gastrointestinal: Negative.   Genitourinary: Negative.   Neurological: Positive for tingling.       Right foot lateral and plantar     Past Medical History:  Diagnosis Date  . Allergy    pollen  . Arthritis    left knee  . Asthma   . Eczema    Followed by Dr. Nevada Crane dermatology  . Headache(784.0) 09/25/2012  . Hypertension   . Neuromuscular disorder ([place])   . Sciatica of left side 10/13/2013  . TRIGGER FINGER 11/03/2008   Qualifier: Diagnosis of  By: Aline Brochure MD, Dorothyann Peng      Past Surgical History:  Procedure Laterality Date  . ABDOMINAL HYSTERECTOMY     bleeding  . BILATERAL SALPINGECTOMY Bilateral 08/01/2013   Procedure: BILATERAL SALPINGECTOMY;  Surgeon: Jonnie Kind, MD;  Location: [facility];  Service: Gynecology;  Laterality: Bilateral;  . CHONDROPLASTY Left 01/14/2014   Procedure: CHONDROPLASTY OF FEMUR;  Surgeon: Carole Civil, MD;  Location: [facility];  Service: Orthopedics;  Laterality: Left;  . COLONOSCOPY N/A 01/27/2013   Procedure: COLONOSCOPY;  Surgeon: Danie Binder, MD;  Location: [facility];  Service: Endoscopy;  Laterality: N/A;  8:30 AM  . HEMATOMA EVACUATION N/A 08/03/2013   Procedure: EVACUATION PELVIC HEMATOMA;  Surgeon: Jonnie Kind, MD;  Location: [facility];  Service: Gynecology;  Laterality: N/A;  . KNEE ARTHROSCOPY WITH MEDIAL MENISECTOMY Left 01/14/2014   Procedure: KNEE ARTHROSCOPY WITH PARTIAL  MEDIAL MENISECTOMY;  Surgeon: Carole Civil, MD;  Location: [facility];  Service: Orthopedics;  Laterality: Left;  . SUPRACERVICAL ABDOMINAL HYSTERECTOMY N/A 08/01/2013   Procedure: HYSTERECTOMY SUPRACERVICAL ABDOMINAL;  Surgeon: Jonnie Kind, MD;  Location: [facility];  Service: Gynecology;  Laterality: N/A;  . TUBAL LIGATION     [facility]    Family History  Problem Relation Age of Onset  . Heart disease Mother        MI  . Asthma Mother   . Hypertension Mother   . Stroke Father   . Heart disease Father        MI  . Hypertension Father   . Cancer Sister        breast  . Colon cancer Neg Hx    Social History   Tobacco Use  . Smoking status: Current Every Day Smoker    Packs/day: 0.50    Years: 20.00    Pack years: 10.00    Types: Cigarettes    Start date: 07/18/1979  . Smokeless tobacco: Never Used  . Tobacco comment: wants to quit  Substance Use Topics  . Alcohol use: Yes    Alcohol/week: 0.0 standard drinks    Comment: Rare  . Drug use: No    Allergies  Allergen Reactions  . Codeine Hives  . Latex Rash  . Naproxen Rash    Patient tolerates Ibuprofen/ poss allergy - little  blisters on fingers    No outpatient medications have been marked as taking for the 09/27/18 encounter (Office Visit) with Carole Civil, MD.    BP (!) 153/105   Pulse 81   Ht 5\' 4"  (1.626 m)   Wt 232 lb (105.2 kg)   LMP 04/15/2013   BMI 39.82 kg/m   Physical Exam Constitutional:      General: She is not in acute distress.    Appearance: She is obese. She is not ill-appearing, toxic-appearing or diaphoretic.  Neurological:     Mental Status: She is alert and oriented to person, place, and time.     Sensory: Sensory deficit present.     Coordination: Coordination normal.     Gait: Gait normal.     Deep Tendon Reflexes: Reflexes normal.  Psychiatric:        Mood and Affect: Mood normal.        Behavior: Behavior normal.        Thought Content: Thought content normal.         Judgment: Judgment normal.     Ortho Exam  Left lower extremity normal alignment no range of motion deficits knee joint hip joint stable muscle tone strength normal skin no lesions palpated or visualized  Right knee mild varus alignment tenderness medial joint line range of motion 125 degrees flexion, muscle tone and strength normal.  Knee stable.  Skin normal.  The patient has decreased sensation on the right foot lateral and plantar aspect  No peripheral edema on either leg  Straight leg raise positive on the right at 30 degrees negative on the left  Spinal tenderness in the lumbar spine on the midline and right side negative on the left side no thoracic or cervical spine tenderness   Images  MEDICAL DECISION SECTION  Xrays were done at [facility] orthopedics  My independent reading of xrays:  Right knee x-ray shows mild arthritis symmetric joint space narrowing without secondary bone abnormality  Lumbar spine shows facet arthritis with L3-4 spondylolisthesis anteriorly.  Encounter Diagnoses  Name Primary?  . Low back pain with sciatica, sciatica laterality unspecified, unspecified back pain laterality, unspecified chronicity Yes  . Right knee pain, unspecified chronicity     PLAN:  I injected her right knee and we will start some medications for lumbar spine pain.  Procedure note right knee injection verbal consent was obtained to inject right knee joint  Timeout was completed to confirm the site of injection  The medications used were 40 mg of Depo-Medrol and 1% lidocaine 3 cc  Anesthesia was provided by ethyl chloride and the skin was prepped with alcohol.  After cleaning the skin with alcohol a 20-gauge needle was used to inject the right knee joint. There were no complications. A sterile bandage was applied.  Patient will arrange 6-week follow-up  Meds ordered this encounter  Medications  . gabapentin (NEURONTIN) 100 MG capsule    Sig: Take 1 capsule  (100 mg total) by mouth 3 (three) times daily.    Dispense:  90 capsule    Refill:  2  . meloxicam (MOBIC) 7.5 MG tablet    Sig: Take 1 tablet (7.5 mg total) by mouth daily.    Dispense:  42 tablet    Refill:  0    Arther Abbott, MD  09/27/2018 9:25 AM

## 2018-09-28 ENCOUNTER — Encounter (HOSPITAL_COMMUNITY): Payer: Self-pay | Admitting: Emergency Medicine

## 2018-09-28 ENCOUNTER — Other Ambulatory Visit: Payer: Self-pay

## 2018-09-28 ENCOUNTER — Emergency Department (HOSPITAL_COMMUNITY)
Admission: EM | Admit: 2018-09-28 | Discharge: 2018-09-28 | Disposition: A | Discharge status: Home / Self Care | Payer: BLUE CROSS/BLUE SHIELD | Attending: Emergency Medicine | Admitting: Emergency Medicine

## 2018-09-28 DIAGNOSIS — I10 Essential (primary) hypertension: Secondary | ICD-10-CM | POA: Diagnosis not present

## 2018-09-28 DIAGNOSIS — Z79899 Other long term (current) drug therapy: Secondary | ICD-10-CM | POA: Diagnosis not present

## 2018-09-28 DIAGNOSIS — Z041 Encounter for examination and observation following transport accident: Secondary | ICD-10-CM | POA: Insufficient documentation

## 2018-09-28 DIAGNOSIS — J45909 Unspecified asthma, uncomplicated: Secondary | ICD-10-CM | POA: Insufficient documentation

## 2018-09-28 DIAGNOSIS — S199XXA Unspecified injury of neck, initial encounter: Secondary | ICD-10-CM | POA: Diagnosis not present

## 2018-09-28 DIAGNOSIS — S0990XA Unspecified injury of head, initial encounter: Secondary | ICD-10-CM | POA: Diagnosis not present

## 2018-09-28 DIAGNOSIS — F17210 Nicotine dependence, cigarettes, uncomplicated: Secondary | ICD-10-CM | POA: Insufficient documentation

## 2018-09-28 MED ORDER — METHOCARBAMOL 500 MG PO TABS: 500.0000 mg | ORAL_TABLET | Freq: Two times a day (BID) | ORAL | 0 refills | Status: DC

## 2018-09-28 MED ORDER — AMLODIPINE BESYLATE 10 MG PO TABS: 10.0000 mg | ORAL_TABLET | Freq: Every day | ORAL | 3 refills | Status: DC

## 2018-09-28 MED ORDER — HYDROCHLOROTHIAZIDE 25 MG PO TABS: 25.0000 mg | ORAL_TABLET | Freq: Every day | ORAL | 3 refills | Status: DC
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
## 2018-09-28 NOTE — Discharge Instructions (Signed)
Return if any problems.

## 2018-09-28 NOTE — ED Notes (Signed)
ED Provider at bedside. 

## 2018-10-02 ENCOUNTER — Ambulatory Visit (HOSPITAL_COMMUNITY)
Admission: RE | Admit: 2018-10-02 | Discharge: 2018-10-02 | Disposition: A | Discharge status: Home / Self Care | Payer: BLUE CROSS/BLUE SHIELD | Source: Ambulatory Visit | Attending: Internal Medicine | Admitting: Internal Medicine

## 2018-10-02 ENCOUNTER — Other Ambulatory Visit (HOSPITAL_COMMUNITY): Payer: Self-pay | Admitting: Internal Medicine

## 2018-10-02 ENCOUNTER — Other Ambulatory Visit: Payer: Self-pay

## 2018-10-02 ENCOUNTER — Other Ambulatory Visit (HOSPITAL_COMMUNITY): Payer: Self-pay | Admitting: Adult Health Nurse Practitioner

## 2018-10-02 DIAGNOSIS — M546 Pain in thoracic spine: Secondary | ICD-10-CM | POA: Diagnosis not present

## 2018-10-02 DIAGNOSIS — Z0189 Encounter for other specified special examinations: Secondary | ICD-10-CM | POA: Diagnosis not present

## 2018-10-02 DIAGNOSIS — M47812 Spondylosis without myelopathy or radiculopathy, cervical region: Secondary | ICD-10-CM | POA: Diagnosis not present

## 2018-10-02 DIAGNOSIS — M542 Cervicalgia: Secondary | ICD-10-CM | POA: Diagnosis not present

## 2018-10-15 DIAGNOSIS — Z6839 Body mass index (BMI) 39.0-39.9, adult: Secondary | ICD-10-CM | POA: Diagnosis not present

## 2018-10-15 DIAGNOSIS — I10 Essential (primary) hypertension: Secondary | ICD-10-CM | POA: Diagnosis not present

## 2018-10-18 DIAGNOSIS — E782 Mixed hyperlipidemia: Secondary | ICD-10-CM | POA: Diagnosis not present

## 2018-10-18 DIAGNOSIS — M1711 Unilateral primary osteoarthritis, right knee: Secondary | ICD-10-CM | POA: Diagnosis not present

## 2018-10-18 DIAGNOSIS — R945 Abnormal results of liver function studies: Secondary | ICD-10-CM | POA: Diagnosis not present

## 2018-10-18 DIAGNOSIS — I10 Essential (primary) hypertension: Secondary | ICD-10-CM | POA: Diagnosis not present

## 2018-11-04 ENCOUNTER — Telehealth: Payer: Self-pay | Admitting: Orthopedic Surgery

## 2018-11-04 NOTE — Telephone Encounter (Signed)
Robin Sherman called this morning stating she wanted to come in to get an injection in her right knee.  She was seen last on 09/27/18 and given an injection at that time to her right knee.  She is now scheduled for a virtual appointment next Monday, 11/11/18.  She doesn't want to wait until next week for virtual appointment.  She wants to come in this week for injection.  I told her that I would have to speak to Dr. Aline Brochure to see what can be done.  May I put her on the schedule for either Tuesday or Wednesday of this week for an injection?  Please let me know  Thanks

## 2018-11-05 NOTE — Telephone Encounter (Signed)
No, but Friday is ok

## 2018-11-08 ENCOUNTER — Other Ambulatory Visit: Payer: Self-pay

## 2018-11-08 ENCOUNTER — Encounter: Payer: Self-pay | Admitting: Orthopedic Surgery

## 2018-11-08 ENCOUNTER — Ambulatory Visit: Payer: BLUE CROSS/BLUE SHIELD | Admitting: Orthopedic Surgery

## 2018-11-08 VITALS — BP 150/101 | HR 98 | Temp 98.3°F | Ht 64.0 in | Wt 232.0 lb

## 2018-11-08 DIAGNOSIS — M25561 Pain in right knee: Secondary | ICD-10-CM

## 2018-11-08 DIAGNOSIS — M5440 Lumbago with sciatica, unspecified side: Secondary | ICD-10-CM

## 2018-11-08 NOTE — Patient Instructions (Signed)
OOW X 2 WEEKS   FU IN 2 WEEKS

## 2018-11-08 NOTE — Progress Notes (Signed)
Chief Complaint  Patient presents with  . Leg Pain    right thigh and medial right knee painful   . Other    needs work note    Progress Note   Patient ID: Robin Sherman, female   DOB: 1960/09/30, 58 y.o.   MRN: [id number]   Chief Complaint  Patient presents with  . Leg Pain    right thigh and medial right knee painful   . Other    needs work note     58 year old female was sent home from work with right knee pain difficult to stand.  Currently on Flexeril gabapentin and Mobic.  Previously seen thought to have arthritis of the knee but not significant enough for knee replacement surgery  She complains of pain behind her knee and then over the Pez tendons and across the front of the knee  Requested second injection      Review of Systems  Musculoskeletal: Positive for back pain and joint pain.     Allergies  Allergen Reactions  . Codeine Hives  . Latex Rash  . Naproxen Rash    Patient tolerates Ibuprofen/ poss allergy - little blisters on fingers     BP (!) 150/101   Pulse 98   Temp 98.3 F (36.8 C)   Ht 5\' 4"  (1.626 m)   Wt 232 lb (105.2 kg)   LMP 04/15/2013   BMI 39.82 kg/m   Physical Exam Vitals signs and nursing note reviewed.  Constitutional:      Appearance: Normal appearance.  Musculoskeletal:       Arms:       Legs:  Neurological:     Mental Status: She is alert and oriented to person, place, and time.     Gait: Gait abnormal.     Comments: Came in with a wheelchair  Psychiatric:        Mood and Affect: Mood normal.      Medical decisions:   Data  Imaging:   Imaging of the spine shows disc disease but fairly significant  Encounter Diagnoses  Name Primary?  . Right knee pain, unspecified chronicity Yes  . Low back pain with sciatica, sciatica laterality unspecified, unspecified back pain laterality, unspecified chronicity     PLAN:   Injection given  X-ray in 2 weeks  Out of work 2 weeks    Arther Abbott, MD  11/08/2018 9:24 AM  Procedure note right knee injection verbal consent was obtained to inject right knee joint  Timeout was completed to confirm the site of injection  The medications used were 40 mg of Depo-Medrol and 1% lidocaine 3 cc  Anesthesia was provided by ethyl chloride and the skin was prepped with alcohol.  After cleaning the skin with alcohol a 20-gauge needle was used to inject the right knee joint. There were no complications. A sterile bandage was applied.   Seeing chiropractor   OOW X 2 WEEKS   FU 2 WEEKS IN PERSON   Encounter Diagnoses  Name Primary?  . Right knee pain, unspecified chronicity Yes  . Low back pain with sciatica, sciatica laterality unspecified, unspecified back pain laterality, unspecified chronicity

## 2018-11-11 ENCOUNTER — Ambulatory Visit: Payer: BLUE CROSS/BLUE SHIELD | Admitting: Orthopedic Surgery

## 2018-11-22 ENCOUNTER — Telehealth: Payer: Self-pay | Admitting: Radiology

## 2018-11-22 ENCOUNTER — Ambulatory Visit: Payer: BLUE CROSS/BLUE SHIELD | Admitting: Orthopedic Surgery

## 2018-11-22 ENCOUNTER — Encounter: Payer: Self-pay | Admitting: Orthopedic Surgery

## 2018-11-22 ENCOUNTER — Other Ambulatory Visit: Payer: Self-pay

## 2018-11-22 VITALS — BP 155/105 | HR 108 | Temp 97.4°F | Ht 64.0 in | Wt 232.0 lb

## 2018-11-22 DIAGNOSIS — M23321 Other meniscus derangements, posterior horn of medial meniscus, right knee: Secondary | ICD-10-CM

## 2018-11-22 DIAGNOSIS — M25561 Pain in right knee: Secondary | ICD-10-CM

## 2018-11-22 DIAGNOSIS — G8929 Other chronic pain: Secondary | ICD-10-CM

## 2018-11-22 MED ORDER — PREDNISONE 10 MG (48) PO TBPK: ORAL_TABLET | Freq: Every day | ORAL | 0 refills | Status: DC

## 2018-11-22 MED ORDER — PREDNISONE 10 MG PO TABS: 20.0000 mg | ORAL_TABLET | Freq: Three times a day (TID) | ORAL | 0 refills | Status: DC

## 2018-11-22 NOTE — Telephone Encounter (Signed)
Patient seen by Dr Aline Brochure 11/22/2018

## 2018-11-22 NOTE — Telephone Encounter (Signed)
Pharmacy called and asked for clarification on Rx.  Prednisone 10 mg dosepak was sent to pharm, and directions read take 20 mg TID.  Dorothea Ogle is asking for someone to call to clarify please.

## 2018-11-22 NOTE — Telephone Encounter (Signed)
Should this be a taper ? Take as directed Or Prednisone  10 mg tid Please advise.

## 2018-11-22 NOTE — Patient Instructions (Addendum)
STOP MELOXICAM  START PREDNISONE 20 MG 3 X A DAY   OOW April 24 TO June 8

## 2018-11-22 NOTE — Progress Notes (Signed)
Chief Complaint  Patient presents with  . Knee Pain    right knee pain    58 year old female status post 2 injections right knee presents with painful weightbearing inability to walk catching locking giving way right knee now requiring a walker to ambulate   Previously had x-rays showed mild arthritis of the right knee   Other previous and treatment includes NSAID therapy for greater than 6 weeks   Review of Systems  Musculoskeletal: Positive for back pain.  Neurological: Negative for tingling.    Past Medical History:  Diagnosis Date  . Allergy    pollen  . Arthritis    left knee  . Asthma   . Eczema    Followed by Dr. Nevada Crane dermatology  . Headache(784.0) 09/25/2012  . Hypertension   . Neuromuscular disorder ([place])   . Sciatica of left side 10/13/2013  . TRIGGER FINGER 11/03/2008   Qualifier: Diagnosis of  By: Aline Brochure MD, [place]       BP (!) 155/105   Pulse (!) 108   Temp (!) 97.4 F (36.3 C)   Ht 5\' 4"  (1.626 m)   Wt 232 lb (105.2 kg)   LMP 04/15/2013   BMI 39.82 kg/m   normal development nutrition normal grooming no gross deformities  Peripheral vascular system no swelling or varicose veins temperature normal no edema  Groin no lymphadenopathy.  Gait and station supported by walker patient limping  Right knee tender over the medial joint line and Pez bursa also across the patella range of motion is 125 degrees ligaments are stable in all planes muscle strength and tone are normal skin is intact with no rash sensation is normal she is oriented x3 mood and affect are also normal MRI scan right knee    Encounter Diagnoses  Name Primary?  . Chronic pain of right knee Yes  . Derangement of posterior horn of medial meniscus of right knee     Recommend MRI scan right knee  Probably will need surgery after confirmation   OOW FROM 24 April + 6 WEEKS    Meds ordered this encounter  Medications  . predniSONE (STERAPRED UNI-PAK 48 TAB) 10 MG (48) TBPK  tablet    Sig: Take by mouth daily. 20 MG 3 X A DAY    Dispense:  48 tablet    Refill:  0

## 2018-11-22 NOTE — Telephone Encounter (Signed)
Not a taper, is prednisone 20 mg tid for 7 days I have resent, tried calling pharmacy could not get through

## 2018-11-22 NOTE — Addendum Note (Signed)
Addended byCandice Camp on: 11/22/2018 11:38 AM   Modules accepted: Orders

## 2018-11-26 ENCOUNTER — Telehealth: Payer: Self-pay | Admitting: Radiology

## 2018-11-26 NOTE — Telephone Encounter (Signed)
Called patient with appointment information.

## 2018-11-26 NOTE — Telephone Encounter (Signed)
It is [facility], I went online to get approval with [employer] , and I have to call, will schedule after approved.   There are limited phone operators working according to their message I called and have gotten automated service. The members contract is active. In network deductible has been met.   Recording states go through [employer] will look at [employer] again.

## 2018-11-26 NOTE — Telephone Encounter (Signed)
Scheduled for May 14th at [facility] arrive at 11:30, at [facility].

## 2018-11-26 NOTE — Telephone Encounter (Signed)
Patient called, wants to know when her MRI will be scheduled for. Please call her.

## 2018-11-28 ENCOUNTER — Other Ambulatory Visit: Payer: Self-pay

## 2018-11-28 ENCOUNTER — Ambulatory Visit (HOSPITAL_COMMUNITY)
Admission: RE | Admit: 2018-11-28 | Discharge: 2018-11-28 | Disposition: A | Discharge status: Home / Self Care | Payer: BLUE CROSS/BLUE SHIELD | Source: Ambulatory Visit | Attending: Orthopedic Surgery | Admitting: Orthopedic Surgery

## 2018-11-28 DIAGNOSIS — G8929 Other chronic pain: Secondary | ICD-10-CM | POA: Insufficient documentation

## 2018-11-28 DIAGNOSIS — M25561 Pain in right knee: Secondary | ICD-10-CM | POA: Diagnosis not present

## 2018-12-03 ENCOUNTER — Telehealth: Payer: Self-pay | Admitting: Orthopedic Surgery

## 2018-12-03 NOTE — Telephone Encounter (Signed)
Robin Sherman called and requested something for pain be called in to [facility] for her.  She also requests the results of her MRI.

## 2018-12-03 NOTE — Telephone Encounter (Signed)
Please call with MRI report   1. Extensive tear involving the midbody and posterior horn of the medial meniscus with secondary moderate arthritic changes in the medial compartment. 2. Prominent joint effusion. 3. Chronic degeneration and hypertrophy of the distal semimembranosus tendon.    Also asking for pain meds  To [employer]

## 2018-12-04 ENCOUNTER — Telehealth: Payer: Self-pay | Admitting: Radiology

## 2018-12-04 ENCOUNTER — Other Ambulatory Visit: Payer: Self-pay | Admitting: Orthopedic Surgery

## 2018-12-04 MED ORDER — HYDROCODONE-ACETAMINOPHEN 5-325 MG PO TABS: 1.0000 | ORAL_TABLET | Freq: Four times a day (QID) | ORAL | 0 refills | Status: AC | PRN

## 2018-12-04 NOTE — Progress Notes (Signed)
58 year old female had an MRI gave her the results shows a torn medial meniscus she has microfractures on the medial side of the joint on the tibia and femur with synovitis  She is anxious to have her situation improved so I recommended arthroscopy with a brace for 6 weeks to handle the microfractures and she has a floor in 10 chance of going on to total knee within the next 3 to 5 years which she was made aware of.  I called her in some medication for pain  Surgery will be scheduled arthroscopy right knee partial medial meniscectomy as soon as we can get on the schedule

## 2018-12-04 NOTE — Telephone Encounter (Signed)
-----   Message from Carole Civil, MD sent at 12/04/2018 12:52 PM EDT ----- Schedule sark medial menisectomy asap

## 2018-12-04 NOTE — Telephone Encounter (Signed)
I called patient  discussed surgery she has walker she is aware she will get a call about surgery pre op and Covid testing

## 2018-12-10 ENCOUNTER — Other Ambulatory Visit: Payer: Self-pay | Admitting: Orthopedic Surgery

## 2018-12-10 NOTE — Patient Instructions (Addendum)
Robin Sherman  12/10/2018     @PREFPERIOPPHARMACY @   Your procedure is scheduled on  12/12/2018.  Report to [facility] at  615  A.M.  Call this number if you have problems the morning of surgery:  [phone]   Remember:  Do not eat or drink after midnight.                   Take these medicines the morning of surgery with A SIP OF WATER  Amlodipine, gabapentin, mobic(if needed), micardis. Use your inhaler before you come.    Do not wear jewelry, make-up or nail polish.  Do not wear lotions, powders, or perfumes, or deodorant.  Do not shave 48 hours prior to surgery.  Men may shave face and neck.  Do not bring valuables to the hospital.  [facility] is not responsible for any belongings or valuables.  Contacts, dentures or bridgework may not be worn into surgery.  Leave your suitcase in the car.  After surgery it may be brought to your room.  For patients admitted to the hospital, discharge time will be determined by your treatment team.  Patients discharged the day of surgery will not be allowed to drive home.   Name and phone number of your driver:   Family Special instructions:  None  Please read over the following fact sheets that you were given. Anesthesia Post-op Instructions and Care and Recovery After Surgery       Arthroscopic Knee Ligament Repair, Care After This sheet gives you information about how to care for yourself after your procedure. Your health care provider may also give you more specific instructions. If you have problems or questions, contact your health care provider. What can I expect after the procedure? After the procedure, it is common to have:  Pain in your knee.  Bruising and swelling on your knee, calf, and ankle for 3-4 days.  Fatigue. Follow these instructions at home: If you have a brace or immobilizer:  Wear the brace or immobilizer as told by your health care provider. Remove it only as told by your health care  provider.  Loosen the splint or immobilizer if your toes tingle, become numb, or turn cold and blue.  Keep the brace or immobilizer clean. Bathing  Do not take baths, swim, or use a hot tub until your health care provider approves. Ask your health care provider if you can take showers.  Keep your bandage (dressing) dry until your health care provider says that it can be removed. Cover it and your brace or immobilizer with a watertight covering when you take a shower. Incision care   Follow instructions from your health care provider about how to take care of your incision. Make sure you: ? Wash your hands with soap and water before you change your bandage (dressing). If soap and water are not available, use hand sanitizer. ? Change your dressing as told by your health care provider. ? Leave stitches (sutures), skin glue, or adhesive strips in place. These skin closures may need to stay in place for 2 weeks or longer. If adhesive strip edges start to loosen and curl up, you may trim the loose edges. Do not remove adhesive strips completely unless your health care provider tells you to do that.  Check your incision area every day for signs of infection. Check for: ? More redness, swelling, or pain. ? More fluid or blood. ? Warmth. ? Pus or a  bad smell. Managing pain, stiffness, and swelling   If directed, put ice on the affected area. ? If you have a removable brace or immobilizer, remove it as told by your health care provider. ? Put ice in a plastic bag. ? Place a towel between your skin and the bag or between your brace or immobilizer and the bag. ? Leave the ice on for 20 minutes, 2-3 times a day.  Move your toes often to avoid stiffness and to lessen swelling.  Raise (elevate) the injured area above the level of your heart while you are sitting or lying down. Driving  Do not drive until your health care provider approves. If you have a brace or immobilizer on your leg, ask  your health care provider when it is safe for you to drive.  Do not drive or use heavy machinery while taking prescription pain medicine. Activity  Rest as directed. Ask your health care provider what activities are safe for you.  Do physical therapy exercises as told by your health care provider. Physical therapy will help you regain strength and motion in your knee.  Follow instructions from your health care provider about: ? When you may start motion exercises. ? When you may start riding a stationary bike and doing other low-impact activities. ? When you may start to jog and do other high-impact activities. Safety  Do not use the injured limb to support your body weight until your health care provider says that you can. Use crutches as told by your health care provider. General instructions  Do not use any products that contain nicotine or tobacco, such as cigarettes and e-cigarettes. These can delay bone healing. If you need help quitting, ask your health care provider.  To prevent or treat constipation while you are taking prescription pain medicine, your health care provider may recommend that you: ? Drink enough fluid to keep your urine clear or pale yellow. ? Take over-the-counter or prescription medicines. ? Eat foods that are high in fiber, such as fresh fruits and vegetables, whole grains, and beans. ? Limit foods that are high in fat and processed sugars, such as fried and sweet foods.  Take over-the-counter and prescription medicines only as told by your health care provider.  Keep all follow-up visits as told by your health care provider. This is important. Contact a health care provider if:  You have more redness, swelling, or pain around an incision.  You have more fluid or blood coming from an incision.  Your incision feels warm to the touch.  You have a fever.  You have pain or swelling in your knee, and it gets worse.  You have pain that does not get  better with medicine. Get help right away if:  You have trouble breathing.  You have pus or a bad smell coming from an incision.  You have numbness and tingling near the knee joint. Summary  After the procedure, it is common to have knee pain with bruising and swelling on your knee, calf, and ankle.  Icing your knee and raising your leg above the level of your heart will help control the pain and the swelling.  Do physical therapy exercises as told by your health care provider. Physical therapy will help you regain strength and motion in your knee. This information is not intended to replace advice given to you by your health care provider. Make sure you discuss any questions you have with your health care provider. Document Released: 04/23/2013 Document  Revised: 06/27/2016 Document Reviewed: 06/27/2016 Elsevier Interactive Patient Education  2019 [place] Anesthesia, Adult, Care After This sheet gives you information about how to care for yourself after your procedure. Your health care provider may also give you more specific instructions. If you have problems or questions, contact your health care provider. What can I expect after the procedure? After the procedure, the following side effects are common:  Pain or discomfort at the IV site.  Nausea.  Vomiting.  Sore throat.  Trouble concentrating.  Feeling cold or chills.  Weak or tired.  Sleepiness and fatigue.  Soreness and body aches. These side effects can affect parts of the body that were not involved in surgery. Follow these instructions at home:  For at least 24 hours after the procedure:  Have a responsible adult stay with you. It is important to have someone help care for you until you are awake and alert.  Rest as needed.  Do not: ? Participate in activities in which you could fall or become injured. ? Drive. ? Use heavy machinery. ? Drink alcohol. ? Take sleeping pills or medicines that  cause drowsiness. ? Make important decisions or sign legal documents. ? Take care of children on your own. Eating and drinking  Follow any instructions from your health care provider about eating or drinking restrictions.  When you feel hungry, start by eating small amounts of foods that are soft and easy to digest (bland), such as toast. Gradually return to your regular diet.  Drink enough fluid to keep your urine pale yellow.  If you vomit, rehydrate by drinking water, juice, or clear broth. General instructions  If you have sleep apnea, surgery and certain medicines can increase your risk for breathing problems. Follow instructions from your health care provider about wearing your sleep device: ? Anytime you are sleeping, including during daytime naps. ? While taking prescription pain medicines, sleeping medicines, or medicines that make you drowsy.  Return to your normal activities as told by your health care provider. Ask your health care provider what activities are safe for you.  Take over-the-counter and prescription medicines only as told by your health care provider.  If you smoke, do not smoke without supervision.  Keep all follow-up visits as told by your health care provider. This is important. Contact a health care provider if:  You have nausea or vomiting that does not get better with medicine.  You cannot eat or drink without vomiting.  You have pain that does not get better with medicine.  You are unable to pass urine.  You develop a skin rash.  You have a fever.  You have redness around your IV site that gets worse. Get help right away if:  You have difficulty breathing.  You have chest pain.  You have blood in your urine or stool, or you vomit blood. Summary  After the procedure, it is common to have a sore throat or nausea. It is also common to feel tired.  Have a responsible adult stay with you for the first 24 hours after general anesthesia. It  is important to have someone help care for you until you are awake and alert.  When you feel hungry, start by eating small amounts of foods that are soft and easy to digest (bland), such as toast. Gradually return to your regular diet.  Drink enough fluid to keep your urine pale yellow.  Return to your normal activities as told by your health care provider. Ask  your health care provider what activities are safe for you. This information is not intended to replace advice given to you by your health care provider. Make sure you discuss any questions you have with your health care provider. Document Released: 10/09/2000 Document Revised: 02/16/2017 Document Reviewed: 02/16/2017 Elsevier Interactive Patient Education  2019 [place].  How to Use Chlorhexidine Before Surgery Chlorhexidine gluconate (CHG) is a germ-killing (antiseptic) solution that is used to clean the skin. It gets rid of the bacteria that normally live on the skin. Cleaning your skin with CHG before surgery helps lower the risk for infection after surgery. To clean your skin before surgery, you may be given:  A CHG solution to use in the shower.  A prepackaged cloth that contains CHG. What are the risks? Risks of using CHG include:  A skin reaction.  Hearing loss, if CHG gets in your ears.  Eye injury, if CHG gets in your eyes and is not rinsed out.  The CHG product catching fire. Make sure that you avoid smoking and flames after applying CHG to your skin. Do not use CHG:  If you have a chlorhexidine allergy or have previously reacted to chlorhexidine.  On babies younger than 40 months of age. How to use CHG solution   Use CHG only as told by your health care provider, and follow the instructions on the label.  Use CHG solution while taking a shower. Follow these steps when using CHG solution (unless your health care provider gives you different instructions): 1. Start the shower. 2. Use your normal soap and  shampoo to wash your face and hair. 3. Turn off the shower or move out of the shower stream. 4. Pour the CHG onto a clean washcloth. Do not use any type of brush or rough-edged sponge. 5. Starting at your neck, lather your body down to your toes. Make sure you:  Pay special attention to the part of your body where you will be having surgery. Scrub this area for at least 1 minute.  Use the full amount of CHG as directed. Usually, this is one bottle.  Do not use CHG on your head or face. If the solution gets into your ears or eyes, rinse them well with water.  Avoid your genital area.  Avoid any areas of skin that have broken skin, cuts, or scrapes.  Scrub your back and under your arms. Make sure to wash skin folds. 6. Let the lather sit on your skin for 1-2 minutes or as long as told by your health care provider. 7. Thoroughly rinse your entire body in the shower. Make sure that all body creases and crevices are rinsed well. 8. Dry off with a clean towel. Do not put any substances on your body afterward, such as powder, lotion, or perfume. 9. Put on clean clothes or pajamas. 10. If it is the night before your surgery, sleep in clean sheets. How to use CHG prepackaged cloths   Only use CHG cloths as told by your health care provider, and follow the instructions on the label.  Use the CHG cloth on clean, dry skin. Follow these steps when using a CHG cloth (unless your health care provider gives you different instructions): 1. Using the CHG cloth, vigorously scrub the part of your body where you will be having surgery. Scrub using a back-and-forth motion for 3 minutes. The area on your body should be completely wet with CHG when you are done scrubbing. 2. Do not rinse. Discard  the cloth and let the area air-dry for 1 minute. Do not put any substances on your body afterward, such as powder, lotion, or perfume. 3. Put on clean clothes or pajamas. 4. If it is the night before your surgery,  sleep in clean sheets. Contact a health care provider if:  Your skin gets irritated after scrubbing.  You have questions about using your solution or cloth. Get help right away if:  Your eyes become very red or swollen.  Your eyes itch badly.  Your skin itches badly and is red or swollen.  Your hearing changes.  You have trouble seeing.  You have swelling or tingling in your mouth or throat.  You have trouble breathing.  You swallow any chlorhexidine. Summary  Chlorhexidine gluconate (CHG) is a germ-killing (antiseptic) solution that is used to clean the skin. Cleaning your skin with CHG before surgery helps lower the risk for infection after surgery.  You may be given CHG to use at home. It may be in a bottle or in a prepackaged cloth to use on your skin. Carefully follow your health care provider's instructions and the instructions on the product label.  Do not use CHG if you have a chlorhexidine allergy.  Contact your health care provider if your skin gets irritated after scrubbing. This information is not intended to replace advice given to you by your health care provider. Make sure you discuss any questions you have with your health care provider. Document Released: 03/27/2012 Document Revised: 05/31/2017 Document Reviewed: 05/31/2017 Elsevier Interactive Patient Education  2019 [employer].

## 2018-12-11 ENCOUNTER — Other Ambulatory Visit: Payer: Self-pay

## 2018-12-11 ENCOUNTER — Encounter (HOSPITAL_COMMUNITY)
Admission: RE | Admit: 2018-12-11 | Discharge: 2018-12-11 | Disposition: A | Discharge status: Home / Self Care | Payer: BLUE CROSS/BLUE SHIELD | Source: Ambulatory Visit | Attending: Orthopedic Surgery | Admitting: Orthopedic Surgery

## 2018-12-11 ENCOUNTER — Encounter (HOSPITAL_COMMUNITY): Payer: Self-pay

## 2018-12-11 ENCOUNTER — Other Ambulatory Visit (HOSPITAL_COMMUNITY)
Admission: RE | Admit: 2018-12-11 | Discharge: 2018-12-11 | Disposition: A | Discharge status: Home / Self Care | Payer: BLUE CROSS/BLUE SHIELD | Source: Ambulatory Visit | Attending: Orthopedic Surgery | Admitting: Orthopedic Surgery

## 2018-12-11 DIAGNOSIS — Z01818 Encounter for other preprocedural examination: Secondary | ICD-10-CM | POA: Insufficient documentation

## 2018-12-11 DIAGNOSIS — Z1159 Encounter for screening for other viral diseases: Secondary | ICD-10-CM | POA: Diagnosis not present

## 2018-12-11 LAB — CBC WITH DIFFERENTIAL/PLATELET
Abs Immature Granulocytes: 0.02 10*3/uL (ref 0.00–0.07)
Basophils Absolute: 0 10*3/uL (ref 0.0–0.1)
Basophils Relative: 1 %
Eosinophils Absolute: 0.3 10*3/uL (ref 0.0–0.5)
Eosinophils Relative: 4 %
HCT: 40.8 % (ref 36.0–46.0)
Hemoglobin: 13 g/dL (ref 12.0–15.0)
Immature Granulocytes: 0 %
Lymphocytes Relative: 29 %
Lymphs Abs: 2.2 10*3/uL (ref 0.7–4.0)
MCH: 28.4 pg (ref 26.0–34.0)
MCHC: 31.9 g/dL (ref 30.0–36.0)
MCV: 89.1 fL (ref 80.0–100.0)
Monocytes Absolute: 0.5 10*3/uL (ref 0.1–1.0)
Monocytes Relative: 7 %
Neutro Abs: 4.5 10*3/uL (ref 1.7–7.7)
Neutrophils Relative %: 59 %
Platelets: 360 10*3/uL (ref 150–400)
RBC: 4.58 MIL/uL (ref 3.87–5.11)
RDW: 14.7 % (ref 11.5–15.5)
WBC: 7.5 10*3/uL (ref 4.0–10.5)
nRBC: 0 % (ref 0.0–0.2)

## 2018-12-11 LAB — BASIC METABOLIC PANEL
Anion gap: 14 (ref 5–15)
BUN: 12 mg/dL (ref 6–20)
CO2: 24 mmol/L (ref 22–32)
Calcium: 9.4 mg/dL (ref 8.9–10.3)
Chloride: 102 mmol/L (ref 98–111)
Creatinine, Ser: 0.64 mg/dL (ref 0.44–1.00)
GFR calc Af Amer: >60 mL/min (ref >60)
GFR calc non Af Amer: >60 mL/min (ref >60)
Glucose, Bld: 149 mg/dL — ABNORMAL HIGH (ref 70–99)
Potassium: 4 mmol/L (ref 3.5–5.1)
Sodium: 140 mmol/L (ref 135–145)

## 2018-12-11 LAB — SARS CORONAVIRUS 2 BY RT PCR (HOSPITAL ORDER, PERFORMED IN ~~LOC~~ HOSPITAL LAB): SARS Coronavirus 2: NEGATIVE

## 2018-12-11 NOTE — H&P (Signed)
Robin Sherman is an 58 y.o. female.   Chief Complaint: pain right knee  HPI: 58 year old female presents for arthroscopy right knee with partial medial meniscectomy  The patient has had a preoperative work-up for ongoing and disabling right knee pain which included prednisone Dosepak injection activity modification including time out of work as well as oral opioid therapy with no improvement  She complains of medial knee pain with catching and other mechanical symptoms such as locking.  She had a positive MRI for medial meniscal tear and osteoarthritis with microfracture of the tibial margin and femoral condyle on the medial side making her at risk for future total knee replacement.  Past Medical History:  Diagnosis Date  . Allergy    pollen  . Arthritis    left knee  . Asthma   . Eczema    Followed by Dr. Nevada Crane dermatology  . Headache(784.0) 09/25/2012  . Hypertension   . Neuromuscular disorder ([place])   . Sciatica of left side 10/13/2013  . TRIGGER FINGER 11/03/2008   Qualifier: Diagnosis of  By: Aline Brochure MD, Dorothyann Peng      Past Surgical History:  Procedure Laterality Date  . ABDOMINAL HYSTERECTOMY     bleeding  . BILATERAL SALPINGECTOMY Bilateral 08/01/2013   Procedure: BILATERAL SALPINGECTOMY;  Surgeon: Jonnie Kind, MD;  Location: [facility];  Service: Gynecology;  Laterality: Bilateral;  . CHONDROPLASTY Left 01/14/2014   Procedure: CHONDROPLASTY OF FEMUR;  Surgeon: Carole Civil, MD;  Location: [facility];  Service: Orthopedics;  Laterality: Left;  . COLONOSCOPY N/A 01/27/2013   Procedure: COLONOSCOPY;  Surgeon: Danie Binder, MD;  Location: [facility];  Service: Endoscopy;  Laterality: N/A;  8:30 AM  . HEMATOMA EVACUATION N/A 08/03/2013   Procedure: EVACUATION PELVIC HEMATOMA;  Surgeon: Jonnie Kind, MD;  Location: [facility];  Service: Gynecology;  Laterality: N/A;  . KNEE ARTHROSCOPY WITH MEDIAL MENISECTOMY Left 01/14/2014   Procedure: KNEE ARTHROSCOPY WITH PARTIAL MEDIAL  MENISECTOMY;  Surgeon: Carole Civil, MD;  Location: [facility];  Service: Orthopedics;  Laterality: Left;  . SUPRACERVICAL ABDOMINAL HYSTERECTOMY N/A 08/01/2013   Procedure: HYSTERECTOMY SUPRACERVICAL ABDOMINAL;  Surgeon: Jonnie Kind, MD;  Location: [facility];  Service: Gynecology;  Laterality: N/A;  . TUBAL LIGATION     [facility]    Family History  Problem Relation Age of Onset  . Heart disease Mother        MI  . Asthma Mother   . Hypertension Mother   . Stroke Father   . Heart disease Father        MI  . Hypertension Father   . Cancer Sister        breast  . Colon cancer Neg Hx    Social History:  reports that she has been smoking cigarettes. She started smoking about 39 years ago. She has a 10.00 pack-year smoking history. She has never used smokeless tobacco. She reports current alcohol use. She reports that she does not use drugs.  Allergies:  Allergies  Allergen Reactions  . Codeine Hives  . Latex Rash  . Naproxen Rash    Patient tolerates Ibuprofen/ poss allergy - little blisters on fingers    No medications prior to admission.    Results for orders placed or performed during the hospital encounter of 12/11/18 (from the past 48 hour(s))  Basic metabolic panel     Status: Abnormal   Collection Time: 12/11/18  9:25 AM  Result Value Ref Range   Sodium  140 135 - 145 mmol/L   Potassium 4.0 3.5 - 5.1 mmol/L   Chloride 102 98 - 111 mmol/L   CO2 24 22 - 32 mmol/L   Glucose, Bld 149 (H) 70 - 99 mg/dL   BUN 12 6 - 20 mg/dL   Creatinine, Ser 0.64 0.44 - 1.00 mg/dL   Calcium 9.4 8.9 - 10.3 mg/dL   GFR calc non Af Amer >60 >60 mL/min   GFR calc Af Amer >60 >60 mL/min   Anion gap 14 5 - 15    Comment: Performed at [facility], [address], [place], [place] [address]  CBC WITH DIFFERENTIAL     Status: None   Collection Time: 12/11/18  9:25 AM  Result Value Ref Range   WBC 7.5 4.0 - 10.5 K/uL   RBC 4.58 3.87 - 5.11 MIL/uL   Hemoglobin 13.0 12.0 - 15.0 g/dL    HCT 40.8 36.0 - 46.0 %   MCV 89.1 80.0 - 100.0 fL   MCH 28.4 26.0 - 34.0 pg   MCHC 31.9 30.0 - 36.0 g/dL   RDW 14.7 11.5 - 15.5 %   Platelets 360 150 - 400 K/uL   nRBC 0.0 0.0 - 0.2 %   Neutrophils Relative % 59 %   Neutro Abs 4.5 1.7 - 7.7 K/uL   Lymphocytes Relative 29 %   Lymphs Abs 2.2 0.7 - 4.0 K/uL   Monocytes Relative 7 %   Monocytes Absolute 0.5 0.1 - 1.0 K/uL   Eosinophils Relative 4 %   Eosinophils Absolute 0.3 0.0 - 0.5 K/uL   Basophils Relative 1 %   Basophils Absolute 0.0 0.0 - 0.1 K/uL   Immature Granulocytes 0 %   Abs Immature Granulocytes 0.02 0.00 - 0.07 K/uL    Comment: Performed at [facility], [address], [place], [place] [address]   No results found.  Review of Systems  Musculoskeletal: Positive for back pain and joint pain.  All other systems reviewed and are negative.   Last menstrual period 04/15/2013. Physical Exam  Constitutional: She is oriented to person, place, and time. She appears well-developed and well-nourished. No distress.  HENT:  Head: Normocephalic and atraumatic.  Mouth/Throat: Oropharynx is clear and moist. No oropharyngeal exudate.  Eyes: Pupils are equal, round, and reactive to light. Conjunctivae and EOM are normal. Right eye exhibits no discharge. Left eye exhibits no discharge. No scleral icterus.  Neck: Normal range of motion. Neck supple. No JVD present. No tracheal deviation present. No thyromegaly present.  Cardiovascular: Normal rate, regular rhythm and intact distal pulses.  Respiratory: Effort normal and breath sounds normal. No stridor. No respiratory distress. She has no wheezes. She exhibits no tenderness.  GI: Soft. Bowel sounds are normal. She exhibits no distension and no mass.  Musculoskeletal:       Arms:       Legs:  Lymphadenopathy:    She has no cervical adenopathy.  Neurological: She is alert and oriented to person, place, and time. She has normal reflexes. She displays normal reflexes. No cranial  nerve deficit. She exhibits normal muscle tone. Coordination normal.  Skin: Skin is warm and dry. No rash noted. She is not diaphoretic. No erythema. No pallor.  Psychiatric: She has a normal mood and affect. Her behavior is normal. Judgment and thought content normal.     Assessment/Plan Plain films and MRI were obtained  The patient has a torn medial meniscus with microfractures/subchondral bone edema medial femoral condyle medial tibial plateau.  3 views right knee   AP lateral and sunrise view of the right knee show varus alignment to the right knee   Alignment varus mild to moderate   Medial compartment joint space narrowing cyst formation and sclerosis mild osteophytes graph the lateral x-ray shows severe patellofemoral disease posterior physis on the femur and tibia anterior tibial osteophyte   Patella shows normal tracking femoral osteophytes large medial small to moderate size lateral   Varus alignment mild to moderate arthritis severe   The procedure has been fully reviewed with the patient; The risks and benefits of surgery have been discussed and explained and understood. Alternative treatment has also been reviewed, questions were encouraged and answered. The postoperative plan is also been reviewed.  MRI right knee IMPRESSION: 1. Extensive tear involving the midbody and posterior horn of the medial meniscus with secondary moderate arthritic changes in the medial compartment. 2. Prominent joint effusion. 3. Chronic degeneration and hypertrophy of the distal semimembranosus tendon.     Electronically Signed   By: Lorriane Shire M.D.   On: 11/29/2018 12:04    Result History   Plan arthroscopy right knee partial medial meniscectomy  Arther Abbott, MD 12/11/2018, 1:28 PM

## 2018-12-12 ENCOUNTER — Encounter (HOSPITAL_COMMUNITY)
Admission: RE | Disposition: A | Discharge status: Home / Self Care | Payer: Self-pay | Source: Home / Self Care | Attending: Orthopedic Surgery

## 2018-12-12 ENCOUNTER — Other Ambulatory Visit: Payer: Self-pay

## 2018-12-12 ENCOUNTER — Ambulatory Visit (HOSPITAL_COMMUNITY)
Admission: RE | Admit: 2018-12-12 | Discharge: 2018-12-12 | Disposition: A | Discharge status: Home / Self Care | Payer: BLUE CROSS/BLUE SHIELD | Attending: Orthopedic Surgery | Admitting: Orthopedic Surgery

## 2018-12-12 ENCOUNTER — Ambulatory Visit (HOSPITAL_COMMUNITY): Payer: BLUE CROSS/BLUE SHIELD | Admitting: Anesthesiology

## 2018-12-12 ENCOUNTER — Encounter (HOSPITAL_COMMUNITY): Payer: Self-pay | Admitting: *Deleted

## 2018-12-12 DIAGNOSIS — S83242D Other tear of medial meniscus, current injury, left knee, subsequent encounter: Secondary | ICD-10-CM

## 2018-12-12 DIAGNOSIS — M67863 Other specified disorders of tendon, right knee: Secondary | ICD-10-CM | POA: Insufficient documentation

## 2018-12-12 DIAGNOSIS — J45909 Unspecified asthma, uncomplicated: Secondary | ICD-10-CM | POA: Insufficient documentation

## 2018-12-12 DIAGNOSIS — F17210 Nicotine dependence, cigarettes, uncomplicated: Secondary | ICD-10-CM | POA: Diagnosis not present

## 2018-12-12 DIAGNOSIS — M1711 Unilateral primary osteoarthritis, right knee: Secondary | ICD-10-CM | POA: Diagnosis not present

## 2018-12-12 DIAGNOSIS — M25761 Osteophyte, right knee: Secondary | ICD-10-CM | POA: Insufficient documentation

## 2018-12-12 DIAGNOSIS — M23321 Other meniscus derangements, posterior horn of medial meniscus, right knee: Secondary | ICD-10-CM | POA: Insufficient documentation

## 2018-12-12 DIAGNOSIS — G709 Myoneural disorder, unspecified: Secondary | ICD-10-CM | POA: Diagnosis not present

## 2018-12-12 DIAGNOSIS — S83242A Other tear of medial meniscus, current injury, left knee, initial encounter: Secondary | ICD-10-CM

## 2018-12-12 DIAGNOSIS — M659 Synovitis and tenosynovitis, unspecified: Secondary | ICD-10-CM | POA: Diagnosis not present

## 2018-12-12 DIAGNOSIS — M84461A Pathological fracture, right tibia, initial encounter for fracture: Secondary | ICD-10-CM | POA: Diagnosis not present

## 2018-12-12 DIAGNOSIS — I10 Essential (primary) hypertension: Secondary | ICD-10-CM | POA: Insufficient documentation

## 2018-12-12 DIAGNOSIS — M65861 Other synovitis and tenosynovitis, right lower leg: Secondary | ICD-10-CM | POA: Diagnosis not present

## 2018-12-12 DIAGNOSIS — S83231A Complex tear of medial meniscus, current injury, right knee, initial encounter: Secondary | ICD-10-CM | POA: Diagnosis not present

## 2018-12-12 DIAGNOSIS — M25461 Effusion, right knee: Secondary | ICD-10-CM | POA: Insufficient documentation

## 2018-12-12 DIAGNOSIS — M84451A Pathological fracture, right femur, initial encounter for fracture: Secondary | ICD-10-CM | POA: Diagnosis not present

## 2018-12-12 HISTORY — PX: KNEE ARTHROSCOPY WITH MEDIAL MENISECTOMY: SHX5651

## 2018-12-12 HISTORY — PX: CHONDROPLASTY: SHX5177

## 2018-12-12 SURGERY — ARTHROSCOPY, KNEE, WITH MEDIAL MENISCECTOMY
Anesthesia: General | Site: Knee | Laterality: Right

## 2018-12-12 MED ORDER — MIDAZOLAM HCL 2 MG/2ML IJ SOLN: 0.5000 mg | Freq: Once | INTRAMUSCULAR | Status: DC | PRN

## 2018-12-12 MED ORDER — BUPIVACAINE-EPINEPHRINE (PF) 0.5% -1:200000 IJ SOLN
INTRAMUSCULAR | Status: DC | PRN
  Administered 2018-12-12: 60 mL via PERINEURAL

## 2018-12-12 MED ORDER — PROMETHAZINE HCL 12.5 MG PO TABS: 12.5000 mg | ORAL_TABLET | Freq: Four times a day (QID) | ORAL | 0 refills | Status: DC | PRN

## 2018-12-12 MED ORDER — PROPOFOL 10 MG/ML IV BOLUS
INTRAVENOUS | Status: DC | PRN
  Administered 2018-12-12: 160 mg via INTRAVENOUS

## 2018-12-12 MED ORDER — DIPHENHYDRAMINE HCL 25 MG PO CAPS
25.0000 mg | ORAL_CAPSULE | Freq: Once | ORAL | Status: AC
  Administered 2018-12-12: 25 mg via ORAL
  Filled 2018-12-12: qty 1

## 2018-12-12 MED ORDER — FENTANYL CITRATE (PF) 100 MCG/2ML IJ SOLN
50.0000 ug | INTRAMUSCULAR | Status: DC | PRN
  Administered 2018-12-12: 50 ug via INTRAVENOUS

## 2018-12-12 MED ORDER — ONDANSETRON HCL 4 MG/2ML IJ SOLN
INTRAMUSCULAR | Status: AC
  Filled 2018-12-12: qty 2

## 2018-12-12 MED ORDER — LACTATED RINGERS IV SOLN
INTRAVENOUS | Status: DC
  Administered 2018-12-12: 1000 mL via INTRAVENOUS

## 2018-12-12 MED ORDER — SUGAMMADEX SODIUM 200 MG/2ML IV SOLN
INTRAVENOUS | Status: DC | PRN
  Administered 2018-12-12: 100 mg via INTRAVENOUS

## 2018-12-12 MED ORDER — CEFAZOLIN SODIUM-DEXTROSE 2-4 GM/100ML-% IV SOLN
INTRAVENOUS | Status: AC
  Filled 2018-12-12: qty 100

## 2018-12-12 MED ORDER — FENTANYL CITRATE (PF) 250 MCG/5ML IJ SOLN
INTRAMUSCULAR | Status: AC
  Filled 2018-12-12: qty 5

## 2018-12-12 MED ORDER — SODIUM CHLORIDE 0.9 % IR SOLN
Status: DC | PRN
  Administered 2018-12-12 (×4): 3000 mL

## 2018-12-12 MED ORDER — ROCURONIUM 10MG/ML (10ML) SYRINGE FOR MEDFUSION PUMP - OPTIME
INTRAVENOUS | Status: DC | PRN
  Administered 2018-12-12: 20 mg via INTRAVENOUS
  Administered 2018-12-12: 10 mg via INTRAVENOUS

## 2018-12-12 MED ORDER — HYDROCODONE-ACETAMINOPHEN 7.5-325 MG PO TABS
1.0000 | ORAL_TABLET | Freq: Once | ORAL | Status: DC | PRN
  Filled 2018-12-12: qty 1

## 2018-12-12 MED ORDER — PROMETHAZINE HCL 25 MG/ML IJ SOLN: 6.2500 mg | INTRAMUSCULAR | Status: DC | PRN

## 2018-12-12 MED ORDER — BUPIVACAINE-EPINEPHRINE (PF) 0.5% -1:200000 IJ SOLN
INTRAMUSCULAR | Status: AC
  Filled 2018-12-12: qty 60

## 2018-12-12 MED ORDER — PROPOFOL 10 MG/ML IV BOLUS
INTRAVENOUS | Status: AC
  Filled 2018-12-12: qty 40

## 2018-12-12 MED ORDER — EPINEPHRINE PF 1 MG/ML IJ SOLN
INTRAMUSCULAR | Status: AC
  Filled 2018-12-12: qty 7

## 2018-12-12 MED ORDER — CHLORHEXIDINE GLUCONATE 4 % EX LIQD: 60.0000 mL | Freq: Once | CUTANEOUS | Status: DC

## 2018-12-12 MED ORDER — HYDROCODONE-ACETAMINOPHEN 7.5-325 MG PO TABS: 1.0000 | ORAL_TABLET | ORAL | 0 refills | Status: DC | PRN

## 2018-12-12 MED ORDER — FENTANYL CITRATE (PF) 100 MCG/2ML IJ SOLN
INTRAMUSCULAR | Status: DC | PRN
  Administered 2018-12-12 (×4): 50 ug via INTRAVENOUS

## 2018-12-12 MED ORDER — ONDANSETRON HCL 4 MG/2ML IJ SOLN
4.0000 mg | Freq: Once | INTRAMUSCULAR | Status: AC
  Administered 2018-12-12: 4 mg via INTRAVENOUS

## 2018-12-12 MED ORDER — FENTANYL CITRATE (PF) 100 MCG/2ML IJ SOLN
INTRAMUSCULAR | Status: AC
  Filled 2018-12-12: qty 2

## 2018-12-12 MED ORDER — 0.9 % SODIUM CHLORIDE (POUR BTL) OPTIME
TOPICAL | Status: DC | PRN
  Administered 2018-12-12: 1000 mL

## 2018-12-12 MED ORDER — SUCCINYLCHOLINE 20MG/ML (10ML) SYRINGE FOR MEDFUSION PUMP - OPTIME
INTRAMUSCULAR | Status: DC | PRN
  Administered 2018-12-12: 120 mg via INTRAVENOUS

## 2018-12-12 MED ORDER — CEFAZOLIN SODIUM-DEXTROSE 2-4 GM/100ML-% IV SOLN
2.0000 g | INTRAVENOUS | Status: AC
  Administered 2018-12-12: 2 g via INTRAVENOUS

## 2018-12-12 MED ORDER — LABETALOL HCL 5 MG/ML IV SOLN
INTRAVENOUS | Status: DC | PRN
  Administered 2018-12-12: 5 mg via INTRAVENOUS

## 2018-12-12 MED ORDER — HYDROCODONE-ACETAMINOPHEN 7.5-325 MG PO TABS
1.0000 | ORAL_TABLET | Freq: Once | ORAL | Status: AC
  Administered 2018-12-12: 1 via ORAL

## 2018-12-12 MED ORDER — FENTANYL CITRATE (PF) 100 MCG/2ML IJ SOLN
50.0000 ug | INTRAMUSCULAR | Status: DC | PRN
  Administered 2018-12-12 (×3): 50 ug via INTRAVENOUS
  Filled 2018-12-12: qty 2

## 2018-12-12 SURGICAL SUPPLY — 52 items
APL PRP STRL LF DISP 70% ISPRP (MISCELLANEOUS) ×1
BANDAGE ELASTIC 6 LF NS (GAUZE/BANDAGES/DRESSINGS) ×3 IMPLANT
BLADE AGGRESSIVE PLUS 4.0 (BLADE) ×3 IMPLANT
BLADE SURG SZ11 CARB STEEL (BLADE) ×3 IMPLANT
BNDG CMPR MED 5X6 ELC HKLP NS (GAUZE/BANDAGES/DRESSINGS) ×1
CHLORAPREP W/TINT 26 (MISCELLANEOUS) ×3 IMPLANT
CLOTH BEACON ORANGE TIMEOUT ST (SAFETY) ×3 IMPLANT
COOLER CRYO IC GRAV AND TUBE (ORTHOPEDIC SUPPLIES) ×3 IMPLANT
COVER WAND RF STERILE (DRAPES) ×2 IMPLANT
CUFF CRYO KNEE LG 20X31 COOLER (ORTHOPEDIC SUPPLIES) ×2 IMPLANT
CUFF TOURNIQUET SINGLE 34IN LL (TOURNIQUET CUFF) ×2 IMPLANT
DECANTER SPIKE VIAL GLASS SM (MISCELLANEOUS) ×6 IMPLANT
GAUZE 4X4 16PLY RFD (DISPOSABLE) ×3 IMPLANT
GAUZE SPONGE 4X4 12PLY STRL (GAUZE/BANDAGES/DRESSINGS) ×3 IMPLANT
GAUZE SPONGE 4X4 16PLY XRAY LF (GAUZE/BANDAGES/DRESSINGS) ×3 IMPLANT
GAUZE XEROFORM 5X9 LF (GAUZE/BANDAGES/DRESSINGS) ×3 IMPLANT
GLOVE BIOGEL PI IND STRL 7.0 (GLOVE) ×2 IMPLANT
GLOVE BIOGEL PI INDICATOR 7.0 (GLOVE) ×4
GLOVE SKINSENSE NS SZ8.0 LF (GLOVE) ×2
GLOVE SKINSENSE STRL SZ8.0 LF (GLOVE) ×1 IMPLANT
GLOVE SS N UNI LF 8.5 STRL (GLOVE) ×3 IMPLANT
GLOVE SURG SS PI 7.0 STRL IVOR (GLOVE) ×2 IMPLANT
GOWN STRL REUS W/ TWL LRG LVL3 (GOWN DISPOSABLE) ×1 IMPLANT
GOWN STRL REUS W/TWL LRG LVL3 (GOWN DISPOSABLE) ×3
GOWN STRL REUS W/TWL XL LVL3 (GOWN DISPOSABLE) ×3 IMPLANT
IV NS IRRIG 3000ML ARTHROMATIC (IV SOLUTION) ×10 IMPLANT
KIT BLADEGUARD II DBL (SET/KITS/TRAYS/PACK) ×3 IMPLANT
KIT TURNOVER CYSTO (KITS) ×3 IMPLANT
MANIFOLD NEPTUNE II (INSTRUMENTS) ×3 IMPLANT
MARKER SKIN DUAL TIP RULER LAB (MISCELLANEOUS) ×3 IMPLANT
NDL HYPO 18GX1.5 BLUNT FILL (NEEDLE) ×1 IMPLANT
NDL HYPO 21X1.5 SAFETY (NEEDLE) ×1 IMPLANT
NDL SPNL 18GX3.5 QUINCKE PK (NEEDLE) ×1 IMPLANT
NEEDLE HYPO 18GX1.5 BLUNT FILL (NEEDLE) ×3 IMPLANT
NEEDLE HYPO 21X1.5 SAFETY (NEEDLE) ×3 IMPLANT
NEEDLE SPNL 18GX3.5 QUINCKE PK (NEEDLE) ×3 IMPLANT
NS IRRIG 1000ML POUR BTL (IV SOLUTION) ×3 IMPLANT
PACK ARTHRO LIMB DRAPE STRL (MISCELLANEOUS) ×3 IMPLANT
PAD ABD 5X9 TENDERSORB (GAUZE/BANDAGES/DRESSINGS) ×3 IMPLANT
PAD ARMBOARD 7.5X6 YLW CONV (MISCELLANEOUS) ×3 IMPLANT
PADDING CAST COTTON 6X4 STRL (CAST SUPPLIES) ×3 IMPLANT
PADDING WEBRIL 6 STERILE (GAUZE/BANDAGES/DRESSINGS) ×2 IMPLANT
PROBE BIPOLAR 50 DEGREE SUCT (MISCELLANEOUS) ×2 IMPLANT
SET ARTHROSCOPY INST (INSTRUMENTS) ×3 IMPLANT
SET BASIN LINEN APH (SET/KITS/TRAYS/PACK) ×3 IMPLANT
SUT ETHILON 3 0 FSL (SUTURE) ×3 IMPLANT
SYR 10ML LL (SYRINGE) ×3 IMPLANT
SYR 30ML LL (SYRINGE) ×3 IMPLANT
TUBE CONNECTING 12'X1/4 (SUCTIONS) ×2
TUBE CONNECTING 12X1/4 (SUCTIONS) ×4 IMPLANT
TUBING ARTHRO INFLOW-ONLY STRL (TUBING) ×3 IMPLANT
WHISKER CUTTER 4 (BUR) ×2 IMPLANT

## 2018-12-12 NOTE — Anesthesia Preprocedure Evaluation (Signed)
Anesthesia Evaluation  Patient identified by MRN, date of birth, ID band Patient awake    Reviewed: Allergy & Precautions, NPO status , Patient's Chart, lab work & pertinent test results  Airway Mallampati: III  TM Distance: >3 FB Neck ROM: Full    Dental no notable dental hx. (+) Teeth Intact   Pulmonary asthma , Current Smoker,    Pulmonary exam normal breath sounds clear to auscultation       Cardiovascular Exercise Tolerance: Good hypertension, Pt. on medications negative cardio ROS Normal cardiovascular examI Rhythm:Regular Rate:Normal     Neuro/Psych  Headaches,  Neuromuscular disease negative psych ROS   GI/Hepatic negative GI ROS, Neg liver ROS,   Endo/Other  Morbid obesity  Renal/GU negative Renal ROS  negative genitourinary   Musculoskeletal  (+) Arthritis , Osteoarthritis,    Abdominal   Peds negative pediatric ROS (+)  Hematology negative hematology ROS (+)   Anesthesia Other Findings   Reproductive/Obstetrics negative OB ROS                             Anesthesia Physical Anesthesia Plan  ASA: III  Anesthesia Plan: General   Post-op Pain Management:    Induction: Intravenous  PONV Risk Score and Plan:   Airway Management Planned: Oral ETT and LMA  Additional Equipment:   Intra-op Plan:   Post-operative Plan: Extubation in OR  Informed Consent: I have reviewed the patients History and Physical, chart, labs and discussed the procedure including the risks, benefits and alternatives for the proposed anesthesia with the patient or authorized representative who has indicated his/her understanding and acceptance.     Dental advisory given  Plan Discussed with: CRNA  Anesthesia Plan Comments: (Plan Full PPE use  Plan GA -LMA vs ETT as needed -WTP same )        Anesthesia Quick Evaluation

## 2018-12-12 NOTE — Transfer of Care (Signed)
Immediate Anesthesia Transfer of Care Note  Patient: Robin Sherman  Procedure(s) Performed: KNEE ARTHROSCOPY WITH MEDIAL MENISCECTOMY (Right ) CHONDROPLASTY (Right Knee)  Patient Location: PACU  Anesthesia Type:General  Level of Consciousness: awake, alert  and oriented  Airway & Oxygen Therapy: Patient Spontanous Breathing  Post-op Assessment: Report given to RN  Post vital signs: Reviewed and stable  Last Vitals:  Vitals Value Taken Time  BP 158/99 12/12/2018  8:50 AM  Temp    Pulse 94 12/12/2018  8:55 AM  Resp 16 12/12/2018  8:55 AM  SpO2 100 % 12/12/2018  8:55 AM  Vitals shown include unvalidated device data.  Last Pain:  Vitals:   12/12/18 0851  TempSrc:   PainSc: (P) 2       Patients Stated Pain Goal: 7 (35/82/51 8984)  Complications: No apparent anesthesia complications

## 2018-12-12 NOTE — Op Note (Signed)
12/12/2018  8:42 AM  PATIENT:  Robin Sherman  58 y.o. female  PRE-OPERATIVE DIAGNOSIS:  right medial knee meniscal tear, osteoarthritis medial compartment, microfracture medial femoral condyle and tibial plateau POST-OPERATIVE DIAGNOSIS: Same as above  PROCEDURE:  Procedure(s): KNEE ARTHROSCOPY WITH MEDIAL MENISCECTOMY (Right)-29881 CHONDROPLASTY (Right)  SURGEON:  Surgeon(s) and Role:    Carole Civil, MD - Primary  Findings at surgery: Lateral compartment normal ACL PCL normal Extensive synovitis throughout the joint Mid body medial meniscus tear complex Grade 3 and 4 chondral degeneration medial femoral condyle involving the entire weightbearing surface with a grade 4 lesion of approximately 5 x 5 mm  Surgery was done as follows  The patient was seen in the preop area and her chart was reviewed including imaging.  Site of surgery was confirmed as right knee and marked.  The patient was taken to the surgical suite for general anesthesia.  The right lower extremity was prepped and draped sterilely.  Timeout was completed  The right leg was in a arthroscopic leg holder.  I made a lateral portal injected the portal with half percent Marcaine with epinephrine and then placed the scope into the joint and I did a diagnostic arthroscopy.  I then made a medial portal and remove the torn meniscus with a motorized shaver and a 50 degree ArthroCare wand.  40% of the mid body was removed.  I contoured the remaining meniscal tissue and confirmed stability with a probe  I then used the turbo whisker to do a chondroplasty on the medial femoral condyle.  I placed the scope into the suprapatellar pouch and did a synovectomy using cautery to control bleeding  I irrigated the knee with the wash mode of the arthroscopic pump and suctioned all the fluid with the shaver blade.  I injected the soft tissues on the medial side and the joint with half percent Marcaine with epinephrine for a total  of 60 cc I placed 2 sutures 1 in each portal using 3-0 nylon.  I wrapped the knee and sterile dressings in place an Ace bandage and a Cryo/Cuff which was activated.  The patient was then taken to recovery room in stable condition  PHYSICIAN ASSISTANT:   ASSISTANTS: none   ANESTHESIA:   general  EBL:  20 mL   BLOOD ADMINISTERED:none  DRAINS: none   LOCAL MEDICATIONS USED:  MARCAINE  W/ epi   and Amount: 60 ml  SPECIMEN:  No Specimen  DISPOSITION OF SPECIMEN:  N/A  COUNTS:  YES  TOURNIQUET:  * Missing tourniquet times found for documented tourniquets in log: [id number] *  DICTATION: .Viviann Spare Dictation  PLAN OF CARE: Discharge to home after PACU  PATIENT DISPOSITION:  PACU - hemodynamically stable.   Delay start of Pharmacological VTE agent (>24hrs) due to surgical blood loss or risk of bleeding: not applicable

## 2018-12-12 NOTE — Anesthesia Procedure Notes (Signed)
Procedure Name: Intubation Date/Time: 12/12/2018 7:45 AM Performed by: Ollen Bowl, CRNA Pre-anesthesia Checklist: Patient identified, Patient being monitored, Timeout performed, Emergency Drugs available and Suction available Patient Re-evaluated:Patient Re-evaluated prior to induction Oxygen Delivery Method: Circle system utilized Preoxygenation: Pre-oxygenation with 100% oxygen Induction Type: IV induction Ventilation: Mask ventilation without difficulty Laryngoscope Size: Mac and 3 Grade View: Grade I Tube type: Oral Tube size: 7.0 mm Number of attempts: 1 Airway Equipment and Method: Stylet Placement Confirmation: ETT inserted through vocal cords under direct vision,  positive ETCO2 and breath sounds checked- equal and bilateral Secured at: 21 cm Tube secured with: Tape Dental Injury: Teeth and Oropharynx as per pre-operative assessment

## 2018-12-12 NOTE — Anesthesia Postprocedure Evaluation (Signed)
Anesthesia Post Note  Patient: Robin Sherman  Procedure(s) Performed: KNEE ARTHROSCOPY WITH MEDIAL MENISCECTOMY (Right ) CHONDROPLASTY (Right Knee)  Patient location during evaluation: PACU Anesthesia Type: General Level of consciousness: awake and alert and oriented Pain management: pain level controlled Vital Signs Assessment: post-procedure vital signs reviewed and stable Respiratory status: spontaneous breathing Cardiovascular status: blood pressure returned to baseline and stable Postop Assessment: no apparent nausea or vomiting Anesthetic complications: no     Last Vitals:  Vitals:   12/12/18 0930 12/12/18 0956  BP: (!) 142/99 (!) 147/76  Pulse: 91 86  Resp: 18 17  Temp:  36.8 C  SpO2: 91% 94%    Last Pain:  Vitals:   12/12/18 0956  TempSrc:   PainSc: 6                  Kourtney Montesinos

## 2018-12-12 NOTE — Brief Op Note (Signed)
12/12/2018  8:42 AM  PATIENT:  Robin Sherman  58 y.o. female  PRE-OPERATIVE DIAGNOSIS:  right medial knee meniscal tear, osteoarthritis medial compartment, microfracture medial femoral condyle and tibial plateau POST-OPERATIVE DIAGNOSIS: Same as above  PROCEDURE:  Procedure(s): KNEE ARTHROSCOPY WITH MEDIAL MENISCECTOMY (Right)-29881 CHONDROPLASTY (Right)  SURGEON:  Surgeon(s) and Role:    Carole Civil, MD - Primary  Findings at surgery: Lateral compartment normal ACL PCL normal Extensive synovitis throughout the joint Mid body medial meniscus tear complex Grade 3 and 4 chondral degeneration medial femoral condyle involving the entire weightbearing surface with a grade 4 lesion of approximately 5 x 5 mm  Surgery was done as follows  The patient was seen in the preop area and her chart was reviewed including imaging.  Site of surgery was confirmed as right knee and marked.  The patient was taken to the surgical suite for general anesthesia.  The right lower extremity was prepped and draped sterilely.  Timeout was completed  The right leg was in a arthroscopic leg holder.  I made a lateral portal injected the portal with half percent Marcaine with epinephrine and then placed the scope into the joint and I did a diagnostic arthroscopy.  I then made a medial portal and remove the torn meniscus with a motorized shaver and a 50 degree ArthroCare wand.  40% of the mid body was removed.  I contoured the remaining meniscal tissue and confirmed stability with a probe  I then used the turbo whisker to do a chondroplasty on the medial femoral condyle.  I placed the scope into the suprapatellar pouch and did a synovectomy using cautery to control bleeding  I irrigated the knee with the wash mode of the arthroscopic pump and suctioned all the fluid with the shaver blade.  I injected the soft tissues on the medial side and the joint with half percent Marcaine with epinephrine for a total  of 60 cc I placed 2 sutures 1 in each portal using 3-0 nylon.  I wrapped the knee and sterile dressings in place an Ace bandage and a Cryo/Cuff which was activated.  The patient was then taken to recovery room in stable condition  PHYSICIAN ASSISTANT:   ASSISTANTS: none   ANESTHESIA:   general  EBL:  20 mL   BLOOD ADMINISTERED:none  DRAINS: none   LOCAL MEDICATIONS USED:  MARCAINE  W/ epi   and Amount: 60 ml  SPECIMEN:  No Specimen  DISPOSITION OF SPECIMEN:  N/A  COUNTS:  YES  TOURNIQUET:  * Missing tourniquet times found for documented tourniquets in log: [id number] *  DICTATION: .Viviann Spare Dictation  PLAN OF CARE: Discharge to home after PACU  PATIENT DISPOSITION:  PACU - hemodynamically stable.   Delay start of Pharmacological VTE agent (>24hrs) due to surgical blood loss or risk of bleeding: not applicable

## 2018-12-12 NOTE — Discharge Instructions (Signed)
The surgery went well I was able to remove the torn piece of meniscus which involved approximately 15% of the meniscus You also had arthritis on the same side as the meniscus was torn and we shaved the irregular tissue making it smooth  As you recall when you come to the office you will be placed in a brace to handle the subchondral fractures that we saw on the MRI  You may need to take some Benadryl with the hydrocodone if it makes you itch.  You can supplement the hydrocodone with ibuprofen as needed   Knee Arthroscopy, Care After This sheet gives you information about how to care for yourself after your procedure. Your health care provider may also give you more specific instructions. If you have problems or questions, contact your health care provider. What can I expect after the procedure? After the procedure, it is common to have:  Soreness.  Swelling.  Pain that can be relieved by taking pain medicine. Follow these instructions at home: Incision care   Follow instructions from your health care provider about how to take care of your incisions. Make sure you: ? Wash your hands with soap and water before you change your bandage (dressing). If soap and water are not available, use hand sanitizer. ? Change your dressing as told by your health care provider. ? Leave stitches (sutures), staples, skin glue, or adhesive strips in place. These skin closures may need to stay in place for 2 weeks or longer. If adhesive strip edges start to loosen and curl up, you may trim the loose edges. Do not remove adhesive strips completely unless your health care provider tells you to do that.  Check your incision areas every day for signs of infection. Check for: ? Redness. ? More swelling or pain. ? Fluid or blood. ? Warmth. ? Pus or a bad smell. Bathing  Do not take baths, swim, or use a hot tub until your health care provider approves. Ask your health care provider if you may take showers.  You may only be allowed to take sponge baths. Activity  Do not use your knee to support your body weight until your health care provider says that you can. Follow weight-bearing restrictions as told. Use crutches or other devices to help you move around (assistive devices) as directed.  Ask your health care provider what activities are safe for you during recovery, and what activities you need to avoid.  If physical therapy was prescribed, do exercises as directed. Doing exercises may help improve knee movement and flexibility (range of motion).  Do not lift anything that is heavier than 10 lb (4.5 kg), or the limit that you are told, until your health care provider says that it is safe. Driving  Do not drive until your health care provider approves. You may be able to drive after 1-3 weeks.  Do not drive or use heavy machinery while taking prescription pain medicine. Managing pain, stiffness, and swelling   If directed, put ice on the injured area: ? Put ice in a plastic bag or use the icing device (cold therapy unit) that you were given. Follow instructions from your health care provider about how to use the icing device. ? Place a towel between your skin and the bag or between your skin and the icing device. ? Leave the ice on for 20 minutes, 2-3 times a day.  Move your toes often to avoid stiffness and to lessen swelling.  Raise (elevate) the injured area  above the level of your heart while you are sitting or lying down. If you are taking blood thinners:  Before you take any medicines that contain aspirin or NSAIDs, talk with your health care provider. These medicines increase your risk for dangerous bleeding.  Take your medicine exactly as told, at the same time every day.  Avoid activities that could cause injury or bruising, and follow instructions about how to prevent falls.  Wear a medical alert bracelet or carry a card that lists what medicines you take. General  instructions  Take over-the-counter and prescription medicines only as told by your health care provider.  If you are taking prescription pain medicine, take actions to prevent or treat constipation. Your health care provider may recommend that you: ? Drink enough fluid to keep your urine pale yellow. ? Eat foods that are high in fiber, such as fresh fruits and vegetables, whole grains, and beans. ? Limit foods that are high in fat and processed sugars, such as fried or sweet foods. ? Take an over-the-counter or prescription medicines for constipation.  Do not use any products that contain nicotine or tobacco, such as cigarettes and e-cigarettes. These can delay incision or bone healing. If you need help quitting, ask your health care provider.  Wear compression stockings as told by your health care provider. These stockings help to prevent blood clots and reduce swelling in your legs.  Keep all follow-up visits as told by your health care provider. This is important. Contact a health care provider if you:  Have a fever.  Have severe pain.  Have redness around an incision.  Have more swelling.  Have fluid or blood coming from an incision.  Notice that an incision feels warm to the touch.  Notice pus or a bad smell coming from an incision.  Notice that an incision opens up.  Develop a rash. Get help right away if you:  Have difficulty breathing.  Have shortness of breath.  Have chest pain.  Develop pain in your lower leg or at the back of your knee.  Have numbness or tingling in your lower leg or your foot. Summary  Raise (elevate) the injured area above the level of your heart while you are sitting or lying down.  To help relieve pain and swelling, put ice on your leg for 20 minutes at a time, 2-3 times a day.  If you were prescribed a blood thinner, avoid activities that could cause injury or bruising, and follow instructions about how to prevent falls.  If  physical therapy was prescribed, do exercises as directed. Doing exercises may help improve range of motion. This information is not intended to replace advice given to you by your health care provider. Make sure you discuss any questions you have with your health care provider. Document Released: 01/20/2005 Document Revised: 05/16/2017 Document Reviewed: 05/16/2017 Elsevier Interactive Patient Education  2019 [employer] Anesthesia, Adult, Care After This sheet gives you information about how to care for yourself after your procedure. Your health care provider may also give you more specific instructions. If you have problems or questions, contact your health care provider. What can I expect after the procedure? After the procedure, the following side effects are common: Pain or discomfort at the IV site. Nausea. Vomiting. Sore throat. Trouble concentrating. Feeling cold or chills. Weak or tired. Sleepiness and fatigue. Soreness and body aches. These side effects can affect parts of the body that were not involved in surgery. Follow  these instructions at home:  For at least 24 hours after the procedure: Have a responsible adult stay with you. It is important to have someone help care for you until you are awake and alert. Rest as needed. Do not: Participate in activities in which you could fall or become injured. Drive. Use heavy machinery. Drink alcohol. Take sleeping pills or medicines that cause drowsiness. Make important decisions or sign legal documents. Take care of children on your own. Eating and drinking Follow any instructions from your health care provider about eating or drinking restrictions. When you feel hungry, start by eating small amounts of foods that are soft and easy to digest (bland), such as toast. Gradually return to your regular diet. Drink enough fluid to keep your urine pale yellow. If you vomit, rehydrate by drinking water, juice, or clear  broth. General instructions If you have sleep apnea, surgery and certain medicines can increase your risk for breathing problems. Follow instructions from your health care provider about wearing your sleep device: Anytime you are sleeping, including during daytime naps. While taking prescription pain medicines, sleeping medicines, or medicines that make you drowsy. Return to your normal activities as told by your health care provider. Ask your health care provider what activities are safe for you. Take over-the-counter and prescription medicines only as told by your health care provider. If you smoke, do not smoke without supervision. Keep all follow-up visits as told by your health care provider. This is important. Contact a health care provider if: You have nausea or vomiting that does not get better with medicine. You cannot eat or drink without vomiting. You have pain that does not get better with medicine. You are unable to pass urine. You develop a skin rash. You have a fever. You have redness around your IV site that gets worse. Get help right away if: You have difficulty breathing. You have chest pain. You have blood in your urine or stool, or you vomit blood. Summary After the procedure, it is common to have a sore throat or nausea. It is also common to feel tired. Have a responsible adult stay with you for the first 24 hours after general anesthesia. It is important to have someone help care for you until you are awake and alert. When you feel hungry, start by eating small amounts of foods that are soft and easy to digest (bland), such as toast. Gradually return to your regular diet. Drink enough fluid to keep your urine pale yellow. Return to your normal activities as told by your health care provider. Ask your health care provider what activities are safe for you. This information is not intended to replace advice given to you by your health care provider. Make sure you discuss  any questions you have with your health care provider. Document Released: 10/09/2000 Document Revised: 02/16/2017 Document Reviewed: 02/16/2017 Elsevier Interactive Patient Education  2019 [employer].

## 2018-12-12 NOTE — Interval H&P Note (Signed)
History and Physical Interval Note:  12/12/2018 7:19 AM  Robin Sherman  has presented today for surgery, with the diagnosis of right medial knee meniscal tear.  The various methods of treatment have been discussed with the patient and family. After consideration of risks, benefits and other options for treatment, the patient has consented to  Procedure(s): KNEE ARTHROSCOPY WITH MEDIAL MENISCECTOMY (Right) as a surgical intervention.  The patient's history has been reviewed, patient examined, no change in status, stable for surgery.  I have reviewed the patient's chart and labs.  Questions were answered to the patient's satisfaction.     Arther Abbott

## 2018-12-13 ENCOUNTER — Encounter (HOSPITAL_COMMUNITY): Payer: Self-pay | Admitting: Orthopedic Surgery

## 2018-12-19 DIAGNOSIS — Z9889 Other specified postprocedural states: Secondary | ICD-10-CM | POA: Insufficient documentation

## 2018-12-20 ENCOUNTER — Encounter: Payer: Self-pay | Admitting: Orthopedic Surgery

## 2018-12-20 ENCOUNTER — Ambulatory Visit (INDEPENDENT_AMBULATORY_CARE_PROVIDER_SITE_OTHER): Payer: BLUE CROSS/BLUE SHIELD | Admitting: Orthopedic Surgery

## 2018-12-20 ENCOUNTER — Other Ambulatory Visit: Payer: Self-pay

## 2018-12-20 DIAGNOSIS — Z9889 Other specified postprocedural states: Secondary | ICD-10-CM

## 2018-12-20 NOTE — Progress Notes (Signed)
Patient ID: Robin Sherman, female   DOB: 09-14-1960, 58 y.o.   MRN: [id number]  Chief Complaint  Patient presents with  . Routine Post Op    12/12/18 right knee scope     The patient is status post knee arthroscopy as described. They're doing well without any major complaints. Pain is well controlled. Incisions are clean, we removed the sutures  The patient will start physical therapy with home exercises  Follow-up 3 weeks  Encounter Diagnosis  Name Primary?  . Torn medial meniscus and chondroplasty medial femoral condyle for grade 4      Operative report  12/12/2018  8:42 AM  PATIENT:  [name]  58 y.o. female  PRE-OPERATIVE DIAGNOSIS:  right medial knee meniscal tear, osteoarthritis medial compartment, microfracture medial femoral condyle and tibial plateau POST-OPERATIVE DIAGNOSIS: Same as above  PROCEDURE:  Procedure(s): KNEE ARTHROSCOPY WITH MEDIAL MENISCECTOMY (Right)-29881 CHONDROPLASTY (Right)  SURGEON:  Surgeon(s) and Role:    Carole Civil, MD - Primary  Findings at surgery: Lateral compartment normal ACL PCL normal Extensive synovitis throughout the joint Mid body medial meniscus tear complex Grade 3 and 4 chondral degeneration medial femoral condyle involving the entire weightbearing surface with a grade 4 lesion of approximately 5 x 5 mm

## 2018-12-23 ENCOUNTER — Ambulatory Visit: Payer: BLUE CROSS/BLUE SHIELD | Admitting: Orthopedic Surgery

## 2019-01-10 ENCOUNTER — Other Ambulatory Visit: Payer: Self-pay

## 2019-01-10 ENCOUNTER — Encounter: Payer: Self-pay | Admitting: Orthopedic Surgery

## 2019-01-10 ENCOUNTER — Ambulatory Visit (INDEPENDENT_AMBULATORY_CARE_PROVIDER_SITE_OTHER): Payer: BC Managed Care – PPO | Admitting: Orthopedic Surgery

## 2019-01-10 DIAGNOSIS — M5440 Lumbago with sciatica, unspecified side: Secondary | ICD-10-CM | POA: Diagnosis not present

## 2019-01-10 DIAGNOSIS — Z9889 Other specified postprocedural states: Secondary | ICD-10-CM | POA: Diagnosis not present

## 2019-01-10 MED ORDER — HYDROCODONE-ACETAMINOPHEN 5-325 MG PO TABS: 1.0000 | ORAL_TABLET | Freq: Four times a day (QID) | ORAL | 0 refills | Status: DC | PRN

## 2019-01-10 NOTE — Progress Notes (Signed)
Chief Complaint  Patient presents with  . Routine Post Op    12/12/18 right knee scope can not walk with out walker     Postop status post knee arthroscopy torn medial meniscus chondroplasty medial femoral condyle..  Patient says she cannot walk without her walker 29 days after surgery  Examination reveals no swelling or effusion around the right knee she has mild tenderness on the medial lateral joint line consistent with her surgery  She has excellent extension she has 5 out of 5 manual muscle testing against resistance her flexion is 115 degrees  Previous spine film shows that she has disc disease she was seen in March for low back pain with sciatica  Review of Systems  Constitutional: Negative for fever.  Respiratory: Negative for shortness of breath.   Cardiovascular: Negative for chest pain.  Skin: Negative.   Neurological: Negative for tingling and sensory change.   Physical Exam Vitals signs and nursing note reviewed.  Constitutional:      Appearance: Normal appearance.  Neurological:     Mental Status: She is alert and oriented to person, place, and time.  Psychiatric:        Mood and Affect: Mood normal.    She can ambulate with a walker but has a lot of giving way symptoms intermittently in the right leg. Strength manual muscle testing right knee 5 out of 5 Range of motion 115 degrees of flexion Tenderness medial lateral joint line without effusion Neurovascular exam otherwise intact No peripheral edema   Encounter Diagnoses  Name Primary?  . S/P right knee arthroscopy 12/12/18 *with chondroplasty patella    . Low back pain with sciatica, sciatica laterality unspecified, unspecified back pain laterality, unspecified chronicity Yes    Meds ordered this encounter  Medications  . HYDROcodone-acetaminophen (NORCO/VICODIN) 5-325 MG tablet    Sig: Take 1 tablet by mouth every 6 (six) hours as needed for moderate pain.    Dispense:  30 tablet    Refill:  0     Knee scope and knee are doing fine however, this giving way and inability to stand on her leg is causing the most dysfunction and she has no ligament damage or muscle weakness to account for it  Start physical therapy lumbar spine  Out of work 6 weeks  Medicine for pain  Follow-up 6 weeks MRI if no improvement with therapy on the back

## 2019-01-10 NOTE — Patient Instructions (Signed)
NO WORK 6 WEEKS   THERAPY LUMBAR 6 WEEKS

## 2019-01-17 DIAGNOSIS — M25561 Pain in right knee: Secondary | ICD-10-CM | POA: Diagnosis not present

## 2019-01-17 DIAGNOSIS — R262 Difficulty in walking, not elsewhere classified: Secondary | ICD-10-CM | POA: Diagnosis not present

## 2019-01-17 DIAGNOSIS — M545 Low back pain: Secondary | ICD-10-CM | POA: Diagnosis not present

## 2019-01-17 DIAGNOSIS — M25661 Stiffness of right knee, not elsewhere classified: Secondary | ICD-10-CM | POA: Diagnosis not present

## 2019-01-21 DIAGNOSIS — M545 Low back pain: Secondary | ICD-10-CM | POA: Diagnosis not present

## 2019-01-21 DIAGNOSIS — M25661 Stiffness of right knee, not elsewhere classified: Secondary | ICD-10-CM | POA: Diagnosis not present

## 2019-01-21 DIAGNOSIS — R262 Difficulty in walking, not elsewhere classified: Secondary | ICD-10-CM | POA: Diagnosis not present

## 2019-01-21 DIAGNOSIS — M25561 Pain in right knee: Secondary | ICD-10-CM | POA: Diagnosis not present

## 2019-01-24 DIAGNOSIS — M25661 Stiffness of right knee, not elsewhere classified: Secondary | ICD-10-CM | POA: Diagnosis not present

## 2019-01-24 DIAGNOSIS — M545 Low back pain: Secondary | ICD-10-CM | POA: Diagnosis not present

## 2019-01-24 DIAGNOSIS — R262 Difficulty in walking, not elsewhere classified: Secondary | ICD-10-CM | POA: Diagnosis not present

## 2019-01-24 DIAGNOSIS — M25561 Pain in right knee: Secondary | ICD-10-CM | POA: Diagnosis not present

## 2019-01-28 DIAGNOSIS — R262 Difficulty in walking, not elsewhere classified: Secondary | ICD-10-CM | POA: Diagnosis not present

## 2019-01-28 DIAGNOSIS — M545 Low back pain: Secondary | ICD-10-CM | POA: Diagnosis not present

## 2019-01-28 DIAGNOSIS — M25661 Stiffness of right knee, not elsewhere classified: Secondary | ICD-10-CM | POA: Diagnosis not present

## 2019-01-28 DIAGNOSIS — M25561 Pain in right knee: Secondary | ICD-10-CM | POA: Diagnosis not present

## 2019-02-03 DIAGNOSIS — E782 Mixed hyperlipidemia: Secondary | ICD-10-CM | POA: Diagnosis not present

## 2019-02-03 DIAGNOSIS — R7301 Impaired fasting glucose: Secondary | ICD-10-CM | POA: Diagnosis not present

## 2019-02-03 DIAGNOSIS — J4520 Mild intermittent asthma, uncomplicated: Secondary | ICD-10-CM | POA: Diagnosis not present

## 2019-02-03 DIAGNOSIS — I10 Essential (primary) hypertension: Secondary | ICD-10-CM | POA: Diagnosis not present

## 2019-02-03 DIAGNOSIS — Z0189 Encounter for other specified special examinations: Secondary | ICD-10-CM | POA: Diagnosis not present

## 2019-02-03 DIAGNOSIS — R945 Abnormal results of liver function studies: Secondary | ICD-10-CM | POA: Diagnosis not present

## 2019-02-07 DIAGNOSIS — I10 Essential (primary) hypertension: Secondary | ICD-10-CM | POA: Diagnosis not present

## 2019-02-07 DIAGNOSIS — J4520 Mild intermittent asthma, uncomplicated: Secondary | ICD-10-CM | POA: Diagnosis not present

## 2019-02-07 DIAGNOSIS — Z0001 Encounter for general adult medical examination with abnormal findings: Secondary | ICD-10-CM | POA: Diagnosis not present

## 2019-02-07 DIAGNOSIS — Z0189 Encounter for other specified special examinations: Secondary | ICD-10-CM | POA: Diagnosis not present

## 2019-02-07 DIAGNOSIS — R945 Abnormal results of liver function studies: Secondary | ICD-10-CM | POA: Diagnosis not present

## 2019-02-07 DIAGNOSIS — E782 Mixed hyperlipidemia: Secondary | ICD-10-CM | POA: Diagnosis not present

## 2019-02-07 DIAGNOSIS — Z124 Encounter for screening for malignant neoplasm of cervix: Secondary | ICD-10-CM | POA: Diagnosis not present

## 2019-02-11 DIAGNOSIS — R262 Difficulty in walking, not elsewhere classified: Secondary | ICD-10-CM | POA: Diagnosis not present

## 2019-02-11 DIAGNOSIS — M545 Low back pain: Secondary | ICD-10-CM | POA: Diagnosis not present

## 2019-02-11 DIAGNOSIS — M25661 Stiffness of right knee, not elsewhere classified: Secondary | ICD-10-CM | POA: Diagnosis not present

## 2019-02-11 DIAGNOSIS — M25561 Pain in right knee: Secondary | ICD-10-CM | POA: Diagnosis not present

## 2019-02-12 DIAGNOSIS — M25561 Pain in right knee: Secondary | ICD-10-CM | POA: Diagnosis not present

## 2019-02-12 DIAGNOSIS — M545 Low back pain: Secondary | ICD-10-CM | POA: Diagnosis not present

## 2019-02-12 DIAGNOSIS — M25661 Stiffness of right knee, not elsewhere classified: Secondary | ICD-10-CM | POA: Diagnosis not present

## 2019-02-12 DIAGNOSIS — R262 Difficulty in walking, not elsewhere classified: Secondary | ICD-10-CM | POA: Diagnosis not present

## 2019-02-18 DIAGNOSIS — R262 Difficulty in walking, not elsewhere classified: Secondary | ICD-10-CM | POA: Diagnosis not present

## 2019-02-18 DIAGNOSIS — M545 Low back pain: Secondary | ICD-10-CM | POA: Diagnosis not present

## 2019-02-18 DIAGNOSIS — M25561 Pain in right knee: Secondary | ICD-10-CM | POA: Diagnosis not present

## 2019-02-18 DIAGNOSIS — M25661 Stiffness of right knee, not elsewhere classified: Secondary | ICD-10-CM | POA: Diagnosis not present

## 2019-02-19 DIAGNOSIS — R262 Difficulty in walking, not elsewhere classified: Secondary | ICD-10-CM | POA: Diagnosis not present

## 2019-02-19 DIAGNOSIS — M25561 Pain in right knee: Secondary | ICD-10-CM | POA: Diagnosis not present

## 2019-02-19 DIAGNOSIS — M545 Low back pain: Secondary | ICD-10-CM | POA: Diagnosis not present

## 2019-02-19 DIAGNOSIS — M25661 Stiffness of right knee, not elsewhere classified: Secondary | ICD-10-CM | POA: Diagnosis not present

## 2019-02-21 ENCOUNTER — Ambulatory Visit (INDEPENDENT_AMBULATORY_CARE_PROVIDER_SITE_OTHER): Payer: BC Managed Care – PPO | Admitting: Orthopedic Surgery

## 2019-02-21 ENCOUNTER — Other Ambulatory Visit: Payer: Self-pay

## 2019-02-21 ENCOUNTER — Encounter: Payer: Self-pay | Admitting: Orthopedic Surgery

## 2019-02-21 VITALS — BP 147/86 | HR 100 | Temp 98.1°F | Ht 64.0 in | Wt 232.0 lb

## 2019-02-21 DIAGNOSIS — M545 Low back pain: Secondary | ICD-10-CM | POA: Diagnosis not present

## 2019-02-21 DIAGNOSIS — Z9889 Other specified postprocedural states: Secondary | ICD-10-CM | POA: Diagnosis not present

## 2019-02-21 DIAGNOSIS — M5440 Lumbago with sciatica, unspecified side: Secondary | ICD-10-CM | POA: Diagnosis not present

## 2019-02-21 DIAGNOSIS — M25561 Pain in right knee: Secondary | ICD-10-CM | POA: Diagnosis not present

## 2019-02-21 DIAGNOSIS — R262 Difficulty in walking, not elsewhere classified: Secondary | ICD-10-CM | POA: Diagnosis not present

## 2019-02-21 DIAGNOSIS — M25661 Stiffness of right knee, not elsewhere classified: Secondary | ICD-10-CM | POA: Diagnosis not present

## 2019-02-21 MED ORDER — MELOXICAM 7.5 MG PO TABS: 7.5000 mg | ORAL_TABLET | Freq: Every day | ORAL | 5 refills | Status: DC

## 2019-02-21 MED ORDER — HYDROCODONE-ACETAMINOPHEN 5-325 MG PO TABS: 1.0000 | ORAL_TABLET | Freq: Four times a day (QID) | ORAL | 0 refills | Status: AC | PRN

## 2019-02-21 NOTE — Progress Notes (Signed)
Chief Complaint  Patient presents with  . Follow-up    Right knee scope 12/12/18  . Leg Pain    lbp W/ LEG PAIN    Encounter Diagnoses  Name Primary?  . S/P right knee arthroscopy 12/12/18 *with chondroplasty patella  Yes  . Low back pain with sciatica, sciatica laterality unspecified, unspecified back pain laterality, unspecified chronicity     Tran comes in after completing a physical therapy trying Flexeril Sterapred Dosepak meloxicam hydrocodone but continues to have anteromedial knee pain from arthritis see arthroscopy report  But she is also having weightbearing pain radiating into her hip and back.  Review of Systems  Gastrointestinal: Negative.   Genitourinary: Negative.     Meds ordered this encounter  Medications  . meloxicam (MOBIC) 7.5 MG tablet    Sig: Take 1 tablet (7.5 mg total) by mouth daily.    Dispense:  30 tablet    Refill:  5  . HYDROcodone-acetaminophen (NORCO/VICODIN) 5-325 MG tablet    Sig: Take 1 tablet by mouth every 6 (six) hours as needed for up to 7 days for moderate pain.    Dispense:  28 tablet    Refill:  0   We will proceed now with MRI having met the criteria for imaging.  In the meantime she will continue on hydrocodone meloxicam gabapentin Robaxin

## 2019-02-21 NOTE — Patient Instructions (Addendum)
Take Meloxicam, Hydrocodone, Methocarbamol  FOR PAIN  Out of work 4 WEEKS

## 2019-02-24 DIAGNOSIS — M25561 Pain in right knee: Secondary | ICD-10-CM | POA: Diagnosis not present

## 2019-02-24 DIAGNOSIS — R262 Difficulty in walking, not elsewhere classified: Secondary | ICD-10-CM | POA: Diagnosis not present

## 2019-02-24 DIAGNOSIS — M545 Low back pain: Secondary | ICD-10-CM | POA: Diagnosis not present

## 2019-02-24 DIAGNOSIS — M25661 Stiffness of right knee, not elsewhere classified: Secondary | ICD-10-CM | POA: Diagnosis not present

## 2019-02-25 ENCOUNTER — Telehealth: Payer: Self-pay | Admitting: Radiology

## 2019-02-25 DIAGNOSIS — R262 Difficulty in walking, not elsewhere classified: Secondary | ICD-10-CM | POA: Diagnosis not present

## 2019-02-25 DIAGNOSIS — M545 Low back pain: Secondary | ICD-10-CM | POA: Diagnosis not present

## 2019-02-25 DIAGNOSIS — M25661 Stiffness of right knee, not elsewhere classified: Secondary | ICD-10-CM | POA: Diagnosis not present

## 2019-02-25 DIAGNOSIS — M25561 Pain in right knee: Secondary | ICD-10-CM | POA: Diagnosis not present

## 2019-02-25 NOTE — Telephone Encounter (Signed)
Called patient MRI scan is scheduled for 03/04/2019 [facility] does not require auth for out of state MRI

## 2019-02-28 DIAGNOSIS — M25661 Stiffness of right knee, not elsewhere classified: Secondary | ICD-10-CM | POA: Diagnosis not present

## 2019-02-28 DIAGNOSIS — M25561 Pain in right knee: Secondary | ICD-10-CM | POA: Diagnosis not present

## 2019-02-28 DIAGNOSIS — R262 Difficulty in walking, not elsewhere classified: Secondary | ICD-10-CM | POA: Diagnosis not present

## 2019-02-28 DIAGNOSIS — M545 Low back pain: Secondary | ICD-10-CM | POA: Diagnosis not present

## 2019-03-03 DIAGNOSIS — R262 Difficulty in walking, not elsewhere classified: Secondary | ICD-10-CM | POA: Diagnosis not present

## 2019-03-03 DIAGNOSIS — M545 Low back pain: Secondary | ICD-10-CM | POA: Diagnosis not present

## 2019-03-03 DIAGNOSIS — M25561 Pain in right knee: Secondary | ICD-10-CM | POA: Diagnosis not present

## 2019-03-03 DIAGNOSIS — M25661 Stiffness of right knee, not elsewhere classified: Secondary | ICD-10-CM | POA: Diagnosis not present

## 2019-03-04 ENCOUNTER — Other Ambulatory Visit: Payer: Self-pay

## 2019-03-04 ENCOUNTER — Ambulatory Visit (HOSPITAL_COMMUNITY)
Admission: RE | Admit: 2019-03-04 | Discharge: 2019-03-04 | Disposition: A | Discharge status: Home / Self Care | Payer: BC Managed Care – PPO | Source: Ambulatory Visit | Attending: Orthopedic Surgery | Admitting: Orthopedic Surgery

## 2019-03-04 DIAGNOSIS — M5440 Lumbago with sciatica, unspecified side: Secondary | ICD-10-CM | POA: Insufficient documentation

## 2019-03-04 DIAGNOSIS — M545 Low back pain: Secondary | ICD-10-CM | POA: Diagnosis not present

## 2019-03-05 DIAGNOSIS — R262 Difficulty in walking, not elsewhere classified: Secondary | ICD-10-CM | POA: Diagnosis not present

## 2019-03-05 DIAGNOSIS — M545 Low back pain: Secondary | ICD-10-CM | POA: Diagnosis not present

## 2019-03-05 DIAGNOSIS — M25561 Pain in right knee: Secondary | ICD-10-CM | POA: Diagnosis not present

## 2019-03-05 DIAGNOSIS — M25661 Stiffness of right knee, not elsewhere classified: Secondary | ICD-10-CM | POA: Diagnosis not present

## 2019-03-11 ENCOUNTER — Other Ambulatory Visit: Payer: Self-pay | Admitting: Orthopedic Surgery

## 2019-03-11 ENCOUNTER — Telehealth: Payer: Self-pay | Admitting: Orthopedic Surgery

## 2019-03-11 ENCOUNTER — Telehealth: Payer: Self-pay | Admitting: Radiology

## 2019-03-11 DIAGNOSIS — M5440 Lumbago with sciatica, unspecified side: Secondary | ICD-10-CM

## 2019-03-11 MED ORDER — GABAPENTIN 100 MG PO CAPS: 100.0000 mg | ORAL_CAPSULE | Freq: Three times a day (TID) | ORAL | 2 refills | Status: DC

## 2019-03-11 MED ORDER — HYDROCODONE-ACETAMINOPHEN 5-325 MG PO TABS: 1.0000 | ORAL_TABLET | Freq: Four times a day (QID) | ORAL | 0 refills | Status: AC | PRN

## 2019-03-11 NOTE — Telephone Encounter (Signed)
L3-4: Moderate facet arthropathy. Shallow broad-based right paracentral protrusion causes mild narrowing in the right subarticular recess. Neural foramina are open.  L4-5: Moderate facet arthropathy and a shallow broad-based disc bulge without stenosis.  L5-S1: Mild-to-moderate facet degenerative change. Shallow broad-based disc bulge is more prominent to the right extending into the foramen. There is moderate right foraminal narrowing. The central canal and left foramen are open. Prominent epidural fat noted.  IMPRESSION: Disc bulge L2-3 causes narrowing in the subarticular recesses which could impact either descending L3 root. There is also mild to moderate bilateral foraminal narrowing at L2-3, worse on the left.  Mild narrowing in the right subarticular recess at L3-4 due to a shallow right paracentral protrusion.  Moderate right foraminal narrowing at L5-S1 due to a combination disc and facet arthropathy. The   Electronically Signed   By: Inge Rise M.D.   On: 03/04/2019 18:52   Advised to see neurosurgery  Patient says she has no income has to go back to work requested some pain medication for her back  I emphasized the need to see the neurosurgeon secondary to risk of potential permanent muscle damage

## 2019-03-11 NOTE — Telephone Encounter (Signed)
-----   Message from Carole Civil, MD sent at 03/11/2019 12:35 PM EDT ----- Refer to neurosurgery

## 2019-03-13 DIAGNOSIS — M545 Low back pain: Secondary | ICD-10-CM | POA: Diagnosis not present

## 2019-03-13 DIAGNOSIS — R262 Difficulty in walking, not elsewhere classified: Secondary | ICD-10-CM | POA: Diagnosis not present

## 2019-03-13 DIAGNOSIS — M25661 Stiffness of right knee, not elsewhere classified: Secondary | ICD-10-CM | POA: Diagnosis not present

## 2019-03-13 DIAGNOSIS — M25561 Pain in right knee: Secondary | ICD-10-CM | POA: Diagnosis not present

## 2019-03-27 ENCOUNTER — Telehealth: Payer: Self-pay | Admitting: Orthopedic Surgery

## 2019-03-27 NOTE — Telephone Encounter (Signed)
Call returned to patient per voice message; requests appointment for knee injection, scheduled for next available. Also aware of appointment scheduled per referral, [facility] 05/21/19. Asking if Dr Aline Brochure would prescribe Hydrocodone or Percocet for her pain. [facility] is pharmacy if medication prescribed.

## 2019-03-28 NOTE — Telephone Encounter (Signed)
All prescription must come thru pharmacy

## 2019-04-09 DIAGNOSIS — M5440 Lumbago with sciatica, unspecified side: Secondary | ICD-10-CM | POA: Diagnosis not present

## 2019-04-09 DIAGNOSIS — Z23 Encounter for immunization: Secondary | ICD-10-CM | POA: Diagnosis not present

## 2019-04-09 DIAGNOSIS — I10 Essential (primary) hypertension: Secondary | ICD-10-CM | POA: Diagnosis not present

## 2019-04-09 DIAGNOSIS — M1611 Unilateral primary osteoarthritis, right hip: Secondary | ICD-10-CM | POA: Diagnosis not present

## 2019-04-09 DIAGNOSIS — M545 Low back pain: Secondary | ICD-10-CM | POA: Diagnosis not present

## 2019-04-09 DIAGNOSIS — Z6841 Body Mass Index (BMI) 40.0 and over, adult: Secondary | ICD-10-CM | POA: Diagnosis not present

## 2019-04-09 DIAGNOSIS — M5416 Radiculopathy, lumbar region: Secondary | ICD-10-CM | POA: Diagnosis not present

## 2019-04-10 ENCOUNTER — Other Ambulatory Visit: Payer: Self-pay | Admitting: Neurological Surgery

## 2019-04-10 DIAGNOSIS — M5416 Radiculopathy, lumbar region: Secondary | ICD-10-CM

## 2019-04-16 ENCOUNTER — Ambulatory Visit: Payer: BC Managed Care – PPO | Admitting: Orthopedic Surgery

## 2019-04-23 ENCOUNTER — Ambulatory Visit (HOSPITAL_COMMUNITY)
Admission: RE | Admit: 2019-04-23 | Discharge: 2019-04-23 | Disposition: A | Discharge status: Home / Self Care | Payer: BC Managed Care – PPO | Source: Ambulatory Visit | Attending: Neurological Surgery | Admitting: Neurological Surgery

## 2019-04-23 ENCOUNTER — Other Ambulatory Visit: Payer: Self-pay

## 2019-04-23 DIAGNOSIS — M5126 Other intervertebral disc displacement, lumbar region: Secondary | ICD-10-CM | POA: Diagnosis not present

## 2019-04-23 DIAGNOSIS — M5416 Radiculopathy, lumbar region: Secondary | ICD-10-CM | POA: Diagnosis not present

## 2019-04-28 ENCOUNTER — Other Ambulatory Visit: Payer: Self-pay

## 2019-04-28 ENCOUNTER — Encounter: Payer: Self-pay | Admitting: Orthopedic Surgery

## 2019-04-28 ENCOUNTER — Ambulatory Visit: Payer: BC Managed Care – PPO | Admitting: Orthopedic Surgery

## 2019-04-28 VITALS — BP 136/92 | HR 101 | Temp 96.9°F | Ht 64.0 in | Wt 230.0 lb

## 2019-04-28 DIAGNOSIS — M5136 Other intervertebral disc degeneration, lumbar region: Secondary | ICD-10-CM | POA: Diagnosis not present

## 2019-04-28 DIAGNOSIS — M1711 Unilateral primary osteoarthritis, right knee: Secondary | ICD-10-CM

## 2019-04-28 DIAGNOSIS — M51369 Other intervertebral disc degeneration, lumbar region without mention of lumbar back pain or lower extremity pain: Secondary | ICD-10-CM

## 2019-04-28 DIAGNOSIS — Z9889 Other specified postprocedural states: Secondary | ICD-10-CM

## 2019-04-28 DIAGNOSIS — M1710 Unilateral primary osteoarthritis, unspecified knee: Secondary | ICD-10-CM

## 2019-04-28 MED ORDER — HYDROCODONE-ACETAMINOPHEN 7.5-325 MG PO TABS: 1.0000 | ORAL_TABLET | Freq: Three times a day (TID) | ORAL | 0 refills | Status: DC | PRN

## 2019-04-28 MED ORDER — GABAPENTIN 300 MG PO CAPS: 300.0000 mg | ORAL_CAPSULE | Freq: Three times a day (TID) | ORAL | 5 refills | Status: DC

## 2019-04-28 NOTE — Progress Notes (Signed)
Progress Note   Patient ID: Robin Sherman, female   DOB: 11-Sep-1960, 58 y.o.   MRN: [id number]   Chief Complaint  Patient presents with  . Follow-up    Recheck o right knee, DOS 12-12-18.    Encounter Diagnoses  Name Primary?  . DDD (degenerative disc disease), lumbar Yes  . Primary localized osteoarthritis of knee   . S/P right knee arthroscopy 12/12/18 *with chondroplasty patella      58 year old female status post right knee arthroscopy presented after arthroscopy with radicular leg pain work-up included MRI which showed a disc condition.  She was sent to neurosurgery who ordered a CAT scan on October 8 she presents today with right lower back pain right leg pain and right knee pain    Review of Systems  Gastrointestinal: Negative.   Genitourinary: Negative.   Neurological: Negative for focal weakness.      BP (!) 136/92   Pulse (!) 101   Temp (!) 96.9 F (36.1 C)   Ht 5\' 4"  (1.626 m)   Wt 230 lb (104.3 kg)   LMP 04/15/2013   BMI 39.48 kg/m   Physical Exam Vitals signs and nursing note reviewed.  Constitutional:      Appearance: Normal appearance.  Musculoskeletal:     Comments: Antalgic right lower extremity gait with tenderness along the medial joint line no effusion normal range of motion and slight varus appearing alignment to the right knee  Neurological:     Mental Status: She is alert and oriented to person, place, and time.  Psychiatric:        Mood and Affect: Mood normal.      Medical decisions:  (Established problem worse, x-ray ,physical therapy, over-the-counter medicines, read outside film or summarize x-ray)  Data  Imaging:   No new imaging  Encounter Diagnoses  Name Primary?  . DDD (degenerative disc disease), lumbar Yes  . Primary localized osteoarthritis of knee   . S/P right knee arthroscopy 12/12/18 *with chondroplasty patella      PLAN:   Inject right knee  I increased her gabapentin I refilled her hydrocodone she is now up  to 7.5 mg She is to continue Robaxin and meloxicam  I asked her to call the neurosurgeon today to check on the results of the CAT scan  Meds ordered this encounter  Medications  . DISCONTD: HYDROcodone-acetaminophen (NORCO) 7.5-325 MG tablet    Sig: Take 1 tablet by mouth every 8 (eight) hours as needed for moderate pain.    Dispense:  42 tablet    Refill:  0  . gabapentin (NEURONTIN) 300 MG capsule    Sig: Take 1 capsule (300 mg total) by mouth 3 (three) times daily.    Dispense:  90 capsule    Refill:  5  . HYDROcodone-acetaminophen (NORCO) 7.5-325 MG tablet    Sig: Take 1 tablet by mouth every 8 (eight) hours as needed for moderate pain.    Dispense:  42 tablet    Refill:  0       Arther Abbott, MD 04/28/2019 10:52 AM

## 2019-04-28 NOTE — Progress Notes (Signed)
Chief Complaint  Patient presents with  . Follow-up    Recheck o right knee, DOS 12-12-18.    Procedure note right knee injection   verbal consent was obtained to inject right knee joint  Timeout was completed to confirm the site of injection  The medications used were 40 mg of Depo-Medrol and 1% lidocaine 3 cc  Anesthesia was provided by ethyl chloride and the skin was prepped with alcohol.  After cleaning the skin with alcohol a 20-gauge needle was used to inject the right knee joint. There were no complications. A sterile bandage was applied.   No orders of the defined types were placed in this encounter.

## 2019-04-28 NOTE — Patient Instructions (Addendum)
meloxicam Robaxin Hydrocodone Increase gabapentin  Call Dr Zada Finders today

## 2019-05-16 DIAGNOSIS — M5416 Radiculopathy, lumbar region: Secondary | ICD-10-CM | POA: Diagnosis not present

## 2019-05-31 DIAGNOSIS — M5430 Sciatica, unspecified side: Secondary | ICD-10-CM | POA: Diagnosis not present

## 2019-05-31 DIAGNOSIS — M545 Low back pain: Secondary | ICD-10-CM | POA: Diagnosis not present

## 2019-06-23 DIAGNOSIS — M5416 Radiculopathy, lumbar region: Secondary | ICD-10-CM | POA: Diagnosis not present

## 2019-08-14 DIAGNOSIS — Z20828 Contact with and (suspected) exposure to other viral communicable diseases: Secondary | ICD-10-CM | POA: Diagnosis not present

## 2019-08-14 DIAGNOSIS — M791 Myalgia, unspecified site: Secondary | ICD-10-CM | POA: Diagnosis not present

## 2019-09-01 DIAGNOSIS — M5416 Radiculopathy, lumbar region: Secondary | ICD-10-CM | POA: Diagnosis not present

## 2019-09-02 ENCOUNTER — Telehealth: Payer: Self-pay | Admitting: *Deleted

## 2019-09-02 NOTE — Telephone Encounter (Signed)
Received call from patient.   Requested information on how patient can return to practice as her husband is still a patient here.   Advised that she was dismissed from practice due to her bill. Inquired as to why her bill was so high (>$900), and if she can make payments.   Advised I will have office manager return her call to discuss.

## 2019-09-03 NOTE — Telephone Encounter (Signed)
It pt willing to set up payment plan, okay to schedule

## 2019-09-03 NOTE — Telephone Encounter (Signed)
Dr. Buelah Manis please advise, patient was last seen in 04/2016 she would be considered a new patient.   Patient contacted office requesting to make payment arrangements on her outstanding balance of $914.80 this outstanding balance is coming from 2016-2017. She states that she will come by on 09/05/2019 and start paying $100 every two weeks until this balance is paid in full.   CB# [phone]

## 2019-09-12 NOTE — Telephone Encounter (Signed)
I tried to call patient but voicemail said Robin Sherman I didn't leave a message I will try again later.

## 2019-09-25 NOTE — Telephone Encounter (Signed)
Patient came in and made another payment on account. She is going to pay $100 until her account is paid in full. I went ahead and scheduled her an appointment.

## 2019-09-26 DIAGNOSIS — M25559 Pain in unspecified hip: Secondary | ICD-10-CM | POA: Diagnosis not present

## 2019-09-26 DIAGNOSIS — M5416 Radiculopathy, lumbar region: Secondary | ICD-10-CM | POA: Diagnosis not present

## 2019-10-20 ENCOUNTER — Telehealth: Payer: Self-pay | Admitting: Orthopedic Surgery

## 2019-10-20 NOTE — Telephone Encounter (Signed)
Patient's short term disability insurer [employer], per [facility] at [phone], called to verify our contact information for forms which are forthcoming; patient aware.

## 2019-10-22 ENCOUNTER — Other Ambulatory Visit: Payer: Self-pay

## 2019-10-22 ENCOUNTER — Ambulatory Visit: Payer: BC Managed Care – PPO | Admitting: Orthopedic Surgery

## 2019-10-22 ENCOUNTER — Encounter: Payer: Self-pay | Admitting: Orthopedic Surgery

## 2019-10-22 VITALS — Temp 99.1°F | Ht 64.0 in | Wt 220.0 lb

## 2019-10-22 DIAGNOSIS — Z9889 Other specified postprocedural states: Secondary | ICD-10-CM

## 2019-10-22 DIAGNOSIS — M25561 Pain in right knee: Secondary | ICD-10-CM | POA: Diagnosis not present

## 2019-10-22 DIAGNOSIS — M5440 Lumbago with sciatica, unspecified side: Secondary | ICD-10-CM

## 2019-10-22 MED ORDER — MELOXICAM 7.5 MG PO TABS: 7.5000 mg | ORAL_TABLET | Freq: Every day | ORAL | 5 refills | Status: DC

## 2019-10-22 MED ORDER — METHYLPREDNISOLONE ACETATE 40 MG/ML IJ SUSP
40.0000 mg | Freq: Once | INTRAMUSCULAR | Status: AC
  Administered 2019-10-22: 40 mg via INTRAMUSCULAR

## 2019-10-22 MED ORDER — GABAPENTIN 300 MG PO CAPS: 300.0000 mg | ORAL_CAPSULE | Freq: Three times a day (TID) | ORAL | 5 refills | Status: DC

## 2019-10-22 MED ORDER — HYDROCODONE-ACETAMINOPHEN 5-325 MG PO TABS: 1.0000 | ORAL_TABLET | Freq: Four times a day (QID) | ORAL | 0 refills | Status: DC | PRN

## 2019-10-22 NOTE — Progress Notes (Signed)
Chief Complaint  Patient presents with  . Knee Pain    Recheck on right knee/ right leg pain    Encounter Diagnoses  Name Primary?  . S/P right knee arthroscopy 12/12/18 *with chondroplasty patella  Yes  . Low back pain with sciatica, sciatica laterality unspecified, unspecified back pain laterality, unspecified chronicity   . Right knee pain, unspecified chronicity     Chief complaint is radicular pain right leg with lower back pain  History of degenerative disc disease spinal stenosis status post injections lumbar spine did not help  Right leg still gives way  On gabapentin it causes some dizziness and sleepiness  I reviewed her MRI scan again talked to her about possibly going for repeat injections or seeing the spine surgeon but she does not want to have back surgery  So I am going to change her gabapentin to 300 mg in the morning 300 mg 2 before bed put her back on Norco and meloxicam  Meds ordered this encounter  Medications  . meloxicam (MOBIC) 7.5 MG tablet    Sig: Take 1 tablet (7.5 mg total) by mouth daily.    Dispense:  30 tablet    Refill:  5  . gabapentin (NEURONTIN) 300 MG capsule    Sig: Take 1 capsule (300 mg total) by mouth 3 (three) times daily.    Dispense:  90 capsule    Refill:  5  . HYDROcodone-acetaminophen (NORCO/VICODIN) 5-325 MG tablet    Sig: Take 1 tablet by mouth every 6 (six) hours as needed for moderate pain.    Dispense:  30 tablet    Refill:  0  . methylPREDNISolone acetate (DEPO-MEDROL) injection 40 mg   Intramuscular injection procedure note  The patient has complained of pain in the right gluteal  We have decided that the best treatment option to give the patient pain relief is to do an intramuscular injection of Depo-Medrol   The patient gave verbal consent, timeout was taken as appropriate. The nurse injected    40Mg  of Depo-Medrol into the  Hip/gluteal    Return as needed

## 2019-10-22 NOTE — Patient Instructions (Signed)
Take gabapentin 1 in the am and 2 before bedtime   Meds ordered this encounter  Medications  . meloxicam (MOBIC) 7.5 MG tablet    Sig: Take 1 tablet (7.5 mg total) by mouth daily.    Dispense:  30 tablet    Refill:  5  . gabapentin (NEURONTIN) 300 MG capsule    Sig: Take 1 capsule (300 mg total) by mouth 3 (three) times daily.    Dispense:  90 capsule    Refill:  5  . HYDROcodone-acetaminophen (NORCO/VICODIN) 5-325 MG tablet    Sig: Take 1 tablet by mouth every 6 (six) hours as needed for moderate pain.    Dispense:  30 tablet    Refill:  0

## 2019-10-23 ENCOUNTER — Telehealth: Payer: Self-pay | Admitting: Orthopedic Surgery

## 2019-10-23 DIAGNOSIS — M5440 Lumbago with sciatica, unspecified side: Secondary | ICD-10-CM

## 2019-10-23 DIAGNOSIS — M5136 Other intervertebral disc degeneration, lumbar region: Secondary | ICD-10-CM

## 2019-10-23 NOTE — Telephone Encounter (Signed)
Robin Sherman called back today and states she does wants [place] to refer her to the spine surgeon.  Would you please take care of the referral for her?  Thanks

## 2019-10-24 NOTE — Telephone Encounter (Signed)
Okay to start referral process

## 2019-10-24 NOTE — Telephone Encounter (Signed)
Please see below, ok to refer to [facility] in [place]? Or elsewhere?

## 2019-11-11 ENCOUNTER — Telehealth: Payer: Self-pay | Admitting: Orthopedic Surgery

## 2019-11-11 ENCOUNTER — Telehealth: Payer: Self-pay | Admitting: Radiology

## 2019-11-11 NOTE — Telephone Encounter (Signed)
I spoke to patient about the referral. She states she is not having pain any longer after injection in the office  She would like for [place] to cancel the referral, to Neurosurgery,  she will let us know if she changes her mind and we can re open.    To you FYI

## 2019-11-11 NOTE — Telephone Encounter (Signed)
Update faxed to [employer] fax# [phone] per request / ref# [id number] - authorization on file; patient aware.

## 2019-11-25 ENCOUNTER — Telehealth: Payer: Self-pay | Admitting: Orthopedic Surgery

## 2019-11-25 NOTE — Telephone Encounter (Signed)
Patient's short-term disability insurer, [employer], have sent a new request for updated information; asking for time out of work for her visit on 04/28/2019, in reference to her intermittent Fmla form.

## 2019-11-26 ENCOUNTER — Encounter: Payer: BLUE CROSS/BLUE SHIELD | Admitting: Family Medicine

## 2019-12-05 NOTE — Telephone Encounter (Signed)
Can patient's Date of service 04/28/2019 intermittent FMLA form be included in the current request for intermittent FMLA leave, for flare-up or for medical appointment? Please advise.

## 2019-12-05 NOTE — Telephone Encounter (Signed)
yes

## 2019-12-05 NOTE — Telephone Encounter (Signed)
Notified [employer], attention Chauncy Passy, ph [phone] to [phone]

## 2019-12-08 ENCOUNTER — Other Ambulatory Visit: Payer: Self-pay

## 2019-12-08 ENCOUNTER — Ambulatory Visit (INDEPENDENT_AMBULATORY_CARE_PROVIDER_SITE_OTHER): Payer: BC Managed Care – PPO | Admitting: Family Medicine

## 2019-12-08 ENCOUNTER — Encounter: Payer: Self-pay | Admitting: Family Medicine

## 2019-12-08 VITALS — BP 134/72 | HR 100 | Temp 97.9°F | Resp 14 | Ht 64.0 in | Wt 221.0 lb

## 2019-12-08 DIAGNOSIS — I10 Essential (primary) hypertension: Secondary | ICD-10-CM

## 2019-12-08 DIAGNOSIS — M5136 Other intervertebral disc degeneration, lumbar region: Secondary | ICD-10-CM

## 2019-12-08 DIAGNOSIS — M170 Bilateral primary osteoarthritis of knee: Secondary | ICD-10-CM | POA: Diagnosis not present

## 2019-12-08 DIAGNOSIS — G4700 Insomnia, unspecified: Secondary | ICD-10-CM

## 2019-12-08 DIAGNOSIS — Z1211 Encounter for screening for malignant neoplasm of colon: Secondary | ICD-10-CM

## 2019-12-08 DIAGNOSIS — M51369 Other intervertebral disc degeneration, lumbar region without mention of lumbar back pain or lower extremity pain: Secondary | ICD-10-CM | POA: Insufficient documentation

## 2019-12-08 DIAGNOSIS — Z0001 Encounter for general adult medical examination with abnormal findings: Secondary | ICD-10-CM

## 2019-12-08 DIAGNOSIS — Z0000 Encounter for general adult medical examination without abnormal findings: Secondary | ICD-10-CM

## 2019-12-08 DIAGNOSIS — Z1231 Encounter for screening mammogram for malignant neoplasm of breast: Secondary | ICD-10-CM

## 2019-12-08 DIAGNOSIS — E559 Vitamin D deficiency, unspecified: Secondary | ICD-10-CM

## 2019-12-08 DIAGNOSIS — R7303 Prediabetes: Secondary | ICD-10-CM

## 2019-12-08 MED ORDER — OXYCODONE-ACETAMINOPHEN 10-325 MG PO TABS: 1.0000 | ORAL_TABLET | Freq: Two times a day (BID) | ORAL | 0 refills | Status: DC

## 2019-12-08 MED ORDER — PREGABALIN 75 MG PO CAPS: 75.0000 mg | ORAL_CAPSULE | Freq: Two times a day (BID) | ORAL | 2 refills | Status: DC

## 2019-12-08 NOTE — Patient Instructions (Addendum)
Schedule mammogram  Referral to GI for colonoscopy  We will call with lab results Try lyrica take 75mg  BID, Decrease gabapentin to 300mg  twice a day for 2 days, then 300mg  at bedtime for 2 days, then stop Then start the lyrica  Pain medication refilled

## 2019-12-08 NOTE — Assessment & Plan Note (Signed)
Controlled no changes Discussed dietary changes to help reduce weight further She wants to come off meds
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
## 2019-12-08 NOTE — Assessment & Plan Note (Signed)
Per above pt with narcotic and controlled substance of lyrica  Would not recommend adding ambien Will check labs, see how she does with above med changes In future would try trazodone or doxepin, amtitryptiline

## 2019-12-08 NOTE — Assessment & Plan Note (Signed)
Refill oxycodone-acetaminophen #30 tabs Will try her on lyrica, start 75mg  BID Taper given for gabapentin, reduce to 300mg  BID x 2 days, then 300mg  at bedtime for 2 days, then stop and start lyrica

## 2019-12-08 NOTE — Telephone Encounter (Signed)
Done as of 12/05/19; faxed to requesting insurer. Patient aware.

## 2019-12-09 LAB — CBC WITH DIFFERENTIAL/PLATELET
Absolute Monocytes: 540 cells/uL (ref 200–950)
Basophils Absolute: 63 cells/uL (ref 0–200)
Basophils Relative: 0.7 %
Eosinophils Absolute: 567 cells/uL — ABNORMAL HIGH (ref 15–500)
Eosinophils Relative: 6.3 %
HCT: 41.6 % (ref 35.0–45.0)
Hemoglobin: 13.8 g/dL (ref 11.7–15.5)
Lymphs Abs: 3177 cells/uL (ref 850–3900)
MCH: 28.3 pg (ref 27.0–33.0)
MCHC: 33.2 g/dL (ref 32.0–36.0)
MCV: 85.4 fL (ref 80.0–100.0)
MPV: 10.5 fL (ref 7.5–12.5)
Monocytes Relative: 6 %
Neutro Abs: 4653 cells/uL (ref 1500–7800)
Neutrophils Relative %: 51.7 %
Platelets: 422 10*3/uL — ABNORMAL HIGH (ref 140–400)
RBC: 4.87 10*6/uL (ref 3.80–5.10)
RDW: 13.7 % (ref 11.0–15.0)
Total Lymphocyte: 35.3 %
WBC: 9 10*3/uL (ref 3.8–10.8)

## 2019-12-09 LAB — LIPID PANEL
Cholesterol: 186 mg/dL (ref <200)
HDL: 56 mg/dL (ref >=50)
LDL Cholesterol (Calc): 113 mg/dL (calc) — ABNORMAL HIGH
Non-HDL Cholesterol (Calc): 130 mg/dL (calc) — ABNORMAL HIGH (ref <130)
Total CHOL/HDL Ratio: 3.3 (calc) (ref <5.0)
Triglycerides: 71 mg/dL (ref <150)

## 2019-12-09 LAB — COMPREHENSIVE METABOLIC PANEL
AG Ratio: 1.3 (calc) (ref 1.0–2.5)
ALT: 51 U/L — ABNORMAL HIGH (ref 6–29)
AST: 45 U/L — ABNORMAL HIGH (ref 10–35)
Albumin: 4.4 g/dL (ref 3.6–5.1)
Alkaline phosphatase (APISO): 141 U/L (ref 37–153)
BUN: 11 mg/dL (ref 7–25)
CO2: 25 mmol/L (ref 20–32)
Calcium: 10.1 mg/dL (ref 8.6–10.4)
Chloride: 103 mmol/L (ref 98–110)
Creat: 0.71 mg/dL (ref 0.50–1.05)
Globulin: 3.5 g/dL (calc) (ref 1.9–3.7)
Glucose, Bld: 96 mg/dL (ref 65–99)
Potassium: 4.5 mmol/L (ref 3.5–5.3)
Sodium: 140 mmol/L (ref 135–146)
Total Bilirubin: 0.3 mg/dL (ref 0.2–1.2)
Total Protein: 7.9 g/dL (ref 6.1–8.1)

## 2019-12-09 LAB — VITAMIN D 25 HYDROXY (VIT D DEFICIENCY, FRACTURES): Vit D, 25-Hydroxy: 23 ng/mL — ABNORMAL LOW (ref 30–100)

## 2019-12-09 LAB — HEMOGLOBIN A1C
Hgb A1c MFr Bld: 6.1 % of total Hgb — ABNORMAL HIGH (ref <5.7)
Mean Plasma Glucose: 128 (calc)
eAG (mmol/L): 7.1 (calc)

## 2019-12-10 ENCOUNTER — Encounter: Payer: Self-pay | Admitting: Internal Medicine

## 2019-12-18 ENCOUNTER — Encounter: Payer: Self-pay | Admitting: *Deleted

## 2019-12-18 DIAGNOSIS — R7989 Other specified abnormal findings of blood chemistry: Secondary | ICD-10-CM

## 2019-12-18 MED ORDER — METFORMIN HCL 500 MG PO TABS
500.0000 mg | ORAL_TABLET | Freq: Every day | ORAL | 3 refills | Status: DC
Start: 2019-12-18 — End: 2020-09-20

## 2019-12-18 MED ORDER — VITAMIN D (ERGOCALCIFEROL) 1.25 MG (50000 UNIT) PO CAPS
50000.0000 [IU] | ORAL_CAPSULE | ORAL | 0 refills | Status: DC
Start: 2019-12-18 — End: 2020-04-06

## 2019-12-31 ENCOUNTER — Other Ambulatory Visit (HOSPITAL_COMMUNITY): Payer: Self-pay | Admitting: Family Medicine

## 2019-12-31 DIAGNOSIS — R928 Other abnormal and inconclusive findings on diagnostic imaging of breast: Secondary | ICD-10-CM

## 2020-01-20 ENCOUNTER — Ambulatory Visit (HOSPITAL_COMMUNITY)
Admission: RE | Admit: 2020-01-20 | Discharge: 2020-01-20 | Disposition: A | Discharge status: Home / Self Care | Payer: BC Managed Care – PPO | Source: Ambulatory Visit | Attending: Family Medicine | Admitting: Family Medicine

## 2020-01-20 ENCOUNTER — Other Ambulatory Visit: Payer: Self-pay

## 2020-01-20 DIAGNOSIS — R922 Inconclusive mammogram: Secondary | ICD-10-CM | POA: Diagnosis not present

## 2020-01-20 DIAGNOSIS — R928 Other abnormal and inconclusive findings on diagnostic imaging of breast: Secondary | ICD-10-CM | POA: Diagnosis not present

## 2020-02-09 ENCOUNTER — Encounter: Payer: Self-pay | Admitting: Gastroenterology

## 2020-02-09 ENCOUNTER — Ambulatory Visit: Payer: BC Managed Care – PPO | Admitting: Gastroenterology

## 2020-02-09 ENCOUNTER — Other Ambulatory Visit: Payer: Self-pay

## 2020-02-09 ENCOUNTER — Encounter: Payer: Self-pay | Admitting: Emergency Medicine

## 2020-02-09 DIAGNOSIS — Z79899 Other long term (current) drug therapy: Secondary | ICD-10-CM | POA: Diagnosis not present

## 2020-02-09 DIAGNOSIS — Z1211 Encounter for screening for malignant neoplasm of colon: Secondary | ICD-10-CM

## 2020-02-09 HISTORY — DX: Encounter for screening for malignant neoplasm of colon: Z12.11

## 2020-02-09 NOTE — Patient Instructions (Signed)
Colonoscopy to be scheduled. See separate instructions.  ?

## 2020-02-09 NOTE — Progress Notes (Signed)
Primary Care Physician:  Alycia Rossetti, MD  Primary Gastroenterologist:  Formerly Barney Drain, MD   Chief Complaint  Patient presents with  . Colonoscopy    consult, doing ok    HPI:  Robin Sherman is a 59 y.o. female here to schedule 5 year follow-up colonoscopy.  She had a colonoscopy in July 2014, noted to have moderate diverticulosis in the sigmoid colon, moderate hemorrhoids.  Prep in the right colon was poor, cannot rule out polyps less than 5 mm.  Because of this it was advised for her to have a 5-year follow-up colonoscopy.  Patient had [place] necks the night before her procedure.   BM regular. No melena, brbpr. No abdominal pain. No heartburn. No n/v.     Current Outpatient Medications  Medication Sig Dispense Refill  . albuterol (VENTOLIN HFA) 108 (90 Base) MCG/ACT inhaler Inhale 2 puffs into the lungs every 6 (six) hours as needed.     Marland Kitchen amLODipine (NORVASC) 10 MG tablet Take 1 tablet (10 mg total) by mouth daily. 90 tablet 3  . hydrochlorothiazide (HYDRODIURIL) 25 MG tablet Take 25 mg by mouth daily.    . meloxicam (MOBIC) 7.5 MG tablet Take 1 tablet (7.5 mg total) by mouth daily. 30 tablet 5  . metFORMIN (GLUCOPHAGE) 500 MG tablet Take 1 tablet (500 mg total) by mouth daily with breakfast. 90 tablet 3  . pregabalin (LYRICA) 75 MG capsule Take 1 capsule (75 mg total) by mouth 2 (two) times daily. 60 capsule 2  . Vitamin D, Ergocalciferol, (DRISDOL) 1.25 MG (50000 UNIT) CAPS capsule Take 1 capsule (50,000 Units total) by mouth every 7 (seven) days. x12 weeks, then D/C. When Tx complete, resume OTC Vit D 2000IU PO QD. 12 capsule 0   No current facility-administered medications for this visit.    Allergies as of 02/09/2020 - Review Complete 02/09/2020  Allergen Reaction Noted  . Codeine Hives 03/18/2011  . Latex Rash 02/28/2015  . Naproxen Rash 11/27/2011    Past Medical History:  Diagnosis Date  . Allergy    pollen  . Arthritis    left knee  . Asthma   .  Eczema    Followed by Dr. Nevada Crane dermatology  . Headache(784.0) 09/25/2012  . Hypertension   . Neuromuscular disorder ([place])   . Sciatica of left side 10/13/2013  . TRIGGER FINGER 11/03/2008   Qualifier: Diagnosis of  By: Aline Brochure MD, Dorothyann Peng      Past Surgical History:  Procedure Laterality Date  . ABDOMINAL HYSTERECTOMY     bleeding  . BILATERAL SALPINGECTOMY Bilateral 08/01/2013   Procedure: BILATERAL SALPINGECTOMY;  Surgeon: Jonnie Kind, MD;  Location: [facility];  Service: Gynecology;  Laterality: Bilateral;  . CHONDROPLASTY Left 01/14/2014   Procedure: CHONDROPLASTY OF FEMUR;  Surgeon: Carole Civil, MD;  Location: [facility];  Service: Orthopedics;  Laterality: Left;  . CHONDROPLASTY Right 12/12/2018   Procedure: CHONDROPLASTY;  Surgeon: Carole Civil, MD;  Location: [facility];  Service: Orthopedics;  Laterality: Right;  . COLONOSCOPY N/A 01/27/2013   Dr. Oneida Alar: Right colon with poor prep (patient ate [place] necks the night before the procedure).  Moderate diverticulosis in the sigmoid colon, moderate hemorrhoids.  Marland Kitchen HEMATOMA EVACUATION N/A 08/03/2013   Procedure: EVACUATION PELVIC HEMATOMA;  Surgeon: Jonnie Kind, MD;  Location: [facility];  Service: Gynecology;  Laterality: N/A;  . KNEE ARTHROSCOPY WITH MEDIAL MENISECTOMY Left 01/14/2014   Procedure: KNEE ARTHROSCOPY WITH PARTIAL MEDIAL MENISECTOMY;  Surgeon: Carole Civil, MD;  Location: [facility];  Service: Orthopedics;  Laterality: Left;  . KNEE ARTHROSCOPY WITH MEDIAL MENISECTOMY Right 12/12/2018   Procedure: KNEE ARTHROSCOPY WITH MEDIAL MENISCECTOMY;  Surgeon: Carole Civil, MD;  Location: [facility];  Service: Orthopedics;  Laterality: Right;  . SUPRACERVICAL ABDOMINAL HYSTERECTOMY N/A 08/01/2013   Procedure: HYSTERECTOMY SUPRACERVICAL ABDOMINAL;  Surgeon: Jonnie Kind, MD;  Location: [facility];  Service: Gynecology;  Laterality: N/A;  . TUBAL LIGATION     [facility]    Family History  Problem Relation Age of Onset  .  Heart disease Mother        MI  . Asthma Mother   . Hypertension Mother   . Stroke Father   . Heart disease Father        MI  . Hypertension Father   . Cancer Sister        breast  . Colon cancer Neg Hx     Social History   Socioeconomic History  . Marital status: Married    Spouse name: [name]  . Number of children: 1  . Years of education: 62  . Highest education level: Not on file  Occupational History  . Occupation: CNA  Tobacco Use  . Smoking status: Current Every Day Smoker    Packs/day: 0.50    Years: 20.00    Pack years: 10.00    Types: Cigarettes    Start date: 07/18/1979  . Smokeless tobacco: Never Used  . Tobacco comment: wants to quit  Vaping Use  . Vaping Use: Never used  Substance and Sexual Activity  . Alcohol use: Yes    Alcohol/week: 0.0 standard drinks    Comment: occ  . Drug use: No  . Sexual activity: Yes    Birth control/protection: Surgical  Other Topics Concern  . Not on file  Social History Narrative   Lives at home with [name]   Visit with family and grandchildren   Social Determinants of Health   Financial Resource Strain:   . Difficulty of Paying Living Expenses:   Food Insecurity:   . Worried About Charity fundraiser in the Last Year:   . Arboriculturist in the Last Year:   Transportation Needs:   . Film/video editor (Medical):   Marland Kitchen Lack of Transportation (Non-Medical):   Physical Activity:   . Days of Exercise per Week:   . Minutes of Exercise per Session:   Stress:   . Feeling of Stress :   Social Connections:   . Frequency of Communication with Friends and Family:   . Frequency of Social Gatherings with Friends and Family:   . Attends Religious Services:   . Active Member of Clubs or Organizations:   . Attends Archivist Meetings:   Marland Kitchen Marital Status:   Intimate Partner Violence:   . Fear of Current or Ex-Partner:   . Emotionally Abused:   Marland Kitchen Physically Abused:   . Sexually Abused:        ROS:  General: Negative for anorexia, weight loss, fever, chills, fatigue, weakness. Eyes: Negative for vision changes.  ENT: Negative for hoarseness, difficulty swallowing , nasal congestion. CV: Negative for chest pain, angina, palpitations, dyspnea on exertion, peripheral edema.  Respiratory: Negative for dyspnea at rest, dyspnea on exertion, cough, sputum, wheezing.  GI: See history of present illness. GU:  Negative for dysuria, hematuria, urinary incontinence, urinary frequency, nocturnal urination.  MS: Negative for joint pain,.++low back pain.  Derm: Negative for rash or itching.  Neuro: Negative for weakness, abnormal sensation, seizure, frequent headaches, memory loss, confusion.  Psych: Negative for anxiety, depression, suicidal ideation, hallucinations.  Endo: Negative for unusual weight change.  Heme: Negative for bruising or bleeding. Allergy: Negative for rash or hives.    Physical Examination:  BP (!) 173/99   Pulse 82   Temp 98.1 F (36.7 C) (Oral)   Ht _0  (1.626 m)   Wt (!) 222 lb 3.2 oz (100.8 kg)   LMP 04/15/2013   BMI 38.14 kg/m    General: Well-nourished, well-developed in no acute distress.  Head: Normocephalic, atraumatic.   Eyes: Conjunctiva pink, no icterus. Mouth: masked Neck: Supple without thyromegaly, masses, or lymphadenopathy.  Lungs: Clear to auscultation bilaterally.  Heart: Regular rate and rhythm, no murmurs rubs or gallops.  Abdomen: Bowel sounds are normal, nontender, nondistended, no hepatosplenomegaly or masses, no abdominal bruits or    hernia , no rebound or guarding.   Rectal: Not performed Extremities: No lower extremity edema. No clubbing or deformities.  Neuro: Alert and oriented x 4 , grossly normal neurologically.  Skin: Warm and dry, no rash or jaundice.   Psych: Alert and cooperative, normal mood and affect.  Labs: Lab Results  Component Value Date   CREATININE 0.71 12/08/2019   BUN 11 12/08/2019   NA 140  12/08/2019   K 4.5 12/08/2019   CL 103 12/08/2019   CO2 25 12/08/2019    Lab Results  Component Value Date   WBC 9.0 12/08/2019   HGB 13.8 12/08/2019   HCT 41.6 12/08/2019   MCV 85.4 12/08/2019   PLT 422 (H) 12/08/2019    Lab Results  Component Value Date   HGBA1C 6.1 (H) 12/08/2019    Imaging Studies: MM DIAG BREAST TOMO BILATERAL  Result Date: 01/20/2020 CLINICAL DATA:  Delayed follow-up after a benign biopsy of a mass in the left breast at the 10 o'clock position with pathology revealing a fibroadenoma. EXAM: DIGITAL DIAGNOSTIC BILATERAL MAMMOGRAM WITH TOMO AND CAD COMPARISON:  Previous exams. ACR Breast Density Category c: The breast tissue is heterogeneously dense, which may obscure small masses. FINDINGS: No suspicious masses or calcifications are seen in either breast. A ribbon shaped biopsy marking clip is present at the site of the biopsied mass in the left breast at the 10 o'clock position anterior depth. There is no mammographic evidence of malignancy in either breast. Mammographic images were processed with CAD. IMPRESSION: No mammographic evidence of malignancy in either breast. RECOMMENDATION: Screening mammogram in one year.(Code:SM-B-01Y) I have discussed the findings and recommendations with the patient. If applicable, a reminder letter will be sent to the patient regarding the next appointment. BI-RADS CATEGORY  1: Negative. Electronically Signed   By: Everlean Alstrom M.D.   On: 01/20/2020 12:10    Impression/plan:  Pleasant 59 year old female presenting to schedule colonoscopy.  Her last one was 5 years ago, prep was poor in the right colon therefore small polyps (less than 5 mm) could have been missed.  Because of this it was advised that she had a 5-year follow-up colonoscopy.  She is doing very well.  No GI concerns. She describes taking Mac Citrate for prep last time but we prescribed Prepopik. It is not clear which she took. She also ate solid food the night before  her test. She is aware of need to follow directions accordingly to have successful prep.  Colonoscopy in the near future with Dr. Abbey Chatters. ASA III.  I have discussed the risks, alternatives, benefits with  regards to but not limited to the risk of reaction to medication, bleeding, infection, perforation and the patient is agreeable to proceed. Written consent to be obtained.

## 2020-02-09 NOTE — Progress Notes (Signed)
Cc'ed to pcp °

## 2020-02-23 ENCOUNTER — Telehealth: Payer: Self-pay | Admitting: *Deleted

## 2020-02-23 ENCOUNTER — Encounter: Payer: Self-pay | Admitting: *Deleted

## 2020-02-23 MED ORDER — CLENPIQ 10-3.5-12 MG-GM -GM/160ML PO SOLN: 1.0000 | Freq: Once | ORAL | 0 refills | Status: AC

## 2020-02-23 NOTE — Telephone Encounter (Signed)
Patient left VM she can schedule for 9/21. Patient scheduled for 9/21 at 8:15am. Left message advising will mail instructions with her pre-op/covid test appt.

## 2020-02-23 NOTE — Telephone Encounter (Signed)
Patient nneds TCS with propofol with Dr. Abbey Chatters, asa 3. Wants small volume prep  Called pt, provided with dates and she is going to check work schedule and call back

## 2020-02-26 ENCOUNTER — Telehealth: Payer: Self-pay | Admitting: *Deleted

## 2020-02-26 NOTE — Telephone Encounter (Signed)
[employer] and spoke with PA rep Jocelyn. No PA is required. Ref# [id number]

## 2020-03-10 ENCOUNTER — Other Ambulatory Visit: Payer: Self-pay

## 2020-03-10 ENCOUNTER — Encounter: Payer: Self-pay | Admitting: Family Medicine

## 2020-03-10 ENCOUNTER — Ambulatory Visit (INDEPENDENT_AMBULATORY_CARE_PROVIDER_SITE_OTHER): Payer: Self-pay | Admitting: Family Medicine

## 2020-03-10 VITALS — BP 142/88 | HR 100 | Temp 97.8°F | Resp 18 | Ht 64.0 in | Wt 223.0 lb

## 2020-03-10 DIAGNOSIS — T63301A Toxic effect of unspecified spider venom, accidental (unintentional), initial encounter: Secondary | ICD-10-CM

## 2020-03-10 DIAGNOSIS — M170 Bilateral primary osteoarthritis of knee: Secondary | ICD-10-CM | POA: Diagnosis not present

## 2020-03-10 DIAGNOSIS — R2242 Localized swelling, mass and lump, left lower limb: Secondary | ICD-10-CM

## 2020-03-10 LAB — CBC WITH DIFFERENTIAL/PLATELET
Absolute Monocytes: 669 cells/uL (ref 200–950)
Basophils Absolute: 53 cells/uL (ref 0–200)
Basophils Relative: 0.6 %
Eosinophils Absolute: 818 cells/uL — ABNORMAL HIGH (ref 15–500)
Eosinophils Relative: 9.3 %
HCT: 40.8 % (ref 35.0–45.0)
Hemoglobin: 13.4 g/dL (ref 11.7–15.5)
Lymphs Abs: 3080 cells/uL (ref 850–3900)
MCH: 28.2 pg (ref 27.0–33.0)
MCHC: 32.8 g/dL (ref 32.0–36.0)
MCV: 85.7 fL (ref 80.0–100.0)
MPV: 9.9 fL (ref 7.5–12.5)
Monocytes Relative: 7.6 %
Neutro Abs: 4180 cells/uL (ref 1500–7800)
Neutrophils Relative %: 47.5 %
Platelets: 437 10*3/uL — ABNORMAL HIGH (ref 140–400)
RBC: 4.76 10*6/uL (ref 3.80–5.10)
RDW: 13 % (ref 11.0–15.0)
Total Lymphocyte: 35 %
WBC: 8.8 10*3/uL (ref 3.8–10.8)

## 2020-03-10 LAB — COMPREHENSIVE METABOLIC PANEL
AG Ratio: 1.3 (calc) (ref 1.0–2.5)
ALT: 41 U/L — ABNORMAL HIGH (ref 6–29)
AST: 36 U/L — ABNORMAL HIGH (ref 10–35)
Albumin: 4.3 g/dL (ref 3.6–5.1)
Alkaline phosphatase (APISO): 132 U/L (ref 37–153)
BUN: 12 mg/dL (ref 7–25)
CO2: 28 mmol/L (ref 20–32)
Calcium: 9.4 mg/dL (ref 8.6–10.4)
Chloride: 103 mmol/L (ref 98–110)
Creat: 0.68 mg/dL (ref 0.50–1.05)
Globulin: 3.2 g/dL (calc) (ref 1.9–3.7)
Glucose, Bld: 100 mg/dL — ABNORMAL HIGH (ref 65–99)
Potassium: 4.2 mmol/L (ref 3.5–5.3)
Sodium: 139 mmol/L (ref 135–146)
Total Bilirubin: 0.3 mg/dL (ref 0.2–1.2)
Total Protein: 7.5 g/dL (ref 6.1–8.1)

## 2020-03-10 MED ORDER — SULFAMETHOXAZOLE-TRIMETHOPRIM 800-160 MG PO TABS: 1.0000 | ORAL_TABLET | Freq: Two times a day (BID) | ORAL | 0 refills | Status: DC

## 2020-03-10 MED ORDER — ALBUTEROL SULFATE HFA 108 (90 BASE) MCG/ACT IN AERS: 2.0000 | INHALATION_SPRAY | Freq: Four times a day (QID) | RESPIRATORY_TRACT | 1 refills | Status: DC | PRN

## 2020-03-10 MED ORDER — OXYCODONE-ACETAMINOPHEN 10-325 MG PO TABS: 1.0000 | ORAL_TABLET | Freq: Three times a day (TID) | ORAL | 0 refills | Status: DC | PRN

## 2020-03-10 MED ORDER — FLUCONAZOLE 150 MG PO TABS
150.0000 mg | ORAL_TABLET | Freq: Once | ORAL | 0 refills | Status: AC
Start: 2020-03-10 — End: 2020-03-10

## 2020-03-10 NOTE — Patient Instructions (Addendum)
Use warm compress Take antibiotics We will call with lab results  F/U 3 months

## 2020-03-10 NOTE — Assessment & Plan Note (Signed)
Percocet refilled

## 2020-03-10 NOTE — Progress Notes (Signed)
   Subjective:    Patient ID: Robin Sherman, female    DOB: August 28, 1960, 59 y.o.   MRN: [id number]  Patient presents for Insect Bite (x1 week- inner L calf- weakness, fatigue, pain since then)  She was bitten by a spider 1 week ago, she had a knot and leg bruised up, no fever or chills with it. Monday had myalgais all over, fatigue, and dizziness.  Mild headache  She took ibuprofen and that didn't help She layed in the bed all day Monday due to weakness No vomiting or diarrhea  Today feels a little better, but not herself Still has a sore knot on her leg but bruising swelling went down  Also request refill on pain medication    Review Of Systems:  GEN- + fatigue, fever, weight loss,weakness, recent illness HEENT- denies eye drainage, change in vision, nasal discharge, CVS- denies chest pain, palpitations RESP- denies SOB, cough, wheeze ABD- denies N/V, change in stools, abd pain GU- denies dysuria, hematuria, dribbling, incontinence MSK- + joint pain,+ muscle aches, injury Neuro- denies headache, dizziness, syncope, seizure activity       Objective:    BP (!) 142/88   Pulse 100   Temp 97.8 F (36.6 C) (Temporal)   Resp 18   Ht 5\' 4"  (1.626 m)   Wt 223 lb (101.2 kg)   LMP 04/15/2013   SpO2 98%   BMI 38.28 kg/m  GEN- NAD, alert and oriented x3 HEENT- PERRL, EOMI, non injected sclera, pink conjunctiva, Neck- Supple, no LAD  CVS- RRR, no murmur RESP-CTAB EXT- trace edema ankles, Left leg nickle size indurated swelling, mild TTP ,no fluctance, no erythema  Neg homans  Pulses- Radial, DP- 2+        Assessment & Plan:      Problem List Items Addressed This Visit      Unprioritized   Arthritis of knee, degenerative    Percocet refilled       Relevant Medications   oxyCODONE-acetaminophen (PERCOCET) 10-325 MG tablet    Other Visit Diagnoses    Nodule of skin of left lower leg    -  Primary   Spider bite with nodule to leg and systemic symptoms, check STAT  labs, start bactrim DS BID, I dont fee a good fluctant area to drain, so start warm compresses   Spider bite wound, accidental or unintentional, initial encounter       Relevant Orders   CBC with Differential/Platelet   Comprehensive metabolic panel      Note: This dictation was prepared with Dragon dictation along with smaller phrase technology. Any transcriptional errors that result from this process are unintentional.

## 2020-03-25 ENCOUNTER — Other Ambulatory Visit: Payer: Self-pay | Admitting: Family Medicine

## 2020-03-25 NOTE — Telephone Encounter (Signed)
Ok to refill??  Last office visit 03/10/2020.  Last refill 12/08/2019, #2 refills.

## 2020-03-29 NOTE — Patient Instructions (Signed)
Your procedure is scheduled on: 04/06/2020  Report to [facility] at    6:45  AM.  Call this number if you have problems the morning of surgery: [phone]   Remember:              Follow Directions on the letter you received from Your Physician's office regarding the Bowel Prep              No Smoking the day of Procedure :   Take these medicines the morning of surgery with A SIP OF WATER: Amlodipineand Lyrica  (oxycodone and inhalers if needed)  No diabetic medication am of procedure   Do not wear jewelry, make-up or nail polish.    Do not bring valuables to the hospital.  Contacts, dentures or bridgework may not be worn into surgery.  .   Patients discharged the day of surgery will not be allowed to drive home.     Colonoscopy, Adult, Care After This sheet gives you information about how to care for yourself after your procedure. Your health care provider may also give you more specific instructions. If you have problems or questions, contact your health care provider. What can I expect after the procedure? After the procedure, it is common to have:  A small amount of blood in your stool for 24 hours after the procedure.  Some gas.  Mild abdominal cramping or bloating.  Follow these instructions at home: General instructions   For the first 24 hours after the procedure: ? Do not drive or use machinery. ? Do not sign important documents. ? Do not drink alcohol. ? Do your regular daily activities at a slower pace than normal. ? Eat soft, easy-to-digest foods. ? Rest often.  Take over-the-counter or prescription medicines only as told by your health care provider.  It is up to you to get the results of your procedure. Ask your health care provider, or the department performing the procedure, when your results will be ready. Relieving cramping and bloating  Try walking around when you have cramps or feel bloated.  Apply heat to your abdomen as told by your health  care provider. Use a heat source that your health care provider recommends, such as a moist heat pack or a heating pad. ? Place a towel between your skin and the heat source. ? Leave the heat on for 20-30 minutes. ? Remove the heat if your skin turns bright red. This is especially important if you are unable to feel pain, heat, or cold. You may have a greater risk of getting burned. Eating and drinking  Drink enough fluid to keep your urine clear or pale yellow.  Resume your normal diet as instructed by your health care provider. Avoid heavy or fried foods that are hard to digest.  Avoid drinking alcohol for as long as instructed by your health care provider. Contact a health care provider if:  You have blood in your stool 2-3 days after the procedure. Get help right away if:  You have more than a small spotting of blood in your stool.  You pass large blood clots in your stool.  Your abdomen is swollen.  You have nausea or vomiting.  You have a fever.  You have increasing abdominal pain that is not relieved with medicine. This information is not intended to replace advice given to you by your health care provider. Make sure you discuss any questions you have with your health care provider. Document Released: 02/15/2004  Document Revised: 03/27/2016 Document Reviewed: 09/14/2015 Elsevier Interactive Patient Education  [employer].

## 2020-04-02 ENCOUNTER — Other Ambulatory Visit: Payer: Self-pay

## 2020-04-02 ENCOUNTER — Other Ambulatory Visit (HOSPITAL_COMMUNITY)
Admission: RE | Admit: 2020-04-02 | Discharge: 2020-04-02 | Disposition: A | Discharge status: Home / Self Care | Payer: BC Managed Care – PPO | Source: Ambulatory Visit | Attending: Internal Medicine | Admitting: Internal Medicine

## 2020-04-02 ENCOUNTER — Encounter (HOSPITAL_COMMUNITY): Payer: Self-pay

## 2020-04-02 ENCOUNTER — Encounter (HOSPITAL_COMMUNITY)
Admission: RE | Admit: 2020-04-02 | Discharge: 2020-04-02 | Disposition: A | Discharge status: Home / Self Care | Payer: BC Managed Care – PPO | Source: Ambulatory Visit | Attending: Internal Medicine | Admitting: Internal Medicine

## 2020-04-02 DIAGNOSIS — I10 Essential (primary) hypertension: Secondary | ICD-10-CM | POA: Insufficient documentation

## 2020-04-02 DIAGNOSIS — Z01818 Encounter for other preprocedural examination: Secondary | ICD-10-CM | POA: Diagnosis not present

## 2020-04-02 DIAGNOSIS — Z20822 Contact with and (suspected) exposure to covid-19: Secondary | ICD-10-CM | POA: Insufficient documentation

## 2020-04-03 LAB — SARS CORONAVIRUS 2 (TAT 6-24 HRS): SARS Coronavirus 2: NEGATIVE

## 2020-04-06 ENCOUNTER — Ambulatory Visit (HOSPITAL_COMMUNITY): Payer: BC Managed Care – PPO | Admitting: Anesthesiology

## 2020-04-06 ENCOUNTER — Encounter (HOSPITAL_COMMUNITY)
Admission: RE | Disposition: A | Discharge status: Home / Self Care | Payer: Self-pay | Source: Home / Self Care | Attending: Internal Medicine

## 2020-04-06 ENCOUNTER — Encounter (HOSPITAL_COMMUNITY): Payer: Self-pay

## 2020-04-06 ENCOUNTER — Ambulatory Visit (HOSPITAL_COMMUNITY)
Admission: RE | Admit: 2020-04-06 | Discharge: 2020-04-06 | Disposition: A | Discharge status: Home / Self Care | Payer: BC Managed Care – PPO | Attending: Internal Medicine | Admitting: Internal Medicine

## 2020-04-06 DIAGNOSIS — Z7951 Long term (current) use of inhaled steroids: Secondary | ICD-10-CM | POA: Diagnosis not present

## 2020-04-06 DIAGNOSIS — Z79899 Other long term (current) drug therapy: Secondary | ICD-10-CM | POA: Diagnosis not present

## 2020-04-06 DIAGNOSIS — K5730 Diverticulosis of large intestine without perforation or abscess without bleeding: Secondary | ICD-10-CM | POA: Diagnosis not present

## 2020-04-06 DIAGNOSIS — M5432 Sciatica, left side: Secondary | ICD-10-CM | POA: Insufficient documentation

## 2020-04-06 DIAGNOSIS — D122 Benign neoplasm of ascending colon: Secondary | ICD-10-CM | POA: Insufficient documentation

## 2020-04-06 DIAGNOSIS — Z888 Allergy status to other drugs, medicaments and biological substances status: Secondary | ICD-10-CM | POA: Insufficient documentation

## 2020-04-06 DIAGNOSIS — Z8249 Family history of ischemic heart disease and other diseases of the circulatory system: Secondary | ICD-10-CM | POA: Insufficient documentation

## 2020-04-06 DIAGNOSIS — Z91040 Latex allergy status: Secondary | ICD-10-CM | POA: Insufficient documentation

## 2020-04-06 DIAGNOSIS — K635 Polyp of colon: Secondary | ICD-10-CM

## 2020-04-06 DIAGNOSIS — Z90710 Acquired absence of both cervix and uterus: Secondary | ICD-10-CM | POA: Diagnosis not present

## 2020-04-06 DIAGNOSIS — Z825 Family history of asthma and other chronic lower respiratory diseases: Secondary | ICD-10-CM | POA: Diagnosis not present

## 2020-04-06 DIAGNOSIS — M1990 Unspecified osteoarthritis, unspecified site: Secondary | ICD-10-CM | POA: Insufficient documentation

## 2020-04-06 DIAGNOSIS — F17210 Nicotine dependence, cigarettes, uncomplicated: Secondary | ICD-10-CM | POA: Diagnosis not present

## 2020-04-06 DIAGNOSIS — Z885 Allergy status to narcotic agent status: Secondary | ICD-10-CM | POA: Insufficient documentation

## 2020-04-06 DIAGNOSIS — Z7984 Long term (current) use of oral hypoglycemic drugs: Secondary | ICD-10-CM | POA: Diagnosis not present

## 2020-04-06 DIAGNOSIS — Z803 Family history of malignant neoplasm of breast: Secondary | ICD-10-CM | POA: Diagnosis not present

## 2020-04-06 DIAGNOSIS — L309 Dermatitis, unspecified: Secondary | ICD-10-CM | POA: Diagnosis not present

## 2020-04-06 DIAGNOSIS — Z1211 Encounter for screening for malignant neoplasm of colon: Secondary | ICD-10-CM | POA: Diagnosis not present

## 2020-04-06 DIAGNOSIS — K648 Other hemorrhoids: Secondary | ICD-10-CM | POA: Diagnosis not present

## 2020-04-06 DIAGNOSIS — I10 Essential (primary) hypertension: Secondary | ICD-10-CM | POA: Insufficient documentation

## 2020-04-06 DIAGNOSIS — Z823 Family history of stroke: Secondary | ICD-10-CM | POA: Diagnosis not present

## 2020-04-06 DIAGNOSIS — J45909 Unspecified asthma, uncomplicated: Secondary | ICD-10-CM | POA: Insufficient documentation

## 2020-04-06 DIAGNOSIS — R519 Headache, unspecified: Secondary | ICD-10-CM | POA: Insufficient documentation

## 2020-04-06 DIAGNOSIS — Z139 Encounter for screening, unspecified: Secondary | ICD-10-CM | POA: Diagnosis not present

## 2020-04-06 HISTORY — PX: COLONOSCOPY WITH PROPOFOL: SHX5780

## 2020-04-06 HISTORY — PX: POLYPECTOMY: SHX5525

## 2020-04-06 LAB — GLUCOSE, CAPILLARY: Glucose-Capillary: 129 mg/dL — ABNORMAL HIGH (ref 70–99)

## 2020-04-06 SURGERY — COLONOSCOPY WITH PROPOFOL
Anesthesia: General

## 2020-04-06 MED ORDER — LACTATED RINGERS IV SOLN: Freq: Once | INTRAVENOUS | Status: AC

## 2020-04-06 MED ORDER — LACTATED RINGERS IV SOLN: INTRAVENOUS | Status: DC | PRN

## 2020-04-06 MED ORDER — PROPOFOL 500 MG/50ML IV EMUL
INTRAVENOUS | Status: DC | PRN
  Administered 2020-04-06: 150 ug/kg/min via INTRAVENOUS

## 2020-04-06 MED ORDER — PROPOFOL 10 MG/ML IV BOLUS
INTRAVENOUS | Status: DC | PRN
  Administered 2020-04-06: 100 mg via INTRAVENOUS

## 2020-04-06 MED ORDER — CHLORHEXIDINE GLUCONATE CLOTH 2 % EX PADS: 6.0000 | MEDICATED_PAD | Freq: Once | CUTANEOUS | Status: DC

## 2020-04-06 MED ORDER — STERILE WATER FOR IRRIGATION IR SOLN
Status: DC | PRN
  Administered 2020-04-06: 1.5 mL

## 2020-04-06 NOTE — Discharge Instructions (Addendum)
Colonoscopy Discharge Instructions  Read the instructions outlined below and refer to this sheet in the next few weeks. These discharge instructions provide you with general information on caring for yourself after you leave the hospital. Your doctor may also give you specific instructions. While your treatment has been planned according to the most current medical practices available, unavoidable complications occasionally occur.   ACTIVITY  You may resume your regular activity, but move at a slower pace for the next 24 hours.   Take frequent rest periods for the next 24 hours.   Walking will help get rid of the air and reduce the bloated feeling in your belly (abdomen).   No driving for 24 hours (because of the medicine (anesthesia) used during the test).    Do not sign any important legal documents or operate any machinery for 24 hours (because of the anesthesia used during the test).  NUTRITION  Drink plenty of fluids.   You may resume your normal diet as instructed by your doctor.   Begin with a light meal and progress to your normal diet. Heavy or fried foods are harder to digest and may make you feel sick to your stomach (nauseated).   Avoid alcoholic beverages for 24 hours or as instructed.  MEDICATIONS  You may resume your normal medications unless your doctor tells you otherwise.  WHAT YOU CAN EXPECT TODAY  Some feelings of bloating in the abdomen.   Passage of more gas than usual.   Spotting of blood in your stool or on the toilet paper.  IF YOU HAD POLYPS REMOVED DURING THE COLONOSCOPY:  No aspirin products for 7 days or as instructed.   No alcohol for 7 days or as instructed.   Eat a soft diet for the next 24 hours.  FINDING OUT THE RESULTS OF YOUR TEST Not all test results are available during your visit. If your test results are not back during the visit, make an appointment with your caregiver to find out the results. Do not assume everything is normal if  you have not heard from your caregiver or the medical facility. It is important for you to follow up on all of your test results.  SEEK IMMEDIATE MEDICAL ATTENTION IF:  You have more than a spotting of blood in your stool.   Your belly is swollen (abdominal distention).   You are nauseated or vomiting.   You have a temperature over 101.   You have abdominal pain or discomfort that is severe or gets worse throughout the day.   Your colonoscopy was relatively unremarkable.  I did remove one small polyp.  Await pathology results, my office will contact you.  I recommend we repeat colonoscopy in 5 years.  You also had internal hemorrhoids and diverticulosis.  I recommend you increase fiber in your diet or add over-the-counter Benefiber/Metamucil.  Follow-up with GI as needed  I hope you have a great rest of your week!  Elon Alas. Abbey Chatters, D.O. Gastroenterology and Hepatology [facility]    High-Fiber Diet Fiber, also called dietary fiber, is a type of carbohydrate that is found in fruits, vegetables, whole grains, and beans. A high-fiber diet can have many health benefits. Your health care provider may recommend a high-fiber diet to help:  Prevent constipation. Fiber can make your bowel movements more regular.  Lower your cholesterol.  Relieve the following conditions: ? Swelling of veins in the anus (hemorrhoids). ? Swelling and irritation (inflammation) of specific areas of the digestive tract (  uncomplicated diverticulosis). ? A problem of the large intestine (colon) that sometimes causes pain and diarrhea (irritable bowel syndrome, IBS).  Prevent overeating as part of a weight-loss plan.  Prevent heart disease, type 2 diabetes, and certain cancers. What is my plan? The recommended daily fiber intake in grams (g) includes:  38 g for men age 22 or younger.  30 g for men over age 15.  72 g for women age 48 or younger.  21 g for women over age  59. You can get the recommended daily intake of dietary fiber by:  Eating a variety of fruits, vegetables, grains, and beans.  Taking a fiber supplement, if it is not possible to get enough fiber through your diet. What do I need to know about a high-fiber diet?  It is better to get fiber through food sources rather than from fiber supplements. There is not a lot of research about how effective supplements are.  Always check the fiber content on the nutrition facts label of any prepackaged food. Look for foods that contain 5 g of fiber or more per serving.  Talk with a diet and nutrition specialist (dietitian) if you have questions about specific foods that are recommended or not recommended for your medical condition, especially if those foods are not listed below.  Gradually increase how much fiber you consume. If you increase your intake of dietary fiber too quickly, you may have bloating, cramping, or gas.  Drink plenty of water. Water helps you to digest fiber. What are tips for following this plan?  Eat a wide variety of high-fiber foods.  Make sure that half of the grains that you eat each day are whole grains.  Eat breads and cereals that are made with whole-grain flour instead of refined flour or white flour.  Eat brown rice, bulgur wheat, or millet instead of white rice.  Start the day with a breakfast that is high in fiber, such as a cereal that contains 5 g of fiber or more per serving.  Use beans in place of meat in soups, salads, and pasta dishes.  Eat high-fiber snacks, such as berries, raw vegetables, nuts, and popcorn.  Choose whole fruits and vegetables instead of processed forms like juice or sauce. What foods can I eat?  Fruits Berries. Pears. Apples. Oranges. Avocado. Prunes and raisins. Dried figs. Vegetables Sweet potatoes. Spinach. Kale. Artichokes. Cabbage. Broccoli. Cauliflower. Green peas. Carrots. Squash. Grains Whole-grain breads. Multigrain  cereal. Oats and oatmeal. Brown rice. Barley. Bulgur wheat. [place]. Quinoa. Bran muffins. Popcorn. Rye wafer crackers. Meats and other proteins Navy, kidney, and pinto beans. Soybeans. Split peas. Lentils. Nuts and seeds. Dairy Fiber-fortified yogurt. Beverages Fiber-fortified soy milk. Fiber-fortified orange juice. Other foods Fiber bars. The items listed above may not be a complete list of recommended foods and beverages. Contact a dietitian for more options. What foods are not recommended? Fruits Fruit juice. Cooked, strained fruit. Vegetables Fried potatoes. Canned vegetables. Well-cooked vegetables. Grains White bread. Pasta made with refined flour. White rice. Meats and other proteins Fatty cuts of meat. Fried chicken or fried fish. Dairy Milk. Yogurt. Cream cheese. Sour cream. Fats and oils Butters. Beverages Soft drinks. Other foods Cakes and pastries. The items listed above may not be a complete list of foods and beverages to avoid. Contact a dietitian for more information. Summary  Fiber is a type of carbohydrate. It is found in fruits, vegetables, whole grains, and beans.  There are many health benefits of eating a high-fiber diet,  such as preventing constipation, lowering blood cholesterol, helping with weight loss, and reducing your risk of heart disease, diabetes, and certain cancers.  Gradually increase your intake of fiber. Increasing too fast can result in cramping, bloating, and gas. Drink plenty of water while you increase your fiber.  The best sources of fiber include whole fruits and vegetables, whole grains, nuts, seeds, and beans. This information is not intended to replace advice given to you by your health care provider. Make sure you discuss any questions you have with your health care provider. Document Revised: 05/07/2017 Document Reviewed: 05/07/2017 Elsevier Patient Education  [place].     Diverticulosis  Diverticulosis is a  condition that develops when small pouches (diverticula) form in the wall of the large intestine (colon). The colon is where water is absorbed and stool (feces) is formed. The pouches form when the inside layer of the colon pushes through weak spots in the outer layers of the colon. You may have a few pouches or many of them. The pouches usually do not cause problems unless they become inflamed or infected. When this happens, the condition is called diverticulitis. What are the causes? The cause of this condition is not known. What increases the risk? The following factors may make you more likely to develop this condition:  Being older than age 72. Your risk for this condition increases with age. Diverticulosis is rare among people younger than age 34. By age 43, many people have it.  Eating a low-fiber diet.  Having frequent constipation.  Being overweight.  Not getting enough exercise.  Smoking.  Taking over-the-counter pain medicines, like aspirin and ibuprofen.  Having a family history of diverticulosis. What are the signs or symptoms? In most people, there are no symptoms of this condition. If you do have symptoms, they may include:  Bloating.  Cramps in the abdomen.  Constipation or diarrhea.  Pain in the lower left side of the abdomen. How is this diagnosed? Because diverticulosis usually has no symptoms, it is most often diagnosed during an exam for other colon problems. The condition may be diagnosed by:  Using a flexible scope to examine the colon (colonoscopy).  Taking an X-ray of the colon after dye has been put into the colon (barium enema).  Having a CT scan. How is this treated? You may not need treatment for this condition. Your health care provider may recommend treatment to prevent problems. You may need treatment if you have symptoms or if you previously had diverticulitis. Treatment may include:  Eating a high-fiber diet.  Taking a fiber  supplement.  Taking a live bacteria supplement (probiotic).  Taking medicine to relax your colon. Follow these instructions at home: Medicines  Take over-the-counter and prescription medicines only as told by your health care provider.  If told by your health care provider, take a fiber supplement or probiotic. Constipation prevention Your condition may cause constipation. To prevent or treat constipation, you may need to:  Drink enough fluid to keep your urine pale yellow.  Take over-the-counter or prescription medicines.  Eat foods that are high in fiber, such as beans, whole grains, and fresh fruits and vegetables.  Limit foods that are high in fat and processed sugars, such as fried or sweet foods.  General instructions  Try not to strain when you have a bowel movement.  Keep all follow-up visits as told by your health care provider. This is important. Contact a health care provider if you:  Have pain in  your abdomen.  Have bloating.  Have cramps.  Have not had a bowel movement in 3 days. Get help right away if:  Your pain gets worse.  Your bloating becomes very bad.  You have a fever or chills, and your symptoms suddenly get worse.  You vomit.  You have bowel movements that are bloody or black.  You have bleeding from your rectum. Summary  Diverticulosis is a condition that develops when small pouches (diverticula) form in the wall of the large intestine (colon).  You may have a few pouches or many of them.  This condition is most often diagnosed during an exam for other colon problems.  Treatment may include increasing the fiber in your diet, taking supplements, or taking medicines. This information is not intended to replace advice given to you by your health care provider. Make sure you discuss any questions you have with your health care provider. Document Revised: 01/30/2019 Document Reviewed: 01/30/2019 Elsevier Patient Education  [place].    Colon Polyps  Polyps are tissue growths inside the body. Polyps can grow in many places, including the large intestine (colon). A polyp may be a round bump or a mushroom-shaped growth. You could have one polyp or several. Most colon polyps are noncancerous (benign). However, some colon polyps can become cancerous over time. Finding and removing the polyps early can help prevent this. What are the causes? The exact cause of colon polyps is not known. What increases the risk? You are more likely to develop this condition if you:  Have a family history of colon cancer or colon polyps.  Are older than 79 or older than 45 if you are African American.  Have inflammatory bowel disease, such as ulcerative colitis or Crohn's disease.  Have certain hereditary conditions, such as: ? Familial adenomatous polyposis. ? Lynch syndrome. ? Turcot syndrome. ? Peutz-Jeghers syndrome.  Are overweight.  Smoke cigarettes.  Do not get enough exercise.  Drink too much alcohol.  Eat a diet that is high in fat and red meat and low in fiber.  Had childhood cancer that was treated with abdominal radiation. What are the signs or symptoms? Most polyps do not cause symptoms. If you have symptoms, they may include:  Blood coming from your rectum when having a bowel movement.  Blood in your stool. The stool may look dark red or black.  Abdominal pain.  A change in bowel habits, such as constipation or diarrhea. How is this diagnosed? This condition is diagnosed with a colonoscopy. This is a procedure in which a lighted, flexible scope is inserted into the anus and then passed into the colon to examine the area. Polyps are sometimes found when a colonoscopy is done as part of routine cancer screening tests. How is this treated? Treatment for this condition involves removing any polyps that are found. Most polyps can be removed during a colonoscopy. Those polyps will then be tested for cancer.  Additional treatment may be needed depending on the results of testing. Follow these instructions at home: Lifestyle  Maintain a healthy weight, or lose weight if recommended by your health care provider.  Exercise every day or as told by your health care provider.  Do not use any products that contain nicotine or tobacco, such as cigarettes and e-cigarettes. If you need help quitting, ask your health care provider.  If you drink alcohol, limit how much you have: ? 0-1 drink a day for women. ? 0-2 drinks a day for men.  Be aware of how much alcohol is in your drink. In the [place] one drink equals one 12 oz bottle of beer (355 mL), one 5 oz glass of wine (148 mL), or one 1 oz shot of hard liquor (44 mL). Eating and drinking   Eat foods that are high in fiber, such as fruits, vegetables, and whole grains.  Eat foods that are high in calcium and vitamin D, such as milk, cheese, yogurt, eggs, liver, fish, and broccoli.  Limit foods that are high in fat, such as fried foods and desserts.  Limit the amount of red meat and processed meat you eat, such as hot dogs, sausage, bacon, and lunch meats. General instructions  Keep all follow-up visits as told by your health care provider. This is important. ? This includes having regularly scheduled colonoscopies. ? Talk to your health care provider about when you need a colonoscopy. Contact a health care provider if:  You have new or worsening bleeding during a bowel movement.  You have new or increased blood in your stool.  You have a change in bowel habits.  You lose weight for no known reason. Summary  Polyps are tissue growths inside the body. Polyps can grow in many places, including the colon.  Most colon polyps are noncancerous (benign), but some can become cancerous over time.  This condition is diagnosed with a colonoscopy.  Treatment for this condition involves removing any polyps that are found. Most polyps can be removed  during a colonoscopy. This information is not intended to replace advice given to you by your health care provider. Make sure you discuss any questions you have with your health care provider. Document Revised: 10/18/2017 Document Reviewed: 10/18/2017 Elsevier Patient Education  [place] After These instructions provide you with information about caring for yourself after your procedure. Your health care provider may also give you more specific instructions. Your treatment has been planned according to current medical practices, but problems sometimes occur. Call your health care provider if you have any problems or questions after your procedure. What can I expect after the procedure? After your procedure, you may:  Feel sleepy for several hours.  Feel clumsy and have poor balance for several hours.  Feel forgetful about what happened after the procedure.  Have poor judgment for several hours.  Feel nauseous or vomit.  Have a sore throat if you had a breathing tube during the procedure. Follow these instructions at home: For at least 24 hours after the procedure:      Have a responsible adult stay with you. It is important to have someone help care for you until you are awake and alert.  Rest as needed.  Do not: ? Participate in activities in which you could fall or become injured. ? Drive. ? Use heavy machinery. ? Drink alcohol. ? Take sleeping pills or medicines that cause drowsiness. ? Make important decisions or sign legal documents. ? Take care of children on your own. Eating and drinking  Follow the diet that is recommended by your health care provider.  If you vomit, drink water, juice, or soup when you can drink without vomiting.  Make sure you have little or no nausea before eating solid foods. General instructions  Take over-the-counter and prescription medicines only as told by your health care provider.  If  you have sleep apnea, surgery and certain medicines can increase your risk for breathing problems. Follow instructions from your health  care provider about wearing your sleep device: ? Anytime you are sleeping, including during daytime naps. ? While taking prescription pain medicines, sleeping medicines, or medicines that make you drowsy.  If you smoke, do not smoke without supervision.  Keep all follow-up visits as told by your health care provider. This is important. Contact a health care provider if:  You keep feeling nauseous or you keep vomiting.  You feel light-headed.  You develop a rash.  You have a fever. Get help right away if:  You have trouble breathing. Summary  For several hours after your procedure, you may feel sleepy and have poor judgment.  Have a responsible adult stay with you for at least 24 hours or until you are awake and alert. This information is not intended to replace advice given to you by your health care provider. Make sure you discuss any questions you have with your health care provider. Document Revised: 10/01/2017 Document Reviewed: 10/24/2015 Elsevier Patient Education  [place].

## 2020-04-06 NOTE — Op Note (Signed)
[facility] Patient Name: Robin Sherman Procedure Date: 04/06/2020 8:22 AM MRN: [id number] Date of Birth: 07-18-1960 Attending MD: Elon Alas. Edgar Frisk CSN: [id number] Age: 59 Admit Type: Outpatient Procedure:                Colonoscopy Indications:              Screening for colorectal malignant neoplasm Providers:                Elon Alas. Abbey Chatters, DO, Caprice Kluver, Crystal Page,                            Casimer Bilis, Technician, Aram Candela Referring MD:              Medicines:                See the Anesthesia note for documentation of the                            administered medications Complications:            No immediate complications. Estimated Blood Loss:     Estimated blood loss was minimal. Procedure:                Pre-Anesthesia Assessment:                           - The anesthesia plan was to use monitored                            anesthesia care (MAC).                           After obtaining informed consent, the colonoscope                            was passed under direct vision. Throughout the                            procedure, the patient's blood pressure, pulse, and                            oxygen saturations were monitored continuously. The                            PCF-H190DL ([id number]) scope was introduced through                            the anus and advanced to the the cecum, identified                            by appendiceal orifice and ileocecal valve. The                            colonoscopy was performed without difficulty. The                            patient tolerated  the procedure well. The quality                            of the bowel preparation was evaluated using the                            BBPS [facility] Bowel Preparation Scale) with scores                            of: Right Colon = 2 (minor amount of residual                            staining, small fragments of stool and/or opaque                             liquid, but mucosa seen well), Transverse Colon = 2                            (minor amount of residual staining, small fragments                            of stool and/or opaque liquid, but mucosa seen                            well) and Left Colon = 2 (minor amount of residual                            staining, small fragments of stool and/or opaque                            liquid, but mucosa seen well). The total BBPS score                            equals 6. The quality of the bowel preparation was                            fair. Scope In: 8:38:03 AM Scope Out: 4:33:29 AM Scope Withdrawal Time: 0 hours 6 minutes 36 seconds  Total Procedure Duration: 0 hours 18 minutes 16 seconds  Findings:      The perianal and digital rectal examinations were normal.      Non-bleeding internal hemorrhoids were found during endoscopy.      Many small and large-mouthed diverticula were found in the sigmoid colon       and descending colon.      A 2 mm polyp was found in the ascending colon. The polyp was sessile.       The polyp was removed with a jumbo cold forceps. Resection and retrieval       were complete. Impression:               - Preparation of the colon was fair.                           - Non-bleeding internal hemorrhoids.                           -  Diverticulosis in the sigmoid colon and in the                            descending colon.                           - One 2 mm polyp in the ascending colon, removed                            with a jumbo cold forceps. Resected and retrieved. Moderate Sedation:      Per Anesthesia Care Recommendation:           - Patient has a contact number available for                            emergencies. The signs and symptoms of potential                            delayed complications were discussed with the                            patient. Return to normal activities tomorrow.                            Written discharge  instructions were provided to the                            patient.                           - Resume previous diet.                           - Continue present medications.                           - Await pathology results.                           - Repeat colonoscopy in 5 years for surveillance.                           - Return to GI clinic PRN. Procedure Code(s):        --- Professional ---                           [phone], Colonoscopy, flexible; with biopsy, single                            or multiple Diagnosis Code(s):        --- Professional ---                           Z12.11, Encounter for screening for malignant  neoplasm of colon                           K64.8, Other hemorrhoids                           K63.5, Polyp of colon                           K57.30, Diverticulosis of large intestine without                            perforation or abscess without bleeding CPT copyright 2019 American Medical Association. All rights reserved. The codes documented in this report are preliminary and upon coder review may  be revised to meet current compliance requirements. Elon Alas. Abbey Chatters, [place] Zaveon Gillen, DO 04/06/2020 9:00:07 AM This report has been signed electronically. Number of Addenda: 0

## 2020-04-06 NOTE — H&P (Signed)
Primary Care Physician:  Alycia Rossetti, MD Primary Gastroenterologist:  Dr. Abbey Chatters  Pre-Procedure History & Physical: HPI:  Robin Sherman is a 59 y.o. female is here for a screening colonoscopy.   Past Medical History:  Diagnosis Date  . Allergy    pollen  . Arthritis    left knee  . Asthma   . Eczema    Followed by Dr. [place] Crane dermatology  . Headache(784.0) 09/25/2012  . Hypertension   . Neuromuscular disorder ([place])   . Sciatica of left side 10/13/2013  . TRIGGER FINGER 11/03/2008   Qualifier: Diagnosis of  By: Aline Brochure MD, Dorothyann Peng      Past Surgical History:  Procedure Laterality Date  . ABDOMINAL HYSTERECTOMY     bleeding  . BILATERAL SALPINGECTOMY Bilateral 08/01/2013   Procedure: BILATERAL SALPINGECTOMY;  Surgeon: Jonnie Kind, MD;  Location: [facility];  Service: Gynecology;  Laterality: Bilateral;  . CHONDROPLASTY Left 01/14/2014   Procedure: CHONDROPLASTY OF FEMUR;  Surgeon: Carole Civil, MD;  Location: [facility];  Service: Orthopedics;  Laterality: Left;  . CHONDROPLASTY Right 12/12/2018   Procedure: CHONDROPLASTY;  Surgeon: Carole Civil, MD;  Location: [facility];  Service: Orthopedics;  Laterality: Right;  . COLONOSCOPY N/A 01/27/2013   Dr. Oneida Alar: Right colon with poor prep (patient ate [place] necks the night before the procedure).  Moderate diverticulosis in the sigmoid colon, moderate hemorrhoids.  Marland Kitchen HEMATOMA EVACUATION N/A 08/03/2013   Procedure: EVACUATION PELVIC HEMATOMA;  Surgeon: Jonnie Kind, MD;  Location: [facility];  Service: Gynecology;  Laterality: N/A;  . KNEE ARTHROSCOPY WITH MEDIAL MENISECTOMY Left 01/14/2014   Procedure: KNEE ARTHROSCOPY WITH PARTIAL MEDIAL MENISECTOMY;  Surgeon: Carole Civil, MD;  Location: [facility];  Service: Orthopedics;  Laterality: Left;  . KNEE ARTHROSCOPY WITH MEDIAL MENISECTOMY Right 12/12/2018   Procedure: KNEE ARTHROSCOPY WITH MEDIAL MENISCECTOMY;  Surgeon: Carole Civil, MD;  Location: [facility];  Service: Orthopedics;   Laterality: Right;  . SUPRACERVICAL ABDOMINAL HYSTERECTOMY N/A 08/01/2013   Procedure: HYSTERECTOMY SUPRACERVICAL ABDOMINAL;  Surgeon: Jonnie Kind, MD;  Location: [facility];  Service: Gynecology;  Laterality: N/A;  . TUBAL LIGATION     [place]    Prior to Admission medications   Medication Sig Start Date End Date Taking? Authorizing Provider  albuterol (VENTOLIN HFA) 108 (90 Base) MCG/ACT inhaler Inhale 2 puffs into the lungs every 6 (six) hours as needed. 03/10/20  Yes [place], Modena Nunnery, MD  amLODipine (NORVASC) 10 MG tablet Take 1 tablet (10 mg total) by mouth daily. 09/28/18  Yes Caryl Ada K, PA-C  hydrochlorothiazide (HYDRODIURIL) 25 MG tablet Take 25 mg by mouth daily.   Yes [provider]  meloxicam (MOBIC) 7.5 MG tablet Take 1 tablet (7.5 mg total) by mouth daily. 10/22/19  Yes Carole Civil, MD  metFORMIN (GLUCOPHAGE) 500 MG tablet Take 1 tablet (500 mg total) by mouth daily with breakfast. 12/18/19  Yes [place], Modena Nunnery, MD  Oxycodone HCl 10 MG TABS Take 10 mg by mouth 2 (two) times daily as needed (pain).  03/23/20  Yes [provider]  pregabalin (LYRICA) 75 MG capsule TAKE (1) CAPSULE BY MOUTH TWICE DAILY. 03/26/20  Yes [place], Modena Nunnery, MD  oxyCODONE-acetaminophen (PERCOCET) 10-325 MG tablet Take 1 tablet by mouth every 8 (eight) hours as needed for pain. Patient not taking: Reported on 03/24/2020 03/10/20   Alycia Rossetti, MD  sulfamethoxazole-trimethoprim (BACTRIM DS) 800-160 MG tablet Take 1 tablet by mouth 2 (two) times daily.  Patient not taking: Reported on 03/24/2020 03/10/20   Alycia Rossetti, MD  Vitamin D, Ergocalciferol, (DRISDOL) 1.25 MG (50000 UNIT) CAPS capsule Take 1 capsule (50,000 Units total) by mouth every 7 (seven) days. x12 weeks, then D/C. When Tx complete, resume OTC Vit D 2000IU PO QD. Patient not taking: Reported on 03/24/2020 12/18/19   Alycia Rossetti, MD  lisinopril-hydrochlorothiazide (PRINZIDE,ZESTORETIC) 20-12.5 MG per tablet  Take 1 tablet by mouth daily.    09/21/11  [provider]    Allergies as of 02/23/2020 - Review Complete 02/09/2020  Allergen Reaction Noted  . Codeine Hives 03/18/2011  . Latex Rash 02/28/2015  . Naproxen Rash 11/27/2011    Family History  Problem Relation Age of Onset  . Heart disease Mother        MI  . Asthma Mother   . Hypertension Mother   . Stroke Father   . Heart disease Father        MI  . Hypertension Father   . Cancer Sister        breast  . Colon cancer Neg Hx     Social History   Socioeconomic History  . Marital status: Married    Spouse name: [name]  . Number of children: 1  . Years of education: 37  . Highest education level: Not on file  Occupational History  . Occupation: CNA  Tobacco Use  . Smoking status: Current Every Day Smoker    Packs/day: 0.50    Years: 20.00    Pack years: 10.00    Types: Cigarettes    Start date: 07/18/1979  . Smokeless tobacco: Never Used  . Tobacco comment: wants to quit  Vaping Use  . Vaping Use: Never used  Substance and Sexual Activity  . Alcohol use: Yes    Alcohol/week: 0.0 standard drinks    Comment: occ  . Drug use: No  . Sexual activity: Yes    Birth control/protection: Surgical  Other Topics Concern  . Not on file  Social History Narrative   Lives at home with [name]   Visit with family and grandchildren   Social Determinants of Health   Financial Resource Strain:   . Difficulty of Paying Living Expenses: Not on file  Food Insecurity:   . Worried About Charity fundraiser in the Last Year: Not on file  . Ran Out of Food in the Last Year: Not on file  Transportation Needs:   . Lack of Transportation (Medical): Not on file  . Lack of Transportation (Non-Medical): Not on file  Physical Activity:   . Days of Exercise per Week: Not on file  . Minutes of Exercise per Session: Not on file  Stress:   . Feeling of Stress : Not on file  Social Connections:   . Frequency of Communication  with Friends and Family: Not on file  . Frequency of Social Gatherings with Friends and Family: Not on file  . Attends Religious Services: Not on file  . Active Member of Clubs or Organizations: Not on file  . Attends Archivist Meetings: Not on file  . Marital Status: Not on file  Intimate Partner Violence:   . Fear of Current or Ex-Partner: Not on file  . Emotionally Abused: Not on file  . Physically Abused: Not on file  . Sexually Abused: Not on file    Review of Systems: See HPI, otherwise negative ROS  Impression/Plan: Robin Sherman is here for a colonoscopy to be performed  for screening  Risks, benefits, limitations, imponderables and alternatives regarding colonoscopy have been reviewed with the patient. Questions have been answered. All parties agreeable.

## 2020-04-06 NOTE — Anesthesia Preprocedure Evaluation (Addendum)
Anesthesia Evaluation  Patient identified by MRN, date of birth, ID band Patient awake    Reviewed: Allergy & Precautions, NPO status , Patient's Chart, lab work & pertinent test results  History of Anesthesia Complications Negative for: history of anesthetic complications  Airway Mallampati: II  TM Distance: >3 FB Neck ROM: Full    Dental  (+) Dental Advisory Given, Chipped, Missing   Pulmonary asthma , Current SmokerPatient did not abstain from smoking.,    Pulmonary exam normal breath sounds clear to auscultation       Cardiovascular Exercise Tolerance: Good hypertension, Pt. on medications Normal cardiovascular exam Rhythm:Regular Rate:Normal     Neuro/Psych  Headaches,  Neuromuscular disease    GI/Hepatic negative GI ROS, Neg liver ROS,   Endo/Other  negative endocrine ROS  Renal/GU negative Renal ROS     Musculoskeletal  (+) Arthritis ,   Abdominal   Peds  Hematology negative hematology ROS (+)   Anesthesia Other Findings   Reproductive/Obstetrics negative OB ROS                             Anesthesia Physical Anesthesia Plan  ASA: II  Anesthesia Plan: General   Post-op Pain Management:    Induction: Intravenous  PONV Risk Score and Plan: TIVA  Airway Management Planned: Nasal Cannula and Natural Airway  Additional Equipment:   Intra-op Plan:   Post-operative Plan:   Informed Consent: I have reviewed the patients History and Physical, chart, labs and discussed the procedure including the risks, benefits and alternatives for the proposed anesthesia with the patient or authorized representative who has indicated his/her understanding and acceptance.     Dental advisory given  Plan Discussed with: CRNA and Surgeon  Anesthesia Plan Comments:        Anesthesia Quick Evaluation

## 2020-04-06 NOTE — Anesthesia Postprocedure Evaluation (Signed)
Anesthesia Post Note  Patient: Robin Sherman  Procedure(s) Performed: COLONOSCOPY WITH PROPOFOL (N/A ) POLYPECTOMY  Patient location during evaluation: PACU Anesthesia Type: General Level of consciousness: awake, oriented, awake and alert and patient cooperative Pain management: satisfactory to patient Vital Signs Assessment: post-procedure vital signs reviewed and stable Respiratory status: spontaneous breathing, respiratory function stable and nonlabored ventilation Cardiovascular status: stable Postop Assessment: no apparent nausea or vomiting Anesthetic complications: no   No complications documented.   Last Vitals:  Vitals:   04/06/20 0734  BP: 122/78  Pulse: 93  Resp: 14  Temp: 37 C  SpO2: 98%    Last Pain:  Vitals:   04/06/20 0834  TempSrc:   PainSc: 0-No pain                 Franshesca Chipman

## 2020-04-06 NOTE — Transfer of Care (Signed)
Immediate Anesthesia Transfer of Care Note  Patient: Robin Sherman  Procedure(s) Performed: COLONOSCOPY WITH PROPOFOL (N/A ) POLYPECTOMY  Patient Location: PACU  Anesthesia Type:General  Level of Consciousness: awake, alert , oriented and patient cooperative  Airway & Oxygen Therapy: Patient Spontanous Breathing  Post-op Assessment: Report given to RN, Post -op Vital signs reviewed and stable and Patient moving all extremities X 4  Post vital signs: Reviewed and stable  Last Vitals:  Vitals Value Taken Time  BP    Temp    Pulse    Resp    SpO2      Last Pain:  Vitals:   04/06/20 0834  TempSrc:   PainSc: 0-No pain         Complications: No complications documented.

## 2020-04-08 LAB — SURGICAL PATHOLOGY

## 2020-04-09 ENCOUNTER — Other Ambulatory Visit: Payer: Self-pay | Admitting: *Deleted

## 2020-04-09 MED ORDER — AMLODIPINE BESYLATE 10 MG PO TABS
10.0000 mg | ORAL_TABLET | Freq: Every day | ORAL | 1 refills | Status: DC
Start: 2020-04-09 — End: 2020-09-20

## 2020-04-09 MED ORDER — OXYCODONE HCL 10 MG PO TABS
10.0000 mg | ORAL_TABLET | Freq: Two times a day (BID) | ORAL | 0 refills | Status: DC | PRN
Start: 2020-04-09 — End: 2020-05-28

## 2020-04-09 MED ORDER — LISINOPRIL-HYDROCHLOROTHIAZIDE 20-12.5 MG PO TABS: 1.0000 | ORAL_TABLET | Freq: Every day | ORAL | 1 refills | Status: DC

## 2020-04-09 NOTE — Telephone Encounter (Signed)
Received call from patient.   Requested refill on Oxycodone 10mg .   Ok to refill??  Last office visit 03/10/2020.  Last refill unknown

## 2020-04-12 ENCOUNTER — Encounter (HOSPITAL_COMMUNITY): Payer: Self-pay | Admitting: Internal Medicine

## 2020-04-21 ENCOUNTER — Encounter: Payer: Self-pay | Admitting: Family Medicine

## 2020-04-21 ENCOUNTER — Ambulatory Visit: Payer: BC Managed Care – PPO | Admitting: Family Medicine

## 2020-04-21 ENCOUNTER — Other Ambulatory Visit: Payer: Self-pay

## 2020-04-21 VITALS — BP 140/82 | HR 82 | Temp 98.1°F | Resp 16 | Ht 64.0 in | Wt 219.0 lb

## 2020-04-21 DIAGNOSIS — R21 Rash and other nonspecific skin eruption: Secondary | ICD-10-CM | POA: Diagnosis not present

## 2020-04-21 DIAGNOSIS — J302 Other seasonal allergic rhinitis: Secondary | ICD-10-CM

## 2020-04-21 DIAGNOSIS — M25561 Pain in right knee: Secondary | ICD-10-CM | POA: Diagnosis not present

## 2020-04-21 MED ORDER — CLOBETASOL PROPIONATE 0.05 % EX CREA: 1.0000 "application " | TOPICAL_CREAM | Freq: Two times a day (BID) | CUTANEOUS | 1 refills | Status: DC

## 2020-04-21 MED ORDER — MELOXICAM 7.5 MG PO TABS: 7.5000 mg | ORAL_TABLET | Freq: Every day | ORAL | 1 refills | Status: DC

## 2020-04-21 MED ORDER — HYDROXYZINE HCL 25 MG PO TABS: 25.0000 mg | ORAL_TABLET | Freq: Three times a day (TID) | ORAL | 1 refills | Status: DC | PRN

## 2020-04-21 NOTE — Progress Notes (Signed)
   Subjective:    Patient ID: Robin Sherman, female    DOB: 09-Apr-1961, 59 y.o.   MRN: [id number]  Patient presents for Rash (x1 week- itchy rash to B arms- no new exposures)  Here with rash to bilateral arms for the past week.  States that she did start itching when she got home from work.  No known new exposures.  No change in soap detergent lotion.  She has broken out the past that this was last a few years ago.  She was on hydroxyzine and did see dermatology around that time.  She used topical cortisone cream that helped a little she also use of Benadryl. She also had a very itchy patch on her neck but states that that went down with the cream.   Itchy throat, ear itching, no sinus pressure no HA, no fever, no cough  Review Of Systems:  GEN- denies fatigue, fever, weight loss,weakness, recent illness HEENT- denies eye drainage, change in vision, nasal discharge, CVS- denies chest pain, palpitations RESP- denies SOB, cough, wheeze ABD- denies N/V, change in stools, abd pain GU- denies dysuria, hematuria, dribbling, incontinence MSK- denies joint pain, muscle aches, injury Neuro- denies headache, dizziness, syncope, seizure activity       Objective:    BP 140/82   Pulse 82   Temp 98.1 F (36.7 C) (Temporal)   Resp 16   Ht 5\' 4"  (1.626 m)   Wt 219 lb (99.3 kg)   LMP 04/15/2013   SpO2 94%   BMI 37.59 kg/m  GEN- NAD, alert and oriented x3 HEENT- PERRL, EOMI, non injected sclera, pink conjunctiva, MMM, oropharynx clear, nares clear, no sinus tenderness,  Neck- Supple, no LAD  CVS- RRR, no murmur RESP-CTAB Skin- mild hives on forearm, dry patches, excoriations bil;at forearms, hands, palms spared, small patch right neck  EXT- No edema Pulses- Radial 2+        Assessment & Plan:      Problem List Items Addressed This Visit    None    Visit Diagnoses    Rash and nonspecific skin eruption    -  Primary   unclear cause, seems to be more contact based on distrubution,  given Depo Medrol IM, she declined oral steroids, given clobetaol and atarax  If recurrent referral to dermatology   Right knee pain, unspecified chronicity       Meloxicam refilled previously prescribed by her orthopedic   Relevant Medications   meloxicam (MOBIC) 7.5 MG tablet   Seasonal allergies       Patient prescribed hydroxyzine today this will help with allergy symptoms.      Note: This dictation was prepared with Dragon dictation along with smaller phrase technology. Any transcriptional errors that result from this process are unintentional.

## 2020-04-21 NOTE — Patient Instructions (Signed)
Use steroid cream twice a day Depo Medrol shot given  hydroxyzine for itching F/U as previous

## 2020-04-23 ENCOUNTER — Telehealth: Payer: Self-pay | Admitting: Orthopedic Surgery

## 2020-04-23 DIAGNOSIS — M5440 Lumbago with sciatica, unspecified side: Secondary | ICD-10-CM

## 2020-04-23 NOTE — Telephone Encounter (Signed)
Patient called today stating she wanted [place] to refer her to neurosurgeon now as she is having increased pain.  Would you do this for her please?  Thanks

## 2020-04-26 NOTE — Telephone Encounter (Signed)
We have already referred x2 to neurosurgeon, have put in again

## 2020-05-13 DIAGNOSIS — M4310 Spondylolisthesis, site unspecified: Secondary | ICD-10-CM | POA: Diagnosis not present

## 2020-05-14 ENCOUNTER — Other Ambulatory Visit: Payer: Self-pay | Admitting: Neurological Surgery

## 2020-05-20 ENCOUNTER — Other Ambulatory Visit: Payer: Self-pay

## 2020-05-20 ENCOUNTER — Ambulatory Visit: Payer: BC Managed Care – PPO | Admitting: Orthopedic Surgery

## 2020-05-20 VITALS — BP 157/105 | HR 90 | Ht 64.0 in | Wt 218.0 lb

## 2020-05-20 DIAGNOSIS — G8929 Other chronic pain: Secondary | ICD-10-CM | POA: Diagnosis not present

## 2020-05-20 DIAGNOSIS — Z9889 Other specified postprocedural states: Secondary | ICD-10-CM

## 2020-05-20 DIAGNOSIS — M25561 Pain in right knee: Secondary | ICD-10-CM | POA: Diagnosis not present

## 2020-05-20 DIAGNOSIS — M5136 Other intervertebral disc degeneration, lumbar region: Secondary | ICD-10-CM

## 2020-05-20 DIAGNOSIS — M5440 Lumbago with sciatica, unspecified side: Secondary | ICD-10-CM

## 2020-05-20 NOTE — Progress Notes (Addendum)
Chief Complaint  Patient presents with  . Knee Pain    right/ wants injection     Encounter Diagnoses  Name Primary?  . Low back pain with sciatica, sciatica laterality unspecified, unspecified back pain laterality, unspecified chronicity   . DDD (degenerative disc disease), lumbar   . S/P right knee arthroscopy 12/12/18 *with chondroplasty patella    . Chronic pain of right knee Yes   Procedure note right knee injection   verbal consent was obtained to inject right knee joint  Timeout was completed to confirm the site of injection  The medications used were 40 mg of Depo-Medrol and 1% lidocaine 3 cc  Anesthesia was provided by ethyl chloride and the skin was prepped with alcohol.  After cleaning the skin with alcohol a 20-gauge needle was used to inject the right knee joint. There were no complications. A sterile bandage was applied.

## 2020-05-20 NOTE — Patient Instructions (Signed)

## 2020-05-28 ENCOUNTER — Other Ambulatory Visit: Payer: Self-pay | Admitting: *Deleted

## 2020-05-28 NOTE — Telephone Encounter (Signed)
Received call from patient.   Requested refill on Oxycodone.   Ok to refill??  Last office visit 04/21/2020  Last refill 04/09/2020.

## 2020-05-31 MED ORDER — OXYCODONE HCL 10 MG PO TABS
10.0000 mg | ORAL_TABLET | Freq: Two times a day (BID) | ORAL | 0 refills | Status: DC | PRN
Start: 2020-05-31 — End: 2020-06-15

## 2020-06-09 NOTE — Progress Notes (Signed)
[place], [place] [address] Phone: [phone] Fax: [phone]      Your procedure is scheduled on Monday 06/14/2020.  Report to [facility] Main Entrance "A" at 06:00 A.M., and check in at the Admitting office.  Call this number if you have problems the morning of surgery:  [phone]  Call [phone] if you have any questions prior to your surgery date Monday-Friday 8am-4pm    Remember:  Do not eat or drink after midnight the night before your surgery    Take these medicines the morning of surgery with A SIP OF WATER: Amlodipine (Norvasc) Pregabalin (Lyrica)   If NEEDED you may take the following medications the morning of surgery: Albuterol (Ventolin) inhaler - Please bring all inhalers with you the day of surgery.  hydroxizine (Atarax/Vistaril) Oxycodone HCl  As of today, STOP taking any Meloxicam (Mobic), Aspirin (unless otherwise instructed by your surgeon) Aleve, Naproxen, Ibuprofen, Motrin, Advil, Goody's, BC's, all herbal medications, fish oil, and all vitamins.   WHAT DO I DO ABOUT MY DIABETES MEDICATION? Marland Kitchen Do not take oral diabetes medicines (pills) the morning of surgery. - do NOT take your metformin the morning of surgery.   HOW TO MANAGE YOUR DIABETES BEFORE AND AFTER SURGERY  Why is it important to control my blood sugar before and after surgery? . Improving blood sugar levels before and after surgery helps healing and can limit problems. . A way of improving blood sugar control is eating a healthy diet by: o  Eating less sugar and carbohydrates o  Increasing activity/exercise o  Talking with your doctor about reaching your blood sugar goals . High blood sugars (greater than 180 mg/dL) can raise your risk of infections and slow your recovery, so you will need to focus on controlling your diabetes during the weeks before surgery. . Make sure that the doctor who takes care of your  diabetes knows about your planned surgery including the date and location.  How do I manage my blood sugar before surgery? . Check your blood sugar at least 4 times a day, starting 2 days before surgery, to make sure that the level is not too high or low. . Check your blood sugar the morning of your surgery when you wake up and every 2 hours until you get to the Short Stay unit. o If your blood sugar is less than 70 mg/dL, you will need to treat for low blood sugar: - Do not take insulin. - Treat a low blood sugar (less than 70 mg/dL) with  cup of clear juice (cranberry or apple), 4 glucose tablets, OR glucose gel. - Recheck blood sugar in 15 minutes after treatment (to make sure it is greater than 70 mg/dL). If your blood sugar is not greater than 70 mg/dL on recheck, call [phone] for further instructions. . Report your blood sugar to the short stay nurse when you get to Short Stay.  . If you are admitted to the hospital after surgery: o Your blood sugar will be checked by the staff and you will probably be given insulin after surgery (instead of oral diabetes medicines) to make sure you have good blood sugar levels. o The goal for blood sugar control after surgery is 80-180 mg/dL.                      Do not wear jewelry, make up, or nail polish  Do not wear lotions, powders, perfumes, or deodorant.            Do not shave 48 hours prior to surgery.  Men may shave face and neck.            Do not bring valuables to the hospital.            [facility] is not responsible for any belongings or valuables.  Do NOT Smoke (Tobacco/Vaping) or drink Alcohol 24 hours prior to your procedure  If you use a CPAP at night, you may bring all equipment for your overnight stay.   Contacts, glasses, dentures or bridgework may not be worn into surgery.      For patients admitted to the hospital, discharge time will be determined by your treatment team.   Patients discharged the day of  surgery will not be allowed to drive home, and someone needs to stay with them for 24 hours.    Special instructions:   [facility]- Preparing For Surgery  Before surgery, you can play an important role. Because skin is not sterile, your skin needs to be as free of germs as possible. You can reduce the number of germs on your skin by washing with CHG (chlorahexidine gluconate) Soap before surgery.  CHG is an antiseptic cleaner which kills germs and bonds with the skin to continue killing germs even after washing.    Oral Hygiene is also important to reduce your risk of infection.  Remember - BRUSH YOUR TEETH THE MORNING OF SURGERY WITH YOUR REGULAR TOOTHPASTE  Please do not use if you have an allergy to CHG or antibacterial soaps. If your skin becomes reddened/irritated stop using the CHG.  Do not shave (including legs and underarms) for at least 48 hours prior to first CHG shower. It is OK to shave your face.  Please follow these instructions carefully.   1. Shower the NIGHT BEFORE SURGERY and the MORNING OF SURGERY with CHG Soap.   2. If you chose to wash your hair, wash your hair first as usual with your normal shampoo.  3. After you shampoo, rinse your hair and body thoroughly to remove the shampoo.  4. Use CHG as you would any other liquid soap. You can apply CHG directly to the skin and wash gently with a scrungie or a clean washcloth.   5. Apply the CHG Soap to your body ONLY FROM THE NECK DOWN.  Do not use on open wounds or open sores. Avoid contact with your eyes, ears, mouth and genitals (private parts). Wash Face and genitals (private parts)  with your normal soap.   6. Wash thoroughly, paying special attention to the area where your surgery will be performed.  7. Thoroughly rinse your body with warm water from the neck down.  8. DO NOT shower/wash with your normal soap after using and rinsing off the CHG Soap.  9. Pat yourself dry with a CLEAN TOWEL.  10. Wear CLEAN  PAJAMAS to bed the night before surgery  11. Place CLEAN SHEETS on your bed the night of your first shower and DO NOT SLEEP WITH PETS.   Day of Surgery: Shower with CHG soap as directed Wear Clean/Comfortable clothing the morning of surgery Do not apply any deodorants/lotions.   Remember to brush your teeth WITH YOUR REGULAR TOOTHPASTE.   Please read over the following fact sheets that you were given.

## 2020-06-11 ENCOUNTER — Other Ambulatory Visit (HOSPITAL_COMMUNITY)
Admission: RE | Admit: 2020-06-11 | Discharge: 2020-06-11 | Disposition: A | Discharge status: Home / Self Care | Payer: BC Managed Care – PPO | Source: Ambulatory Visit | Attending: Neurological Surgery | Admitting: Neurological Surgery

## 2020-06-11 ENCOUNTER — Other Ambulatory Visit: Payer: Self-pay

## 2020-06-11 ENCOUNTER — Encounter (HOSPITAL_COMMUNITY): Payer: Self-pay

## 2020-06-11 ENCOUNTER — Encounter (HOSPITAL_COMMUNITY)
Admission: RE | Admit: 2020-06-11 | Discharge: 2020-06-11 | Disposition: A | Discharge status: Home / Self Care | Payer: BC Managed Care – PPO | Source: Ambulatory Visit | Attending: Neurological Surgery | Admitting: Neurological Surgery

## 2020-06-11 DIAGNOSIS — Z20822 Contact with and (suspected) exposure to covid-19: Secondary | ICD-10-CM | POA: Insufficient documentation

## 2020-06-11 DIAGNOSIS — M4807 Spinal stenosis, lumbosacral region: Secondary | ICD-10-CM | POA: Diagnosis not present

## 2020-06-11 DIAGNOSIS — L309 Dermatitis, unspecified: Secondary | ICD-10-CM | POA: Diagnosis not present

## 2020-06-11 DIAGNOSIS — M4326 Fusion of spine, lumbar region: Secondary | ICD-10-CM | POA: Diagnosis not present

## 2020-06-11 DIAGNOSIS — F17210 Nicotine dependence, cigarettes, uncomplicated: Secondary | ICD-10-CM | POA: Diagnosis not present

## 2020-06-11 DIAGNOSIS — M4316 Spondylolisthesis, lumbar region: Secondary | ICD-10-CM | POA: Diagnosis not present

## 2020-06-11 DIAGNOSIS — M48061 Spinal stenosis, lumbar region without neurogenic claudication: Secondary | ICD-10-CM | POA: Diagnosis not present

## 2020-06-11 DIAGNOSIS — Z803 Family history of malignant neoplasm of breast: Secondary | ICD-10-CM | POA: Diagnosis not present

## 2020-06-11 DIAGNOSIS — Z01812 Encounter for preprocedural laboratory examination: Secondary | ICD-10-CM | POA: Insufficient documentation

## 2020-06-11 DIAGNOSIS — Z825 Family history of asthma and other chronic lower respiratory diseases: Secondary | ICD-10-CM | POA: Diagnosis not present

## 2020-06-11 DIAGNOSIS — M5116 Intervertebral disc disorders with radiculopathy, lumbar region: Secondary | ICD-10-CM | POA: Diagnosis not present

## 2020-06-11 DIAGNOSIS — Z823 Family history of stroke: Secondary | ICD-10-CM | POA: Diagnosis not present

## 2020-06-11 DIAGNOSIS — M5417 Radiculopathy, lumbosacral region: Secondary | ICD-10-CM | POA: Diagnosis not present

## 2020-06-11 DIAGNOSIS — M4317 Spondylolisthesis, lumbosacral region: Secondary | ICD-10-CM | POA: Diagnosis not present

## 2020-06-11 DIAGNOSIS — I10 Essential (primary) hypertension: Secondary | ICD-10-CM | POA: Diagnosis not present

## 2020-06-11 DIAGNOSIS — Z8249 Family history of ischemic heart disease and other diseases of the circulatory system: Secondary | ICD-10-CM | POA: Diagnosis not present

## 2020-06-11 DIAGNOSIS — M9973 Connective tissue and disc stenosis of intervertebral foramina of lumbar region: Secondary | ICD-10-CM | POA: Diagnosis not present

## 2020-06-11 DIAGNOSIS — M1712 Unilateral primary osteoarthritis, left knee: Secondary | ICD-10-CM | POA: Diagnosis not present

## 2020-06-11 HISTORY — DX: Prediabetes: R73.03

## 2020-06-11 LAB — BASIC METABOLIC PANEL
Anion gap: 10 (ref 5–15)
BUN: 9 mg/dL (ref 6–20)
CO2: 22 mmol/L (ref 22–32)
Calcium: 9.3 mg/dL (ref 8.9–10.3)
Chloride: 108 mmol/L (ref 98–111)
Creatinine, Ser: 0.6 mg/dL (ref 0.44–1.00)
GFR, Estimated: >60 mL/min (ref >60)
Glucose, Bld: 94 mg/dL (ref 70–99)
Potassium: 4.1 mmol/L (ref 3.5–5.1)
Sodium: 140 mmol/L (ref 135–145)

## 2020-06-11 LAB — CBC
HCT: 42.8 % (ref 36.0–46.0)
Hemoglobin: 13.4 g/dL (ref 12.0–15.0)
MCH: 27.6 pg (ref 26.0–34.0)
MCHC: 31.3 g/dL (ref 30.0–36.0)
MCV: 88.2 fL (ref 80.0–100.0)
Platelets: 337 10*3/uL (ref 150–400)
RBC: 4.85 MIL/uL (ref 3.87–5.11)
RDW: 14.5 % (ref 11.5–15.5)
WBC: 8.7 10*3/uL (ref 4.0–10.5)
nRBC: 0 % (ref 0.0–0.2)

## 2020-06-11 LAB — HEMOGLOBIN A1C
Hgb A1c MFr Bld: 6.1 % — ABNORMAL HIGH (ref 4.8–5.6)
Mean Plasma Glucose: 128.37 mg/dL

## 2020-06-11 LAB — TYPE AND SCREEN
ABO/RH(D): O POS
Antibody Screen: NEGATIVE

## 2020-06-11 LAB — SURGICAL PCR SCREEN
MRSA, PCR: NEGATIVE
Staphylococcus aureus: NEGATIVE

## 2020-06-11 LAB — SARS CORONAVIRUS 2 (TAT 6-24 HRS): SARS Coronavirus 2: NEGATIVE

## 2020-06-11 LAB — GLUCOSE, CAPILLARY: Glucose-Capillary: 106 mg/dL — ABNORMAL HIGH (ref 70–99)

## 2020-06-11 NOTE — Progress Notes (Signed)
PCP - dr. Vic Blackbird Cardiologist - Denies  Chest x-ray - Not indicated EKG - 04/02/20 Stress Test - Denies ECHO - denies Cardiac Cath - denies  Sleep Study - denies  Prediabetic  CBG at PAT appt was 106 Fasting Blood Sugar - Averaging 80- 105 Checks Blood Sugar ___2__ times a day  COVID TEST- 06/11/20  Anesthesia review: No  Patient denies shortness of breath, fever, cough and chest pain at PAT appointment   All instructions explained to the patient, with a verbal understanding of the material. Patient agrees to go over the instructions while at home for a better understanding. Patient also instructed to self quarantine after being tested for COVID-19. The opportunity to ask questions was provided.

## 2020-06-14 ENCOUNTER — Inpatient Hospital Stay (HOSPITAL_COMMUNITY): Payer: BC Managed Care – PPO | Admitting: Certified Registered Nurse Anesthetist

## 2020-06-14 ENCOUNTER — Encounter (HOSPITAL_COMMUNITY)
Admission: RE | Disposition: A | Discharge status: Home / Self Care | Payer: Self-pay | Source: Home / Self Care | Attending: Neurological Surgery

## 2020-06-14 ENCOUNTER — Inpatient Hospital Stay (HOSPITAL_COMMUNITY): Payer: BC Managed Care – PPO

## 2020-06-14 ENCOUNTER — Inpatient Hospital Stay (HOSPITAL_COMMUNITY)
Admission: RE | Admit: 2020-06-14 | Discharge: 2020-06-15 | DRG: 460 | Disposition: A | Discharge status: Home / Self Care | Payer: BC Managed Care – PPO | Attending: Neurological Surgery | Admitting: Neurological Surgery

## 2020-06-14 ENCOUNTER — Encounter (HOSPITAL_COMMUNITY): Payer: Self-pay | Admitting: Neurological Surgery

## 2020-06-14 DIAGNOSIS — F17210 Nicotine dependence, cigarettes, uncomplicated: Secondary | ICD-10-CM | POA: Diagnosis present

## 2020-06-14 DIAGNOSIS — Z20822 Contact with and (suspected) exposure to covid-19: Secondary | ICD-10-CM | POA: Diagnosis present

## 2020-06-14 DIAGNOSIS — M4807 Spinal stenosis, lumbosacral region: Secondary | ICD-10-CM | POA: Diagnosis present

## 2020-06-14 DIAGNOSIS — Z8249 Family history of ischemic heart disease and other diseases of the circulatory system: Secondary | ICD-10-CM | POA: Diagnosis not present

## 2020-06-14 DIAGNOSIS — Z823 Family history of stroke: Secondary | ICD-10-CM

## 2020-06-14 DIAGNOSIS — Z803 Family history of malignant neoplasm of breast: Secondary | ICD-10-CM

## 2020-06-14 DIAGNOSIS — M4326 Fusion of spine, lumbar region: Secondary | ICD-10-CM | POA: Diagnosis not present

## 2020-06-14 DIAGNOSIS — M4316 Spondylolisthesis, lumbar region: Principal | ICD-10-CM | POA: Diagnosis present

## 2020-06-14 DIAGNOSIS — Z825 Family history of asthma and other chronic lower respiratory diseases: Secondary | ICD-10-CM | POA: Diagnosis not present

## 2020-06-14 DIAGNOSIS — M5417 Radiculopathy, lumbosacral region: Secondary | ICD-10-CM | POA: Diagnosis present

## 2020-06-14 DIAGNOSIS — I10 Essential (primary) hypertension: Secondary | ICD-10-CM | POA: Diagnosis present

## 2020-06-14 DIAGNOSIS — Z419 Encounter for procedure for purposes other than remedying health state, unspecified: Secondary | ICD-10-CM

## 2020-06-14 DIAGNOSIS — L309 Dermatitis, unspecified: Secondary | ICD-10-CM | POA: Diagnosis present

## 2020-06-14 DIAGNOSIS — M25561 Pain in right knee: Secondary | ICD-10-CM

## 2020-06-14 DIAGNOSIS — M1712 Unilateral primary osteoarthritis, left knee: Secondary | ICD-10-CM | POA: Diagnosis present

## 2020-06-14 DIAGNOSIS — M4310 Spondylolisthesis, site unspecified: Secondary | ICD-10-CM

## 2020-06-14 HISTORY — PX: TRANSFORAMINAL LUMBAR INTERBODY FUSION W/ MIS 1 LEVEL: SHX6145

## 2020-06-14 LAB — GLUCOSE, CAPILLARY
Glucose-Capillary: 115 mg/dL — ABNORMAL HIGH (ref 70–99)
Glucose-Capillary: 232 mg/dL — ABNORMAL HIGH (ref 70–99)
Glucose-Capillary: 137 mg/dL — ABNORMAL HIGH (ref 70–99)

## 2020-06-14 SURGERY — MINIMALLY INVASIVE (MIS) TRANSFORAMINAL LUMBAR INTERBODY FUSION (TLIF) 1 LEVEL
Anesthesia: General | Laterality: Right

## 2020-06-14 MED ORDER — FENTANYL CITRATE (PF) 250 MCG/5ML IJ SOLN
INTRAMUSCULAR | Status: AC
  Filled 2020-06-14: qty 5

## 2020-06-14 MED ORDER — HYDROXYZINE HCL 25 MG PO TABS
25.0000 mg | ORAL_TABLET | Freq: Three times a day (TID) | ORAL | Status: DC | PRN
  Administered 2020-06-15: 25 mg via ORAL
  Filled 2020-06-14: qty 1

## 2020-06-14 MED ORDER — ROCURONIUM BROMIDE 10 MG/ML (PF) SYRINGE
PREFILLED_SYRINGE | INTRAVENOUS | Status: DC | PRN
  Administered 2020-06-14: 20 mg via INTRAVENOUS
  Administered 2020-06-14: 10 mg via INTRAVENOUS
  Administered 2020-06-14: 70 mg via INTRAVENOUS
  Administered 2020-06-14: 10 mg via INTRAVENOUS
  Administered 2020-06-14: 30 mg via INTRAVENOUS

## 2020-06-14 MED ORDER — FENTANYL CITRATE (PF) 100 MCG/2ML IJ SOLN
INTRAMUSCULAR | Status: AC
  Filled 2020-06-14: qty 2

## 2020-06-14 MED ORDER — PROPOFOL 10 MG/ML IV BOLUS
INTRAVENOUS | Status: AC
  Filled 2020-06-14: qty 40

## 2020-06-14 MED ORDER — PREGABALIN 75 MG PO CAPS
75.0000 mg | ORAL_CAPSULE | Freq: Two times a day (BID) | ORAL | Status: DC
  Administered 2020-06-14 – 2020-06-15 (×2): 75 mg via ORAL
  Filled 2020-06-14 (×2): qty 1

## 2020-06-14 MED ORDER — INSULIN ASPART 100 UNIT/ML ~~LOC~~ SOLN: 0.0000 [IU] | Freq: Every day | SUBCUTANEOUS | Status: DC

## 2020-06-14 MED ORDER — ACETAMINOPHEN 650 MG RE SUPP: 650.0000 mg | RECTAL | Status: DC | PRN

## 2020-06-14 MED ORDER — CYCLOBENZAPRINE HCL 10 MG PO TABS
10.0000 mg | ORAL_TABLET | Freq: Three times a day (TID) | ORAL | Status: DC | PRN
  Administered 2020-06-14 (×2): 10 mg via ORAL
  Filled 2020-06-14: qty 1

## 2020-06-14 MED ORDER — MIDAZOLAM HCL 2 MG/2ML IJ SOLN
INTRAMUSCULAR | Status: DC | PRN
  Administered 2020-06-14: 2 mg via INTRAVENOUS

## 2020-06-14 MED ORDER — DIPHENHYDRAMINE HCL 25 MG PO CAPS
50.0000 mg | ORAL_CAPSULE | Freq: Four times a day (QID) | ORAL | Status: DC | PRN
  Administered 2020-06-14 – 2020-06-15 (×2): 50 mg via ORAL
  Filled 2020-06-14 (×2): qty 2

## 2020-06-14 MED ORDER — SODIUM CHLORIDE 0.9% FLUSH: 3.0000 mL | INTRAVENOUS | Status: DC | PRN

## 2020-06-14 MED ORDER — AMLODIPINE BESYLATE 5 MG PO TABS
10.0000 mg | ORAL_TABLET | Freq: Every day | ORAL | Status: DC
  Administered 2020-06-15: 10 mg via ORAL
  Filled 2020-06-14: qty 2

## 2020-06-14 MED ORDER — ACETAMINOPHEN 325 MG PO TABS
650.0000 mg | ORAL_TABLET | ORAL | Status: DC | PRN
  Administered 2020-06-14 – 2020-06-15 (×2): 650 mg via ORAL
  Filled 2020-06-14 (×2): qty 2

## 2020-06-14 MED ORDER — 0.9 % SODIUM CHLORIDE (POUR BTL) OPTIME
TOPICAL | Status: DC | PRN
  Administered 2020-06-14: 1000 mL

## 2020-06-14 MED ORDER — HYDROMORPHONE HCL 1 MG/ML IJ SOLN
1.0000 mg | INTRAMUSCULAR | Status: DC | PRN
  Administered 2020-06-14: 1 mg via INTRAVENOUS
  Filled 2020-06-14: qty 1

## 2020-06-14 MED ORDER — POLYETHYLENE GLYCOL 3350 17 G PO PACK: 17.0000 g | PACK | Freq: Every day | ORAL | Status: DC | PRN

## 2020-06-14 MED ORDER — OXYCODONE HCL 5 MG PO TABS
10.0000 mg | ORAL_TABLET | ORAL | Status: DC | PRN
  Administered 2020-06-14 – 2020-06-15 (×5): 10 mg via ORAL
  Filled 2020-06-14 (×5): qty 2

## 2020-06-14 MED ORDER — INSULIN ASPART 100 UNIT/ML ~~LOC~~ SOLN: 0.0000 [IU] | Freq: Three times a day (TID) | SUBCUTANEOUS | Status: DC

## 2020-06-14 MED ORDER — ONDANSETRON HCL 4 MG/2ML IJ SOLN
INTRAMUSCULAR | Status: AC
  Filled 2020-06-14: qty 2

## 2020-06-14 MED ORDER — METFORMIN HCL 500 MG PO TABS
500.0000 mg | ORAL_TABLET | Freq: Every day | ORAL | Status: DC
  Administered 2020-06-15: 500 mg via ORAL
  Filled 2020-06-14: qty 1

## 2020-06-14 MED ORDER — THROMBIN 5000 UNITS EX SOLR
CUTANEOUS | Status: AC
  Filled 2020-06-14: qty 5000

## 2020-06-14 MED ORDER — ALBUTEROL SULFATE HFA 108 (90 BASE) MCG/ACT IN AERS
2.0000 | INHALATION_SPRAY | Freq: Four times a day (QID) | RESPIRATORY_TRACT | Status: DC | PRN
  Administered 2020-06-15: 2 via RESPIRATORY_TRACT
  Filled 2020-06-14: qty 6.7

## 2020-06-14 MED ORDER — ACETAMINOPHEN 10 MG/ML IV SOLN
INTRAVENOUS | Status: AC
  Filled 2020-06-14: qty 100

## 2020-06-14 MED ORDER — CHLORHEXIDINE GLUCONATE CLOTH 2 % EX PADS: 6.0000 | MEDICATED_PAD | Freq: Once | CUTANEOUS | Status: DC

## 2020-06-14 MED ORDER — SODIUM CHLORIDE 0.9 % IV SOLN
250.0000 mL | INTRAVENOUS | Status: DC
  Administered 2020-06-14: 250 mL via INTRAVENOUS

## 2020-06-14 MED ORDER — ONDANSETRON HCL 4 MG/2ML IJ SOLN: 4.0000 mg | Freq: Once | INTRAMUSCULAR | Status: DC | PRN

## 2020-06-14 MED ORDER — CHLORHEXIDINE GLUCONATE 0.12 % MT SOLN
15.0000 mL | Freq: Once | OROMUCOSAL | Status: AC
  Administered 2020-06-14: 15 mL via OROMUCOSAL
  Filled 2020-06-14: qty 15

## 2020-06-14 MED ORDER — PHENOL 1.4 % MT LIQD: 1.0000 | OROMUCOSAL | Status: DC | PRN

## 2020-06-14 MED ORDER — PHENYLEPHRINE HCL-NACL 10-0.9 MG/250ML-% IV SOLN
INTRAVENOUS | Status: DC | PRN
  Administered 2020-06-14: 40 ug/min via INTRAVENOUS

## 2020-06-14 MED ORDER — DEXMEDETOMIDINE (PRECEDEX) IN NS 20 MCG/5ML (4 MCG/ML) IV SYRINGE
PREFILLED_SYRINGE | INTRAVENOUS | Status: AC
  Filled 2020-06-14: qty 5

## 2020-06-14 MED ORDER — ROCURONIUM BROMIDE 10 MG/ML (PF) SYRINGE
PREFILLED_SYRINGE | INTRAVENOUS | Status: AC
  Filled 2020-06-14: qty 10

## 2020-06-14 MED ORDER — LACTATED RINGERS IV SOLN: INTRAVENOUS | Status: DC

## 2020-06-14 MED ORDER — ACETAMINOPHEN 10 MG/ML IV SOLN
INTRAVENOUS | Status: DC | PRN
  Administered 2020-06-14: 1000 mg via INTRAVENOUS

## 2020-06-14 MED ORDER — SUGAMMADEX SODIUM 200 MG/2ML IV SOLN
INTRAVENOUS | Status: DC | PRN
  Administered 2020-06-14: 200 mg via INTRAVENOUS

## 2020-06-14 MED ORDER — ONDANSETRON HCL 4 MG PO TABS: 4.0000 mg | ORAL_TABLET | Freq: Four times a day (QID) | ORAL | Status: DC | PRN

## 2020-06-14 MED ORDER — LIDOCAINE-EPINEPHRINE 1 %-1:100000 IJ SOLN
INTRAMUSCULAR | Status: AC
  Filled 2020-06-14: qty 1

## 2020-06-14 MED ORDER — LISINOPRIL 20 MG PO TABS
20.0000 mg | ORAL_TABLET | Freq: Every day | ORAL | Status: DC
  Administered 2020-06-14 – 2020-06-15 (×2): 20 mg via ORAL
  Filled 2020-06-14 (×2): qty 1

## 2020-06-14 MED ORDER — DEXAMETHASONE SODIUM PHOSPHATE 10 MG/ML IJ SOLN
INTRAMUSCULAR | Status: AC
  Filled 2020-06-14: qty 1

## 2020-06-14 MED ORDER — CEFAZOLIN SODIUM-DEXTROSE 2-4 GM/100ML-% IV SOLN
2.0000 g | Freq: Three times a day (TID) | INTRAVENOUS | Status: AC
  Administered 2020-06-14 (×2): 2 g via INTRAVENOUS
  Filled 2020-06-14 (×2): qty 100

## 2020-06-14 MED ORDER — FENTANYL CITRATE (PF) 250 MCG/5ML IJ SOLN
INTRAMUSCULAR | Status: DC | PRN
  Administered 2020-06-14 (×7): 50 ug via INTRAVENOUS

## 2020-06-14 MED ORDER — ONDANSETRON HCL 4 MG/2ML IJ SOLN: 4.0000 mg | Freq: Four times a day (QID) | INTRAMUSCULAR | Status: DC | PRN

## 2020-06-14 MED ORDER — DEXAMETHASONE SODIUM PHOSPHATE 10 MG/ML IJ SOLN
INTRAMUSCULAR | Status: DC | PRN
  Administered 2020-06-14: 5 mg via INTRAVENOUS

## 2020-06-14 MED ORDER — CYCLOBENZAPRINE HCL 10 MG PO TABS
ORAL_TABLET | ORAL | Status: AC
  Filled 2020-06-14: qty 1

## 2020-06-14 MED ORDER — HYDROCHLOROTHIAZIDE 12.5 MG PO CAPS
12.5000 mg | ORAL_CAPSULE | Freq: Every day | ORAL | Status: DC
  Administered 2020-06-14 – 2020-06-15 (×2): 12.5 mg via ORAL
  Filled 2020-06-14 (×2): qty 1

## 2020-06-14 MED ORDER — THROMBIN 5000 UNITS EX SOLR
OROMUCOSAL | Status: DC | PRN
  Administered 2020-06-14: 5 mL via TOPICAL

## 2020-06-14 MED ORDER — OXYCODONE HCL 5 MG PO TABS: 5.0000 mg | ORAL_TABLET | ORAL | Status: DC | PRN

## 2020-06-14 MED ORDER — CEFAZOLIN SODIUM-DEXTROSE 2-4 GM/100ML-% IV SOLN
2.0000 g | INTRAVENOUS | Status: AC
  Administered 2020-06-14: 2 g via INTRAVENOUS
  Filled 2020-06-14: qty 100

## 2020-06-14 MED ORDER — ONDANSETRON HCL 4 MG/2ML IJ SOLN
INTRAMUSCULAR | Status: DC | PRN
  Administered 2020-06-14: 4 mg via INTRAVENOUS

## 2020-06-14 MED ORDER — MENTHOL 3 MG MT LOZG: 1.0000 | LOZENGE | OROMUCOSAL | Status: DC | PRN

## 2020-06-14 MED ORDER — WHITE PETROLATUM EX OINT
TOPICAL_OINTMENT | CUTANEOUS | Status: AC
  Filled 2020-06-14: qty 28.35

## 2020-06-14 MED ORDER — DOCUSATE SODIUM 100 MG PO CAPS
100.0000 mg | ORAL_CAPSULE | Freq: Two times a day (BID) | ORAL | Status: DC
  Administered 2020-06-14 – 2020-06-15 (×3): 100 mg via ORAL
  Filled 2020-06-14 (×3): qty 1

## 2020-06-14 MED ORDER — LISINOPRIL-HYDROCHLOROTHIAZIDE 20-12.5 MG PO TABS: 1.0000 | ORAL_TABLET | Freq: Every day | ORAL | Status: DC

## 2020-06-14 MED ORDER — MIDAZOLAM HCL 2 MG/2ML IJ SOLN
INTRAMUSCULAR | Status: AC
  Filled 2020-06-14: qty 2

## 2020-06-14 MED ORDER — LIDOCAINE-EPINEPHRINE 1 %-1:100000 IJ SOLN
INTRAMUSCULAR | Status: DC | PRN
  Administered 2020-06-14: 10 mL

## 2020-06-14 MED ORDER — METHOCARBAMOL 500 MG PO TABS
ORAL_TABLET | ORAL | Status: AC
  Filled 2020-06-14: qty 1

## 2020-06-14 MED ORDER — ORAL CARE MOUTH RINSE: 15.0000 mL | Freq: Once | OROMUCOSAL | Status: AC

## 2020-06-14 MED ORDER — PROPOFOL 10 MG/ML IV BOLUS
INTRAVENOUS | Status: DC | PRN
  Administered 2020-06-14: 130 mg via INTRAVENOUS

## 2020-06-14 MED ORDER — FENTANYL CITRATE (PF) 100 MCG/2ML IJ SOLN
25.0000 ug | INTRAMUSCULAR | Status: DC | PRN
  Administered 2020-06-14 (×3): 50 ug via INTRAVENOUS

## 2020-06-14 MED ORDER — ALBUMIN HUMAN 5 % IV SOLN: INTRAVENOUS | Status: DC | PRN

## 2020-06-14 MED ORDER — LIDOCAINE HCL (PF) 2 % IJ SOLN
INTRAMUSCULAR | Status: AC
  Filled 2020-06-14: qty 5

## 2020-06-14 MED ORDER — SODIUM CHLORIDE 0.9% FLUSH
3.0000 mL | Freq: Two times a day (BID) | INTRAVENOUS | Status: DC
  Administered 2020-06-14 (×2): 3 mL via INTRAVENOUS

## 2020-06-14 MED ORDER — DEXMEDETOMIDINE (PRECEDEX) IN NS 20 MCG/5ML (4 MCG/ML) IV SYRINGE
PREFILLED_SYRINGE | INTRAVENOUS | Status: DC | PRN
  Administered 2020-06-14: 4 ug via INTRAVENOUS
  Administered 2020-06-14: 8 ug via INTRAVENOUS
  Administered 2020-06-14: 4 ug via INTRAVENOUS
  Administered 2020-06-14: 8 ug via INTRAVENOUS
  Administered 2020-06-14 (×2): 4 ug via INTRAVENOUS

## 2020-06-14 MED ORDER — LIDOCAINE 2% (20 MG/ML) 5 ML SYRINGE
INTRAMUSCULAR | Status: DC | PRN
  Administered 2020-06-14: 40 mg via INTRAVENOUS

## 2020-06-14 SURGICAL SUPPLY — 74 items
ADH SKN CLS APL DERMABOND .7 (GAUZE/BANDAGES/DRESSINGS) ×1
BAND INSRT 18 STRL LF DISP RB (MISCELLANEOUS) ×2
BAND RUBBER #18 3X1/16 STRL (MISCELLANEOUS) ×6 IMPLANT
BASKET BONE COLLECTION (BASKET) ×3 IMPLANT
BLADE CLIPPER SURG (BLADE) IMPLANT
BLADE SURG 11 STRL SS (BLADE) ×3 IMPLANT
BUR 2.5 MTCH HD 16 (BUR) ×1 IMPLANT
BUR 2.5MM MTCH HD 16CM (BUR) ×1
BUR MATCHSTICK NEURO 3.0 LAGG (BURR) IMPLANT
BUR PRECISION FLUTE 5.0 (BURR) ×2 IMPLANT
BUR PRECISION MATCH 3.0 13 (BURR) ×3 IMPLANT
BUR PRECISION MATCH 3.0 13CM (BURR) ×2
CNTNR URN SCR LID CUP LEK RST (MISCELLANEOUS) ×1 IMPLANT
CONT SPEC 4OZ STRL OR WHT (MISCELLANEOUS) ×3
COVER BACK TABLE 60X90IN (DRAPES) ×3 IMPLANT
COVER WAND RF STERILE (DRAPES) ×1 IMPLANT
DECANTER SPIKE VIAL GLASS SM (MISCELLANEOUS) ×3 IMPLANT
DERMABOND ADVANCED (GAUZE/BANDAGES/DRESSINGS) ×2
DERMABOND ADVANCED .7 DNX12 (GAUZE/BANDAGES/DRESSINGS) ×1 IMPLANT
DEVICE INTERBODY ELEVATE 23X7 (Cage) ×2 IMPLANT
DRAPE C-ARM 42X72 X-RAY (DRAPES) ×3 IMPLANT
DRAPE C-ARMOR (DRAPES) ×3 IMPLANT
DRAPE LAPAROTOMY 100X72X124 (DRAPES) ×3 IMPLANT
DRAPE MICROSCOPE LEICA (MISCELLANEOUS) ×3 IMPLANT
DRAPE SURG 17X23 STRL (DRAPES) ×6 IMPLANT
ELECT BLADE 6.5 EXT (BLADE) ×3 IMPLANT
ELECT REM PT RETURN 9FT ADLT (ELECTROSURGICAL) ×3
ELECTRODE REM PT RTRN 9FT ADLT (ELECTROSURGICAL) ×1 IMPLANT
EXTENDER TAB GUIDE SV 5.5/6.0 (INSTRUMENTS) ×16 IMPLANT
GAUZE 4X4 16PLY RFD (DISPOSABLE) IMPLANT
GAUZE SPONGE 4X4 12PLY STRL (GAUZE/BANDAGES/DRESSINGS) ×3 IMPLANT
GLOVE BIO SURGEON STRL SZ 6.5 (GLOVE) ×1 IMPLANT
GLOVE BIO SURGEON STRL SZ7.5 (GLOVE) ×1 IMPLANT
GLOVE BIO SURGEONS STRL SZ 6.5 (GLOVE)
GLOVE BIOGEL PI IND STRL 6.5 (GLOVE) ×1 IMPLANT
GLOVE BIOGEL PI IND STRL 7.5 (GLOVE) ×1 IMPLANT
GLOVE BIOGEL PI INDICATOR 6.5 (GLOVE)
GLOVE BIOGEL PI INDICATOR 7.5 (GLOVE) ×2
GLOVE SS N UNI LF 7.5 STRL (GLOVE) ×4 IMPLANT
GOWN STRL REUS W/ TWL LRG LVL3 (GOWN DISPOSABLE) ×1 IMPLANT
GOWN STRL REUS W/ TWL XL LVL3 (GOWN DISPOSABLE) IMPLANT
GOWN STRL REUS W/TWL 2XL LVL3 (GOWN DISPOSABLE) ×3 IMPLANT
GOWN STRL REUS W/TWL LRG LVL3 (GOWN DISPOSABLE) ×3
GOWN STRL REUS W/TWL XL LVL3 (GOWN DISPOSABLE)
GUIDEWIRE BLUNT NT 450 (WIRE) ×8 IMPLANT
HEMOSTAT POWDER KIT SURGIFOAM (HEMOSTASIS) ×3 IMPLANT
KIT BASIN OR (CUSTOM PROCEDURE TRAY) ×3 IMPLANT
KIT POSITION SURG JACKSON T1 (MISCELLANEOUS) ×3 IMPLANT
KIT TURNOVER KIT B (KITS) ×2 IMPLANT
NDL BEVEL TWO-PAK W/1PK (NEEDLE) IMPLANT
NDL HYPO 18GX1.5 BLUNT FILL (NEEDLE) IMPLANT
NDL SPNL 18GX3.5 QUINCKE PK (NEEDLE) IMPLANT
NEEDLE BEVEL TWO-PAK W/1PK (NEEDLE) ×3 IMPLANT
NEEDLE HYPO 18GX1.5 BLUNT FILL (NEEDLE) IMPLANT
NEEDLE HYPO 22GX1.5 SAFETY (NEEDLE) ×3 IMPLANT
NEEDLE SPNL 18GX3.5 QUINCKE PK (NEEDLE) ×3 IMPLANT
NS IRRIG 1000ML POUR BTL (IV SOLUTION) ×3 IMPLANT
PACK LAMINECTOMY NEURO (CUSTOM PROCEDURE TRAY) ×3 IMPLANT
PAD ARMBOARD 7.5X6 YLW CONV (MISCELLANEOUS) ×6 IMPLANT
ROD PERC CCM 5.5X35 (Rod) ×4 IMPLANT
SCREW 6.5X35 VOYAGER MAS FNS (Screw) ×4 IMPLANT
SCREW FENS MAS CCM 6.5X30 (Screw) ×4 IMPLANT
SCREW SET 5.5/6.0MM SOLERA (Screw) ×8 IMPLANT
SPONGE LAP 4X18 RFD (DISPOSABLE) IMPLANT
SUT MNCRL AB 3-0 PS2 18 (SUTURE) ×3 IMPLANT
SUT VIC AB 0 CT1 18XCR BRD8 (SUTURE) IMPLANT
SUT VIC AB 0 CT1 8-18 (SUTURE)
SUT VIC AB 2-0 CP2 18 (SUTURE) ×5 IMPLANT
SYR 3ML LL SCALE MARK (SYRINGE) IMPLANT
TOWEL GREEN STERILE (TOWEL DISPOSABLE) ×3 IMPLANT
TOWEL GREEN STERILE FF (TOWEL DISPOSABLE) ×3 IMPLANT
TRAY FOLEY MTR SLVR 16FR STAT (SET/KITS/TRAYS/PACK) IMPLANT
TRAY FOLEY SLVR 16FR LF STAT (SET/KITS/TRAYS/PACK) ×2 IMPLANT
WATER STERILE IRR 1000ML POUR (IV SOLUTION) ×3 IMPLANT

## 2020-06-14 NOTE — Anesthesia Preprocedure Evaluation (Signed)
Anesthesia Evaluation  Patient identified by MRN, date of birth, ID band Patient awake    Reviewed: Allergy & Precautions, NPO status , Patient's Chart, lab work & pertinent test results  Airway Mallampati: III  TM Distance: >3 FB Neck ROM: Full    Dental  (+) Teeth Intact, Dental Advisory Given,    Pulmonary Current Smoker and Patient abstained from smoking.,    breath sounds clear to auscultation       Cardiovascular hypertension,  Rhythm:Regular Rate:Normal     Neuro/Psych    GI/Hepatic   Endo/Other    Renal/GU      Musculoskeletal   Abdominal (+) + obese,   Peds  Hematology   Anesthesia Other Findings   Reproductive/Obstetrics                             Anesthesia Physical Anesthesia Plan  ASA: III  Anesthesia Plan: General   Post-op Pain Management:    Induction: Intravenous  PONV Risk Score and Plan: Ondansetron and Dexamethasone  Airway Management Planned: Oral ETT  Additional Equipment:   Intra-op Plan:   Post-operative Plan: Extubation in OR  Informed Consent: I have reviewed the patients History and Physical, chart, labs and discussed the procedure including the risks, benefits and alternatives for the proposed anesthesia with the patient or authorized representative who has indicated his/her understanding and acceptance.     Dental advisory given  Plan Discussed with: CRNA and Anesthesiologist  Anesthesia Plan Comments:         Anesthesia Quick Evaluation

## 2020-06-14 NOTE — H&P (Signed)
Surgical H&P Update  HPI: 59 y.o. woman with history of isthmic lumbar spondylolistehsis with low back and leg pain, here for MIS TLIF. Symptoms have unfortunately been refractory to multi-modality treatments. No changes in health since she was last seen. Still having symptoms and wishes to proceed with surgery.  PMHx:  Past Medical History:  Diagnosis Date  . Allergy    pollen  . Arthritis    left knee  . Asthma   . Eczema    Followed by Dr. Nevada Crane dermatology  . Headache(784.0) 09/25/2012  . Hypertension   . Neuromuscular disorder ([place])   . Pre-diabetes   . Sciatica of left side 10/13/2013  . TRIGGER FINGER 11/03/2008   Qualifier: Diagnosis of  By: Aline Brochure MD, Hayden Pedro:  Family History  Problem Relation Age of Onset  . Heart disease Mother        MI  . Asthma Mother   . Hypertension Mother   . Stroke Father   . Heart disease Father        MI  . Hypertension Father   . Cancer Sister        breast  . Colon cancer Neg Hx    SocHx:  reports that she has been smoking cigarettes. She started smoking about 40 years ago. She has a 10.00 pack-year smoking history. She has never used smokeless tobacco. She reports current alcohol use. She reports that she does not use drugs.  Physical Exam: AOx3, PERRL, FS, TM  Strength 5/5 x4, SILTx4  Assesment/Plan: 59 y.o. woman with L5-S1 isthmic spondylolisthesis, here for L5-S1 MIS TLIF. Risks, benefits, and alternatives discussed and the patient would like to continue with surgery.  -OR today -3C post-op  Judith Part, MD 06/14/20 7:44 AM

## 2020-06-14 NOTE — Anesthesia Postprocedure Evaluation (Signed)
Anesthesia Post Note  Patient: Robin Sherman  Procedure(s) Performed: Right Lumbar Five- Sacral One Minimally invasive transforaminal lumbar interbody fusion (Right )     Patient location during evaluation: PACU Anesthesia Type: General Level of consciousness: awake and alert Pain management: pain level controlled Vital Signs Assessment: post-procedure vital signs reviewed and stable Respiratory status: spontaneous breathing, nonlabored ventilation, respiratory function stable and patient connected to nasal cannula oxygen Cardiovascular status: blood pressure returned to baseline and stable Postop Assessment: no apparent nausea or vomiting Anesthetic complications: no   No complications documented.  Last Vitals:  Vitals:   06/14/20 1435 06/14/20 1656  BP: (!) 141/90 124/64  Pulse: 81 97  Resp: 18 18  Temp: 37.2 C 37.1 C  SpO2: 99% 99%    Last Pain:  Vitals:   06/14/20 1656  TempSrc: Oral  PainSc:                  [name]

## 2020-06-14 NOTE — Brief Op Note (Signed)
06/14/2020  12:13 PM  PATIENT:  [name]  59 y.o. female  PRE-OPERATIVE DIAGNOSIS:  Isthmic spondylolisthesis  POST-OPERATIVE DIAGNOSIS:  Isthmic spondylolisthesis  PROCEDURE:  Procedure(s) with comments: Right Lumbar Five- Sacral One Minimally invasive transforaminal lumbar interbody fusion (Right) - Right Lumbar Five- Sacral One Minimally invasive transforaminal lumbar interbody fusion  SURGEON:  Surgeon(s) and Role:    * Brodric Schauer, Joyice Faster, MD - Primary    * Earnie Larsson, MD - Assisting  PHYSICIAN ASSISTANT:   ANESTHESIA:   general  EBL:  30cc  BLOOD ADMINISTERED:none  DRAINS: none   LOCAL MEDICATIONS USED:  LIDOCAINE   SPECIMEN:  No Specimen  DISPOSITION OF SPECIMEN:  N/A  COUNTS:  YES  TOURNIQUET:  * No tourniquets in log *  DICTATION: .Note written in EPIC  PLAN OF CARE: Admit to inpatient   PATIENT DISPOSITION:  PACU - hemodynamically stable.   Delay start of Pharmacological VTE agent (>24hrs) due to surgical blood loss or risk of bleeding: yes

## 2020-06-14 NOTE — Op Note (Signed)
PATIENT: Robin Sherman  DAY OF SURGERY: 06/14/20   PRE-OPERATIVE DIAGNOSIS:  Isthmic sponydlolisthesis, lumar radiculopathy   POST-OPERATIVE DIAGNOSIS:  Same   PROCEDURE:  Right L5-S1 minimally invasive transforaminal lumbar interbody fusion with bilateral L5-S1 pedicle screw placement   SURGEON:  Surgeon(s) and Role:    Judith Part, MD - Primary    Earnie Larsson, MD - Assisting   ANESTHESIA: ETGA   BRIEF HISTORY: This is a 59 year old woman who presented with low back and leg pain. The patient was found to have bilateral pars defects at L5-S1 with some foraminal stenosis. Her pain was unfortunately not responsive to non-surgical treatment measures. I therefore recommended an L5-S1 MIS TLIF. This was discussed with the patient as well as risks, benefits, and alternatives and wished to proceed with surgery.   OPERATIVE DETAIL:  The patient was taken to the operating room and placed on the OR table in the prone position. A formal time out was performed with two patient identifiers and confirmed the operative site. Anesthesia was induced by the anesthesia team. The operative site was marked, hair was clipped with surgical clippers, the area was then prepped and draped in a sterile fashion.   Fluoroscopy was used to localize the surgical level. The pedicles were marked and used to create skin incisions bilaterally. With fluoro guidance, Jamshidi needles were used to guide K-wires into the bilateral L5 and S1 pedicles. The K wires were then secured with hemostats and attention turned to the TLIF.  A [employer] tube was then docked to the right L5-S1 facet through the same incision using fluoroscopy. A right L5-S1 facetectomy was performed and the right L5 nerve root was decompressed along its course. The disc space was identified, incised, and a discectomy was performed in the standard fashion. The endplates were prepped, bone graft was packed into the disc space, and an expandable cage ([employer])  was packed with autograft and placed into the disc space with fluoroscopic confirmation. The tube was removed and hemostasis was obtained during its removal.   Using the previously placed K wires, a tap and then screw with tower were placed bilaterally at L5 and S1. A rod was sized and introduced on both sides, confirmed with fluoroscopy, then final tightened. Hemostasis was again confirmed for both incisions, they were copiously irrigated, and then closed in layers.    EBL:  34mL   DRAINS: none   SPECIMENS: none   Judith Part, MD 06/14/20 7:56 AM

## 2020-06-14 NOTE — Evaluation (Signed)
Physical Therapy Evaluation Patient Details Name: Robin Sherman MRN: [id number] DOB: [birth year] Today's Date: 06/14/2020   History of Present Illness  Patient is a 59 y/o woman with history of isthmic lumbar sponylolisthesis with low back and leg pain. PMH of asthma, arthritis, eczema, HTN, neuromusculr disorder. Patient s/p R L5-S1 lumbar interbody fusion.   Clinical Impression  Prior to surgery, patient states independence with all mobility and lives with husband. Patient min guard for bed mobility for guidance during log roll technique, no physical assist needed. Patient required minA for sit to stand from elevated bed and min guard from [facility] with totalA for pericare. Patient ambulated 49' with no AD and supervision, patient with hands on low back throughout ambulation due to increased pain. Patient will benefit from skilled PT services to address listed deficits (see PT Problem List). Recommend HHPT following discharge to maximize functional mobility and safety.     Follow Up Recommendations Home health PT;Supervision for mobility/OOB    Equipment Recommendations  None recommended by PT    Recommendations for Other Services       Precautions / Restrictions Precautions Precautions: Fall;Back Precaution Booklet Issued: Yes (comment) Required Braces or Orthoses:  (no brace required per MD - patient may request one) Restrictions Weight Bearing Restrictions: No      Mobility  Bed Mobility Overal bed mobility: Needs Assistance Bed Mobility: Supine to Sit;Sit to Supine     Supine to sit: Min guard Sit to supine: Min guard   General bed mobility comments: min guard for guidance during log roll technique, no physical assist required    Transfers Overall transfer level: Needs assistance Equipment used: None Transfers: Sit to/from Stand Sit to Stand: Min assist;From elevated surface         General transfer comment: sit to stand attempt x 2 with 2nd attempt successful. sit  to stand from [facility] min guard, totalA for pericare  Ambulation/Gait Ambulation/Gait assistance: Supervision Gait Distance (Feet): 60 Feet Assistive device: None Gait Pattern/deviations: Step-through pattern;Decreased stride length;Wide base of support Gait velocity: decreased Gait velocity interpretation: <1.31 ft/sec, indicative of household ambulator General Gait Details: very slow steady pace due to increased pain  Stairs            Wheelchair Mobility    Modified Rankin (Stroke Patients Only)       Balance Overall balance assessment: Needs assistance Sitting-balance support: No upper extremity supported;Feet supported Sitting balance-Leahy Scale: Good     Standing balance support: No upper extremity supported;During functional activity Standing balance-Leahy Scale: Fair                               Pertinent Vitals/Pain Pain Assessment: Faces Faces Pain Scale: Hurts whole lot Pain Location: low back Pain Descriptors / Indicators: Grimacing;Guarding Pain Intervention(s): Monitored during session    Home Living Family/patient expects to be discharged to:: Private residence Living Arrangements: Spouse/significant other Available Help at Discharge: Family;Available PRN/intermittently Type of Home: House Home Access: Stairs to enter Entrance Stairs-Rails: Right;Left (cannot reach both) Entrance Stairs-Number of Steps: 3 Home Layout: Two level;Able to live on main level with bedroom/bathroom Home Equipment: Gilford Rile - 2 wheels;Cane - single point;Toilet riser Additional Comments: patient states she will be living on first floor, unable to gather if bedroom is on main level    Prior Function Level of Independence: Independent               Hand Dominance  Extremity/Trunk Assessment   Upper Extremity Assessment Upper Extremity Assessment: Defer to OT evaluation    Lower Extremity Assessment Lower Extremity Assessment: Generalized  weakness       Communication   Communication: No difficulties  Cognition Arousal/Alertness: Awake/alert Behavior During Therapy: WFL for tasks assessed/performed Overall Cognitive Status: Within Functional Limits for tasks assessed                                        General Comments General comments (skin integrity, edema, etc.): husband present throughout and offered encouragement during ambulation    Exercises     Assessment/Plan    PT Assessment Patient needs continued PT services  PT Problem List Decreased strength;Decreased activity tolerance;Decreased balance;Decreased mobility;Pain       PT Treatment Interventions DME instruction;Gait training;Stair training;Therapeutic activities;Functional mobility training;Therapeutic exercise;Balance training;Patient/family education    PT Goals (Current goals can be found in the Care Plan section)  Acute Rehab PT Goals Patient Stated Goal: to go home PT Goal Formulation: With patient/family Time For Goal Achievement: 06/21/20 Potential to Achieve Goals: Good    Frequency Min 5X/week   Barriers to discharge        Co-evaluation               AM-PAC PT "6 Clicks" Mobility  Outcome Measure Help needed turning from your back to your side while in a flat bed without using bedrails?: A Little Help needed moving from lying on your back to sitting on the side of a flat bed without using bedrails?: A Little Help needed moving to and from a bed to a chair (including a wheelchair)?: A Little Help needed standing up from a chair using your arms (e.g., wheelchair or bedside chair)?: A Little Help needed to walk in hospital room?: None Help needed climbing 3-5 steps with a railing? : A Little 6 Click Score: 19    End of Session Equipment Utilized During Treatment: Gait belt Activity Tolerance: Patient limited by pain Patient left: in bed;with call bell/phone within reach;with family/visitor present Nurse  Communication: Mobility status;Patient requests pain meds PT Visit Diagnosis: Unsteadiness on feet (R26.81);Other abnormalities of gait and mobility (R26.89);Muscle weakness (generalized) (M62.81)    Time: 1540-0867 PT Time Calculation (min) (ACUTE ONLY): 29 min   Charges:   PT Evaluation $PT Eval Low Complexity: 1 Low PT Treatments $Therapeutic Activity: 8-22 mins        Perrin Maltese, PT, DPT Acute Rehabilitation Services Pager [phone] Office [phone]   Melene Plan Allred 06/14/2020, 3:42 PM

## 2020-06-14 NOTE — Transfer of Care (Signed)
Immediate Anesthesia Transfer of Care Note  Patient: Robin Sherman  Procedure(s) Performed: Right Lumbar Five- Sacral One Minimally invasive transforaminal lumbar interbody fusion (Right )  Patient Location: PACU  Anesthesia Type:General  Level of Consciousness: drowsy  Airway & Oxygen Therapy: Patient Spontanous Breathing and Patient connected to face mask oxygen  Post-op Assessment: Report given to RN and Post -op Vital signs reviewed and stable  Post vital signs: Reviewed and stable  Last Vitals:  Vitals Value Taken Time  BP 135/78 06/14/20 1225  Temp 36.3 C 06/14/20 1225  Pulse 93 06/14/20 1227  Resp 18 06/14/20 1227  SpO2 99 % 06/14/20 1227  Vitals shown include unvalidated device data.  Last Pain:  Vitals:   06/14/20 0716  PainSc: 0-No pain         Complications: No complications documented.

## 2020-06-14 NOTE — Anesthesia Procedure Notes (Signed)
Procedure Name: Intubation Date/Time: 06/14/2020 8:09 AM Performed by: Reece Agar, CRNA Pre-anesthesia Checklist: Patient identified, Emergency Drugs available, Suction available and Patient being monitored Patient Re-evaluated:Patient Re-evaluated prior to induction Oxygen Delivery Method: Circle System Utilized Preoxygenation: Pre-oxygenation with 100% oxygen Induction Type: IV induction Ventilation: Mask ventilation without difficulty and Oral airway inserted - appropriate to patient size Laryngoscope Size: Mac and 3 Grade View: Grade I Tube type: Oral Tube size: 7.0 mm Number of attempts: 1 Airway Equipment and Method: Stylet and Oral airway Placement Confirmation: ETT inserted through vocal cords under direct vision,  positive ETCO2 and breath sounds checked- equal and bilateral Secured at: 21 cm Tube secured with: Tape Dental Injury: Teeth and Oropharynx as per pre-operative assessment

## 2020-06-15 ENCOUNTER — Encounter (HOSPITAL_COMMUNITY): Payer: Self-pay | Admitting: Neurological Surgery

## 2020-06-15 LAB — GLUCOSE, CAPILLARY: Glucose-Capillary: 122 mg/dL — ABNORMAL HIGH (ref 70–99)

## 2020-06-15 MED ORDER — OXYCODONE-ACETAMINOPHEN 10-325 MG PO TABS
1.0000 | ORAL_TABLET | ORAL | 0 refills | Status: DC | PRN
Start: 2020-06-15 — End: 2020-08-31

## 2020-06-15 MED ORDER — CYCLOBENZAPRINE HCL 10 MG PO TABS
10.0000 mg | ORAL_TABLET | Freq: Three times a day (TID) | ORAL | 0 refills | Status: DC | PRN
Start: 2020-06-15 — End: 2020-09-20

## 2020-06-15 MED ORDER — MELOXICAM 7.5 MG PO TABS: 7.5000 mg | ORAL_TABLET | Freq: Every day | ORAL | 1 refills | Status: DC

## 2020-06-15 NOTE — TOC Initial Note (Addendum)
Transition of Care [facility]) - Initial/Assessment Note    Patient Details  Name: Robin Sherman MRN: [id number] Date of Birth: 08-Sep-1960  Transition of Care [facility]) CM/SW Contact:    Robin Halsted, LCSW Phone Number: 06/15/2020, 9:15 AM  Clinical Narrative:                 CSW received consult for possible home health services at time of discharge. CSW sent referral to [facility] for review. Potential barrier includes patient's Pharmacist, community. CSW confirmed PCP and address.  UPDATE: Robin Sherman has accepted for PT/OT.   Expected Discharge Plan: [place] Barriers to Discharge: No Barriers Identified   Patient Goals and CMS Choice   CMS Medicare.gov Compare Post Acute Care list provided to:: Patient Choice offered to / list presented to : Patient  Expected Discharge Plan and Services Expected Discharge Plan: [place]   Discharge Planning Services: CM Consult Post Acute Care Choice: [place] arrangements for the past 2 months: Single Family Home Expected Discharge Date: 06/15/20                                    Prior Living Arrangements/Services Living arrangements for the past 2 months: Single Family Home Lives with:: Spouse Patient language and need for interpreter reviewed:: Yes Do you feel safe going back to the place where you live?: Yes      Need for Family Participation in Patient Care: Yes (Comment) Care giver support system in place?: Yes (comment)   Criminal Activity/Legal Involvement Pertinent to Current Situation/Hospitalization: No - Comment as needed  Activities of Daily Living      Permission Sought/Granted Permission sought to share information with : Chartered certified accountant granted to share information with : Yes, Verbal Permission Granted     Permission granted to share info w AGENCY: HH        Emotional Assessment Appearance:: Appears stated age   Affect (typically  observed): Appropriate, Accepting Orientation: : Oriented to Self, Oriented to Place, Oriented to  Time, Oriented to Situation Alcohol / Substance Use: Not Applicable Psych Involvement: No (comment)  Admission diagnosis:  Isthmic spondylolisthesis [M43.10] Patient Active Problem List   Diagnosis Date Noted  . Isthmic spondylolisthesis 06/14/2020  . Polypharmacy 02/09/2020  . Encounter for screening colonoscopy 02/09/2020  . DDD (degenerative disc disease), lumbar 12/08/2019  . S/P right knee arthroscopy 12/12/18 *with chondroplasty patella  12/19/2018  . Borderline diabetes 07/02/2015  . Vitamin D deficiency 07/02/2015  . Lumbar back pain 07/02/2015  . Insomnia 10/29/2013  . Arthritis of knee, degenerative 10/13/2013  . Hand dermatitis 03/02/2012  . Essential hypertension, benign 09/21/2011  . Class 2 obesity 09/21/2011  . Shoulder pain, right 09/21/2011  . Asthma, intermittent 09/21/2011  . Tobacco user 09/21/2011   PCP:  Robin Rossetti, MD Pharmacy:   [place] [place] [address] Phone: [phone] Fax: [phone]     Social Determinants of Health (SDOH) Interventions    Readmission Risk Interventions No flowsheet data found.

## 2020-06-15 NOTE — Progress Notes (Addendum)
Physical Therapy Treatment Patient Details Name: Robin Sherman MRN: [id number] DOB: April 30, 1961 Today's Date: 06/15/2020    History of Present Illness Patient is a 59 y/o woman with history of isthmic lumbar sponylolisthesis with low back and leg pain. PMH of asthma, arthritis, eczema, HTN, neuromusculr disorder. Patient s/p R L5-S1 lumbar interbody fusion.     PT Comments    Pt progressing well with post-op mobility. She was able to demonstrate transfers and ambulation with gross supervision for safety with a RW for support. Pt reports improvement in pain with RW, and overall appeared more steady. With RW, I feel pt is mobilizing at a level where home health PT is not warranted at d/c. She already has a RW at home. Pt was educated on precautions, brace wearing schedule (for when she gets the brace), appropriate activity progression, and car transfer. Will continue to follow.      Follow Up Recommendations  No PT follow up;Supervision for mobility/OOB     Equipment Recommendations  None recommended by PT    Recommendations for Other Services       Precautions / Restrictions Precautions Precautions: Fall;Back Precaution Booklet Issued: Yes (comment) Precaution Comments: Reviewed precautions during functional mobility. Per RN, a brace was ordered for the patient but was not delivered yet this morning.  Restrictions Weight Bearing Restrictions: No    Mobility  Bed Mobility Overal bed mobility: Needs Assistance Bed Mobility: Rolling;Sidelying to Sit Rolling: Supervision Sidelying to sit: Supervision       General bed mobility comments: VC's for optimal log roll technique. HOB flat and rails lowered to simulate home environment. Increased time throughout due to pain.   Transfers Overall transfer level: Needs assistance Equipment used: None;Rolling walker (2 wheeled) Transfers: Sit to/from Stand Sit to Stand: Min guard         General transfer comment: VC's for wide BOS  to assist with controlled lower during stand>sit.   Ambulation/Gait Ambulation/Gait assistance: Supervision Gait Distance (Feet): 300 Feet Assistive device: None;Rolling walker (2 wheeled) Gait Pattern/deviations: Step-through pattern;Decreased stride length;Wide base of support Gait velocity: decreased Gait velocity interpretation: <1.31 ft/sec, indicative of household ambulator General Gait Details: Very slow. Initially without AD, however pt holding her back due to pain and drifting R and L in hallway. With RW, pt appeared more comfortable and was able to improve gait speed.    Stairs Stairs: Yes Stairs assistance: Min guard Stair Management: One rail Right;Step to pattern;Forwards Number of Stairs: 4 General stair comments: VC's for sequencing. No assist required however hands on guarding provided for safety.    Wheelchair Mobility    Modified Rankin (Stroke Patients Only)       Balance Overall balance assessment: Needs assistance Sitting-balance support: No upper extremity supported;Feet supported Sitting balance-Leahy Scale: Good     Standing balance support: No upper extremity supported;During functional activity Standing balance-Leahy Scale: Fair                              Cognition Arousal/Alertness: Awake/alert Behavior During Therapy: WFL for tasks assessed/performed Overall Cognitive Status: Within Functional Limits for tasks assessed                                        Exercises      General Comments        Pertinent Vitals/Pain Pain Assessment: Faces  Faces Pain Scale: Hurts even more Pain Location: low back Pain Descriptors / Indicators: Grimacing;Guarding Pain Intervention(s): Limited activity within patient's tolerance;Monitored during session;Repositioned    Home Living                      Prior Function            PT Goals (current goals can now be found in the care plan section) Acute Rehab  PT Goals Patient Stated Goal: to go home PT Goal Formulation: With patient/family Time For Goal Achievement: 06/21/20 Potential to Achieve Goals: Good Progress towards PT goals: Progressing toward goals    Frequency    Min 5X/week      PT Plan Discharge plan needs to be updated    Co-evaluation              AM-PAC PT "6 Clicks" Mobility   Outcome Measure  Help needed turning from your back to your side while in a flat bed without using bedrails?: A Little Help needed moving from lying on your back to sitting on the side of a flat bed without using bedrails?: A Little Help needed moving to and from a bed to a chair (including a wheelchair)?: A Little Help needed standing up from a chair using your arms (e.g., wheelchair or bedside chair)?: A Little Help needed to walk in hospital room?: None Help needed climbing 3-5 steps with a railing? : A Little 6 Click Score: 19    End of Session Equipment Utilized During Treatment: Gait belt Activity Tolerance: Patient limited by pain Patient left: in bed;with call bell/phone within reach;with family/visitor present Nurse Communication: Mobility status;Patient requests pain meds PT Visit Diagnosis: Unsteadiness on feet (R26.81);Other abnormalities of gait and mobility (R26.89);Muscle weakness (generalized) (M62.81)     Time: 9758-8325 PT Time Calculation (min) (ACUTE ONLY): 21 min  Charges:  $Gait Training: 8-22 mins                     Rolinda Roan, PT, DPT Acute Rehabilitation Services Pager: [phone] Office: [phone]    Thelma Comp 06/15/2020, 10:53 AM

## 2020-06-15 NOTE — Progress Notes (Signed)
Orthopedic Tech Progress Note Patient Details:  Robin Sherman July 18, 1960 [id number] Called in order to [facility] for a LSO BRACE Patient ID: Robin Sherman, female   DOB: [birth year], 59 y.o.   MRN: [id number]   [name] 06/15/2020, 8:47 AM

## 2020-06-15 NOTE — Plan of Care (Signed)
Patient alert and oriented, mae's well, voiding adequate amount of urine, swallowing without difficulty, no c/o pain at time of discharge. Patient discharged home with family. Script and discharged instructions given to patient. Patient and family stated understanding of instructions given. Patient has an appointment with Dr. Ostergard   

## 2020-06-15 NOTE — Discharge Summary (Signed)
Discharge Summary  Date of Admission: 06/14/2020  Date of Discharge: 06/15/20  Attending Physician: Emelda Brothers, MD  Hospital Course: Patient was admitted following an uncomplicated M1-D6 MIS TLIF. She was recovered in PACU and transferred to [facility]. Her hospital course was uncomplicated and the patient was discharged home on 06/15/20. She will follow up in clinic with me in 2 weeks.  Neurologic exam at discharge:  AOx3, PERRL, EOMI, FS, TM Strength 5/5 x4, SILTx4  Discharge diagnosis: Lumbar isthmic spondylolisthesis  Judith Part, MD 06/15/20 7:16 AM

## 2020-06-15 NOTE — Evaluation (Signed)
Occupational Therapy Evaluation Patient Details Name: Robin Sherman MRN: [id number] DOB: 1960-09-10 Today's Date: 06/15/2020    History of Present Illness Patient is a 59 y/o woman with history of isthmic lumbar sponylolisthesis with low back and leg pain. PMH of asthma, arthritis, eczema, HTN, neuromusculr disorder. Patient s/p R L5-S1 lumbar interbody fusion.    Clinical Impression   Patient evaluated by Occupational Therapy with no further acute OT needs identified. All education has been completed and the patient has no further questions. See below for any follow-up Occupational Therapy or equipment needs. OT to sign off. Thank you for referral.      Follow Up Recommendations  No OT follow up    Equipment Recommendations  None recommended by OT    Recommendations for Other Services       Precautions / Restrictions Precautions Precautions: Fall;Back Precaution Booklet Issued: Yes (comment) Precaution Comments: reviewed precautions with handout for adls in detail Required Braces or Orthoses: Spinal Brace Spinal Brace: Lumbar corset;Applied in standing position Restrictions Weight Bearing Restrictions: No      Mobility Bed Mobility Overal bed mobility: Needs Assistance Bed Mobility: Rolling;Sidelying to Sit Rolling: Supervision Sidelying to sit: Supervision       General bed mobility comments: oob in chair     Transfers Overall transfer level: Needs assistance Equipment used: Rolling walker (2 wheeled) Transfers: Sit to/from Stand Sit to Stand: Supervision         General transfer comment: wide base of support and reliant on arm support come to standing    Balance Overall balance assessment: Needs assistance Sitting-balance support: No upper extremity supported;Feet supported Sitting balance-Leahy Scale: Good     Standing balance support: No upper extremity supported;During functional activity Standing balance-Leahy Scale: Fair                              ADL either performed or assessed with clinical judgement   ADL Overall ADL's : Needs assistance/impaired Eating/Feeding: Independent   Grooming: Wash/dry hands;Wash/dry face;Supervision/safety;Standing           Upper Body Dressing : Modified independent   Lower Body Dressing: Min guard;Sit to/from stand;Adhering to back precautions;With adaptive equipment Lower Body Dressing Details (indicate cue type and reason): pt able to figure 4 Right leg but requires reacher for L LE. Pt able to dress with reacher use Toilet Transfer: Supervision/safety           Functional mobility during ADLs: Supervision/safety;Rolling walker    Back handout provided and reviewed adls in detail. Pt educated on: clothing between brace, never sleep in brace, set an alarm at night for medication, avoid sitting for long periods of time, correct bed positioning for sleeping, correct sequence for bed mobility, avoiding lifting more than 5 pounds and never wash directly over incision. All education is complete and patient indicates understanding.     Vision Patient Visual Report: No change from baseline       Perception     Praxis      Pertinent Vitals/Pain Pain Assessment: Faces Faces Pain Scale: Hurts a little bit Pain Location: low back Pain Descriptors / Indicators: Grimacing;Guarding Pain Intervention(s): Monitored during session;Premedicated before session;Repositioned     Hand Dominance Right   Extremity/Trunk Assessment Upper Extremity Assessment Upper Extremity Assessment: Overall WFL for tasks assessed   Lower Extremity Assessment Lower Extremity Assessment: Defer to PT evaluation   Cervical / Trunk Assessment Cervical / Trunk Assessment: Other exceptions (s/p  surg)   Communication Communication Communication: No difficulties   Cognition Arousal/Alertness: Awake/alert Behavior During Therapy: WFL for tasks assessed/performed Overall Cognitive Status:  Impaired/Different from baseline Area of Impairment: Memory                     Memory: Decreased short-term memory         General Comments: needed cues to recal precautions. pt needed cues during activity to sustain precautions   General Comments  incision dry and intact. pt educated on washing around incision    Exercises     Shoulder [place] expects to be discharged to:: Private residence Living Arrangements: Spouse/significant other Available Help at Discharge: Family;Available PRN/intermittently Type of Home: House Home Access: Stairs to enter [employer] of Steps: 3 Entrance Stairs-Rails: Right;Left Home Layout: Two level;Able to live on main level with bedroom/bathroom     Bathroom Shower/Tub: Tub/shower unit   Bathroom Toilet: Handicapped height     Home Equipment: Environmental consultant - 2 wheels;Cane - single point;Toilet riser   Additional Comments: pt works at [employer] will have spouse and daughter (A)      Prior Functioning/Environment Level of Independence: Independent                 OT Problem List:        OT Treatment/Interventions:      OT Goals(Current goals can be found in the care plan section) Acute Rehab OT Goals Patient Stated Goal: to go home Potential to Achieve Goals: Good  OT Frequency:     Barriers to D/C:            Co-evaluation              AM-PAC OT "6 Clicks" Daily Activity     Outcome Measure Help from another person eating meals?: None Help from another person taking care of personal grooming?: None Help from another person toileting, which includes using toliet, bedpan, or urinal?: None Help from another person bathing (including washing, rinsing, drying)?: None Help from another person to put on and taking off regular upper body clothing?: None Help from another person to put on and taking off regular lower body clothing?: None 6 Click Score: 24    End of Session Equipment Utilized During Treatment: Back brace;Rolling walker Nurse Communication: Mobility status;Precautions  Activity Tolerance: Patient tolerated treatment well Patient left: in chair;with call bell/phone within reach;with family/visitor present  OT Visit Diagnosis: Unsteadiness on feet (R26.81)                Time: 4259-5638 OT Time Calculation (min): 24 min Charges:  OT General Charges $OT Visit: 1 Visit OT Evaluation $OT Eval Moderate Complexity: 1 Mod   Brynn, OTR/L  Acute Rehabilitation Services Pager: [phone] Office: [phone] .   Jeri Modena 06/15/2020, 11:03 AM

## 2020-06-15 NOTE — Discharge Instructions (Addendum)
Okay to shower on the day of discharge. Use regular soap and water and try to be gentle when cleaning your incision.   Your incision is closed with dermabond (purple glue). This will naturally fall off over the next 1-2 weeks.   Activity Walk each and every day, increasing distance each day. No lifting greater than 5 lbs.  Avoid excessive neck motion. No driving for 2 weeks; may ride as a passenger locally. If provided with back brace, wear when out of bed.  It is not necessary to wear brace in bed.  Diet Resume your normal diet.   Return to Work Will be discussed at you follow up appointment.  Call Your Doctor If Any of These Occur Redness, drainage, or swelling at the wound.  Temperature greater than 101 degrees. Severe pain not relieved by pain medication. Incision starts to come apart.  Follow up with Dr. Zada Finders in 2 weeks after discharge. If you do not already have a discharge appointment, please call his office at [phone] to schedule a follow up appointment. If you have any concerns or questions, please call the office and let us know.

## 2020-06-15 NOTE — Progress Notes (Signed)
Neurosurgery Service Progress Note  Subjective: No acute events overnight, pt says she feels "great", very pleased w/ her result, leg & back improved from preop   Objective: Vitals:   06/14/20 1656 06/14/20 2040 06/14/20 2314 06/15/20 0331  BP: 124/64 101/64 128/82 117/68  Pulse: 97 94 84 79  Resp: 18 18 20 20   Temp: 98.8 F (37.1 C) 99.1 F (37.3 C) 98.9 F (37.2 C) 98.4 F (36.9 C)  TempSrc: Oral Oral Oral Oral  SpO2: 99% 99% 98% 97%  Weight:      Height:        Physical Exam: AOx3, PERRL, EOMI, FS, TM, Strength 5/5 x4, SILTx4  Assessment & Plan: 59 y.o. woman s/p L5-S1 MIS TLIF, recovering well.  -discharge home today  Judith Part  06/15/20 7:12 AM

## 2020-06-16 ENCOUNTER — Telehealth: Payer: Self-pay | Admitting: Orthopedic Surgery

## 2020-06-16 ENCOUNTER — Encounter: Payer: Self-pay | Admitting: Orthopedic Surgery

## 2020-06-16 NOTE — Telephone Encounter (Signed)
Called patient regarding forms received for intermittent leave; discussed forms process; patient understands.

## 2020-08-31 ENCOUNTER — Other Ambulatory Visit: Payer: Self-pay | Admitting: Family Medicine

## 2020-08-31 MED ORDER — OXYCODONE-ACETAMINOPHEN 10-325 MG PO TABS: 1.0000 | ORAL_TABLET | ORAL | 0 refills | Status: DC | PRN

## 2020-08-31 NOTE — Telephone Encounter (Signed)
Pt came in, stating that she needs a refill of  oxyCODONE-acetaminophen (PERCOCET) 10-325 MG tablet sent to [facility].

## 2020-09-13 ENCOUNTER — Encounter: Payer: Self-pay | Admitting: Orthopedic Surgery

## 2020-09-13 ENCOUNTER — Other Ambulatory Visit: Payer: Self-pay

## 2020-09-13 ENCOUNTER — Ambulatory Visit (INDEPENDENT_AMBULATORY_CARE_PROVIDER_SITE_OTHER): Payer: BC Managed Care – PPO | Admitting: Orthopedic Surgery

## 2020-09-13 VITALS — BP 190/126 | HR 97 | Ht 64.0 in | Wt 227.2 lb

## 2020-09-13 DIAGNOSIS — M25561 Pain in right knee: Secondary | ICD-10-CM | POA: Diagnosis not present

## 2020-09-13 DIAGNOSIS — Z9889 Other specified postprocedural states: Secondary | ICD-10-CM

## 2020-09-13 DIAGNOSIS — G8929 Other chronic pain: Secondary | ICD-10-CM | POA: Diagnosis not present

## 2020-09-13 DIAGNOSIS — M5440 Lumbago with sciatica, unspecified side: Secondary | ICD-10-CM

## 2020-09-13 MED ORDER — OXYCODONE-ACETAMINOPHEN 5-325 MG PO TABS: 1.0000 | ORAL_TABLET | Freq: Four times a day (QID) | ORAL | 0 refills | Status: AC | PRN

## 2020-09-13 NOTE — Progress Notes (Signed)
    Chief Complaint  Patient presents with  . Knee Pain    R/it is hurting real bad, causing my whole right side of body to hurt   60 year old female had lumbar surgery in November complains of pain right hip lower back radiating to the right foot.  She also has osteoarthritis right knee chronic pain there as well  Patient says her hydrocodone is not handling her radicular pain   BP (!) 190/126   Pulse 97   Ht 5\' 4"  (1.626 m)   Wt 227 lb 4 oz (103.1 kg)   LMP 04/15/2013 Comment: partial  BMI 39.01 kg/m    She has an effusion in the right knee with some decrease in her range of motion.  She has pain in the lower back as well.  I injected the right knee for her chronic knee pain I gave her some Percocet for her chronic back pain and referred her to pain management she is seeing neurosurgery on the 16th to follow-up on her surgery that she had in November  Encounter Diagnoses  Name Primary?  . Low back pain with sciatica, sciatica laterality unspecified, unspecified back pain laterality, unspecified chronicity Yes  . S/P right knee arthroscopy 12/12/18 *with chondroplasty patella    . Chronic pain of right knee     A steroid injection was performed at right knee using 1% plain Sensorcaine and 6 mg of Celestone. This was well tolerated.  Follow-up as needed  Meds ordered this encounter  Medications  . oxyCODONE-acetaminophen (PERCOCET/ROXICET) 5-325 MG tablet    Sig: Take 1 tablet by mouth every 6 (six) hours as needed for up to 5 days for severe pain.    Dispense:  20 tablet    Refill:  0   Referral to pain management

## 2020-09-13 NOTE — Patient Instructions (Signed)

## 2020-09-20 ENCOUNTER — Other Ambulatory Visit: Payer: Self-pay

## 2020-09-20 ENCOUNTER — Encounter: Payer: Self-pay | Admitting: Family Medicine

## 2020-09-20 ENCOUNTER — Ambulatory Visit: Payer: BC Managed Care – PPO | Admitting: Family Medicine

## 2020-09-20 VITALS — BP 124/78 | HR 86 | Temp 97.5°F | Resp 18 | Ht 64.0 in | Wt 227.6 lb

## 2020-09-20 DIAGNOSIS — E669 Obesity, unspecified: Secondary | ICD-10-CM

## 2020-09-20 DIAGNOSIS — I10 Essential (primary) hypertension: Secondary | ICD-10-CM | POA: Diagnosis not present

## 2020-09-20 DIAGNOSIS — M25561 Pain in right knee: Secondary | ICD-10-CM

## 2020-09-20 DIAGNOSIS — R7303 Prediabetes: Secondary | ICD-10-CM

## 2020-09-20 DIAGNOSIS — M5136 Other intervertebral disc degeneration, lumbar region: Secondary | ICD-10-CM | POA: Diagnosis not present

## 2020-09-20 DIAGNOSIS — J4520 Mild intermittent asthma, uncomplicated: Secondary | ICD-10-CM

## 2020-09-20 MED ORDER — GABAPENTIN 300 MG PO CAPS: 300.0000 mg | ORAL_CAPSULE | Freq: Two times a day (BID) | ORAL | 1 refills | Status: DC

## 2020-09-20 MED ORDER — METFORMIN HCL 500 MG PO TABS: 500.0000 mg | ORAL_TABLET | Freq: Every day | ORAL | 3 refills | Status: DC

## 2020-09-20 MED ORDER — CLOBETASOL PROPIONATE 0.05 % EX CREA: 1.0000 "application " | TOPICAL_CREAM | Freq: Two times a day (BID) | CUTANEOUS | 1 refills | Status: DC

## 2020-09-20 MED ORDER — LISINOPRIL-HYDROCHLOROTHIAZIDE 20-12.5 MG PO TABS: 1.0000 | ORAL_TABLET | Freq: Every day | ORAL | 1 refills | Status: AC

## 2020-09-20 MED ORDER — ALBUTEROL SULFATE HFA 108 (90 BASE) MCG/ACT IN AERS: 2.0000 | INHALATION_SPRAY | Freq: Four times a day (QID) | RESPIRATORY_TRACT | 1 refills | Status: AC | PRN

## 2020-09-20 MED ORDER — CYCLOBENZAPRINE HCL 10 MG PO TABS: 10.0000 mg | ORAL_TABLET | Freq: Three times a day (TID) | ORAL | 0 refills | Status: DC | PRN

## 2020-09-20 MED ORDER — HYDROXYZINE HCL 25 MG PO TABS: 25.0000 mg | ORAL_TABLET | Freq: Three times a day (TID) | ORAL | 1 refills | Status: AC | PRN

## 2020-09-20 NOTE — Assessment & Plan Note (Signed)
Prn albuterol ?

## 2020-09-20 NOTE — Assessment & Plan Note (Signed)
Controlled no changes 

## 2020-09-20 NOTE — Assessment & Plan Note (Addendum)
Referred to pain clinic by ortho Refilled flexeril  D/C lyrica, start gabapentin 300mg  at bedtime, titrate to BID in 1-2 weeks

## 2020-09-20 NOTE — Assessment & Plan Note (Signed)
Recheck A1C, discussed dietary changes, reduce carbs She has started exercise recently Continue metformin

## 2020-09-20 NOTE — Progress Notes (Signed)
   Subjective:    Patient ID: Robin Sherman, female    DOB: 21-Aug-1960, 60 y.o.   MRN: [id number]  Patient presents for Follow-up   Pt here to f/u chronic medical problems  Pre  DM- last A1C  6.1% in NoV , She is working out at [employer] She has not checked her blood sugar   She had back surgery - but still has chronic pain, she was referred to pain clinic by ortho, needs refill on flexeril, also on mobic once a day , she wants to stop lyrica it doesn't help and go back to gabapentin   HTN taking lisinopril HCTZ without SE  Pruritis, needs itching meds refilled     Review Of Systems:  GEN- denies fatigue, fever, weight loss,weakness, recent illness HEENT- denies eye drainage, change in vision, nasal discharge, CVS- denies chest pain, palpitations RESP- denies SOB, cough, wheeze ABD- denies N/V, change in stools, abd pain GU- denies dysuria, hematuria, dribbling, incontinence MSK- + joint pain,  Denies muscle aches, injury Neuro- denies headache, dizziness, syncope, seizure activity       Objective:    BP 124/78   Pulse 86   Temp (!) 97.5 F (36.4 C)   Resp 18   Ht 5\' 4"  (1.626 m)   Wt 227 lb 9.6 oz (103.2 kg)   LMP 04/15/2013 Comment: partial  SpO2 97%   BMI 39.07 kg/m  GEN- NAD, alert and oriented x3 HEENT- PERRL, EOMI, non injected sclera, pink conjunctiva, MMM, oropharynx clear Neck- Supple, no thyromegaly CVS- RRR, no murmur RESP-CTAB ABD-NABS,soft,NT,ND EXT- No edema Pulses- Radial, DP- 2+        Assessment & Plan:      Problem List Items Addressed This Visit      Unprioritized   Asthma, intermittent    Prn albuterol       Relevant Medications   albuterol (VENTOLIN HFA) 108 (90 Base) MCG/ACT inhaler   Borderline diabetes    Recheck A1C, discussed dietary changes, reduce carbs She has started exercise recently Continue metformin       Relevant Orders   Hemoglobin A1c   Class 2 obesity   DDD (degenerative disc disease), lumbar     Referred to pain clinic by ortho Refilled flexeril  D/C lyrica, start gabapentin 300mg  at bedtime, titrate to BID in 1-2 weeks       Relevant Medications   oxyCODONE-acetaminophen (PERCOCET/ROXICET) 5-325 MG tablet   cyclobenzaprine (FLEXERIL) 10 MG tablet   Essential hypertension, benign - Primary    Controlled no changes       Relevant Medications   lisinopril-hydrochlorothiazide (ZESTORETIC) 20-12.5 MG tablet   Other Relevant Orders   CBC with Differential/Platelet   Comprehensive metabolic panel   Lipid panel    Other Visit Diagnoses    Right knee pain, unspecified chronicity       Meloxicam refilled previously prescribed by her orthopedic      Note: This dictation was prepared with Dragon dictation along with smaller phrase technology. Any transcriptional errors that result from this process are unintentional.

## 2020-09-20 NOTE — Patient Instructions (Addendum)
Stop the lyrica Take gabapentin 300mg  at bedtime for 2 weeks, then increase to twice a day  Your blood pressure pill is lisinopril HCTZ

## 2020-09-21 LAB — COMPREHENSIVE METABOLIC PANEL
AG Ratio: 1.4 (calc) (ref 1.0–2.5)
ALT: 29 U/L (ref 6–29)
AST: 25 U/L (ref 10–35)
Albumin: 4.5 g/dL (ref 3.6–5.1)
Alkaline phosphatase (APISO): 134 U/L (ref 37–153)
BUN: 12 mg/dL (ref 7–25)
CO2: 23 mmol/L (ref 20–32)
Calcium: 10.1 mg/dL (ref 8.6–10.4)
Chloride: 103 mmol/L (ref 98–110)
Creat: 0.71 mg/dL (ref 0.50–1.05)
Globulin: 3.2 g/dL (calc) (ref 1.9–3.7)
Glucose, Bld: 111 mg/dL — ABNORMAL HIGH (ref 65–99)
Potassium: 4.4 mmol/L (ref 3.5–5.3)
Sodium: 138 mmol/L (ref 135–146)
Total Bilirubin: 0.2 mg/dL (ref 0.2–1.2)
Total Protein: 7.7 g/dL (ref 6.1–8.1)

## 2020-09-21 LAB — LIPID PANEL
Cholesterol: 188 mg/dL (ref <200)
HDL: 54 mg/dL (ref >=50)
LDL Cholesterol (Calc): 112 mg/dL (calc) — ABNORMAL HIGH
Non-HDL Cholesterol (Calc): 134 mg/dL (calc) — ABNORMAL HIGH (ref <130)
Total CHOL/HDL Ratio: 3.5 (calc) (ref <5.0)
Triglycerides: 112 mg/dL (ref <150)

## 2020-09-21 LAB — CBC WITH DIFFERENTIAL/PLATELET
Absolute Monocytes: 656 cells/uL (ref 200–950)
Basophils Absolute: 67 cells/uL (ref 0–200)
Basophils Relative: 0.7 %
Eosinophils Absolute: 466 cells/uL (ref 15–500)
Eosinophils Relative: 4.9 %
HCT: 42.3 % (ref 35.0–45.0)
Hemoglobin: 13.9 g/dL (ref 11.7–15.5)
Lymphs Abs: 3677 cells/uL (ref 850–3900)
MCH: 28.1 pg (ref 27.0–33.0)
MCHC: 32.9 g/dL (ref 32.0–36.0)
MCV: 85.6 fL (ref 80.0–100.0)
MPV: 10.6 fL (ref 7.5–12.5)
Monocytes Relative: 6.9 %
Neutro Abs: 4636 cells/uL (ref 1500–7800)
Neutrophils Relative %: 48.8 %
Platelets: 415 10*3/uL — ABNORMAL HIGH (ref 140–400)
RBC: 4.94 10*6/uL (ref 3.80–5.10)
RDW: 14.1 % (ref 11.0–15.0)
Total Lymphocyte: 38.7 %
WBC: 9.5 10*3/uL (ref 3.8–10.8)

## 2020-09-21 LAB — HEMOGLOBIN A1C
Hgb A1c MFr Bld: 5.9 % of total Hgb — ABNORMAL HIGH (ref <5.7)
Mean Plasma Glucose: 123 mg/dL
eAG (mmol/L): 6.8 mmol/L

## 2020-09-22 DIAGNOSIS — E119 Type 2 diabetes mellitus without complications: Secondary | ICD-10-CM | POA: Diagnosis not present

## 2020-09-22 DIAGNOSIS — G8929 Other chronic pain: Secondary | ICD-10-CM | POA: Diagnosis not present

## 2020-09-22 DIAGNOSIS — G894 Chronic pain syndrome: Secondary | ICD-10-CM | POA: Diagnosis not present

## 2020-09-22 DIAGNOSIS — M129 Arthropathy, unspecified: Secondary | ICD-10-CM | POA: Diagnosis not present

## 2020-09-22 DIAGNOSIS — F17200 Nicotine dependence, unspecified, uncomplicated: Secondary | ICD-10-CM | POA: Diagnosis not present

## 2020-09-22 DIAGNOSIS — F17210 Nicotine dependence, cigarettes, uncomplicated: Secondary | ICD-10-CM | POA: Diagnosis not present

## 2020-09-22 DIAGNOSIS — M5440 Lumbago with sciatica, unspecified side: Secondary | ICD-10-CM | POA: Diagnosis not present

## 2020-09-22 DIAGNOSIS — E559 Vitamin D deficiency, unspecified: Secondary | ICD-10-CM | POA: Diagnosis not present

## 2020-09-22 DIAGNOSIS — M25561 Pain in right knee: Secondary | ICD-10-CM | POA: Diagnosis not present

## 2020-10-07 DIAGNOSIS — M5416 Radiculopathy, lumbar region: Secondary | ICD-10-CM | POA: Diagnosis not present

## 2020-10-20 DIAGNOSIS — Z79899 Other long term (current) drug therapy: Secondary | ICD-10-CM | POA: Diagnosis not present

## 2020-10-20 DIAGNOSIS — M25561 Pain in right knee: Secondary | ICD-10-CM | POA: Diagnosis not present

## 2020-10-20 DIAGNOSIS — F17210 Nicotine dependence, cigarettes, uncomplicated: Secondary | ICD-10-CM | POA: Diagnosis not present

## 2020-10-20 DIAGNOSIS — F17200 Nicotine dependence, unspecified, uncomplicated: Secondary | ICD-10-CM | POA: Diagnosis not present

## 2020-10-20 DIAGNOSIS — E119 Type 2 diabetes mellitus without complications: Secondary | ICD-10-CM | POA: Diagnosis not present

## 2020-10-20 DIAGNOSIS — M5440 Lumbago with sciatica, unspecified side: Secondary | ICD-10-CM | POA: Diagnosis not present

## 2020-10-20 DIAGNOSIS — G894 Chronic pain syndrome: Secondary | ICD-10-CM | POA: Diagnosis not present

## 2020-10-20 DIAGNOSIS — G8929 Other chronic pain: Secondary | ICD-10-CM | POA: Diagnosis not present

## 2020-10-20 DIAGNOSIS — Z6838 Body mass index (BMI) 38.0-38.9, adult: Secondary | ICD-10-CM | POA: Diagnosis not present

## 2020-10-25 DIAGNOSIS — R2689 Other abnormalities of gait and mobility: Secondary | ICD-10-CM | POA: Diagnosis not present

## 2020-10-25 DIAGNOSIS — M5441 Lumbago with sciatica, right side: Secondary | ICD-10-CM | POA: Diagnosis not present

## 2020-10-25 DIAGNOSIS — R293 Abnormal posture: Secondary | ICD-10-CM | POA: Diagnosis not present

## 2020-10-25 DIAGNOSIS — R531 Weakness: Secondary | ICD-10-CM | POA: Diagnosis not present

## 2020-10-29 DIAGNOSIS — R531 Weakness: Secondary | ICD-10-CM | POA: Diagnosis not present

## 2020-10-29 DIAGNOSIS — R2689 Other abnormalities of gait and mobility: Secondary | ICD-10-CM | POA: Diagnosis not present

## 2020-10-29 DIAGNOSIS — M5441 Lumbago with sciatica, right side: Secondary | ICD-10-CM | POA: Diagnosis not present

## 2020-10-29 DIAGNOSIS — R293 Abnormal posture: Secondary | ICD-10-CM | POA: Diagnosis not present

## 2020-11-03 DIAGNOSIS — R293 Abnormal posture: Secondary | ICD-10-CM | POA: Diagnosis not present

## 2020-11-03 DIAGNOSIS — R531 Weakness: Secondary | ICD-10-CM | POA: Diagnosis not present

## 2020-11-03 DIAGNOSIS — M5441 Lumbago with sciatica, right side: Secondary | ICD-10-CM | POA: Diagnosis not present

## 2020-11-03 DIAGNOSIS — R2689 Other abnormalities of gait and mobility: Secondary | ICD-10-CM | POA: Diagnosis not present

## 2020-11-04 DIAGNOSIS — R2689 Other abnormalities of gait and mobility: Secondary | ICD-10-CM | POA: Diagnosis not present

## 2020-11-04 DIAGNOSIS — R531 Weakness: Secondary | ICD-10-CM | POA: Diagnosis not present

## 2020-11-04 DIAGNOSIS — M5441 Lumbago with sciatica, right side: Secondary | ICD-10-CM | POA: Diagnosis not present

## 2020-11-04 DIAGNOSIS — R293 Abnormal posture: Secondary | ICD-10-CM | POA: Diagnosis not present

## 2020-11-09 DIAGNOSIS — R2689 Other abnormalities of gait and mobility: Secondary | ICD-10-CM | POA: Diagnosis not present

## 2020-11-09 DIAGNOSIS — M5441 Lumbago with sciatica, right side: Secondary | ICD-10-CM | POA: Diagnosis not present

## 2020-11-09 DIAGNOSIS — R531 Weakness: Secondary | ICD-10-CM | POA: Diagnosis not present

## 2020-11-09 DIAGNOSIS — R293 Abnormal posture: Secondary | ICD-10-CM | POA: Diagnosis not present

## 2020-11-12 DIAGNOSIS — R2689 Other abnormalities of gait and mobility: Secondary | ICD-10-CM | POA: Diagnosis not present

## 2020-11-12 DIAGNOSIS — R531 Weakness: Secondary | ICD-10-CM | POA: Diagnosis not present

## 2020-11-12 DIAGNOSIS — R293 Abnormal posture: Secondary | ICD-10-CM | POA: Diagnosis not present

## 2020-11-12 DIAGNOSIS — M5441 Lumbago with sciatica, right side: Secondary | ICD-10-CM | POA: Diagnosis not present

## 2020-11-17 DIAGNOSIS — R531 Weakness: Secondary | ICD-10-CM | POA: Diagnosis not present

## 2020-11-17 DIAGNOSIS — R293 Abnormal posture: Secondary | ICD-10-CM | POA: Diagnosis not present

## 2020-11-17 DIAGNOSIS — R2689 Other abnormalities of gait and mobility: Secondary | ICD-10-CM | POA: Diagnosis not present

## 2020-11-17 DIAGNOSIS — M5441 Lumbago with sciatica, right side: Secondary | ICD-10-CM | POA: Diagnosis not present

## 2020-11-18 DIAGNOSIS — M5441 Lumbago with sciatica, right side: Secondary | ICD-10-CM | POA: Diagnosis not present

## 2020-11-18 DIAGNOSIS — R293 Abnormal posture: Secondary | ICD-10-CM | POA: Diagnosis not present

## 2020-11-18 DIAGNOSIS — R531 Weakness: Secondary | ICD-10-CM | POA: Diagnosis not present

## 2020-11-18 DIAGNOSIS — R2689 Other abnormalities of gait and mobility: Secondary | ICD-10-CM | POA: Diagnosis not present

## 2020-11-22 DIAGNOSIS — R531 Weakness: Secondary | ICD-10-CM | POA: Diagnosis not present

## 2020-11-22 DIAGNOSIS — M5441 Lumbago with sciatica, right side: Secondary | ICD-10-CM | POA: Diagnosis not present

## 2020-11-22 DIAGNOSIS — R293 Abnormal posture: Secondary | ICD-10-CM | POA: Diagnosis not present

## 2020-11-22 DIAGNOSIS — R2689 Other abnormalities of gait and mobility: Secondary | ICD-10-CM | POA: Diagnosis not present

## 2020-11-23 DIAGNOSIS — R2689 Other abnormalities of gait and mobility: Secondary | ICD-10-CM | POA: Diagnosis not present

## 2020-11-23 DIAGNOSIS — M5441 Lumbago with sciatica, right side: Secondary | ICD-10-CM | POA: Diagnosis not present

## 2020-11-23 DIAGNOSIS — R531 Weakness: Secondary | ICD-10-CM | POA: Diagnosis not present

## 2020-11-23 DIAGNOSIS — R293 Abnormal posture: Secondary | ICD-10-CM | POA: Diagnosis not present

## 2020-11-26 DIAGNOSIS — Z79899 Other long term (current) drug therapy: Secondary | ICD-10-CM | POA: Diagnosis not present

## 2020-11-26 DIAGNOSIS — F17200 Nicotine dependence, unspecified, uncomplicated: Secondary | ICD-10-CM | POA: Diagnosis not present

## 2020-11-26 DIAGNOSIS — R030 Elevated blood-pressure reading, without diagnosis of hypertension: Secondary | ICD-10-CM | POA: Diagnosis not present

## 2020-11-26 DIAGNOSIS — M25561 Pain in right knee: Secondary | ICD-10-CM | POA: Diagnosis not present

## 2020-11-26 DIAGNOSIS — Z6838 Body mass index (BMI) 38.0-38.9, adult: Secondary | ICD-10-CM | POA: Diagnosis not present

## 2020-11-26 DIAGNOSIS — G894 Chronic pain syndrome: Secondary | ICD-10-CM | POA: Diagnosis not present

## 2020-11-26 DIAGNOSIS — E119 Type 2 diabetes mellitus without complications: Secondary | ICD-10-CM | POA: Diagnosis not present

## 2020-11-26 DIAGNOSIS — G8929 Other chronic pain: Secondary | ICD-10-CM | POA: Diagnosis not present

## 2020-11-26 DIAGNOSIS — F17210 Nicotine dependence, cigarettes, uncomplicated: Secondary | ICD-10-CM | POA: Diagnosis not present

## 2020-11-26 DIAGNOSIS — M5440 Lumbago with sciatica, unspecified side: Secondary | ICD-10-CM | POA: Diagnosis not present

## 2020-12-02 DIAGNOSIS — M5441 Lumbago with sciatica, right side: Secondary | ICD-10-CM | POA: Diagnosis not present

## 2020-12-02 DIAGNOSIS — R2689 Other abnormalities of gait and mobility: Secondary | ICD-10-CM | POA: Diagnosis not present

## 2020-12-02 DIAGNOSIS — R293 Abnormal posture: Secondary | ICD-10-CM | POA: Diagnosis not present

## 2020-12-02 DIAGNOSIS — R531 Weakness: Secondary | ICD-10-CM | POA: Diagnosis not present

## 2020-12-07 DIAGNOSIS — M5441 Lumbago with sciatica, right side: Secondary | ICD-10-CM | POA: Diagnosis not present

## 2020-12-07 DIAGNOSIS — R2689 Other abnormalities of gait and mobility: Secondary | ICD-10-CM | POA: Diagnosis not present

## 2020-12-07 DIAGNOSIS — R531 Weakness: Secondary | ICD-10-CM | POA: Diagnosis not present

## 2020-12-07 DIAGNOSIS — R293 Abnormal posture: Secondary | ICD-10-CM | POA: Diagnosis not present

## 2020-12-15 DIAGNOSIS — R2689 Other abnormalities of gait and mobility: Secondary | ICD-10-CM | POA: Diagnosis not present

## 2020-12-15 DIAGNOSIS — R293 Abnormal posture: Secondary | ICD-10-CM | POA: Diagnosis not present

## 2020-12-15 DIAGNOSIS — R531 Weakness: Secondary | ICD-10-CM | POA: Diagnosis not present

## 2020-12-15 DIAGNOSIS — M5441 Lumbago with sciatica, right side: Secondary | ICD-10-CM | POA: Diagnosis not present

## 2020-12-24 DIAGNOSIS — Z6838 Body mass index (BMI) 38.0-38.9, adult: Secondary | ICD-10-CM | POA: Diagnosis not present

## 2020-12-24 DIAGNOSIS — G8929 Other chronic pain: Secondary | ICD-10-CM | POA: Diagnosis not present

## 2020-12-24 DIAGNOSIS — R030 Elevated blood-pressure reading, without diagnosis of hypertension: Secondary | ICD-10-CM | POA: Diagnosis not present

## 2020-12-24 DIAGNOSIS — E119 Type 2 diabetes mellitus without complications: Secondary | ICD-10-CM | POA: Diagnosis not present

## 2020-12-24 DIAGNOSIS — M5440 Lumbago with sciatica, unspecified side: Secondary | ICD-10-CM | POA: Diagnosis not present

## 2020-12-24 DIAGNOSIS — G894 Chronic pain syndrome: Secondary | ICD-10-CM | POA: Diagnosis not present

## 2020-12-24 DIAGNOSIS — Z79899 Other long term (current) drug therapy: Secondary | ICD-10-CM | POA: Diagnosis not present

## 2020-12-24 DIAGNOSIS — M25561 Pain in right knee: Secondary | ICD-10-CM | POA: Diagnosis not present

## 2020-12-29 DIAGNOSIS — R350 Frequency of micturition: Secondary | ICD-10-CM | POA: Diagnosis not present

## 2021-01-11 DIAGNOSIS — R42 Dizziness and giddiness: Secondary | ICD-10-CM | POA: Diagnosis not present

## 2021-01-11 DIAGNOSIS — E1165 Type 2 diabetes mellitus with hyperglycemia: Secondary | ICD-10-CM | POA: Diagnosis not present

## 2021-01-21 DIAGNOSIS — E1165 Type 2 diabetes mellitus with hyperglycemia: Secondary | ICD-10-CM | POA: Diagnosis not present

## 2021-01-26 DIAGNOSIS — G894 Chronic pain syndrome: Secondary | ICD-10-CM | POA: Diagnosis not present

## 2021-01-26 DIAGNOSIS — R030 Elevated blood-pressure reading, without diagnosis of hypertension: Secondary | ICD-10-CM | POA: Diagnosis not present

## 2021-01-26 DIAGNOSIS — E119 Type 2 diabetes mellitus without complications: Secondary | ICD-10-CM | POA: Diagnosis not present

## 2021-01-26 DIAGNOSIS — M5440 Lumbago with sciatica, unspecified side: Secondary | ICD-10-CM | POA: Diagnosis not present

## 2021-01-26 DIAGNOSIS — Z79899 Other long term (current) drug therapy: Secondary | ICD-10-CM | POA: Diagnosis not present

## 2021-01-26 DIAGNOSIS — M25561 Pain in right knee: Secondary | ICD-10-CM | POA: Diagnosis not present

## 2021-01-26 DIAGNOSIS — G8929 Other chronic pain: Secondary | ICD-10-CM | POA: Diagnosis not present

## 2021-01-31 ENCOUNTER — Other Ambulatory Visit (HOSPITAL_COMMUNITY): Payer: Self-pay | Admitting: Family Medicine

## 2021-01-31 DIAGNOSIS — E1165 Type 2 diabetes mellitus with hyperglycemia: Secondary | ICD-10-CM | POA: Diagnosis not present

## 2021-01-31 DIAGNOSIS — Z1231 Encounter for screening mammogram for malignant neoplasm of breast: Secondary | ICD-10-CM

## 2021-01-31 DIAGNOSIS — R42 Dizziness and giddiness: Secondary | ICD-10-CM | POA: Diagnosis not present

## 2021-01-31 DIAGNOSIS — I10 Essential (primary) hypertension: Secondary | ICD-10-CM | POA: Diagnosis not present

## 2021-01-31 DIAGNOSIS — E669 Obesity, unspecified: Secondary | ICD-10-CM | POA: Diagnosis not present

## 2021-02-10 ENCOUNTER — Other Ambulatory Visit: Payer: Self-pay

## 2021-02-10 ENCOUNTER — Ambulatory Visit (HOSPITAL_COMMUNITY)
Admission: RE | Admit: 2021-02-10 | Discharge: 2021-02-10 | Disposition: A | Discharge status: Home / Self Care | Payer: BC Managed Care – PPO | Source: Ambulatory Visit | Attending: Family Medicine | Admitting: Family Medicine

## 2021-02-10 DIAGNOSIS — Z1231 Encounter for screening mammogram for malignant neoplasm of breast: Secondary | ICD-10-CM | POA: Insufficient documentation

## 2021-02-14 ENCOUNTER — Other Ambulatory Visit (HOSPITAL_COMMUNITY): Payer: Self-pay | Admitting: Family Medicine

## 2021-02-16 ENCOUNTER — Other Ambulatory Visit (HOSPITAL_COMMUNITY): Payer: Self-pay | Admitting: Family Medicine

## 2021-02-16 DIAGNOSIS — R928 Other abnormal and inconclusive findings on diagnostic imaging of breast: Secondary | ICD-10-CM

## 2021-02-17 ENCOUNTER — Ambulatory Visit: Payer: BC Managed Care – PPO | Admitting: Orthopedic Surgery

## 2021-02-23 ENCOUNTER — Encounter: Payer: Self-pay | Admitting: Orthopedic Surgery

## 2021-02-23 ENCOUNTER — Other Ambulatory Visit: Payer: Self-pay

## 2021-02-23 ENCOUNTER — Ambulatory Visit: Payer: BC Managed Care – PPO

## 2021-02-23 ENCOUNTER — Ambulatory Visit: Payer: BC Managed Care – PPO | Admitting: Orthopedic Surgery

## 2021-02-23 VITALS — BP 139/93 | HR 93 | Ht 64.0 in | Wt 212.0 lb

## 2021-02-23 DIAGNOSIS — M1711 Unilateral primary osteoarthritis, right knee: Secondary | ICD-10-CM

## 2021-02-23 DIAGNOSIS — M1710 Unilateral primary osteoarthritis, unspecified knee: Secondary | ICD-10-CM

## 2021-02-23 DIAGNOSIS — G8929 Other chronic pain: Secondary | ICD-10-CM

## 2021-02-23 MED ORDER — BUPIVACAINE-MELOXICAM ER 200-6 MG/7ML IJ SOLN: 400.0000 mg | Freq: Once | INTRAMUSCULAR | Status: DC

## 2021-02-23 NOTE — Patient Instructions (Addendum)
You have decided to proceed with knee replacement surgery. You have decided not to continue with nonoperative measures such as but not limited to oral medication, weight loss, activity modification, physical therapy, bracing, or injection. Your surgery will be at [facility] by Dr Aline Brochure expect to be in the hospital overnight. September 13th is the date of surgery. The hospital will contact you with a preoperative appointment to discuss Anesthesia. Please bring your medications with you for the appointment. They will tell you the arrival time and medication instructions when you have your preoperative evaluation. Do not wear nail polish the day of your surgery and if you take Phentermine you need to stop this medication ONE WEEK prior to your surgery.   You will also get a call from a representative of [employer], they have a machine that you will use in the first few weeks after surgery. It is called a CPM.   You will have home physical therapy for 2 weeks after surgery, the home health agency will call you before or just following the surgery to set up visits. [facility] is the agency we normally use, unless you request another agency.   You will get a call also from outpatient therapy for therapy starting when the home therapy is done.  If you have questions or need to Reschedule the surgery, call the office ask for Jakayden Cancio.      We will perform the procedure commonly known as total knee replacement. Some of the risks associated with knee replacement surgery include but are not limited to Bleeding Infection Swelling Stiffness Blood clot Pulmonary embolism  Loosening of the implant Pain that persists even after surgery  Infection is especially devastating complication of knee surgery although rare. If infection does occur your implant will usually have to be removed and several surgeries and antibiotics will be needed to eradicate the infection prior to performing a repeat replacement.   In  some cases amputation is required to eradicate the infection. In other rare cases a knee fusion is needed   In compliance with recent [place] law in federal regulation regarding opioid use and abuse and addiction, we will taper (stop) opioid medication after 2 weeks.  If you're not comfortable with these risks and would like to continue with nonoperative treatment please let Dr. Aline Brochure know prior to your surgery.

## 2021-02-23 NOTE — Progress Notes (Signed)
Chief Complaint  Patient presents with   Leg Pain    States pain into right hip and knee    Body mass index is 36.39 kg/m.  This is a 60 year old female status post recent lumbar surgery comes in complaining of medial sided knee pain which radiates into her right hip especially with weightbearing.  She denies any significant back pain at this time most of her pain is on the medial side of the knee travels proximally.  She says it feels like something is inside the knee joint.  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 current medication include meloxicam gabapentin and Percocet  She has had multiple injections she had arthroscopic surgery back in 2020  PROCEDURE:  Procedure(s): KNEE ARTHROSCOPY WITH MEDIAL MENISCECTOMY (Right)-29881 CHONDROPLASTY (Right)   SURGEON:  Surgeon(s) and Role:    Carole Civil, MD - Primary   Findings at surgery: Lateral compartment normal ACL PCL normal Extensive synovitis throughout the joint Mid body medial meniscus tear complex Grade 3 and 4 chondral degeneration medial femoral condyle involving the entire weightbearing surface with a grade 4 lesion of approximately 5 x 5 mm  Past Medical History:  Diagnosis Date   Allergy    pollen   Arthritis    left knee   Asthma    Eczema    Followed by Dr. [place] Crane dermatology   Headache(784.0) 09/25/2012   Hypertension    Neuromuscular disorder ([place])    Pre-diabetes    Sciatica of left side 10/13/2013   TRIGGER FINGER 11/03/2008   Qualifier: Diagnosis of  By: Aline Brochure MD, Dorothyann Peng      Past Surgical History:  Procedure Laterality Date   ABDOMINAL HYSTERECTOMY     bleeding   BILATERAL SALPINGECTOMY Bilateral 08/01/2013   Procedure: BILATERAL SALPINGECTOMY;  Surgeon: Jonnie Kind, MD;  Location: [facility];  Service: Gynecology;  Laterality: Bilateral;   CHONDROPLASTY Left 01/14/2014   Procedure: CHONDROPLASTY OF FEMUR;  Surgeon: Carole Civil, MD;  Location: [facility];  Service: Orthopedics;   Laterality: Left;   CHONDROPLASTY Right 12/12/2018   Procedure: CHONDROPLASTY;  Surgeon: Carole Civil, MD;  Location: [facility];  Service: Orthopedics;  Laterality: Right;   COLONOSCOPY N/A 01/27/2013   Dr. Oneida Alar: Right colon with poor prep (patient ate [place] necks the night before the procedure).  Moderate diverticulosis in the sigmoid colon, moderate hemorrhoids.   COLONOSCOPY WITH PROPOFOL N/A 04/06/2020   Procedure: COLONOSCOPY WITH PROPOFOL;  Surgeon: Eloise Harman, DO;  [facility]: [facility];  Service: Endoscopy;  Laterality: N/A;  8:15am   HEMATOMA EVACUATION N/A 08/03/2013   Procedure: EVACUATION PELVIC HEMATOMA;  Surgeon: Jonnie Kind, MD;  Location: [facility];  Service: Gynecology;  Laterality: N/A;   KNEE ARTHROSCOPY WITH MEDIAL MENISECTOMY Left 01/14/2014   Procedure: KNEE ARTHROSCOPY WITH PARTIAL MEDIAL MENISECTOMY;  Surgeon: Carole Civil, MD;  Location: [facility];  Service: Orthopedics;  Laterality: Left;   KNEE ARTHROSCOPY WITH MEDIAL MENISECTOMY Right 12/12/2018   Procedure: KNEE ARTHROSCOPY WITH MEDIAL MENISCECTOMY;  Surgeon: Carole Civil, MD;  Location: [facility];  Service: Orthopedics;  Laterality: Right;   POLYPECTOMY  04/06/2020   Procedure: POLYPECTOMY;  Surgeon: Eloise Harman, DO;  [facility];  Service: Endoscopy;;   SUPRACERVICAL ABDOMINAL HYSTERECTOMY N/A 08/01/2013   Procedure: HYSTERECTOMY SUPRACERVICAL ABDOMINAL;  Surgeon: Jonnie Kind, MD;  Location: [facility];  Service: Gynecology;  Laterality: N/A;   TRANSFORAMINAL LUMBAR INTERBODY FUSION W/ MIS 1 LEVEL  Right 06/14/2020   Procedure: Right Lumbar Five- Sacral One Minimally invasive transforaminal lumbar interbody fusion;  Surgeon: Judith Part, MD;  Location: [facility];  Service: Neurosurgery;  Laterality: Right;  Right Lumbar Five- Sacral One Minimally invasive transforaminal lumbar interbody fusion   [place]   Social History   Tobacco Use   Smoking  status: Every Day    Packs/day: 0.50    Years: 20.00    Pack years: 10.00    Types: Cigarettes    Start date: 07/18/1979   Smokeless tobacco: Never   Tobacco comments:    wants to quit  Vaping Use   Vaping Use: Never used  Substance Use Topics   Alcohol use: Yes    Alcohol/week: 0.0 standard drinks    Comment: occ   Drug use: No   There are 3 steps to get into her home she does live with someone who can help her at home  Physical Exam Mary's BMI is less than 40 she has normal appearance oriented x3 mood is pleasant affect is normal she waddles and limps on her right leg  Her right knee has 5 to 125 degree range of motion it is in varus it is stable muscle strength and tone is normal skin is intact no distal edema pulses are good lymph nodes are negative sensation is normal coordination balance also normal  The procedure has been fully reviewed with the patient; The risks and benefits of surgery have been discussed and explained and understood. Alternative treatment has also been reviewed, questions were encouraged and answered. The postoperative plan is also been reviewed.  At this point reviewed a knee model the patient will need a right total knee there is no since and waiting when the knee is progressively worsened this bad patient having significant pain and loss of function  Models were shown to the patient about the replacement expecting a 1 night stay in the hospital  Today's x-ray showed extensive varus change in the knee alignment with severe narrowing sclerosis and cyst formation multiple osteophytes around the joint   Encounter Diagnoses  Name Primary?   Primary localized osteoarthritis of knee Yes   Chronic pain of right knee     Right total knee

## 2021-02-24 DIAGNOSIS — Z6837 Body mass index (BMI) 37.0-37.9, adult: Secondary | ICD-10-CM | POA: Diagnosis not present

## 2021-02-24 DIAGNOSIS — G894 Chronic pain syndrome: Secondary | ICD-10-CM | POA: Diagnosis not present

## 2021-02-24 DIAGNOSIS — G8929 Other chronic pain: Secondary | ICD-10-CM | POA: Diagnosis not present

## 2021-02-24 DIAGNOSIS — M5440 Lumbago with sciatica, unspecified side: Secondary | ICD-10-CM | POA: Diagnosis not present

## 2021-02-24 DIAGNOSIS — E119 Type 2 diabetes mellitus without complications: Secondary | ICD-10-CM | POA: Diagnosis not present

## 2021-02-24 DIAGNOSIS — M25561 Pain in right knee: Secondary | ICD-10-CM | POA: Diagnosis not present

## 2021-02-24 DIAGNOSIS — R030 Elevated blood-pressure reading, without diagnosis of hypertension: Secondary | ICD-10-CM | POA: Diagnosis not present

## 2021-02-24 DIAGNOSIS — Z79899 Other long term (current) drug therapy: Secondary | ICD-10-CM | POA: Diagnosis not present

## 2021-03-01 ENCOUNTER — Other Ambulatory Visit: Payer: Self-pay

## 2021-03-01 ENCOUNTER — Ambulatory Visit (HOSPITAL_COMMUNITY)
Admission: RE | Admit: 2021-03-01 | Discharge: 2021-03-01 | Disposition: A | Discharge status: Home / Self Care | Payer: BC Managed Care – PPO | Source: Ambulatory Visit | Attending: Family Medicine | Admitting: Family Medicine

## 2021-03-01 DIAGNOSIS — R928 Other abnormal and inconclusive findings on diagnostic imaging of breast: Secondary | ICD-10-CM | POA: Diagnosis not present

## 2021-03-01 DIAGNOSIS — R922 Inconclusive mammogram: Secondary | ICD-10-CM | POA: Diagnosis not present

## 2021-03-14 ENCOUNTER — Telehealth: Payer: Self-pay | Admitting: Orthopedic Surgery

## 2021-03-14 NOTE — Telephone Encounter (Signed)
Patient completed [employer] forms for [employer] insurance for her dates to be out of work for surgery. Aware of process.

## 2021-03-16 DIAGNOSIS — D485 Neoplasm of uncertain behavior of skin: Secondary | ICD-10-CM | POA: Diagnosis not present

## 2021-03-16 DIAGNOSIS — L308 Other specified dermatitis: Secondary | ICD-10-CM | POA: Diagnosis not present

## 2021-03-16 DIAGNOSIS — D225 Melanocytic nevi of trunk: Secondary | ICD-10-CM | POA: Diagnosis not present

## 2021-03-17 ENCOUNTER — Other Ambulatory Visit: Payer: Self-pay | Admitting: Orthopedic Surgery

## 2021-03-17 DIAGNOSIS — M1710 Unilateral primary osteoarthritis, unspecified knee: Secondary | ICD-10-CM

## 2021-03-23 NOTE — Patient Instructions (Addendum)
Robin Sherman  03/23/2021     '@[email or web address]'$ @   Your procedure is scheduled on  03/29/2021.   Report to [facility] [facility]   Call this number if you have problems the morning of surgery:  [phone]   Remember:  Do not eat or drink after midnight.       Use your inhaler before you come and bring your rescue inhaler with you.    DO NOT take any medications for diabetes the morning of your procedure.     Take these medicines the morning of surgery with A SIP OF WATER            amlodipine, gabapentin, hydroxyzine, mobic (if needed).     Do not wear jewelry, make-up or nail polish.  Do not wear lotions, powders, or perfumes, or deodorant.  Do not shave 48 hours prior to surgery.  Men may shave face and neck.  Do not bring valuables to the hospital.  [facility] is not responsible for any belongings or valuables.  Contacts, dentures or bridgework may not be worn into surgery.  Leave your suitcase in the car.  After surgery it may be brought to your room.  For patients admitted to the hospital, discharge time will be determined by your treatment team.  Patients discharged the day of surgery will not be allowed to drive home and must have some one with them for 24 hours.    Special instructions:   DO NOT smoke tobacco or vape for 24 hours before your procedure.  Please read over the following fact sheets that you were given. Pain Booklet, Coughing and Deep Breathing, Blood Transfusion Information, Total Joint Packet, Surgical Site Infection Prevention, Anesthesia Post-op Instructions, and Care and Recovery After Surgery      Total Knee Replacement, Care After This sheet gives you information about how to care for yourself after your procedure. Your health care provider may also give you more specific instructions. If you have problems or questions, contact your health care provider. What can I expect after the procedure? After the procedure, it  is common to have: Redness, pain, and swelling at the incision area. Stiffness. Discomfort. A small amount of blood or clear fluid coming from your incision. Follow these instructions at home: Medicines Take over-the-counter and prescription medicines only as told by your health care provider. If you were prescribed a blood thinner (anticoagulant), take it as told by your health care provider. Ask your health care provider if the medicine prescribed to you: Requires you to avoid driving or using machinery. Can cause constipation. You may need to take these actions to prevent or treat constipation: Drink enough fluid to keep your urine pale yellow. Take over-the-counter or prescription medicines. Eat foods that are high in fiber, such as beans, whole grains, and fresh fruits and vegetables. Limit foods that are high in fat and processed sugars, such as fried or sweet foods. Incision care  Follow instructions from your health care provider about how to take care of your incision. Make sure you: Wash your hands with soap and water for at least 20 seconds before and after you change your bandage (dressing). If soap and water are not available, use hand sanitizer. Change your dressing as told by your health care provider. Leave stitches (sutures), staples, skin glue, or adhesive strips in place. These skin closures may need to stay in place for 2 weeks or longer. If adhesive  strip edges start to loosen and curl up, you may trim the loose edges. Do not remove adhesive strips completely unless your health care provider tells you to do that. Do not take baths, swim, or use a hot tub until your health care provider approves. Check your incision area every day for signs of infection. Check for: More redness, swelling, or pain. More fluid or blood. Warmth. Pus or a bad smell. Activity Rest as told by your health care provider. Avoid sitting for a long time without moving. Get up to take short  walks every 1-2 hours. This is important to improve blood flow and breathing. Ask for help if you feel weak or unsteady. Follow instructions from your health care provider about using a walker, crutches, or a cane. You may use your legs to support (bear) your body weight as told by your health care provider. Follow instructions about how much weight you may safely support on your affected leg (weight-bearing restrictions). A physical therapist may show you how to get out of a bed and chair and how to go up and down stairs. You will first do this with a walker, crutches, or a cane and then without any of these devices. Once you are able to walk without a limp, you may stop using a walker, crutches, or a cane. Do exercises as told by your health care provider or physical therapist. Avoid high-impact activities, including running, jumping rope, and doing jumping jacks. Do not play contact sports until your health care provider approves. Return to your normal activities as told by your health care provider. Ask your health care provider what activities are safe for you. Managing pain, stiffness, and swelling  If directed, put ice on your knee. To do this: Put ice in a plastic bag or use the icing device (cold flow pad) that you were given. Follow instructions from your health care provider about how to use the icing device. Place a towel between your skin and the bag or between your skin and the icing device. Leave the ice on for 20 minutes, 2-3 times a day. Remove the ice if your skin turns bright red. This is very important. If you cannot feel pain, heat, or cold, you have a greater risk of damage to the area. Move your toes often to reduce stiffness and swelling. Raise (elevate) your leg above the level of your heart while you are sitting or lying down. Use several pillows to keep your leg straight. Do not put a pillow just under the knee. If the knee is bent for a long time, this may lead to  stiffness. Wear elastic knee support as told by your health care provider. Safety  To help prevent falls, keep floors clear of objects you may trip over. Place items that you may need within easy reach. Wear an apron or tool belt with pockets for carrying objects. This leaves your hands free to help with your balance. Ask your health care provider when it is safe to drive. General instructions Wear compression stockings as told by your health care provider. These stockings help to prevent blood clots and reduce swelling in your legs. Continue with breathing exercises. This helps prevent lung infection. Do not use any products that contain nicotine or tobacco. These products include cigarettes, chewing tobacco, and vaping devices, such as e-cigarettes. These can delay healing after surgery. If you need help quitting, ask your health care provider. Tell your health care provider if you plan to have dental  work. Also: Tell your dentist about your joint replacement. Ask your health care provider if there are any special instructions you need to follow before having dental care and routine cleanings. Keep all follow-up visits. This is important. Contact a health care provider if: You have a fever or chills. You have a cough or feel short of breath. Your medicine is not controlling your pain. You have any of these signs of infection: More redness, swelling, or pain around your incision. More fluid or blood coming from your incision. Warmth coming from your incision. Pus or a bad smell coming from your incision. You fall. Get help right away if: You have severe pain. You have trouble breathing. You have chest pain. You have redness, swelling, pain, or warmth in your calf or leg. Your incision breaks open after sutures or staples are removed. These symptoms may represent a serious problem that is an emergency. Do not wait to see if the symptoms will go away. Get medical help right away. Call  your local emergency services (911 in the [place] Do not drive yourself to the hospital. Summary After the procedure, it is common to have pain and swelling at the incision area, a small amount of blood or fluid coming from your incision, and stiffness. Follow instructions from your health care provider about how to take care of your incision. Use crutches, a walker, or a cane as told by your health care provider. This information is not intended to replace advice given to you by your health care provider. Make sure you discuss any questions you have with your health care provider. Document Revised: 12/23/2019 Document Reviewed: 12/23/2019 Elsevier Patient Education  [place]. Spinal Anesthesia and Epidural Anesthesia, Care After This sheet gives you information about how to care for yourself after your procedure. Your doctor may also give you more specific instructions. If you have problems or questions, contact your doctor. What can I expect after the procedure? While the medicines you were given are still having effects, it is common to: Feel like you may vomit (nauseous). Vomit. Have a numb feeling or tingling in your legs. Have problems when you pee (urinate). Feel itchy. Follow these instructions at home: For the time period you were told by your doctor:  Rest. Do not do activities where you could fall or get hurt. Do not drive or use machines. Do not drink alcohol. Do not take sleeping pills or medicines that make you drowsy. Do not make big decisions or sign legal documents. Do not take care of children on your own. Eating and drinking If you vomit, wait a short time. When you can drink without vomiting, try to drink some water, juice, or clear soup. Drink enough fluid to keep your pee (urine) pale yellow. Make sure you do not feel like vomiting before you eat solid foods. Follow the diet that your doctor recommends. General instructions If you have sleep apnea,  surgery and certain medicines can raise your risk for breathing problems. Follow instructions from your doctor about when to wear your sleep device. Have a responsible adult stay with you for the time you are told. It is important to have someone help care for you until you are awake and alert. Return to your normal activities as told by your doctor. Ask your doctor what activities are safe for you. Take over-the-counter and prescription medicines only as told by your doctor. Do not use any products that contain nicotine or tobacco, such as cigarettes, e-cigarettes, and  chewing tobacco. If you need help quitting, ask your doctor. Keep all follow-up visits as told by your doctor. This is important. Contact a doctor if: It has been more than one day since your procedure and you feel like you may vomit or you are vomiting. You have a rash. Get help right away if: You have a fever. You have a headache that lasts a long time. You have a very bad headache. Your vision is blurry or you see two of a single object (double vision). You are dizzy or light-headed. You faint. Your arms or legs tingle, feel weak, or get numb. You have trouble breathing. You cannot pee. These symptoms may be an emergency. Get help right away. Call your local emergency services (911 in the [place] Do not wait to see if the symptoms will go away. Do not drive yourself to the hospital. Summary After the procedure, have a responsible adult stay with you at home. Do not do activities that might cause you to get hurt. Do not drive, use machinery, drink alcohol, or make important decisions as told by your doctor. Do not use products that contain nicotine or tobacco. Get help right away if you have a very bad headache, trouble breathing, or you cannot pee. This information is not intended to replace advice given to you by your health care provider. Make sure you discuss any questions you have with your health care  provider. Document Revised: 03/18/2020 Document Reviewed: 08/21/2019 Elsevier Patient Education  [place] Anesthesia, Adult, Care After This sheet gives you information about how to care for yourself after your procedure. Your health care provider may also give you more specific instructions. If you have problems or questions, contact your health care provider. What can I expect after the procedure? After the procedure, the following side effects are common: Pain or discomfort at the IV site. Nausea. Vomiting. Sore throat. Trouble concentrating. Feeling cold or chills. Feeling weak or tired. Sleepiness and fatigue. Soreness and body aches. These side effects can affect parts of the body that were not involved in surgery. Follow these instructions at home: For the time period you were told by your health care provider:  Rest. Do not participate in activities where you could fall or become injured. Do not drive or use machinery. Do not drink alcohol. Do not take sleeping pills or medicines that cause drowsiness. Do not make important decisions or sign legal documents. Do not take care of children on your own. Eating and drinking Follow any instructions from your health care provider about eating or drinking restrictions. When you feel hungry, start by eating small amounts of foods that are soft and easy to digest (bland), such as toast. Gradually return to your regular diet. Drink enough fluid to keep your urine pale yellow. If you vomit, rehydrate by drinking water, juice, or clear broth. General instructions If you have sleep apnea, surgery and certain medicines can increase your risk for breathing problems. Follow instructions from your health care provider about wearing your sleep device: Anytime you are sleeping, including during daytime naps. While taking prescription pain medicines, sleeping medicines, or medicines that make you drowsy. Have a responsible  adult stay with you for the time you are told. It is important to have someone help care for you until you are awake and alert. Return to your normal activities as told by your health care provider. Ask your health care provider what activities are safe for you. Take over-the-counter and prescription  medicines only as told by your health care provider. If you smoke, do not smoke without supervision. Keep all follow-up visits as told by your health care provider. This is important. Contact a health care provider if: You have nausea or vomiting that does not get better with medicine. You cannot eat or drink without vomiting. You have pain that does not get better with medicine. You are unable to pass urine. You develop a skin rash. You have a fever. You have redness around your IV site that gets worse. Get help right away if: You have difficulty breathing. You have chest pain. You have blood in your urine or stool, or you vomit blood. Summary After the procedure, it is common to have a sore throat or nausea. It is also common to feel tired. Have a responsible adult stay with you for the time you are told. It is important to have someone help care for you until you are awake and alert. When you feel hungry, start by eating small amounts of foods that are soft and easy to digest (bland), such as toast. Gradually return to your regular diet. Drink enough fluid to keep your urine pale yellow. Return to your normal activities as told by your health care provider. Ask your health care provider what activities are safe for you. This information is not intended to replace advice given to you by your health care provider. Make sure you discuss any questions you have with your health care provider. Document Revised: 03/18/2020 Document Reviewed: 10/16/2019 Elsevier Patient Education  2022 [place]. How to Use Chlorhexidine for Bathing Chlorhexidine gluconate (CHG) is a germ-killing (antiseptic)  solution that is used to clean the skin. It can get rid of the bacteria that normally live on the skin and can keep them away for about 24 hours. To clean your skin with CHG, you may be given: A CHG solution to use in the shower or as part of a sponge bath. A prepackaged cloth that contains CHG. Cleaning your skin with CHG may help lower the risk for infection: While you are staying in the intensive care unit of the hospital. If you have a vascular access, such as a central line, to provide short-term or long-term access to your veins. If you have a catheter to drain urine from your bladder. If you are on a ventilator. A ventilator is a machine that helps you breathe by moving air in and out of your lungs. After surgery. What are the risks? Risks of using CHG include: A skin reaction. Hearing loss, if CHG gets in your ears and you have a perforated eardrum. Eye injury, if CHG gets in your eyes and is not rinsed out. The CHG product catching fire. Make sure that you avoid smoking and flames after applying CHG to your skin. Do not use CHG: If you have a chlorhexidine allergy or have previously reacted to chlorhexidine. On babies younger than 35 months of age. How to use CHG solution Use CHG only as told by your health care provider, and follow the instructions on the label. Use the full amount of CHG as directed. Usually, this is one bottle. During a shower Follow these steps when using CHG solution during a shower (unless your health care provider gives you different instructions): Start the shower. Use your normal soap and shampoo to wash your face and hair. Turn off the shower or move out of the shower stream. Pour the CHG onto a clean washcloth. Do not use any  type of brush or rough-edged sponge. Starting at your neck, lather your body down to your toes. Make sure you follow these instructions: If you will be having surgery, pay special attention to the part of your body where you will  be having surgery. Scrub this area for at least 1 minute. Do not use CHG on your head or face. If the solution gets into your ears or eyes, rinse them well with water. Avoid your genital area. Avoid any areas of skin that have broken skin, cuts, or scrapes. Scrub your back and under your arms. Make sure to wash skin folds. Let the lather sit on your skin for 1-2 minutes or as long as told by your health care provider. Thoroughly rinse your entire body in the shower. Make sure that all body creases and crevices are rinsed well. Dry off with a clean towel. Do not put any substances on your body afterward--such as powder, lotion, or perfume--unless you are told to do so by your health care provider. Only use lotions that are recommended by the manufacturer. Put on clean clothes or pajamas. If it is the night before your surgery, sleep in clean sheets.  During a sponge bath Follow these steps when using CHG solution during a sponge bath (unless your health care provider gives you different instructions): Use your normal soap and shampoo to wash your face and hair. Pour the CHG onto a clean washcloth. Starting at your neck, lather your body down to your toes. Make sure you follow these instructions: If you will be having surgery, pay special attention to the part of your body where you will be having surgery. Scrub this area for at least 1 minute. Do not use CHG on your head or face. If the solution gets into your ears or eyes, rinse them well with water. Avoid your genital area. Avoid any areas of skin that have broken skin, cuts, or scrapes. Scrub your back and under your arms. Make sure to wash skin folds. Let the lather sit on your skin for 1-2 minutes or as long as told by your health care provider. Using a different clean, wet washcloth, thoroughly rinse your entire body. Make sure that all body creases and crevices are rinsed well. Dry off with a clean towel. Do not put any substances on  your body afterward--such as powder, lotion, or perfume--unless you are told to do so by your health care provider. Only use lotions that are recommended by the manufacturer. Put on clean clothes or pajamas. If it is the night before your surgery, sleep in clean sheets. How to use CHG prepackaged cloths Only use CHG cloths as told by your health care provider, and follow the instructions on the label. Use the CHG cloth on clean, dry skin. Do not use the CHG cloth on your head or face unless your health care provider tells you to. When washing with the CHG cloth: Avoid your genital area. Avoid any areas of skin that have broken skin, cuts, or scrapes. Before surgery Follow these steps when using a CHG cloth to clean before surgery (unless your health care provider gives you different instructions): Using the CHG cloth, vigorously scrub the part of your body where you will be having surgery. Scrub using a back-and-forth motion for 3 minutes. The area on your body should be completely wet with CHG when you are done scrubbing. Do not rinse. Discard the cloth and let the area air-dry. Do not put any substances on the  area afterward, such as powder, lotion, or perfume. Put on clean clothes or pajamas. If it is the night before your surgery, sleep in clean sheets.  For general bathing Follow these steps when using CHG cloths for general bathing (unless your health care provider gives you different instructions). Use a separate CHG cloth for each area of your body. Make sure you wash between any folds of skin and between your fingers and toes. Wash your body in the following order, switching to a new cloth after each step: The front of your neck, shoulders, and chest. Both of your arms, under your arms, and your hands. Your stomach and groin area, avoiding the genitals. Your right leg and foot. Your left leg and foot. The back of your neck, your back, and your buttocks. Do not rinse. Discard the  cloth and let the area air-dry. Do not put any substances on your body afterward--such as powder, lotion, or perfume--unless you are told to do so by your health care provider. Only use lotions that are recommended by the manufacturer. Put on clean clothes or pajamas. Contact a health care provider if: Your skin gets irritated after scrubbing. You have questions about using your solution or cloth. You swallow any chlorhexidine. Call your local poison control center (1-307 755 6039 in the [place] Get help right away if: Your eyes itch badly, or they become very red or swollen. Your skin itches badly and is red or swollen. Your hearing changes. You have trouble seeing. You have swelling or tingling in your mouth or throat. You have trouble breathing. These symptoms may represent a serious problem that is an emergency. Do not wait to see if the symptoms will go away. Get medical help right away. Call your local emergency services (911 in the [place] Do not drive yourself to the hospital. Summary Chlorhexidine gluconate (CHG) is a germ-killing (antiseptic) solution that is used to clean the skin. Cleaning your skin with CHG may help to lower your risk for infection. You may be given CHG to use for bathing. It may be in a bottle or in a prepackaged cloth to use on your skin. Carefully follow your health care provider's instructions and the instructions on the product label. Do not use CHG if you have a chlorhexidine allergy. Contact your health care provider if your skin gets irritated after scrubbing. This information is not intended to replace advice given to you by your health care provider. Make sure you discuss any questions you have with your health care provider. Document Revised: 09/13/2020 Document Reviewed: 09/13/2020 Elsevier Patient Education  2022 [employer].

## 2021-03-25 ENCOUNTER — Other Ambulatory Visit: Payer: Self-pay

## 2021-03-25 ENCOUNTER — Other Ambulatory Visit (HOSPITAL_COMMUNITY)
Admission: RE | Admit: 2021-03-25 | Discharge: 2021-03-25 | Disposition: A | Discharge status: Home / Self Care | Payer: BC Managed Care – PPO | Source: Ambulatory Visit | Attending: Orthopedic Surgery | Admitting: Orthopedic Surgery

## 2021-03-25 ENCOUNTER — Encounter (HOSPITAL_COMMUNITY)
Admission: RE | Admit: 2021-03-25 | Discharge: 2021-03-25 | Disposition: A | Discharge status: Home / Self Care | Payer: BC Managed Care – PPO | Source: Ambulatory Visit | Attending: Orthopedic Surgery | Admitting: Orthopedic Surgery

## 2021-03-25 DIAGNOSIS — Z20822 Contact with and (suspected) exposure to covid-19: Secondary | ICD-10-CM | POA: Insufficient documentation

## 2021-03-25 DIAGNOSIS — Z01818 Encounter for other preprocedural examination: Secondary | ICD-10-CM | POA: Insufficient documentation

## 2021-03-25 DIAGNOSIS — E119 Type 2 diabetes mellitus without complications: Secondary | ICD-10-CM | POA: Diagnosis not present

## 2021-03-25 LAB — HEMOGLOBIN A1C
Hgb A1c MFr Bld: 6.1 % — ABNORMAL HIGH (ref 4.8–5.6)
Mean Plasma Glucose: 128.37 mg/dL

## 2021-03-25 LAB — BASIC METABOLIC PANEL
Anion gap: 9 (ref 5–15)
BUN: 12 mg/dL (ref 6–20)
CO2: 23 mmol/L (ref 22–32)
Calcium: 9.1 mg/dL (ref 8.9–10.3)
Chloride: 107 mmol/L (ref 98–111)
Creatinine, Ser: 0.6 mg/dL (ref 0.44–1.00)
GFR, Estimated: >60 mL/min (ref >60)
Glucose, Bld: 129 mg/dL — ABNORMAL HIGH (ref 70–99)
Potassium: 3.8 mmol/L (ref 3.5–5.1)
Sodium: 139 mmol/L (ref 135–145)

## 2021-03-25 LAB — CBC WITH DIFFERENTIAL/PLATELET
Abs Immature Granulocytes: 0.02 10*3/uL (ref 0.00–0.07)
Basophils Absolute: 0.1 10*3/uL (ref 0.0–0.1)
Basophils Relative: 1 %
Eosinophils Absolute: 0.6 10*3/uL — ABNORMAL HIGH (ref 0.0–0.5)
Eosinophils Relative: 7 %
HCT: 43.7 % (ref 36.0–46.0)
Hemoglobin: 13.7 g/dL (ref 12.0–15.0)
Immature Granulocytes: 0 %
Lymphocytes Relative: 30 %
Lymphs Abs: 2.5 10*3/uL (ref 0.7–4.0)
MCH: 28.7 pg (ref 26.0–34.0)
MCHC: 31.4 g/dL (ref 30.0–36.0)
MCV: 91.4 fL (ref 80.0–100.0)
Monocytes Absolute: 0.6 10*3/uL (ref 0.1–1.0)
Monocytes Relative: 7 %
Neutro Abs: 4.8 10*3/uL (ref 1.7–7.7)
Neutrophils Relative %: 55 %
Platelets: 409 10*3/uL — ABNORMAL HIGH (ref 150–400)
RBC: 4.78 MIL/uL (ref 3.87–5.11)
RDW: 14.3 % (ref 11.5–15.5)
WBC: 8.5 10*3/uL (ref 4.0–10.5)
nRBC: 0 % (ref 0.0–0.2)

## 2021-03-25 LAB — PREPARE RBC (CROSSMATCH)

## 2021-03-25 LAB — TYPE AND SCREEN
ABO/RH(D): O POS
Antibody Screen: NEGATIVE

## 2021-03-25 LAB — SARS CORONAVIRUS 2 (TAT 6-24 HRS): SARS Coronavirus 2: NEGATIVE

## 2021-03-28 NOTE — H&P (Signed)
Chief Complaint  Patient presents with   Leg Pain      States pain into right hip and knee     Body mass index is 36.39 kg/m.   This is a 60 year old female status post recent lumbar surgery comes in complaining of medial sided knee pain which radiates into her right hip especially with weightbearing.  She denies any significant back pain at this time most of her pain is on the medial side of the knee travels proximally.  She says it feels like something is inside the knee joint.  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 current medication include meloxicam gabapentin and Percocet  She has had multiple injections she had arthroscopic surgery back in 2020   PROCEDURE:  Procedure(s): KNEE ARTHROSCOPY WITH MEDIAL MENISCECTOMY (Right)-29881 CHONDROPLASTY (Right)   SURGEON:  Surgeon(s) and Role:    Carole Civil, MD - Primary   Findings at surgery: Lateral compartment normal ACL PCL normal Extensive synovitis throughout the joint Mid body medial meniscus tear complex Grade 3 and 4 chondral degeneration medial femoral condyle involving the entire weightbearing surface with a grade 4 lesion of approximately 5 x 5 mm       Past Medical History:  Diagnosis Date   Allergy      pollen   Arthritis      left knee   Asthma     Eczema      Followed by Dr. Nevada Crane dermatology   Headache(784.0) 09/25/2012   Hypertension     Neuromuscular disorder ([place])     Pre-diabetes     Sciatica of left side 10/13/2013   TRIGGER FINGER 11/03/2008    Qualifier: Diagnosis of  By: Aline Brochure MD, Dorothyann Peng             Past Surgical History:  Procedure Laterality Date   ABDOMINAL HYSTERECTOMY        bleeding   BILATERAL SALPINGECTOMY Bilateral 08/01/2013    Procedure: BILATERAL SALPINGECTOMY;  Surgeon: Jonnie Kind, MD;  Location: [facility];  Service: Gynecology;  Laterality: Bilateral;   CHONDROPLASTY Left 01/14/2014    Procedure: CHONDROPLASTY OF FEMUR;  Surgeon: Carole Civil, MD;   Location: [facility];  Service: Orthopedics;  Laterality: Left;   CHONDROPLASTY Right 12/12/2018    Procedure: CHONDROPLASTY;  Surgeon: Carole Civil, MD;  Location: [facility];  Service: Orthopedics;  Laterality: Right;   COLONOSCOPY N/A 01/27/2013    Dr. Oneida Alar: Right colon with poor prep (patient ate [place] necks the night before the procedure).  Moderate diverticulosis in the sigmoid colon, moderate hemorrhoids.   COLONOSCOPY WITH PROPOFOL N/A 04/06/2020    Procedure: COLONOSCOPY WITH PROPOFOL;  Surgeon: Eloise Harman, DO;  [facility]: [facility];  Service: Endoscopy;  Laterality: N/A;  8:15am   HEMATOMA EVACUATION N/A 08/03/2013    Procedure: EVACUATION PELVIC HEMATOMA;  Surgeon: Jonnie Kind, MD;  Location: [facility];  Service: Gynecology;  Laterality: N/A;   KNEE ARTHROSCOPY WITH MEDIAL MENISECTOMY Left 01/14/2014    Procedure: KNEE ARTHROSCOPY WITH PARTIAL MEDIAL MENISECTOMY;  Surgeon: Carole Civil, MD;  Location: [facility];  Service: Orthopedics;  Laterality: Left;   KNEE ARTHROSCOPY WITH MEDIAL MENISECTOMY Right 12/12/2018    Procedure: KNEE ARTHROSCOPY WITH MEDIAL MENISCECTOMY;  Surgeon: Carole Civil, MD;  Location: [facility];  Service: Orthopedics;  Laterality: Right;   POLYPECTOMY   04/06/2020    Procedure: POLYPECTOMY;  Surgeon: Eloise Harman, DO;  [facility];  Service: Endoscopy;;   SUPRACERVICAL ABDOMINAL HYSTERECTOMY N/A 08/01/2013    Procedure: HYSTERECTOMY SUPRACERVICAL ABDOMINAL;  Surgeon: Jonnie Kind, MD;  Location: [facility];  Service: Gynecology;  Laterality: N/A;   TRANSFORAMINAL LUMBAR INTERBODY FUSION W/ MIS 1 LEVEL Right 06/14/2020    Procedure: Right Lumbar Five- Sacral One Minimally invasive transforaminal lumbar interbody fusion;  Surgeon: Judith Part, MD;  Location: [facility];  Service: Neurosurgery;  Laterality: Right;  Right Lumbar Five- Sacral One Minimally invasive transforaminal lumbar interbody fusion   [place]    Social History         Tobacco Use   Smoking status: Every Day      Packs/day: 0.50      Years: 20.00      Pack years: 10.00      Types: Cigarettes      Start date: 07/18/1979   Smokeless tobacco: Never   Tobacco comments:      wants to quit  Vaping Use   Vaping Use: Never used  Substance Use Topics   Alcohol use: Yes      Alcohol/week: 0.0 standard drinks      Comment: occ   Drug use: No    There are 3 steps to get into her home she does live with someone who can help her at home   Physical Exam Mary's BMI is less than 40 she has normal appearance oriented x3 mood is pleasant affect is normal she waddles and limps on her right leg  Her right knee has 5 to 125 degree range of motion it is in varus it is stable muscle strength and tone is normal skin is intact no distal edema pulses are good lymph nodes are negative sensation is normal coordination balance also normal  The procedure has been fully reviewed with the patient; The risks and benefits of surgery have been discussed and explained and understood. Alternative treatment has also been reviewed, questions were encouraged and answered. The postoperative plan is also been reviewed.   At this point reviewed a knee model the patient will need a right total knee there is no since and waiting when the knee is progressively worsened this bad patient having significant pain and loss of function  Models were shown to the patient about the replacement expecting a 1 night stay in the hospital  Today's x-ray showed extensive varus change in the knee alignment with severe narrowing sclerosis and cyst formation multiple osteophytes around the joint        Encounter Diagnoses  Name Primary?   Primary localized osteoarthritis of knee Yes   Chronic pain of right knee        Right total knee

## 2021-03-29 ENCOUNTER — Encounter (HOSPITAL_COMMUNITY)
Admission: RE | Disposition: A | Discharge status: Home / Self Care | Payer: Self-pay | Source: Home / Self Care | Attending: Orthopedic Surgery

## 2021-03-29 ENCOUNTER — Observation Stay (HOSPITAL_COMMUNITY)
Admission: RE | Admit: 2021-03-29 | Discharge: 2021-03-31 | Disposition: A | Discharge status: Home / Self Care | Payer: BC Managed Care – PPO | Attending: Orthopedic Surgery | Admitting: Orthopedic Surgery

## 2021-03-29 ENCOUNTER — Ambulatory Visit (HOSPITAL_COMMUNITY): Payer: BC Managed Care – PPO

## 2021-03-29 ENCOUNTER — Ambulatory Visit (HOSPITAL_COMMUNITY): Payer: BC Managed Care – PPO | Admitting: Certified Registered Nurse Anesthetist

## 2021-03-29 ENCOUNTER — Other Ambulatory Visit: Payer: Self-pay

## 2021-03-29 ENCOUNTER — Encounter (HOSPITAL_COMMUNITY): Payer: Self-pay | Admitting: Orthopedic Surgery

## 2021-03-29 DIAGNOSIS — R7303 Prediabetes: Secondary | ICD-10-CM | POA: Insufficient documentation

## 2021-03-29 DIAGNOSIS — F17210 Nicotine dependence, cigarettes, uncomplicated: Secondary | ICD-10-CM | POA: Insufficient documentation

## 2021-03-29 DIAGNOSIS — Z96651 Presence of right artificial knee joint: Secondary | ICD-10-CM | POA: Diagnosis not present

## 2021-03-29 DIAGNOSIS — Z23 Encounter for immunization: Secondary | ICD-10-CM | POA: Insufficient documentation

## 2021-03-29 DIAGNOSIS — I10 Essential (primary) hypertension: Secondary | ICD-10-CM | POA: Insufficient documentation

## 2021-03-29 DIAGNOSIS — M1711 Unilateral primary osteoarthritis, right knee: Principal | ICD-10-CM | POA: Insufficient documentation

## 2021-03-29 DIAGNOSIS — Z471 Aftercare following joint replacement surgery: Secondary | ICD-10-CM | POA: Diagnosis not present

## 2021-03-29 HISTORY — PX: TOTAL KNEE ARTHROPLASTY: SHX125

## 2021-03-29 LAB — GLUCOSE, CAPILLARY
Glucose-Capillary: 110 mg/dL — ABNORMAL HIGH (ref 70–99)
Glucose-Capillary: 87 mg/dL (ref 70–99)
Glucose-Capillary: 154 mg/dL — ABNORMAL HIGH (ref 70–99)

## 2021-03-29 SURGERY — ARTHROPLASTY, KNEE, TOTAL
Anesthesia: Spinal | Site: Knee | Laterality: Right

## 2021-03-29 MED ORDER — ONDANSETRON HCL 4 MG PO TABS
4.0000 mg | ORAL_TABLET | Freq: Four times a day (QID) | ORAL | Status: DC | PRN
  Filled 2021-03-29: qty 1

## 2021-03-29 MED ORDER — FENTANYL CITRATE (PF) 100 MCG/2ML IJ SOLN
INTRAMUSCULAR | Status: AC
  Filled 2021-03-29: qty 2

## 2021-03-29 MED ORDER — PROPOFOL 500 MG/50ML IV EMUL
INTRAVENOUS | Status: DC | PRN
  Administered 2021-03-29: 50 ug/kg/min via INTRAVENOUS

## 2021-03-29 MED ORDER — CLOBETASOL PROPIONATE 0.05 % EX CREA: 1.0000 "application " | TOPICAL_CREAM | Freq: Two times a day (BID) | CUTANEOUS | Status: DC

## 2021-03-29 MED ORDER — METHOCARBAMOL 1000 MG/10ML IJ SOLN
500.0000 mg | Freq: Once | INTRAVENOUS | Status: DC
  Filled 2021-03-29: qty 5

## 2021-03-29 MED ORDER — PHENOL 1.4 % MT LIQD
1.0000 | OROMUCOSAL | Status: DC | PRN
  Filled 2021-03-29: qty 177

## 2021-03-29 MED ORDER — METOCLOPRAMIDE HCL 5 MG/ML IJ SOLN: 5.0000 mg | Freq: Three times a day (TID) | INTRAMUSCULAR | Status: DC | PRN

## 2021-03-29 MED ORDER — CHLORHEXIDINE GLUCONATE 0.12 % MT SOLN: 15.0000 mL | Freq: Once | OROMUCOSAL | Status: DC

## 2021-03-29 MED ORDER — ONDANSETRON HCL 4 MG/2ML IJ SOLN
4.0000 mg | Freq: Once | INTRAMUSCULAR | Status: AC
  Administered 2021-03-29: 4 mg via INTRAVENOUS
  Filled 2021-03-29: qty 2

## 2021-03-29 MED ORDER — PREGABALIN 50 MG PO CAPS
50.0000 mg | ORAL_CAPSULE | Freq: Once | ORAL | Status: DC
  Administered 2021-03-29: 50 mg via ORAL

## 2021-03-29 MED ORDER — PROPOFOL 10 MG/ML IV BOLUS
INTRAVENOUS | Status: AC
  Filled 2021-03-29: qty 60

## 2021-03-29 MED ORDER — LISINOPRIL-HYDROCHLOROTHIAZIDE 20-12.5 MG PO TABS: 1.0000 | ORAL_TABLET | Freq: Every day | ORAL | Status: DC

## 2021-03-29 MED ORDER — PHENYLEPHRINE HCL-NACL 20-0.9 MG/250ML-% IV SOLN
INTRAVENOUS | Status: DC | PRN
  Administered 2021-03-29: 40 ug/min via INTRAVENOUS

## 2021-03-29 MED ORDER — TRANEXAMIC ACID-NACL 1000-0.7 MG/100ML-% IV SOLN
1000.0000 mg | Freq: Once | INTRAVENOUS | Status: AC
  Administered 2021-03-29: 1000 mg via INTRAVENOUS
  Filled 2021-03-29: qty 100

## 2021-03-29 MED ORDER — CEFAZOLIN SODIUM-DEXTROSE 2-4 GM/100ML-% IV SOLN
2.0000 g | Freq: Four times a day (QID) | INTRAVENOUS | Status: AC
  Administered 2021-03-29 (×2): 2 g via INTRAVENOUS
  Filled 2021-03-29 (×2): qty 100

## 2021-03-29 MED ORDER — ASPIRIN EC 325 MG PO TBEC
325.0000 mg | DELAYED_RELEASE_TABLET | Freq: Every day | ORAL | Status: DC
  Administered 2021-03-30 – 2021-03-31 (×2): 325 mg via ORAL
  Filled 2021-03-29 (×2): qty 1

## 2021-03-29 MED ORDER — POLYETHYLENE GLYCOL 3350 17 G PO PACK
17.0000 g | PACK | Freq: Every day | ORAL | Status: DC
  Administered 2021-03-29 – 2021-03-31 (×2): 17 g via ORAL
  Filled 2021-03-29 (×5): qty 1

## 2021-03-29 MED ORDER — CHLORHEXIDINE GLUCONATE CLOTH 2 % EX PADS
6.0000 | MEDICATED_PAD | Freq: Every day | CUTANEOUS | Status: DC
  Administered 2021-03-31: 6 via TOPICAL

## 2021-03-29 MED ORDER — MORPHINE SULFATE (PF) 2 MG/ML IV SOLN
0.5000 mg | INTRAVENOUS | Status: DC | PRN
  Administered 2021-03-29: 0.5 mg via INTRAVENOUS
  Administered 2021-03-29 – 2021-03-30 (×3): 1 mg via INTRAVENOUS
  Filled 2021-03-29 (×4): qty 1

## 2021-03-29 MED ORDER — METFORMIN HCL 500 MG PO TABS
500.0000 mg | ORAL_TABLET | Freq: Every day | ORAL | Status: DC
  Administered 2021-03-30 – 2021-03-31 (×2): 500 mg via ORAL
  Filled 2021-03-29 (×2): qty 1

## 2021-03-29 MED ORDER — ACETAMINOPHEN 325 MG PO TABS: 325.0000 mg | ORAL_TABLET | Freq: Four times a day (QID) | ORAL | Status: DC | PRN

## 2021-03-29 MED ORDER — DIPHENHYDRAMINE HCL 50 MG/ML IJ SOLN
12.5000 mg | Freq: Once | INTRAMUSCULAR | Status: AC
  Administered 2021-03-29: 12.5 mg via INTRAVENOUS
  Filled 2021-03-29: qty 1

## 2021-03-29 MED ORDER — ASCORBIC ACID 500 MG PO TABS
500.0000 mg | ORAL_TABLET | Freq: Every day | ORAL | Status: DC
  Administered 2021-03-30 – 2021-03-31 (×2): 500 mg via ORAL
  Filled 2021-03-29 (×2): qty 1

## 2021-03-29 MED ORDER — DOCUSATE SODIUM 100 MG PO CAPS
100.0000 mg | ORAL_CAPSULE | Freq: Two times a day (BID) | ORAL | Status: DC
  Administered 2021-03-29 – 2021-03-31 (×3): 100 mg via ORAL
  Filled 2021-03-29 (×3): qty 1

## 2021-03-29 MED ORDER — MIDAZOLAM HCL 2 MG/2ML IJ SOLN
INTRAMUSCULAR | Status: AC
  Filled 2021-03-29: qty 2

## 2021-03-29 MED ORDER — FENTANYL CITRATE (PF) 100 MCG/2ML IJ SOLN
INTRAMUSCULAR | Status: DC | PRN
  Administered 2021-03-29: 50 ug via INTRAVENOUS

## 2021-03-29 MED ORDER — LACTATED RINGERS IV SOLN: INTRAVENOUS | Status: DC

## 2021-03-29 MED ORDER — OXYCODONE HCL 5 MG PO TABS
ORAL_TABLET | ORAL | Status: AC
  Filled 2021-03-29: qty 1

## 2021-03-29 MED ORDER — FENTANYL CITRATE (PF) 100 MCG/2ML IJ SOLN
INTRAMUSCULAR | Status: DC | PRN
  Administered 2021-03-29: 15 ug via INTRATHECAL

## 2021-03-29 MED ORDER — ENSURE ENLIVE PO LIQD: 237.0000 mL | Freq: Two times a day (BID) | ORAL | Status: DC

## 2021-03-29 MED ORDER — BUPIVACAINE LIPOSOME 1.3 % IJ SUSP
INTRAMUSCULAR | Status: DC | PRN
  Administered 2021-03-29: 20 mL

## 2021-03-29 MED ORDER — BUPIVACAINE-MELOXICAM ER 400-12 MG/14ML IJ SOLN: 400.0000 mg | Freq: Once | INTRAMUSCULAR | Status: DC

## 2021-03-29 MED ORDER — PROPOFOL 10 MG/ML IV BOLUS
INTRAVENOUS | Status: DC | PRN
Start: 2021-03-29 — End: 2021-03-29
  Administered 2021-03-29: 50 mg via INTRAVENOUS

## 2021-03-29 MED ORDER — HYDROCODONE-ACETAMINOPHEN 5-325 MG PO TABS
1.0000 | ORAL_TABLET | ORAL | Status: DC | PRN
  Administered 2021-03-30: 2 via ORAL
  Filled 2021-03-29: qty 2

## 2021-03-29 MED ORDER — SODIUM CHLORIDE (PF) 0.9 % IJ SOLN
INTRAMUSCULAR | Status: AC
  Filled 2021-03-29: qty 10

## 2021-03-29 MED ORDER — METOCLOPRAMIDE HCL 5 MG PO TABS
5.0000 mg | ORAL_TABLET | Freq: Three times a day (TID) | ORAL | Status: DC | PRN
  Filled 2021-03-29: qty 2

## 2021-03-29 MED ORDER — METHOCARBAMOL 1000 MG/10ML IJ SOLN
500.0000 mg | Freq: Four times a day (QID) | INTRAVENOUS | Status: DC | PRN
  Filled 2021-03-29: qty 5

## 2021-03-29 MED ORDER — SODIUM CHLORIDE (PF) 0.9 % IJ SOLN
INTRAMUSCULAR | Status: DC | PRN
  Administered 2021-03-29: 10 mL

## 2021-03-29 MED ORDER — SODIUM CHLORIDE 0.9 % IR SOLN
Status: DC | PRN
  Administered 2021-03-29: 3000 mL

## 2021-03-29 MED ORDER — HYDROCHLOROTHIAZIDE 12.5 MG PO CAPS
12.5000 mg | ORAL_CAPSULE | Freq: Every day | ORAL | Status: DC
  Administered 2021-03-31: 12.5 mg via ORAL
  Filled 2021-03-29 (×2): qty 1

## 2021-03-29 MED ORDER — TRIAMCINOLONE ACETONIDE 0.5 % EX CREA
TOPICAL_CREAM | Freq: Two times a day (BID) | CUTANEOUS | Status: DC
  Filled 2021-03-29: qty 15

## 2021-03-29 MED ORDER — HYDROXYZINE HCL 25 MG PO TABS
25.0000 mg | ORAL_TABLET | Freq: Every evening | ORAL | Status: DC | PRN
  Administered 2021-03-29: 50 mg via ORAL
  Filled 2021-03-29: qty 2

## 2021-03-29 MED ORDER — ONDANSETRON HCL 4 MG/2ML IJ SOLN
INTRAMUSCULAR | Status: DC | PRN
  Administered 2021-03-29: 4 mg via INTRAVENOUS

## 2021-03-29 MED ORDER — ALBUTEROL SULFATE (2.5 MG/3ML) 0.083% IN NEBU: 3.0000 mL | INHALATION_SOLUTION | Freq: Four times a day (QID) | RESPIRATORY_TRACT | Status: DC | PRN

## 2021-03-29 MED ORDER — ONDANSETRON HCL 4 MG/2ML IJ SOLN: 4.0000 mg | Freq: Four times a day (QID) | INTRAMUSCULAR | Status: DC | PRN

## 2021-03-29 MED ORDER — HYDROCORTISONE 1 % EX CREA: TOPICAL_CREAM | Freq: Two times a day (BID) | CUTANEOUS | Status: DC

## 2021-03-29 MED ORDER — TRANEXAMIC ACID-NACL 1000-0.7 MG/100ML-% IV SOLN
1000.0000 mg | INTRAVENOUS | Status: AC
  Administered 2021-03-29: 1000 mg via INTRAVENOUS
  Filled 2021-03-29 (×2): qty 100

## 2021-03-29 MED ORDER — PHENYLEPHRINE HCL-NACL 20-0.9 MG/250ML-% IV SOLN
INTRAVENOUS | Status: AC
  Filled 2021-03-29: qty 250

## 2021-03-29 MED ORDER — AMLODIPINE BESYLATE 5 MG PO TABS
10.0000 mg | ORAL_TABLET | Freq: Every day | ORAL | Status: DC
  Administered 2021-03-31: 10 mg via ORAL
  Filled 2021-03-29 (×2): qty 2

## 2021-03-29 MED ORDER — PREGABALIN 50 MG PO CAPS
ORAL_CAPSULE | ORAL | Status: AC
  Filled 2021-03-29: qty 1

## 2021-03-29 MED ORDER — CEFAZOLIN SODIUM-DEXTROSE 2-4 GM/100ML-% IV SOLN
2.0000 g | INTRAVENOUS | Status: AC
  Administered 2021-03-29: 2 g via INTRAVENOUS
  Filled 2021-03-29: qty 100

## 2021-03-29 MED ORDER — BUPIVACAINE LIPOSOME 1.3 % IJ SUSP
INTRAMUSCULAR | Status: AC
  Filled 2021-03-29: qty 20

## 2021-03-29 MED ORDER — VITAMIN D 25 MCG (1000 UNIT) PO TABS
2000.0000 [IU] | ORAL_TABLET | Freq: Every day | ORAL | Status: DC
  Administered 2021-03-30 – 2021-03-31 (×2): 2000 [IU] via ORAL
  Filled 2021-03-29 (×2): qty 2

## 2021-03-29 MED ORDER — INSULIN ASPART 100 UNIT/ML IJ SOLN
0.0000 [IU] | Freq: Three times a day (TID) | INTRAMUSCULAR | Status: DC
  Administered 2021-03-30 – 2021-03-31 (×2): 1 [IU] via SUBCUTANEOUS

## 2021-03-29 MED ORDER — TRAMADOL HCL 50 MG PO TABS
50.0000 mg | ORAL_TABLET | Freq: Four times a day (QID) | ORAL | Status: DC
  Administered 2021-03-29 – 2021-03-31 (×7): 50 mg via ORAL
  Filled 2021-03-29 (×7): qty 1

## 2021-03-29 MED ORDER — BISACODYL 10 MG RE SUPP
10.0000 mg | Freq: Every day | RECTAL | Status: DC | PRN
  Filled 2021-03-29: qty 1

## 2021-03-29 MED ORDER — LACTATED RINGERS IV SOLN: INTRAVENOUS | Status: DC | PRN

## 2021-03-29 MED ORDER — LISINOPRIL 10 MG PO TABS
20.0000 mg | ORAL_TABLET | Freq: Every day | ORAL | Status: DC
  Administered 2021-03-31: 20 mg via ORAL
  Filled 2021-03-29 (×2): qty 2

## 2021-03-29 MED ORDER — SODIUM CHLORIDE 0.9 % IV SOLN: INTRAVENOUS | Status: AC

## 2021-03-29 MED ORDER — 0.9 % SODIUM CHLORIDE (POUR BTL) OPTIME
TOPICAL | Status: DC | PRN
  Administered 2021-03-29: 1000 mL

## 2021-03-29 MED ORDER — ALUM & MAG HYDROXIDE-SIMETH 200-200-20 MG/5ML PO SUSP
30.0000 mL | ORAL | Status: DC | PRN
  Filled 2021-03-29: qty 30

## 2021-03-29 MED ORDER — OXYCODONE HCL 5 MG PO TABS
5.0000 mg | ORAL_TABLET | Freq: Once | ORAL | Status: DC
  Administered 2021-03-29: 5 mg via ORAL

## 2021-03-29 MED ORDER — HYDROCODONE-ACETAMINOPHEN 7.5-325 MG PO TABS
1.0000 | ORAL_TABLET | ORAL | Status: DC | PRN
  Administered 2021-03-29 – 2021-03-30 (×2): 2 via ORAL
  Filled 2021-03-29 (×2): qty 2

## 2021-03-29 MED ORDER — POVIDONE-IODINE 10 % EX SWAB: 2.0000 "application " | Freq: Once | CUTANEOUS | Status: DC

## 2021-03-29 MED ORDER — MIDAZOLAM HCL 5 MG/5ML IJ SOLN
INTRAMUSCULAR | Status: DC | PRN
  Administered 2021-03-29: 1 mg via INTRAVENOUS

## 2021-03-29 MED ORDER — DIPHENHYDRAMINE HCL 12.5 MG/5ML PO ELIX
12.5000 mg | ORAL_SOLUTION | ORAL | Status: DC | PRN
  Filled 2021-03-29: qty 10

## 2021-03-29 MED ORDER — BUPIVACAINE IN DEXTROSE 0.75-8.25 % IT SOLN
INTRATHECAL | Status: DC | PRN
Start: 2021-03-29 — End: 2021-03-29
  Administered 2021-03-29: 2 mL via INTRATHECAL

## 2021-03-29 MED ORDER — ORAL CARE MOUTH RINSE: 15.0000 mL | Freq: Once | OROMUCOSAL | Status: DC

## 2021-03-29 MED ORDER — ROPIVACAINE HCL 5 MG/ML IJ SOLN
INTRAMUSCULAR | Status: AC
  Filled 2021-03-29: qty 30

## 2021-03-29 MED ORDER — CHLORHEXIDINE GLUCONATE 0.12 % MT SOLN
OROMUCOSAL | Status: AC
  Filled 2021-03-29: qty 15

## 2021-03-29 MED ORDER — MENTHOL 3 MG MT LOZG
1.0000 | LOZENGE | OROMUCOSAL | Status: DC | PRN
  Filled 2021-03-29: qty 9

## 2021-03-29 MED ORDER — METHOCARBAMOL 500 MG PO TABS: 500.0000 mg | ORAL_TABLET | Freq: Four times a day (QID) | ORAL | Status: DC | PRN

## 2021-03-29 SURGICAL SUPPLY — 67 items
ATTUNE PS FEM RT SZ 4 CEM KNEE (Femur) ×1 IMPLANT
BANDAGE ESMARK 6X9 LF (GAUZE/BANDAGES/DRESSINGS) ×1 IMPLANT
BASEPLATE TIB CMT FB PCKT SZ4 (Stem) ×1 IMPLANT
BIT DRILL 3.2X128 (BIT) ×1 IMPLANT
BLADE SAGITTAL 25.0X1.27X90 (BLADE) ×2 IMPLANT
BLADE SAW SGTL 11.0X1.19X90.0M (BLADE) ×2 IMPLANT
BNDG CMPR 9X6 STRL LF SNTH (GAUZE/BANDAGES/DRESSINGS) ×1
BNDG CMPR STD VLCR NS LF 5.8X4 (GAUZE/BANDAGES/DRESSINGS) ×2
BNDG CMPR STD VLCR NS LF 5.8X6 (GAUZE/BANDAGES/DRESSINGS) ×1
BNDG ELASTIC 4X5.8 VLCR NS LF (GAUZE/BANDAGES/DRESSINGS) ×4 IMPLANT
BNDG ELASTIC 6X5.8 VLCR NS LF (GAUZE/BANDAGES/DRESSINGS) ×2 IMPLANT
BNDG ESMARK 6X9 LF (GAUZE/BANDAGES/DRESSINGS) ×2
BSPLAT TIB 4 CMNT FX BRNG STRL (Stem) ×1 IMPLANT
CEMENT HV SMART SET (Cement) ×4 IMPLANT
CLOTH BEACON ORANGE TIMEOUT ST (SAFETY) ×2 IMPLANT
COOLER ICEMAN CLASSIC (MISCELLANEOUS) ×2 IMPLANT
COVER LIGHT HANDLE STERIS (MISCELLANEOUS) ×4 IMPLANT
CUFF TOURN SGL QUICK 34 (TOURNIQUET CUFF) ×2
CUFF TRNQT CYL 34X4.125X (TOURNIQUET CUFF) ×1 IMPLANT
DRAPE BACK TABLE (DRAPES) ×2 IMPLANT
DRAPE EXTREMITY T 121X128X90 (DISPOSABLE) ×2 IMPLANT
DRESSING AQUACEL AG ADV 3.5X12 (MISCELLANEOUS) ×1 IMPLANT
DRSG AQUACEL AG ADV 3.5X12 (MISCELLANEOUS) ×2
DURAPREP 26ML APPLICATOR (WOUND CARE) ×4 IMPLANT
ELECT REM PT RETURN 9FT ADLT (ELECTROSURGICAL) ×2
ELECTRODE REM PT RTRN 9FT ADLT (ELECTROSURGICAL) ×1 IMPLANT
GLOVE SS N UNI LF 8.5 STRL (GLOVE) ×2 IMPLANT
GLOVE SURG POLYISO LF SZ8 (GLOVE) ×4 IMPLANT
GLOVE SURG UNDER POLY LF SZ7 (GLOVE) ×10 IMPLANT
GOWN STRL REUS W/TWL LRG LVL3 (GOWN DISPOSABLE) ×6 IMPLANT
GOWN STRL REUS W/TWL XL LVL3 (GOWN DISPOSABLE) ×2 IMPLANT
HANDPIECE INTERPULSE COAX TIP (DISPOSABLE) ×2
HOOD W/PEELAWAY (MISCELLANEOUS) ×8 IMPLANT
INSERT TIB ATTUNE FB SZ4X6 (Insert) ×1 IMPLANT
INST SET MAJOR BONE (KITS) ×2 IMPLANT
IV NS IRRIG 3000ML ARTHROMATIC (IV SOLUTION) ×2 IMPLANT
KIT BLADEGUARD II DBL (SET/KITS/TRAYS/PACK) ×2 IMPLANT
KIT TURNOVER KIT A (KITS) ×2 IMPLANT
MANIFOLD NEPTUNE II (INSTRUMENTS) ×2 IMPLANT
MARKER SKIN DUAL TIP RULER LAB (MISCELLANEOUS) ×2 IMPLANT
NDL HYPO 18GX1.5 BLUNT FILL (NEEDLE) IMPLANT
NDL HYPO 21X1.5 SAFETY (NEEDLE) IMPLANT
NEEDLE HYPO 18GX1.5 BLUNT FILL (NEEDLE) ×2 IMPLANT
NEEDLE HYPO 21X1.5 SAFETY (NEEDLE) ×2 IMPLANT
NS IRRIG 1000ML POUR BTL (IV SOLUTION) ×2 IMPLANT
PACK TOTAL JOINT (CUSTOM PROCEDURE TRAY) ×2 IMPLANT
PAD ARMBOARD 7.5X6 YLW CONV (MISCELLANEOUS) ×2 IMPLANT
PAD COLD SHLDR WRAP-ON (PAD) ×2 IMPLANT
PATELLA MEDIAL ATTUN 35MM KNEE (Knees) ×1 IMPLANT
PENCIL SMOKE EVACUATOR (MISCELLANEOUS) ×2 IMPLANT
PILLOW KNEE EXTENSION 0 DEG (MISCELLANEOUS) ×2 IMPLANT
SAW OSC TIP CART 19.5X105X1.3 (SAW) ×2 IMPLANT
SET BASIN LINEN APH (SET/KITS/TRAYS/PACK) ×2 IMPLANT
SET HNDPC FAN SPRY TIP SCT (DISPOSABLE) ×1 IMPLANT
SPONGE T-LAP 18X18 ~~LOC~~+RFID (SPONGE) ×3 IMPLANT
STAPLER VISISTAT 35W (STAPLE) ×2 IMPLANT
SUT BRALON NAB BRD #1 30IN (SUTURE) ×3 IMPLANT
SUT MNCRL 0 VIOLET CTX 36 (SUTURE) ×1 IMPLANT
SUT MON AB 0 CT1 (SUTURE) ×2 IMPLANT
SUT MONOCRYL 0 CTX 36 (SUTURE) ×2
SYR 20ML LL LF (SYRINGE) ×3 IMPLANT
SYR BULB IRRIG 60ML STRL (SYRINGE) ×2 IMPLANT
TOWEL OR 17X26 4PK STRL BLUE (TOWEL DISPOSABLE) ×2 IMPLANT
TOWER CARTRIDGE SMART MIX (DISPOSABLE) ×2 IMPLANT
TRAY FOLEY MTR SLVR 16FR STAT (SET/KITS/TRAYS/PACK) ×2 IMPLANT
WATER STERILE IRR 1000ML POUR (IV SOLUTION) ×4 IMPLANT
YANKAUER SUCT 12FT TUBE ARGYLE (SUCTIONS) ×2 IMPLANT

## 2021-03-29 NOTE — Progress Notes (Signed)
Pt dangles on side of bed this evening. Pt tolerated it well for roughly 3 minutes. Pt now resting in bed. Bone foam back on and iceman back on as well.

## 2021-03-29 NOTE — Anesthesia Preprocedure Evaluation (Signed)
Anesthesia Evaluation  Patient identified by MRN, date of birth, ID band Patient awake    Reviewed: Allergy & Precautions, H&P , NPO status , Patient's Chart, lab work & pertinent test results, reviewed documented beta blocker date and time   Airway Mallampati: II  TM Distance: >3 FB Neck ROM: full    Dental no notable dental hx.    Pulmonary neg pulmonary ROS, Current Smoker and Patient abstained from smoking.,    Pulmonary exam normal breath sounds clear to auscultation       Cardiovascular Exercise Tolerance: Good hypertension, negative cardio ROS   Rhythm:regular Rate:Normal     Neuro/Psych  Headaches,  Neuromuscular disease negative psych ROS   GI/Hepatic negative GI ROS, Neg liver ROS,   Endo/Other  negative endocrine ROS  Renal/GU negative Renal ROS  negative genitourinary   Musculoskeletal   Abdominal   Peds  Hematology negative hematology ROS (+)   Anesthesia Other Findings   Reproductive/Obstetrics negative OB ROS                             Anesthesia Physical Anesthesia Plan  ASA: 3  Anesthesia Plan: Spinal   Post-op Pain Management:  Regional for Post-op pain   Induction:   PONV Risk Score and Plan:   Airway Management Planned:   Additional Equipment:   Intra-op Plan:   Post-operative Plan:   Informed Consent: I have reviewed the patients History and Physical, chart, labs and discussed the procedure including the risks, benefits and alternatives for the proposed anesthesia with the patient or authorized representative who has indicated his/her understanding and acceptance.     Dental Advisory Given  Plan Discussed with: CRNA  Anesthesia Plan Comments:         Anesthesia Quick Evaluation

## 2021-03-29 NOTE — Brief Op Note (Signed)
03/29/2021  12:42 PM  PATIENT:  Robin Sherman  60 y.o. female  PRE-OPERATIVE DIAGNOSIS:  right knee osteoarthritis  POST-OPERATIVE DIAGNOSIS:  right knee osteoarthritis  PROCEDURE:  Procedure(s): TOTAL KNEE ARTHROPLASTY (Right)  SURGEON:  Surgeon(s) and Role:    Carole Civil, MD - Primary  PHYSICIAN ASSISTANT:   ASSISTANTS: Vevelyn Royals  ANESTHESIA:   spinal plus postop PACU saphenous nerve block  EBL:  25 mL   BLOOD ADMINISTERED:none  DRAINS: none   LOCAL MEDICATIONS USED:  OTHER 20 cc of Exparel diluted to 40 cc  SPECIMEN:  No Specimen  DISPOSITION OF SPECIMEN:  N/A  COUNTS:  YES  TOURNIQUET:   Total Tourniquet Time Documented: Thigh (Right) - 83 minutes Total: Thigh (Right) - 83 minutes   DICTATION: .Viviann Spare Dictation  PLAN OF CARE: Admit for overnight observation  PATIENT DISPOSITION:  PACU - hemodynamically stable.   Delay start of Pharmacological VTE agent (>24hrs) due to surgical blood loss or risk of bleeding: yes

## 2021-03-29 NOTE — Interval H&P Note (Signed)
History and Physical Interval Note:  03/29/2021 10:23 AM  Robin Sherman  has presented today for surgery, with the diagnosis of right knee osteoarthritis.  The various methods of treatment have been discussed with the patient and family. After consideration of risks, benefits and other options for treatment, the patient has consented to  Procedure(s): TOTAL KNEE ARTHROPLASTY (Right) as a surgical intervention.  The patient's history has been reviewed, patient examined, no change in status, stable for surgery.  I have reviewed the patient's chart and labs.  Questions were answered to the patient's satisfaction.     Arther Abbott

## 2021-03-29 NOTE — Transfer of Care (Signed)
Immediate Anesthesia Transfer of Care Note  Patient: Robin Sherman  Procedure(s) Performed: TOTAL KNEE ARTHROPLASTY (Right: Knee)  Patient Location: PACU  Anesthesia Type:MAC and Spinal  Level of Consciousness: awake, alert  and oriented  Airway & Oxygen Therapy: Patient Spontanous Breathing and Patient connected to face mask oxygen  Post-op Assessment: Report given to RN, Post -op Vital signs reviewed and stable and T-10 sensory level  Post vital signs: Reviewed and stable  Last Vitals:  Vitals Value Taken Time  BP    Temp    Pulse    Resp    SpO2      Last Pain:  Vitals:   03/29/21 0815  TempSrc: Oral  PainSc:       Patients Stated Pain Goal: 8 (25/42/70 6237)  Complications: No notable events documented.

## 2021-03-29 NOTE — Progress Notes (Signed)
Pt instructed how to use incentive spirometer. 1750 mL achieved. Tolerated well.

## 2021-03-29 NOTE — Op Note (Signed)
Dictation for total knee replacement  03/29/2021  12:43 PM  Preop diagnosis osteoarthritis right KNEE  Postop diagnosis osteoarthritis right KNEE  Findings global arthritis all 3 compartments mostly grade 4 changes multiple osteophytes  Procedure right total knee arthroplasty  Implants   DePuy   Attune, fixed-bearing posterior stabilized total knee: Sizes: Femur   4   tibia 4   patella 38   POLYthylene 6  Bone cuts:  Distal femur 11  PROXIMAL TIBIA 5 reference medial tibia  PATELLA 24 cut down to 14         ASSISTANTS: Vevelyn Royals  ANESTHESIA:   Spinal plus postop saphenous nerve block   BLOOD ADMINISTERED:none  DRAINS: none   LOCAL MEDICATIONS USED:  EXPAREL  SPECIMEN:  No Specimen  DISPOSITION OF SPECIMEN:  N/A  COUNTS:  YES  TOURNIQUET: 83 minutes 280 mmHg  DICTATION: .Dragon Dictation   The patient was taken to the recovery room in stable condition  PLAN OF CARE: Normal postop observation status  PATIENT DISPOSITION:  PACU - hemodynamically stable.   Delay start of Pharmacological VTE agent (>24hrs) due to surgical blood loss or risk of bleeding: not applicable  Details of surgery: The patient was identified by 2 approved identification mechanisms. The operative extremity was evaluated and found to be acceptable for surgical treatment today. The chart was reviewed. The surgical site was confirmed and marked. The patient had saphenous nerve block in the PACU  The patient was taken to the operating room and given appropriate antibiotic Ancef 2 g. This is consistent with the SCIP protocol.  The patient was given the following anesthetic: Spinal anesthetic then postop saphenous nerve block in PACU  The patient was then placed supine on the operating table. A Foley catheter was inserted. The operative extremity was prepped and draped sterilely from the toes to the groin.  Timeout was executed confirming the patient's name, surgical site, antibiotic  administration, x-rays available, and implants available.  The operative limb,  was exsanguinated with a six-inch Esmarch and the tourniquet was inflated to 280 mmHg.  A straight midline incision was made over the right KNEE and taken down to the extensor mechanism. A medial arthrotomy was performed. The patella was everted and the patellofemoral soft tissue was released, along with the patellar fat pad.  The anterior cruciate ligament and PCL were resected.  The anterior horns of the lateral and medial meniscus were resected. The medial soft tissue sleeve was elevated to the mid coronal plane.  A three-eighths inch drill bit was used to enter the femoral canal which was decompressed with suction and irrigation until clear.   The distal femoral cutting guide was set for 11 mm distal resection,  5valgus alignment, for a right knee. The distal femur was resected and checked for flatness.  The Depuy Sigma sizing femoral guide was placed and the femur was sized to a size 4.   The external alignment guide for the tibial resection was then applied to the distal and proximal tibia and set for anatomic slope along with 5 MM resection  from the medial tibia.   Rotational alignment was set using the malleolus, the tibial tubercle and the tibial spines.  The proximal tibia was resected along with  residual menisci. The tibia was sized using a base plate to a size 4.   The extension gap was checked.  6 mm spacer block balance the extension gap   A 4-in-1 cutting block was placed along with collateral ligament retractors  and the distal femoral cuts were completed.  Spacer blocks were used to confirm equal flexion extension gaps with releases done as needed. A size 6 MM spacer block gave equal stability and flexion extension.  The correct sized notch cutting guide for the femur was then applied and the notch cut was made.  Trial reduction was completed using size 4 femur 4 tibia 6 poly-trial  implants. Patella tracking was normal  We then skeletonized the patella. It measured 24 in thickness and the patellar resection was set for 14 millimeters. the patellar resection was completed. The patella diameter measured 38. We then drilled the peg holes for the patella.  Completed patellar thickness was 23  The proximal tibia was prepared using the size 4 base plate.  Drill holes were placed in sclerotic bone medial tibia and medial femur  Thorough irrigation was performed and the bone was dried and prepared for cement. The cement was mixed on the back table using third generation preparation techniques   20 cc of dilute Exparel was injected into the soft tissues including the posterior capsule.  was also injected    The implants were then cemented in place and excess cement was removed. The cement was allowed to cure. Irrigation was repeated and excess and residual bone fragments and cement were removed.  The extensor mechanism was closed with #1 Bralon suture followed by subcutaneous tissue closure using 0 Monocryl suture interrupted and running 2 separate layers   Skin approximation was performed using staples  A sterile dressing was applied, along with the TED hose  The patient was taken recovery room in stable condition

## 2021-03-29 NOTE — Anesthesia Postprocedure Evaluation (Signed)
Anesthesia Post Note  Patient: Robin Sherman  Procedure(s) Performed: TOTAL KNEE ARTHROPLASTY (Right: Knee)  Patient location during evaluation: Phase II Anesthesia Type: Spinal Level of consciousness: awake Pain management: pain level controlled Vital Signs Assessment: post-procedure vital signs reviewed and stable Respiratory status: spontaneous breathing and respiratory function stable Cardiovascular status: blood pressure returned to baseline and stable Postop Assessment: no headache and no apparent nausea or vomiting Anesthetic complications: no Comments: Late entry   No notable events documented.   Last Vitals:  Vitals:   03/29/21 0815  BP: 121/87  Pulse: 76  Resp: 15  Temp: 36.6 C  SpO2: 98%    Last Pain:  Vitals:   03/29/21 0815  TempSrc: Oral  PainSc:                  Louann Sjogren

## 2021-03-29 NOTE — Progress Notes (Signed)
Pt arrived to floor at 1620.

## 2021-03-30 DIAGNOSIS — I10 Essential (primary) hypertension: Secondary | ICD-10-CM | POA: Diagnosis not present

## 2021-03-30 DIAGNOSIS — M1711 Unilateral primary osteoarthritis, right knee: Secondary | ICD-10-CM | POA: Diagnosis not present

## 2021-03-30 DIAGNOSIS — F17210 Nicotine dependence, cigarettes, uncomplicated: Secondary | ICD-10-CM | POA: Diagnosis not present

## 2021-03-30 DIAGNOSIS — Z23 Encounter for immunization: Secondary | ICD-10-CM | POA: Diagnosis not present

## 2021-03-30 DIAGNOSIS — R7303 Prediabetes: Secondary | ICD-10-CM | POA: Diagnosis not present

## 2021-03-30 LAB — CBC
HCT: 36.6 % (ref 36.0–46.0)
Hemoglobin: 11.7 g/dL — ABNORMAL LOW (ref 12.0–15.0)
MCH: 29 pg (ref 26.0–34.0)
MCHC: 32 g/dL (ref 30.0–36.0)
MCV: 90.6 fL (ref 80.0–100.0)
Platelets: 345 10*3/uL (ref 150–400)
RBC: 4.04 MIL/uL (ref 3.87–5.11)
RDW: 14.3 % (ref 11.5–15.5)
WBC: 13.4 10*3/uL — ABNORMAL HIGH (ref 4.0–10.5)
nRBC: 0 % (ref 0.0–0.2)

## 2021-03-30 LAB — BASIC METABOLIC PANEL
Anion gap: 6 (ref 5–15)
BUN: 8 mg/dL (ref 6–20)
CO2: 27 mmol/L (ref 22–32)
Calcium: 8.2 mg/dL — ABNORMAL LOW (ref 8.9–10.3)
Chloride: 103 mmol/L (ref 98–111)
Creatinine, Ser: 0.62 mg/dL (ref 0.44–1.00)
GFR, Estimated: >60 mL/min (ref >60)
Glucose, Bld: 124 mg/dL — ABNORMAL HIGH (ref 70–99)
Potassium: 4.3 mmol/L (ref 3.5–5.1)
Sodium: 136 mmol/L (ref 135–145)

## 2021-03-30 LAB — GLUCOSE, CAPILLARY
Glucose-Capillary: 114 mg/dL — ABNORMAL HIGH (ref 70–99)
Glucose-Capillary: 132 mg/dL — ABNORMAL HIGH (ref 70–99)
Glucose-Capillary: 149 mg/dL — ABNORMAL HIGH (ref 70–99)
Glucose-Capillary: 154 mg/dL — ABNORMAL HIGH (ref 70–99)

## 2021-03-30 MED ORDER — OXYCODONE-ACETAMINOPHEN 5-325 MG PO TABS
1.0000 | ORAL_TABLET | ORAL | Status: DC | PRN
  Administered 2021-03-30 – 2021-03-31 (×2): 1 via ORAL
  Filled 2021-03-30 (×2): qty 1

## 2021-03-30 MED ORDER — INFLUENZA VAC SPLIT QUAD 0.5 ML IM SUSY
0.5000 mL | PREFILLED_SYRINGE | INTRAMUSCULAR | Status: AC
  Administered 2021-03-31: 0.5 mL via INTRAMUSCULAR
  Filled 2021-03-30: qty 0.5

## 2021-03-30 NOTE — Evaluation (Signed)
Physical Therapy Evaluation Patient Details Name: Robin Sherman MRN: [id number] DOB: November 13, 1960 Today's Date: 03/30/2021  RIGHT KNEE ROM:  0 - 93 degrees AMBULATION DISTANCE: 5 feet using RW with Min guard/Min assist   History of Present Illness  Robin Sherman is a 60 y/o female, s/p Right TKA on 03/29/21, with the diagnosis of right knee osteoarthritis.  Clinical Impression  Patient instructed in HEP with written instructions provided with fair/good return demonstrated, limited to a few steps at bedside mostly due to c/o severe pain right knee and c/o dizziness with BP low - RN aware.  Patient tolerated sitting up in chair after therapy with RLE dangling.  Patient will benefit from continued physical therapy in hospital and recommended venue below to increase strength, balance, endurance for safe ADLs and gait.         Recommendations for follow up therapy are one component of a multi-disciplinary discharge planning process, led by the attending physician.  Recommendations may be updated based on patient status, additional functional criteria and insurance authorization.  Follow Up Recommendations Home health PT;Supervision for mobility/OOB;Supervision - Intermittent    Equipment Recommendations       Recommendations for Other Services       Precautions / Restrictions Precautions Precautions: Fall Restrictions Weight Bearing Restrictions: Yes RLE Weight Bearing: Weight bearing as tolerated      Mobility  Bed Mobility Overal bed mobility: Needs Assistance Bed Mobility: Supine to Sit     Supine to sit: Supervision;Min guard     General bed mobility comments: increased time, labored movement with HOB flat    Transfers Overall transfer level: Needs assistance Equipment used: Rolling walker (2 wheeled) Transfers: Sit to/from [facility] Sit to Stand: Supervision Stand pivot transfers: Supervision;Min guard       General transfer comment: labored  movemet, poor tolerance for weightbearing on RLE due to increased pain  Ambulation/Gait Ambulation/Gait assistance: Min guard;Min assist Gait Distance (Feet): 5 Feet Assistive device: Rolling walker (2 wheeled) Gait Pattern/deviations: Decreased step length - right;Decreased step length - left;Decreased stance time - right;Decreased stride length;Antalgic Gait velocity: decreased   General Gait Details: limited to a few steps at bedside with c/o increased right knee pain with weightbearing, limited mostly due to c/o dizziness  Stairs            Wheelchair Mobility    Modified Rankin (Stroke Patients Only)       Balance Overall balance assessment: Needs assistance Sitting-balance support: Feet supported;No upper extremity supported Sitting balance-Leahy Scale: Good Sitting balance - Comments: seated at EOB   Standing balance support: During functional activity;Bilateral upper extremity supported Standing balance-Leahy Scale: Fair Standing balance comment: using RW                             Pertinent Vitals/Pain Pain Assessment: 0-10 Pain Score: 6  Pain Location: right knee Pain Descriptors / Indicators: Sore;Aching;Grimacing;Guarding Pain Intervention(s): Limited activity within patient's tolerance;Monitored during session;RN gave pain meds during session;Repositioned    Home Living Family/patient expects to be discharged to:: Private residence Living Arrangements: Spouse/significant other Available Help at Discharge: Family;Available PRN/intermittently;Available 24 hours/day Type of Home: House Home Access: Stairs to enter Entrance Stairs-Rails: Right;Left (to wide to reach both) Entrance Stairs-Number of Steps: 3 Home Layout: Two level;Able to live on main level with bedroom/bathroom;Full bath on main level Home Equipment: Walker - 2 wheels;Cane - single point;Toilet riser      Prior Function Level  of Independence: Independent          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[employer], drives, 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    Extremity/Trunk Assessment   Upper Extremity Assessment Upper Extremity Assessment: Overall WFL for tasks assessed    Lower Extremity Assessment Lower Extremity Assessment: Generalized weakness;RLE deficits/detail RLE Deficits / Details: grossly -4/5 RLE: Unable to fully assess due to pain RLE Sensation: WNL RLE Coordination: WNL    Cervical / Trunk Assessment Cervical / Trunk Assessment: Normal  Communication   Communication: No difficulties  Cognition Arousal/Alertness: Awake/alert Behavior During Therapy: WFL for tasks assessed/performed Overall Cognitive Status: Within Functional Limits for tasks assessed                                        General Comments      Exercises Total Joint Exercises Ankle Circles/Pumps: Supine;15 reps;Right;Strengthening;AROM Quad Sets: Supine;10 reps;Right;Strengthening;AROM Short Arc Quad: Supine;10 reps;Right;Strengthening;AROM Heel Slides: Supine;10 reps;Right;Strengthening;AROM Goniometric ROM: right knee: 3 - 93 degrees   Assessment/Plan    PT Assessment Patient needs continued PT services  PT Problem List Decreased strength;Decreased activity tolerance;Decreased balance;Decreased mobility;Decreased range of motion;Pain       PT Treatment Interventions DME instruction;Gait training;Stair training;Functional mobility training;Therapeutic activities;Therapeutic exercise;Balance training;Patient/family education    PT Goals (Current goals can be found in the Care Plan section)  Acute Rehab PT Goals Patient Stated Goal: return home with family to assist PT Goal Formulation: With patient Time For Goal Achievement: 04/01/21 Potential to Achieve Goals: Good    Frequency BID   Barriers to discharge        Co-evaluation               AM-PAC PT "6 Clicks" Mobility  Outcome Measure  Help needed turning from your back to your side while in a flat bed without using bedrails?: None Help needed moving from lying on your back to sitting on the side of a flat bed without using bedrails?: A Little Help needed moving to and from a bed to a chair (including a wheelchair)?: A Little Help needed standing up from a chair using your arms (e.g., wheelchair or bedside chair)?: A Little Help needed to walk in hospital room?: A Lot Help needed climbing 3-5 steps with a railing? : A Lot 6 Click Score: 17    End of Session   Activity Tolerance: Patient tolerated treatment well;Patient limited by fatigue;Patient limited by pain Patient left: in chair;with call bell/phone within reach Nurse Communication: Mobility status PT Visit Diagnosis: Unsteadiness on feet (R26.81);Other abnormalities of gait and mobility (R26.89);Muscle weakness (generalized) (M62.81)    Time: [id number] PT Time Calculation (min) (ACUTE ONLY): 23 min   Charges:   PT Evaluation $PT Eval Moderate Complexity: 1 Mod PT Treatments $Therapeutic Activity: 23-37 mins        9:06 AM, 03/30/21 Lonell Grandchild, MPT Physical Therapist with [facility] 336 [phone] office [phone] mobile phone

## 2021-03-30 NOTE — TOC Initial Note (Signed)
Transition of Care [facility]) - Initial/Assessment Note    Patient Details  Name: Robin Sherman MRN: [id number] Date of Birth: 24-Apr-1961  Transition of Care [facility]) CM/SW Contact:    Iona Beard, [place] Phone Number: 03/30/2021, 1:50 PM  Clinical Narrative:                 [facility] consulted for [facility] services. CSW spoke to [place] with [facility] who states that he has been sent the pt referral and they are going to start services at d/c. TOC signing off.   Expected Discharge Plan: [place] Barriers to Discharge: Continued Medical Work up   Patient Goals and CMS Choice Patient states their goals for this hospitalization and ongoing recovery are:: Home with [facility] CMS Medicare.gov Compare Post Acute Care list provided to:: Patient Choice offered to / list presented to : Patient  Expected Discharge Plan and Services Expected Discharge Plan: [place] In-house Referral: Clinical Social Work Discharge Planning Services: CM Consult Post Acute Care Choice: [place] arrangements for the past 2 months: [place]                                      Prior Living Arrangements/Services Living arrangements for the past 2 months: Single Family Home   Patient language and need for interpreter reviewed:: Yes Do you feel safe going back to the place where you live?: Yes      Need for Family Participation in Patient Care: Yes (Comment) Care giver support system in place?: Yes (comment) Current home services: Home PT Criminal Activity/Legal Involvement Pertinent to Current Situation/Hospitalization: No - Comment as needed  Activities of Daily Living Home Assistive Devices/Equipment: Walker (specify type) ADL Screening (condition at time of admission) Patient's cognitive ability adequate to safely complete daily activities?: Yes Is the patient deaf or have difficulty hearing?: No Does the patient have difficulty seeing, even when wearing  glasses/contacts?: No Does the patient have difficulty concentrating, remembering, or making decisions?: No Patient able to express need for assistance with ADLs?: Yes Does the patient have difficulty dressing or bathing?: No Independently performs ADLs?: Yes (appropriate for developmental age) Does the patient have difficulty walking or climbing stairs?: No Weakness of Legs: Right Weakness of Arms/Hands: None  Permission Sought/Granted                  Emotional Assessment Appearance:: Appears stated age       Alcohol / Substance Use: Not Applicable Psych Involvement: No (comment)  Admission diagnosis:  S/P TKR (total knee replacement), right [Z96.651] Patient Active Problem List   Diagnosis Date Noted   S/P TKR (total knee replacement), right 03/29/2021   Isthmic spondylolisthesis 06/14/2020   Polypharmacy 02/09/2020   Encounter for screening colonoscopy 02/09/2020   DDD (degenerative disc disease), lumbar 12/08/2019   S/P right knee arthroscopy 12/12/18 *with chondroplasty patella  12/19/2018   Borderline diabetes 07/02/2015   Vitamin D deficiency 07/02/2015   Lumbar back pain 07/02/2015   Insomnia 10/29/2013   Arthritis of knee, degenerative 10/13/2013   Hand dermatitis 03/02/2012   Essential hypertension, benign 09/21/2011   Class 2 obesity 09/21/2011   Shoulder pain, right 09/21/2011   Asthma, intermittent 09/21/2011   Tobacco user 09/21/2011   PCP:  Celene Squibb, MD Pharmacy:   [place]  [place] [address] Phone: [phone] Fax: [phone]     Social Determinants of Health (SDOH) Interventions    Readmission Risk Interventions No flowsheet data found.

## 2021-03-30 NOTE — Plan of Care (Signed)
  Problem: Acute Rehab PT Goals(only PT should resolve) Goal: Pt Will Go Supine/Side To Sit Outcome: Progressing Flowsheets (Taken 03/30/2021 0907) Pt will go Supine/Side to Sit: with modified independence Goal: Patient Will Transfer Sit To/From Stand Outcome: Progressing Flowsheets (Taken 03/30/2021 0907) Patient will transfer sit to/from stand: with modified independence Goal: Pt Will Transfer Bed To Chair/Chair To Bed Outcome: Progressing Flowsheets (Taken 03/30/2021 0907) Pt will Transfer Bed to Chair/Chair to Bed: with modified independence Goal: Pt Will Ambulate Outcome: Progressing Flowsheets (Taken 03/30/2021 0907) Pt will Ambulate:  > 125 feet  with supervision  with modified independence  with rolling walker   9:08 AM, 03/30/21 Lonell Grandchild, MPT Physical Therapist with [facility] [phone] office [phone] mobile phone

## 2021-03-30 NOTE — Progress Notes (Signed)
Physical Therapy Treatment Patient Details Name: Robin Sherman MRN: [id number] DOB: 1960/08/13 Today's Date: 03/30/2021   History of Present Illness Robin Sherman is a 60 y/o female, s/p Right TKA on 03/29/21, with the diagnosis of right knee osteoarthritis.    PT Comments    PT laying in bed, agreeable to participate with therapy this afternoon.  Overall slow mobility with AA from therapist to mobilize Rt LE in and out of bed. Pt able to increase ambulation distance to 40 feet.   PT reported increased pain after ambulation when returned to supine and nursing called for medication.  Pt overall doing well.    Recommendations for follow up therapy are one component of a multi-disciplinary discharge planning process, led by the attending physician.  Recommendations may be updated based on patient status, additional functional criteria and insurance authorization.  Follow Up Recommendations        Equipment Recommendations       Recommendations for Other Services       Precautions / Restrictions Precautions Precautions: Fall Restrictions Weight Bearing Restrictions: No RLE Weight Bearing: Weight bearing as tolerated     Mobility  Bed Mobility Overal bed mobility: Needs Assistance Bed Mobility: Supine to Sit     Supine to sit: Supervision;Min guard     General bed mobility comments: increased time, labored movement with HOB flat    Transfers Overall transfer level: Needs assistance Equipment used: Rolling walker (2 wheeled) Transfers: Sit to/from [facility] Sit to Stand: Supervision Stand pivot transfers: Supervision;Min guard       General transfer comment: labored movemet, poor tolerance for weightbearing on RLE due to increased pain  Ambulation/Gait Ambulation/Gait assistance: Min guard;Min assist Gait Distance (Feet): 40 Feet Assistive device: Rolling walker (2 wheeled) Gait Pattern/deviations: Decreased step length - right;Decreased step  length - left;Decreased stance time - right;Decreased stride length;Antalgic Gait velocity: decreased            Cognition Arousal/Alertness: Awake/alert Behavior During Therapy: WFL for tasks assessed/performed Overall Cognitive Status: Within Functional Limits for tasks assessed                                               Pertinent Vitals/Pain Pain Assessment: 0-10 Pain Score: 6  Pain Location: right knee only c/o pain after ambulation Pain Descriptors / Indicators: Sore;Aching;Grimacing;Guarding      AM-PAC PT "6 Clicks" Mobility   Outcome Measure  Help needed turning from your back to your side while in a flat bed without using bedrails?: None Help needed moving from lying on your back to sitting on the side of a flat bed without using bedrails?: A Little Help needed moving to and from a bed to a chair (including a wheelchair)?: A Little Help needed standing up from a chair using your arms (e.g., wheelchair or bedside chair)?: A Little Help needed to walk in hospital room?: A Little Help needed climbing 3-5 steps with a railing? : A Lot 6 Click Score: 18    End of Session Equipment Utilized During Treatment: Gait belt Activity Tolerance: Patient tolerated treatment well;Patient limited by fatigue;Patient limited by pain Patient left: in chair;with call bell/phone within reach Nurse Communication: Mobility status PT Visit Diagnosis: Unsteadiness on feet (R26.81);Other abnormalities of gait and mobility (R26.89);Muscle weakness (generalized) (M62.81)     Time: [id number] PT Time Calculation (min) (ACUTE ONLY): 24 min  Charges:  $Gait Training: 8-22 mins $Therapeutic Activity: 8-22 mins                    Teena Irani, PTA/CLT [phone]    [name] 03/30/2021, 5:45 PM

## 2021-03-30 NOTE — Progress Notes (Signed)
Patient ID: Robin Sherman, female   DOB: 06-28-1961, 60 y.o.   MRN: [id number]  Postop day 1 right total knee  Patient is doing reasonably well her glucose is a little high but she is on insulin sliding scale  Pain is poorly controlled we will switch to Percocet  Patient will be in hospital till tomorrow to get pain under control

## 2021-03-31 ENCOUNTER — Encounter (HOSPITAL_COMMUNITY): Payer: Self-pay | Admitting: Orthopedic Surgery

## 2021-03-31 DIAGNOSIS — Z96651 Presence of right artificial knee joint: Secondary | ICD-10-CM | POA: Diagnosis not present

## 2021-03-31 DIAGNOSIS — M1711 Unilateral primary osteoarthritis, right knee: Secondary | ICD-10-CM | POA: Diagnosis not present

## 2021-03-31 DIAGNOSIS — Z23 Encounter for immunization: Secondary | ICD-10-CM | POA: Diagnosis not present

## 2021-03-31 DIAGNOSIS — I10 Essential (primary) hypertension: Secondary | ICD-10-CM | POA: Diagnosis not present

## 2021-03-31 DIAGNOSIS — F17210 Nicotine dependence, cigarettes, uncomplicated: Secondary | ICD-10-CM | POA: Diagnosis not present

## 2021-03-31 DIAGNOSIS — R7303 Prediabetes: Secondary | ICD-10-CM | POA: Diagnosis not present

## 2021-03-31 LAB — GLUCOSE, CAPILLARY
Glucose-Capillary: 123 mg/dL — ABNORMAL HIGH (ref 70–99)
Glucose-Capillary: 134 mg/dL — ABNORMAL HIGH (ref 70–99)

## 2021-03-31 LAB — CBC
HCT: 39.4 % (ref 36.0–46.0)
Hemoglobin: 12.5 g/dL (ref 12.0–15.0)
MCH: 28 pg (ref 26.0–34.0)
MCHC: 31.7 g/dL (ref 30.0–36.0)
MCV: 88.3 fL (ref 80.0–100.0)
Platelets: 362 10*3/uL (ref 150–400)
RBC: 4.46 MIL/uL (ref 3.87–5.11)
RDW: 14 % (ref 11.5–15.5)
WBC: 14.5 10*3/uL — ABNORMAL HIGH (ref 4.0–10.5)
nRBC: 0 % (ref 0.0–0.2)

## 2021-03-31 MED ORDER — TRAMADOL HCL 50 MG PO TABS: 50.0000 mg | ORAL_TABLET | Freq: Four times a day (QID) | ORAL | 0 refills | Status: AC

## 2021-03-31 MED ORDER — OXYCODONE-ACETAMINOPHEN 10-325 MG PO TABS: 1.0000 | ORAL_TABLET | ORAL | 0 refills | Status: AC | PRN

## 2021-03-31 MED ORDER — DOCUSATE SODIUM 100 MG PO CAPS: 100.0000 mg | ORAL_CAPSULE | Freq: Two times a day (BID) | ORAL | 0 refills | Status: DC

## 2021-03-31 MED ORDER — ASPIRIN 325 MG PO TBEC: 325.0000 mg | DELAYED_RELEASE_TABLET | Freq: Every day | ORAL | 0 refills | Status: DC

## 2021-03-31 MED ORDER — METHOCARBAMOL 500 MG PO TABS: 500.0000 mg | ORAL_TABLET | Freq: Four times a day (QID) | ORAL | 1 refills | Status: DC | PRN

## 2021-03-31 NOTE — TOC Transition Note (Signed)
Transition of Care [facility]) - CM/SW Discharge Note   Patient Details  Name: Robin Sherman MRN: [id number] Date of Birth: [birth year]  Transition of Care [facility]) CM/SW Contact:  Salome Arnt, LCSW Phone Number: 03/31/2021, 10:49 AM   Clinical Narrative:  Pt d/c today with [place] [facility]. LCSW confirmed with Marjory Lies with [facility] that orders were received. Pt states she has a ride today. RN updated. No other needs reported by pt.      Final next level of care: [place] Barriers to Discharge: Barriers Resolved   Patient Goals and CMS Choice Patient states their goals for this hospitalization and ongoing recovery are:: Home with [facility] CMS Medicare.gov Compare Post Acute Care list provided to:: Patient Choice offered to / list presented to : Patient  Discharge Placement                    Patient and family notified of of transfer: 03/31/21  Discharge Plan and Services In-house Referral: Clinical Social Work Discharge Planning Services: CM Consult Post Acute Care Choice: Home Health          DME Arranged: N/A         HH Arranged: PT HH Agency: [place] Date [place]: 03/31/21 Time [place]: [id number] Representative spoke with at [place]: [place] ([place]) Interventions     Readmission Risk Interventions No flowsheet data found.

## 2021-03-31 NOTE — Discharge Summary (Signed)
Physician Discharge Summary  Patient ID: Robin Sherman MRN: [id number] DOB/AGE: [birth year] 60 y.o.  Admit date: 03/29/2021 Discharge date: 03/31/2021  Admission Diagnoses: Osteoarthritis right knee  Discharge Diagnoses: Osteoarthritis right knee  Discharged Condition: Stable  Procedure: Right total knee implant  Attune fixed-bearing posterior stabilized total knee  Hospital Course:   The patient's hospital course was unremarkable other than difficult pain control.  She had spinal anesthesia with saphenous nerve block on hospital day 1.  She had poor pain control on hospital day 2 and was kept in the hospital and pain medication was switched to Percocet. The patient's outpatient pain medication included Percocet 7.5 mg indicating opioid tolerance  It does appear that she had a preop opioid holiday for approximately 15 days  She did to ambulate 40 feet her range of motion was 0-90 and she is cleared for discharge home  CBC    Component Value Date/Time   WBC 14.5 (H) 03/31/2021 0542   RBC 4.46 03/31/2021 0542   HGB 12.5 03/31/2021 0542   HCT 39.4 03/31/2021 0542   PLT 362 03/31/2021 0542   MCV 88.3 03/31/2021 0542   MCH 28.0 03/31/2021 0542   MCHC 31.7 03/31/2021 0542   RDW 14.0 03/31/2021 0542   LYMPHSABS 2.5 03/25/2021 1213   MONOABS 0.6 03/25/2021 1213   EOSABS 0.6 (H) 03/25/2021 1213   BASOSABS 0.1 03/25/2021 1213    BMP Latest Ref Rng & Units 03/30/2021 03/25/2021 09/20/2020  Glucose 70 - 99 mg/dL 124(H) 129(H) 111(H)  BUN 6 - 20 mg/dL '8 12 12  '$ Creatinine 0.44 - 1.00 mg/dL 0.62 0.60 0.71  BUN/Creat Ratio 6 - 22 (calc) - - NOT APPLICABLE  Sodium A999333 - 145 mmol/L 136 139 138  Potassium 3.5 - 5.1 mmol/L 4.3 3.8 4.4  Chloride 98 - 111 mmol/L 103 107 103  CO2 22 - 32 mmol/L '27 23 23  '$ Calcium 8.9 - 10.3 mg/dL 8.2(L) 9.1 10.1        Discharge Exam: BP (!) 158/83 (BP Location: Right Arm)   Pulse 94   Temp 98.3 F (36.8 C) (Oral)   Resp 19   Ht '5\' 4"'$  (1.626 m)    Wt 98 kg   LMP 04/15/2013 Comment: partial  SpO2 98%   BMI 37.08 kg/m  Physical Exam  Mentation normal  Neurovascular exam good pulses perfusion color and temperature normal sensation  Disposition: Discharge disposition: 01-Home or Self Care      Discharge Instructions     Call MD / Call 911   Complete by: As directed    If you experience chest pain or shortness of breath, CALL 911 and be transported to the hospital emergency room.  If you develope a fever above 101 F, pus (white drainage) or increased drainage or redness at the wound, or calf pain, call your surgeon's office.   Constipation Prevention   Complete by: As directed    Drink plenty of fluids.  Prune juice may be helpful.  You may use a stool softener, such as Colace (over the counter) 100 mg twice a day.  Use MiraLax (over the counter) for constipation as needed.   Diet - low sodium heart healthy   Complete by: As directed    Discharge instructions   Complete by: As directed    Use the blue pillow 3 times a day for 30 minutes  Use that CPM machine 2 hours 3 times a day  You do not have to change the dressing  Do the  exercises as taught to you by physical therapy  Use the walker and weight-bear as tolerated  No driving for 4 weeks   Increase activity slowly as tolerated   Complete by: As directed    Post-operative opioid taper instructions:   Complete by: As directed    POST-OPERATIVE OPIOID TAPER INSTRUCTIONS: It is important to wean off of your opioid medication as soon as possible. If you do not need pain medication after your surgery it is ok to stop day one. Opioids include: Codeine, Hydrocodone(Norco, Vicodin), Oxycodone(Percocet, oxycontin) and hydromorphone amongst others.  Long term and even short term use of opiods can cause: Increased pain response Dependence Constipation Depression Respiratory depression And more.  Withdrawal symptoms can include Flu like symptoms Nausea,  vomiting And more Techniques to manage these symptoms Hydrate well Eat regular healthy meals Stay active Use relaxation techniques(deep breathing, meditating, yoga) Do Not substitute Alcohol to help with tapering If you have been on opioids for less than two weeks and do not have pain than it is ok to stop all together.  Plan to wean off of opioids This plan should start within one week post op of your joint replacement. Maintain the same interval or time between taking each dose and first decrease the dose.  Cut the total daily intake of opioids by one tablet each day Next start to increase the time between doses. The last dose that should be eliminated is the evening dose.         Allergies as of 03/31/2021       Reactions   Codeine Hives   Acetaminophen Hives, Rash   If take with other medication   Latex Rash   Naproxen Rash   Patient tolerates Ibuprofen/ poss allergy - little blisters on fingers        Medication List     STOP taking these medications    meloxicam 7.5 MG tablet Commonly known as: Mobic       TAKE these medications    albuterol 108 (90 Base) MCG/ACT inhaler Commonly known as: VENTOLIN HFA Inhale 2 puffs into the lungs every 6 (six) hours as needed. What changed: reasons to take this   amLODipine 10 MG tablet Commonly known as: NORVASC Take 10 mg by mouth daily.   aspirin 325 MG EC tablet Take 1 tablet (325 mg total) by mouth daily with breakfast.   betamethasone dipropionate 0.05 % cream Apply 1 application topically daily at 2 am.   clobetasol cream 0.05 % Commonly known as: TEMOVATE Apply 1 application topically 2 (two) times daily.   desonide 0.05 % cream Commonly known as: DESOWEN Apply 1 application topically daily at 2 am.   docusate sodium 100 MG capsule Commonly known as: COLACE Take 1 capsule (100 mg total) by mouth 2 (two) times daily.   gabapentin 300 MG capsule Commonly known as: NEURONTIN Take 1 capsule (300 mg  total) by mouth 2 (two) times daily. What changed: when to take this   hydrOXYzine 25 MG tablet Commonly known as: ATARAX/VISTARIL Take 1 tablet (25 mg total) by mouth 3 (three) times daily as needed. What changed:  how much to take when to take this reasons to take this   lisinopril-hydrochlorothiazide 20-12.5 MG tablet Commonly known as: ZESTORETIC Take 1 tablet by mouth daily.   metFORMIN 500 MG tablet Commonly known as: GLUCOPHAGE Take 1 tablet (500 mg total) by mouth daily with breakfast.   methocarbamol 500 MG tablet Commonly known as: ROBAXIN Take 1 tablet (  500 mg total) by mouth every 6 (six) hours as needed for muscle spasms.   oxyCODONE-acetaminophen 10-325 MG tablet Commonly known as: Percocet Take 1 tablet by mouth every 4 (four) hours as needed for up to 7 days for pain.   traMADol 50 MG tablet Commonly known as: ULTRAM Take 1 tablet (50 mg total) by mouth every 6 (six) hours for 7 days.   VITAMIN C PO Take 1 tablet by mouth daily.   Vitamin D 50 MCG (2000 UT) tablet Take 2,000 Units by mouth daily.         Signed: Arther Abbott 03/31/2021, 10:06 AM

## 2021-03-31 NOTE — Progress Notes (Signed)
Physical Therapy Treatment Patient Details Name: Robin Sherman MRN: [id number] DOB: 1961/04/09 Today's Date: 03/31/2021  RIGHT KNEE ROM: 0 - 90 degrees AMBULATION DISTANCE: 70 feet using RW with Supervision/Mod Independent   History of Present Illness Robin Sherman is a 60 y/o female, s/p Right TKA on 03/29/21, with the diagnosis of right knee osteoarthritis.    PT Comments    Patient demonstrates increased endurance/distance for ambulation with fair/good return for right heel to toe stepping after verbal cueing, fair/good return for going up/down steps in stairwell having to use both hands on 1 side rail with step to pattern and sideways when stepping down without loss of balance with Min guard/Min assist.  Patient acknowledged understanding to have family members assisting when going up/down steps at home.  Patient tolerated sitting up in chair after therapy with RLE dangling.  Patient will benefit from continued physical therapy in hospital and recommended venue below to increase strength, balance, endurance for safe ADLs and gait.     Recommendations for follow up therapy are one component of a multi-disciplinary discharge planning process, led by the attending physician.  Recommendations may be updated based on patient status, additional functional criteria and insurance authorization.  Follow Up Recommendations  Home health PT;Supervision for mobility/OOB;Supervision - Intermittent     Equipment Recommendations       Recommendations for Other Services       Precautions / Restrictions Precautions Precautions: Fall Restrictions Weight Bearing Restrictions: Yes RLE Weight Bearing: Weight bearing as tolerated     Mobility  Bed Mobility Overal bed mobility: Modified Independent             General bed mobility comments: has difficulty moving RLE during sitting to supine, good return for using LLE to help lift RLE onto bed without assistance    Transfers Overall  transfer level: Needs assistance Equipment used: Rolling walker (2 wheeled) Transfers: Sit to/from [facility] Sit to Stand: Modified independent (Device/Increase time);Supervision Stand pivot transfers: Modified independent (Device/Increase time);Supervision       General transfer comment: good return for transferring to/from commode in bathroom  Ambulation/Gait Ambulation/Gait assistance: Supervision;Modified independent (Device/Increase time) Gait Distance (Feet): 70 Feet Assistive device: Rolling walker (2 wheeled) Gait Pattern/deviations: Decreased step length - right;Decreased step length - left;Decreased stance time - right;Decreased stride length;Antalgic Gait velocity: decreased   General Gait Details: demonstrates increased endurance/distance for ambulation with fair/good return for right heel to toe stepping after verbal cueing, no loss of balance   Stairs Stairs: Yes Stairs assistance: Min guard;Min assist Stair Management: Sideways;One rail Right;One rail Left;Step to pattern Number of Stairs: 3 General stair comments: demonstrates good return for going up/down 3 steps using both hands on 1 rail without loss of balance and understanding acknowledged to have spouse assist when going up/down steps   Wheelchair Mobility    Modified [place] (Stroke Patients Only)       Balance Overall balance assessment: Needs assistance Sitting-balance support: Feet supported;No upper extremity supported Sitting balance-Leahy Scale: Good Sitting balance - Comments: seated at EOB   Standing balance support: During functional activity;Bilateral upper extremity supported Standing balance-Leahy Scale: Fair Standing balance comment: fair/good using RW                            Cognition Arousal/Alertness: Awake/alert Behavior During Therapy: WFL for tasks assessed/performed Overall Cognitive Status: Within Functional Limits for tasks assessed  Exercises Total Joint Exercises Ankle Circles/Pumps: Supine;Right;Strengthening;AROM;10 reps Quad Sets: Supine;10 reps;Right;Strengthening;AROM Short Arc Quad: Supine;10 reps;Right;Strengthening;AAROM Heel Slides: Supine;10 reps;Right;Strengthening;AROM Goniometric ROM: right knee: 0 - 90 degrees    General Comments        Pertinent Vitals/Pain Pain Assessment: 0-10 Pain Score: 5  Pain Location: right knee Pain Descriptors / Indicators: Sore;Grimacing Pain Intervention(s): Limited activity within patient's tolerance;Monitored during session;Repositioned;Premedicated before session    Home Living                      Prior Function            PT Goals (current goals can now be found in the care plan section) Acute Rehab PT Goals Patient Stated Goal: return home with family to assist PT Goal Formulation: With patient Time For Goal Achievement: 04/01/21 Potential to Achieve Goals: Good Progress towards PT goals: Progressing toward goals    Frequency    BID      PT Plan Current plan remains appropriate    Co-evaluation              AM-PAC PT "6 Clicks" Mobility   Outcome Measure  Help needed turning from your back to your side while in a flat bed without using bedrails?: None Help needed moving from lying on your back to sitting on the side of a flat bed without using bedrails?: A Little Help needed moving to and from a bed to a chair (including a wheelchair)?: A Little Help needed standing up from a chair using your arms (e.g., wheelchair or bedside chair)?: None Help needed to walk in hospital room?: A Little Help needed climbing 3-5 steps with a railing? : A Little 6 Click Score: 20    End of Session   Activity Tolerance: Patient tolerated treatment well Patient left: in chair;with call bell/phone within reach Nurse Communication: Mobility status PT Visit Diagnosis: Unsteadiness on feet  (R26.81);Other abnormalities of gait and mobility (R26.89);Muscle weakness (generalized) (M62.81)     Time: [id number] PT Time Calculation (min) (ACUTE ONLY): 32 min  Charges:  $Gait Training: 8-22 mins $Therapeutic Exercise: 8-22 mins                     9:56 AM, 03/31/21 Lonell Grandchild, MPT Physical Therapist with [facility] 336 [phone] office [phone] mobile phone

## 2021-04-02 DIAGNOSIS — M5432 Sciatica, left side: Secondary | ICD-10-CM | POA: Diagnosis not present

## 2021-04-02 DIAGNOSIS — Z90722 Acquired absence of ovaries, bilateral: Secondary | ICD-10-CM | POA: Diagnosis not present

## 2021-04-02 DIAGNOSIS — Z471 Aftercare following joint replacement surgery: Secondary | ICD-10-CM | POA: Diagnosis not present

## 2021-04-02 DIAGNOSIS — G4700 Insomnia, unspecified: Secondary | ICD-10-CM | POA: Diagnosis not present

## 2021-04-02 DIAGNOSIS — Z7984 Long term (current) use of oral hypoglycemic drugs: Secondary | ICD-10-CM | POA: Diagnosis not present

## 2021-04-02 DIAGNOSIS — Z96651 Presence of right artificial knee joint: Secondary | ICD-10-CM | POA: Diagnosis not present

## 2021-04-02 DIAGNOSIS — M6530 Trigger finger, unspecified finger: Secondary | ICD-10-CM | POA: Diagnosis not present

## 2021-04-02 DIAGNOSIS — Z7982 Long term (current) use of aspirin: Secondary | ICD-10-CM | POA: Diagnosis not present

## 2021-04-02 DIAGNOSIS — F17210 Nicotine dependence, cigarettes, uncomplicated: Secondary | ICD-10-CM | POA: Diagnosis not present

## 2021-04-02 DIAGNOSIS — E119 Type 2 diabetes mellitus without complications: Secondary | ICD-10-CM | POA: Diagnosis not present

## 2021-04-02 DIAGNOSIS — Z9181 History of falling: Secondary | ICD-10-CM | POA: Diagnosis not present

## 2021-04-02 DIAGNOSIS — J45909 Unspecified asthma, uncomplicated: Secondary | ICD-10-CM | POA: Diagnosis not present

## 2021-04-02 DIAGNOSIS — Z86010 Personal history of colonic polyps: Secondary | ICD-10-CM | POA: Diagnosis not present

## 2021-04-02 DIAGNOSIS — G709 Myoneural disorder, unspecified: Secondary | ICD-10-CM | POA: Diagnosis not present

## 2021-04-02 DIAGNOSIS — Z90710 Acquired absence of both cervix and uterus: Secondary | ICD-10-CM | POA: Diagnosis not present

## 2021-04-02 DIAGNOSIS — I10 Essential (primary) hypertension: Secondary | ICD-10-CM | POA: Diagnosis not present

## 2021-04-04 ENCOUNTER — Telehealth: Payer: Self-pay | Admitting: Orthopedic Surgery

## 2021-04-04 NOTE — Telephone Encounter (Signed)
Called to give verbal orders  

## 2021-04-04 NOTE — Telephone Encounter (Signed)
Call/voice message received from [place] per Pilar Plate requesting verbal orders, 1 time per week for 1 week, and 3 times per week for 2 weeks. [phone]

## 2021-04-05 DIAGNOSIS — Z7984 Long term (current) use of oral hypoglycemic drugs: Secondary | ICD-10-CM | POA: Diagnosis not present

## 2021-04-05 DIAGNOSIS — Z90722 Acquired absence of ovaries, bilateral: Secondary | ICD-10-CM | POA: Diagnosis not present

## 2021-04-05 DIAGNOSIS — M6530 Trigger finger, unspecified finger: Secondary | ICD-10-CM | POA: Diagnosis not present

## 2021-04-05 DIAGNOSIS — Z471 Aftercare following joint replacement surgery: Secondary | ICD-10-CM | POA: Diagnosis not present

## 2021-04-05 DIAGNOSIS — F17210 Nicotine dependence, cigarettes, uncomplicated: Secondary | ICD-10-CM | POA: Diagnosis not present

## 2021-04-05 DIAGNOSIS — Z9181 History of falling: Secondary | ICD-10-CM | POA: Diagnosis not present

## 2021-04-05 DIAGNOSIS — G4700 Insomnia, unspecified: Secondary | ICD-10-CM | POA: Diagnosis not present

## 2021-04-05 DIAGNOSIS — Z7982 Long term (current) use of aspirin: Secondary | ICD-10-CM | POA: Diagnosis not present

## 2021-04-05 DIAGNOSIS — E119 Type 2 diabetes mellitus without complications: Secondary | ICD-10-CM | POA: Diagnosis not present

## 2021-04-05 DIAGNOSIS — Z90710 Acquired absence of both cervix and uterus: Secondary | ICD-10-CM | POA: Diagnosis not present

## 2021-04-05 DIAGNOSIS — M5432 Sciatica, left side: Secondary | ICD-10-CM | POA: Diagnosis not present

## 2021-04-05 DIAGNOSIS — Z96651 Presence of right artificial knee joint: Secondary | ICD-10-CM | POA: Diagnosis not present

## 2021-04-05 DIAGNOSIS — G709 Myoneural disorder, unspecified: Secondary | ICD-10-CM | POA: Diagnosis not present

## 2021-04-05 DIAGNOSIS — I10 Essential (primary) hypertension: Secondary | ICD-10-CM | POA: Diagnosis not present

## 2021-04-05 DIAGNOSIS — J45909 Unspecified asthma, uncomplicated: Secondary | ICD-10-CM | POA: Diagnosis not present

## 2021-04-05 DIAGNOSIS — Z86010 Personal history of colonic polyps: Secondary | ICD-10-CM | POA: Diagnosis not present

## 2021-04-07 DIAGNOSIS — M5432 Sciatica, left side: Secondary | ICD-10-CM | POA: Diagnosis not present

## 2021-04-07 DIAGNOSIS — M6530 Trigger finger, unspecified finger: Secondary | ICD-10-CM | POA: Diagnosis not present

## 2021-04-07 DIAGNOSIS — G709 Myoneural disorder, unspecified: Secondary | ICD-10-CM | POA: Diagnosis not present

## 2021-04-07 DIAGNOSIS — Z90722 Acquired absence of ovaries, bilateral: Secondary | ICD-10-CM | POA: Diagnosis not present

## 2021-04-07 DIAGNOSIS — Z86010 Personal history of colonic polyps: Secondary | ICD-10-CM | POA: Diagnosis not present

## 2021-04-07 DIAGNOSIS — Z9181 History of falling: Secondary | ICD-10-CM | POA: Diagnosis not present

## 2021-04-07 DIAGNOSIS — Z7982 Long term (current) use of aspirin: Secondary | ICD-10-CM | POA: Diagnosis not present

## 2021-04-07 DIAGNOSIS — G4700 Insomnia, unspecified: Secondary | ICD-10-CM | POA: Diagnosis not present

## 2021-04-07 DIAGNOSIS — E119 Type 2 diabetes mellitus without complications: Secondary | ICD-10-CM | POA: Diagnosis not present

## 2021-04-07 DIAGNOSIS — Z7984 Long term (current) use of oral hypoglycemic drugs: Secondary | ICD-10-CM | POA: Diagnosis not present

## 2021-04-07 DIAGNOSIS — Z90710 Acquired absence of both cervix and uterus: Secondary | ICD-10-CM | POA: Diagnosis not present

## 2021-04-07 DIAGNOSIS — I10 Essential (primary) hypertension: Secondary | ICD-10-CM | POA: Diagnosis not present

## 2021-04-07 DIAGNOSIS — Z471 Aftercare following joint replacement surgery: Secondary | ICD-10-CM | POA: Diagnosis not present

## 2021-04-07 DIAGNOSIS — F17210 Nicotine dependence, cigarettes, uncomplicated: Secondary | ICD-10-CM | POA: Diagnosis not present

## 2021-04-07 DIAGNOSIS — J45909 Unspecified asthma, uncomplicated: Secondary | ICD-10-CM | POA: Diagnosis not present

## 2021-04-07 DIAGNOSIS — Z96651 Presence of right artificial knee joint: Secondary | ICD-10-CM | POA: Diagnosis not present

## 2021-04-08 DIAGNOSIS — G4700 Insomnia, unspecified: Secondary | ICD-10-CM | POA: Diagnosis not present

## 2021-04-08 DIAGNOSIS — Z7984 Long term (current) use of oral hypoglycemic drugs: Secondary | ICD-10-CM | POA: Diagnosis not present

## 2021-04-08 DIAGNOSIS — Z471 Aftercare following joint replacement surgery: Secondary | ICD-10-CM | POA: Diagnosis not present

## 2021-04-08 DIAGNOSIS — Z9181 History of falling: Secondary | ICD-10-CM | POA: Diagnosis not present

## 2021-04-08 DIAGNOSIS — G709 Myoneural disorder, unspecified: Secondary | ICD-10-CM | POA: Diagnosis not present

## 2021-04-08 DIAGNOSIS — Z90710 Acquired absence of both cervix and uterus: Secondary | ICD-10-CM | POA: Diagnosis not present

## 2021-04-08 DIAGNOSIS — Z7982 Long term (current) use of aspirin: Secondary | ICD-10-CM | POA: Diagnosis not present

## 2021-04-08 DIAGNOSIS — M6530 Trigger finger, unspecified finger: Secondary | ICD-10-CM | POA: Diagnosis not present

## 2021-04-08 DIAGNOSIS — F17210 Nicotine dependence, cigarettes, uncomplicated: Secondary | ICD-10-CM | POA: Diagnosis not present

## 2021-04-08 DIAGNOSIS — E119 Type 2 diabetes mellitus without complications: Secondary | ICD-10-CM | POA: Diagnosis not present

## 2021-04-08 DIAGNOSIS — M5432 Sciatica, left side: Secondary | ICD-10-CM | POA: Diagnosis not present

## 2021-04-08 DIAGNOSIS — Z86010 Personal history of colonic polyps: Secondary | ICD-10-CM | POA: Diagnosis not present

## 2021-04-08 DIAGNOSIS — J45909 Unspecified asthma, uncomplicated: Secondary | ICD-10-CM | POA: Diagnosis not present

## 2021-04-08 DIAGNOSIS — Z96651 Presence of right artificial knee joint: Secondary | ICD-10-CM | POA: Diagnosis not present

## 2021-04-08 DIAGNOSIS — Z90722 Acquired absence of ovaries, bilateral: Secondary | ICD-10-CM | POA: Diagnosis not present

## 2021-04-08 DIAGNOSIS — I10 Essential (primary) hypertension: Secondary | ICD-10-CM | POA: Diagnosis not present

## 2021-04-11 DIAGNOSIS — Z471 Aftercare following joint replacement surgery: Secondary | ICD-10-CM | POA: Diagnosis not present

## 2021-04-11 DIAGNOSIS — F17210 Nicotine dependence, cigarettes, uncomplicated: Secondary | ICD-10-CM | POA: Diagnosis not present

## 2021-04-11 DIAGNOSIS — M5432 Sciatica, left side: Secondary | ICD-10-CM | POA: Diagnosis not present

## 2021-04-11 DIAGNOSIS — Z7982 Long term (current) use of aspirin: Secondary | ICD-10-CM | POA: Diagnosis not present

## 2021-04-11 DIAGNOSIS — J45909 Unspecified asthma, uncomplicated: Secondary | ICD-10-CM | POA: Diagnosis not present

## 2021-04-11 DIAGNOSIS — Z96651 Presence of right artificial knee joint: Secondary | ICD-10-CM | POA: Diagnosis not present

## 2021-04-11 DIAGNOSIS — G4700 Insomnia, unspecified: Secondary | ICD-10-CM | POA: Diagnosis not present

## 2021-04-11 DIAGNOSIS — Z90722 Acquired absence of ovaries, bilateral: Secondary | ICD-10-CM | POA: Diagnosis not present

## 2021-04-11 DIAGNOSIS — Z90710 Acquired absence of both cervix and uterus: Secondary | ICD-10-CM | POA: Diagnosis not present

## 2021-04-11 DIAGNOSIS — E119 Type 2 diabetes mellitus without complications: Secondary | ICD-10-CM | POA: Diagnosis not present

## 2021-04-11 DIAGNOSIS — G709 Myoneural disorder, unspecified: Secondary | ICD-10-CM | POA: Diagnosis not present

## 2021-04-11 DIAGNOSIS — Z86010 Personal history of colonic polyps: Secondary | ICD-10-CM | POA: Diagnosis not present

## 2021-04-11 DIAGNOSIS — Z7984 Long term (current) use of oral hypoglycemic drugs: Secondary | ICD-10-CM | POA: Diagnosis not present

## 2021-04-11 DIAGNOSIS — Z9181 History of falling: Secondary | ICD-10-CM | POA: Diagnosis not present

## 2021-04-11 DIAGNOSIS — M6530 Trigger finger, unspecified finger: Secondary | ICD-10-CM | POA: Diagnosis not present

## 2021-04-11 DIAGNOSIS — I10 Essential (primary) hypertension: Secondary | ICD-10-CM | POA: Diagnosis not present

## 2021-04-12 DIAGNOSIS — G4700 Insomnia, unspecified: Secondary | ICD-10-CM | POA: Diagnosis not present

## 2021-04-12 DIAGNOSIS — J45909 Unspecified asthma, uncomplicated: Secondary | ICD-10-CM | POA: Diagnosis not present

## 2021-04-12 DIAGNOSIS — Z90722 Acquired absence of ovaries, bilateral: Secondary | ICD-10-CM | POA: Diagnosis not present

## 2021-04-12 DIAGNOSIS — Z86010 Personal history of colonic polyps: Secondary | ICD-10-CM | POA: Diagnosis not present

## 2021-04-12 DIAGNOSIS — G709 Myoneural disorder, unspecified: Secondary | ICD-10-CM | POA: Diagnosis not present

## 2021-04-12 DIAGNOSIS — Z7982 Long term (current) use of aspirin: Secondary | ICD-10-CM | POA: Diagnosis not present

## 2021-04-12 DIAGNOSIS — Z7984 Long term (current) use of oral hypoglycemic drugs: Secondary | ICD-10-CM | POA: Diagnosis not present

## 2021-04-12 DIAGNOSIS — E119 Type 2 diabetes mellitus without complications: Secondary | ICD-10-CM | POA: Diagnosis not present

## 2021-04-12 DIAGNOSIS — Z90710 Acquired absence of both cervix and uterus: Secondary | ICD-10-CM | POA: Diagnosis not present

## 2021-04-12 DIAGNOSIS — M6530 Trigger finger, unspecified finger: Secondary | ICD-10-CM | POA: Diagnosis not present

## 2021-04-12 DIAGNOSIS — F17210 Nicotine dependence, cigarettes, uncomplicated: Secondary | ICD-10-CM | POA: Diagnosis not present

## 2021-04-12 DIAGNOSIS — Z96651 Presence of right artificial knee joint: Secondary | ICD-10-CM | POA: Diagnosis not present

## 2021-04-12 DIAGNOSIS — I10 Essential (primary) hypertension: Secondary | ICD-10-CM | POA: Diagnosis not present

## 2021-04-12 DIAGNOSIS — M5432 Sciatica, left side: Secondary | ICD-10-CM | POA: Diagnosis not present

## 2021-04-12 DIAGNOSIS — Z471 Aftercare following joint replacement surgery: Secondary | ICD-10-CM | POA: Diagnosis not present

## 2021-04-12 DIAGNOSIS — Z9181 History of falling: Secondary | ICD-10-CM | POA: Diagnosis not present

## 2021-04-13 ENCOUNTER — Ambulatory Visit (INDEPENDENT_AMBULATORY_CARE_PROVIDER_SITE_OTHER): Payer: BC Managed Care – PPO | Admitting: Orthopedic Surgery

## 2021-04-13 ENCOUNTER — Encounter: Payer: Self-pay | Admitting: Orthopedic Surgery

## 2021-04-13 ENCOUNTER — Other Ambulatory Visit: Payer: Self-pay

## 2021-04-13 DIAGNOSIS — G8918 Other acute postprocedural pain: Secondary | ICD-10-CM

## 2021-04-13 MED ORDER — TRAMADOL HCL 50 MG PO TABS: 50.0000 mg | ORAL_TABLET | Freq: Four times a day (QID) | ORAL | 0 refills | Status: AC | PRN

## 2021-04-13 MED ORDER — OXYCODONE-ACETAMINOPHEN 5-325 MG PO TABS: 1.0000 | ORAL_TABLET | ORAL | 0 refills | Status: AC | PRN

## 2021-04-13 MED ORDER — ASPIRIN 325 MG PO TBEC: 325.0000 mg | DELAYED_RELEASE_TABLET | Freq: Every day | ORAL | 0 refills | Status: DC

## 2021-04-13 NOTE — Progress Notes (Signed)
Chief Complaint  Patient presents with   Post-op Follow-up    Right TKA DOS 03/29/21   60 year old female postop visit #1 status post right total knee 2 weeks ago.  She did not get her aspirin she is taking tramadol and Percocet 10 mg for pain her range of motion is 1-102 by notes  Her wound looks good her staples were removed  We refilled her medication she can start outpatient physical therapy follow-up with me in 3 weeks  Meds ordered this encounter  Medications   aspirin 325 MG EC tablet    Sig: Take 1 tablet (325 mg total) by mouth daily with breakfast.    Dispense:  30 tablet    Refill:  0   traMADol (ULTRAM) 50 MG tablet    Sig: Take 1 tablet (50 mg total) by mouth every 6 (six) hours as needed for up to 5 days.    Dispense:  20 tablet    Refill:  0   oxyCODONE-acetaminophen (PERCOCET/ROXICET) 5-325 MG tablet    Sig: Take 1 tablet by mouth every 4 (four) hours as needed for up to 5 days for severe pain.    Dispense:  30 tablet    Refill:  0

## 2021-04-15 DIAGNOSIS — Z96651 Presence of right artificial knee joint: Secondary | ICD-10-CM | POA: Diagnosis not present

## 2021-04-15 DIAGNOSIS — M5432 Sciatica, left side: Secondary | ICD-10-CM | POA: Diagnosis not present

## 2021-04-15 DIAGNOSIS — J45909 Unspecified asthma, uncomplicated: Secondary | ICD-10-CM | POA: Diagnosis not present

## 2021-04-15 DIAGNOSIS — Z9181 History of falling: Secondary | ICD-10-CM | POA: Diagnosis not present

## 2021-04-15 DIAGNOSIS — Z7982 Long term (current) use of aspirin: Secondary | ICD-10-CM | POA: Diagnosis not present

## 2021-04-15 DIAGNOSIS — E119 Type 2 diabetes mellitus without complications: Secondary | ICD-10-CM | POA: Diagnosis not present

## 2021-04-15 DIAGNOSIS — Z7984 Long term (current) use of oral hypoglycemic drugs: Secondary | ICD-10-CM | POA: Diagnosis not present

## 2021-04-15 DIAGNOSIS — Z471 Aftercare following joint replacement surgery: Secondary | ICD-10-CM | POA: Diagnosis not present

## 2021-04-15 DIAGNOSIS — Z90710 Acquired absence of both cervix and uterus: Secondary | ICD-10-CM | POA: Diagnosis not present

## 2021-04-15 DIAGNOSIS — G4700 Insomnia, unspecified: Secondary | ICD-10-CM | POA: Diagnosis not present

## 2021-04-15 DIAGNOSIS — F17210 Nicotine dependence, cigarettes, uncomplicated: Secondary | ICD-10-CM | POA: Diagnosis not present

## 2021-04-15 DIAGNOSIS — G709 Myoneural disorder, unspecified: Secondary | ICD-10-CM | POA: Diagnosis not present

## 2021-04-15 DIAGNOSIS — Z90722 Acquired absence of ovaries, bilateral: Secondary | ICD-10-CM | POA: Diagnosis not present

## 2021-04-15 DIAGNOSIS — Z86010 Personal history of colonic polyps: Secondary | ICD-10-CM | POA: Diagnosis not present

## 2021-04-15 DIAGNOSIS — M6530 Trigger finger, unspecified finger: Secondary | ICD-10-CM | POA: Diagnosis not present

## 2021-04-15 DIAGNOSIS — I10 Essential (primary) hypertension: Secondary | ICD-10-CM | POA: Diagnosis not present

## 2021-04-18 ENCOUNTER — Ambulatory Visit (HOSPITAL_COMMUNITY): Payer: BC Managed Care – PPO | Attending: Orthopedic Surgery

## 2021-04-18 ENCOUNTER — Other Ambulatory Visit: Payer: Self-pay | Admitting: Orthopedic Surgery

## 2021-04-18 ENCOUNTER — Other Ambulatory Visit: Payer: Self-pay

## 2021-04-18 DIAGNOSIS — R262 Difficulty in walking, not elsewhere classified: Secondary | ICD-10-CM | POA: Insufficient documentation

## 2021-04-18 DIAGNOSIS — R29898 Other symptoms and signs involving the musculoskeletal system: Secondary | ICD-10-CM | POA: Diagnosis not present

## 2021-04-18 DIAGNOSIS — M6281 Muscle weakness (generalized): Secondary | ICD-10-CM | POA: Insufficient documentation

## 2021-04-18 MED ORDER — METHOCARBAMOL 500 MG PO TABS: 500.0000 mg | ORAL_TABLET | Freq: Four times a day (QID) | ORAL | 1 refills | Status: DC | PRN

## 2021-04-18 NOTE — Telephone Encounter (Signed)
Patient called wants a refill on her muscle relaxer   methocarbamol (ROBAXIN) 500 MG tablet  Pharmacy:  [facility]

## 2021-04-18 NOTE — Therapy (Signed)
[facility] [address] [place], [place], [address] Phone: [phone]   Fax:  [phone]  Physical Therapy Evaluation  Patient Details  Name: Robin Sherman MRN: [id number] Date of Birth: [birth year] Referring Provider (PT): Arther Abbott   Encounter Date: 04/18/2021   PT End of Session - 04/18/21 0915     Visit Number 1    Number of Visits 18    Date for PT Re-Evaluation 05/30/21    Authorization Type [employer] PPO    Authorization - Visit Number 1    Authorization - Number of Visits 60    Progress Note Due on Visit 10    PT Start Time 0900    PT Stop Time 0945    PT Time Calculation (min) 45 min    Activity Tolerance Patient tolerated treatment well    Behavior During Therapy [facility] for tasks assessed/performed             Past Medical History:  Diagnosis Date   Allergy    pollen   Arthritis    left knee   Asthma    Eczema    Followed by Dr. Nevada Crane dermatology   Headache(784.0) 09/25/2012   Hypertension    Neuromuscular disorder ([place])    Pre-diabetes    Sciatica of left side 10/13/2013   TRIGGER FINGER 11/03/2008   Qualifier: Diagnosis of  By: Aline Brochure MD, Dorothyann Peng      Past Surgical History:  Procedure Laterality Date   ABDOMINAL HYSTERECTOMY     bleeding   BILATERAL SALPINGECTOMY Bilateral 08/01/2013   Procedure: BILATERAL SALPINGECTOMY;  Surgeon: Jonnie Kind, MD;  [facility];  Service: Gynecology;  Laterality: Bilateral;   CHONDROPLASTY Left 01/14/2014   Procedure: CHONDROPLASTY OF FEMUR;  Surgeon: Carole Civil, MD;  [facility];  Service: Orthopedics;  Laterality: Left;   CHONDROPLASTY Right 12/12/2018   Procedure: CHONDROPLASTY;  Surgeon: Carole Civil, MD;  [facility];  Service: Orthopedics;  Laterality: Right;   COLONOSCOPY N/A 01/27/2013   Dr. Oneida Alar: Right colon with poor prep (patient ate [place] necks the night before the procedure).  Moderate diverticulosis in the sigmoid  colon, moderate hemorrhoids.   COLONOSCOPY WITH PROPOFOL N/A 04/06/2020   Procedure: COLONOSCOPY WITH PROPOFOL;  Surgeon: Eloise Harman, DO;  [facility];  Service: Endoscopy;  Laterality: N/A;  8:15am   HEMATOMA EVACUATION N/A 08/03/2013   Procedure: EVACUATION PELVIC HEMATOMA;  Surgeon: Jonnie Kind, MD;  [facility];  Service: Gynecology;  Laterality: N/A;   KNEE ARTHROSCOPY WITH MEDIAL MENISECTOMY Left 01/14/2014   Procedure: KNEE ARTHROSCOPY WITH PARTIAL MEDIAL MENISECTOMY;  Surgeon: Carole Civil, MD;  [facility];  Service: Orthopedics;  Laterality: Left;   KNEE ARTHROSCOPY WITH MEDIAL MENISECTOMY Right 12/12/2018   Procedure: KNEE ARTHROSCOPY WITH MEDIAL MENISCECTOMY;  Surgeon: Carole Civil, MD;  [facility];  Service: Orthopedics;  Laterality: Right;   POLYPECTOMY  04/06/2020   Procedure: POLYPECTOMY;  Surgeon: Eloise Harman, DO;  [facility];  Service: Endoscopy;;   SUPRACERVICAL ABDOMINAL HYSTERECTOMY N/A 08/01/2013   Procedure: HYSTERECTOMY SUPRACERVICAL ABDOMINAL;  Surgeon: Jonnie Kind, MD;  [facility];  Service: Gynecology;  Laterality: N/A;   TOTAL KNEE ARTHROPLASTY Right 03/29/2021   Procedure: TOTAL KNEE ARTHROPLASTY;  Surgeon: Carole Civil, MD;  [facility];  Service: Orthopedics;  Laterality: Right;   TRANSFORAMINAL LUMBAR INTERBODY FUSION W/ MIS 1 LEVEL Right 06/14/2020   Procedure: Right  Lumbar Five- Sacral One Minimally invasive transforaminal lumbar interbody fusion;  Surgeon: Judith Part, MD;  Location: [place];  Service: Neurosurgery;  Laterality: Right;  Right Lumbar Five- Sacral One Minimally invasive transforaminal lumbar interbody fusion   [place]    There were no vitals filed for this visit.    Subjective Assessment - 04/18/21 0914     Subjective right TKR on 03/29/21 and rpesents with pain/stiffness in right knee some home health    Currently in Pain?  Yes    Pain Score 4     Pain Location Knee    Pain Orientation Right    Pain Descriptors / Indicators Aching                [facility] PT Assessment - 04/18/21 0001       Assessment   Medical Diagnosis Right TKR    Referring Provider (PT) Arther Abbott    Onset Date/Surgical Date 03/29/21    Next MD Visit end of October      Balance Screen   Has the patient fallen in the past 6 months No    Has the patient had a decrease in activity level because of a fear of falling?  No    Is the patient reluctant to leave their home because of a fear of falling?  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   AROM / PROM / Strength AROM;Strength      AROM   AROM Assessment Site Knee    Right/Left Knee Right    Right Knee Extension 8   lacking   Right Knee Flexion 100      Strength   Strength Assessment Site Knee    Right/Left Knee Right    Right Knee Flexion 3/5    Right Knee Extension 3/5      Bed Mobility   Bed Mobility Supine to Sit;Sit to Supine    Supine to Sit Independent    Sit to Supine Independent      Transfers   Transfers Sit to Stand    Sit to Stand 6: Modified independent (Device/Increase time)    Five time sit to stand comments  22 sec      Ambulation/Gait   Ambulation/Gait Yes    Ambulation/Gait Assistance 6: Modified independent (Device/Increase time)    Ambulation Distance (Feet) 150 Feet    Assistive device Straight cane    Gait Pattern Step-to pattern;Decreased stance time - right;Antalgic    Ambulation  Surface Level;Indoor    Stairs Yes    Stairs Assistance 6: Modified independent (Device/Increase time)    Stair Management Technique One rail Left;Step to pattern    Gait Comments 2MWT                        Objective measurements completed on examination: See above findings.  [facility] Adult PT Treatment/Exercise - 04/18/21 0001       Exercises   Exercises Knee/Hip      Knee/Hip Exercises: Supine   Quad Sets Strengthening;Right;2 sets;10 reps    Heel Slides AAROM;Right;2 sets;10 reps    Terminal Knee Extension AAROM;Right    Terminal Knee Extension Limitations static stretch                       PT Short Term Goals - 04/18/21 0948       PT SHORT TERM GOAL #1   Title Pt will demo consistency and independence with her HEP to improve strength and mobility.    Time 3    Period Weeks    Status New    Target Date 05/09/21      PT SHORT TERM GOAL #2   Title Demonstrate full right knee extension and 110 flexion to improve transfer and gait kinematics    Baseline lacking 8 degrees extension, 100 degrees flexoin    Time 3    Period Weeks    Status New    Target Date 05/09/21      PT SHORT TERM GOAL #3   Title Demo improved LE strength and reduced risk for falls as evident by time of 12 sec 5xSTS    Baseline 22 sec    Time 3    Period Weeks    Status New    Target Date 05/09/21               PT Long Term Goals - 04/18/21 0949       PT LONG TERM GOAL #1   Title Demo normalized, independent gait pattern x 300 ft during 2MWT    Baseline 150 ft with cane and antalgic pattern    Time 6    Period Weeks    Status New    Target Date 05/30/21      PT LONG TERM GOAL #2   Title Demo independent, reciprocal stair ambulation to improve indepedence and safety    Baseline step-to with HR and cane    Time 6    Period Weeks    Status New    Target Date 05/30/21      PT LONG TERM GOAL #3   Title Demo 5/5 RLE strength to improve  stability and activity tolerance for work duties    Baseline 3/5 right knee    Time 6    Period Weeks    Status New    Target Date 05/30/21                    Plan - 04/18/21 0946     Clinical Impression Statement 60 yo lady s/p Right TKR and presents with limitations in RLE ROM, strength, balance causing gait deviations and reduced functional activity tolerance.  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    Personal Factors and Comorbidities Time since onset of injury/illness/exacerbation    Examination-Activity Limitations Bend;Lift;Stand;Stairs;Squat;Locomotion Level;Transfers    Examination-Participation Restrictions Cleaning;Community Activity;Driving;Yard Work;Occupation    Stability/Clinical Decision Making Stable/Uncomplicated    Clinical Decision Making Low    Rehab Potential Excellent    PT Frequency 3x / week    PT Duration 6 weeks    PT Treatment/Interventions ADLs/Self Care Home Management;DME Instruction;Gait training;Stair  training;Functional mobility training;Therapeutic activities;Therapeutic exercise;Balance training;Patient/family education;Neuromuscular re-education;Manual techniques;Passive range of motion    PT Next Visit Plan continue with TKR protocol    PT Home Exercise Plan QS, heel slide, knee extension prop    Consulted and Agree with Plan of Care Patient             Patient will benefit from skilled therapeutic intervention in order to improve the following deficits and impairments:  Abnormal gait, Decreased balance, Decreased mobility, Decreased range of motion, Decreased strength, Improper body mechanics, Pain  Visit Diagnosis: Difficulty in walking, not elsewhere classified  Muscle weakness (generalized)  Other symptoms and signs involving the  musculoskeletal system     Problem List Patient Active Problem List   Diagnosis Date Noted   S/P TKR (total knee replacement), right 03/29/2021   Isthmic spondylolisthesis 06/14/2020   Polypharmacy 02/09/2020   Encounter for screening colonoscopy 02/09/2020   DDD (degenerative disc disease), lumbar 12/08/2019   S/P right knee arthroscopy 12/12/18 *with chondroplasty patella  12/19/2018   Borderline diabetes 07/02/2015   Vitamin D deficiency 07/02/2015   Lumbar back pain 07/02/2015   Insomnia 10/29/2013   Arthritis of knee, degenerative 10/13/2013   Hand dermatitis 03/02/2012   Essential hypertension, benign 09/21/2011   Class 2 obesity 09/21/2011   Shoulder pain, right 09/21/2011   Asthma, intermittent 09/21/2011   Tobacco user 09/21/2011    Toniann Fail, PT 04/18/2021, 10:03 AM  [facility] [place], [place], [address] Phone: [phone]   Fax:  [phone]  Name: Robin Sherman MRN: [id number] Date of Birth: 09/29/1960

## 2021-04-20 ENCOUNTER — Other Ambulatory Visit: Payer: Self-pay

## 2021-04-20 ENCOUNTER — Ambulatory Visit (HOSPITAL_COMMUNITY): Payer: BC Managed Care – PPO

## 2021-04-20 DIAGNOSIS — R262 Difficulty in walking, not elsewhere classified: Secondary | ICD-10-CM | POA: Diagnosis not present

## 2021-04-20 DIAGNOSIS — M6281 Muscle weakness (generalized): Secondary | ICD-10-CM

## 2021-04-20 DIAGNOSIS — R29898 Other symptoms and signs involving the musculoskeletal system: Secondary | ICD-10-CM | POA: Diagnosis not present

## 2021-04-20 NOTE — Therapy (Signed)
[facility] [address] [place], [place], [address] Phone: [phone]   Fax:  [phone]  Physical Therapy Treatment  Patient Details  Name: Robin Sherman MRN: [id number] Date of Birth: 11-17-1960 Referring Provider (PT): Arther Abbott   Encounter Date: 04/20/2021   PT End of Session - 04/20/21 0905     Visit Number 2    Number of Visits 18    Date for PT Re-Evaluation 05/30/21    Authorization Type [employer] Comm PPO    Authorization - Visit Number 2    Authorization - Number of Visits 60    Progress Note Due on Visit 10    PT Start Time 0900    PT Stop Time 0945    PT Time Calculation (min) 45 min    Activity Tolerance Patient tolerated treatment well    Behavior During Therapy [facility] for tasks assessed/performed             Past Medical History:  Diagnosis Date   Allergy    pollen   Arthritis    left knee   Asthma    Eczema    Followed by Dr. [place] Crane dermatology   Headache(784.0) 09/25/2012   Hypertension    Neuromuscular disorder ([place])    Pre-diabetes    Sciatica of left side 10/13/2013   TRIGGER FINGER 11/03/2008   Qualifier: Diagnosis of  By: Aline Brochure MD, Dorothyann Peng      Past Surgical History:  Procedure Laterality Date   ABDOMINAL HYSTERECTOMY     bleeding   BILATERAL SALPINGECTOMY Bilateral 08/01/2013   Procedure: BILATERAL SALPINGECTOMY;  Surgeon: Jonnie Kind, MD;  [facility];  Service: Gynecology;  Laterality: Bilateral;   CHONDROPLASTY Left 01/14/2014   Procedure: CHONDROPLASTY OF FEMUR;  Surgeon: Carole Civil, MD;  [facility];  Service: Orthopedics;  Laterality: Left;   CHONDROPLASTY Right 12/12/2018   Procedure: CHONDROPLASTY;  Surgeon: Carole Civil, MD;  [facility];  Service: Orthopedics;  Laterality: Right;   COLONOSCOPY N/A 01/27/2013   Dr. Oneida Alar: Right colon with poor prep (patient ate [place] necks the night before the procedure).  Moderate diverticulosis in the sigmoid  colon, moderate hemorrhoids.   COLONOSCOPY WITH PROPOFOL N/A 04/06/2020   Procedure: COLONOSCOPY WITH PROPOFOL;  Surgeon: Eloise Harman, DO;  [facility];  Service: Endoscopy;  Laterality: N/A;  8:15am   HEMATOMA EVACUATION N/A 08/03/2013   Procedure: EVACUATION PELVIC HEMATOMA;  Surgeon: Jonnie Kind, MD;  [facility];  Service: Gynecology;  Laterality: N/A;   KNEE ARTHROSCOPY WITH MEDIAL MENISECTOMY Left 01/14/2014   Procedure: KNEE ARTHROSCOPY WITH PARTIAL MEDIAL MENISECTOMY;  Surgeon: Carole Civil, MD;  [facility];  Service: Orthopedics;  Laterality: Left;   KNEE ARTHROSCOPY WITH MEDIAL MENISECTOMY Right 12/12/2018   Procedure: KNEE ARTHROSCOPY WITH MEDIAL MENISCECTOMY;  Surgeon: Carole Civil, MD;  [facility];  Service: Orthopedics;  Laterality: Right;   POLYPECTOMY  04/06/2020   Procedure: POLYPECTOMY;  Surgeon: Eloise Harman, DO;  [facility];  Service: Endoscopy;;   SUPRACERVICAL ABDOMINAL HYSTERECTOMY N/A 08/01/2013   Procedure: HYSTERECTOMY SUPRACERVICAL ABDOMINAL;  Surgeon: Jonnie Kind, MD;  [facility];  Service: Gynecology;  Laterality: N/A;   TOTAL KNEE ARTHROPLASTY Right 03/29/2021   Procedure: TOTAL KNEE ARTHROPLASTY;  Surgeon: Carole Civil, MD;  [facility];  Service: Orthopedics;  Laterality: Right;   TRANSFORAMINAL LUMBAR INTERBODY FUSION W/ MIS 1 LEVEL Right 06/14/2020   Procedure: Right  Lumbar Five- Sacral One Minimally invasive transforaminal lumbar interbody fusion;  Surgeon: Judith Part, MD;  Location: [facility];  Service: Neurosurgery;  Laterality: Right;  Right Lumbar Five- Sacral One Minimally invasive transforaminal lumbar interbody fusion   [place]    There were no vitals filed for this visit.   Subjective Assessment - 04/20/21 0904     Subjective Not feeling too bad, right knee is still sore    Currently in Pain? Yes    Pain Score 3     Pain Location  Knee    Pain Orientation Right    Pain Descriptors / Indicators Aching    Pain Type Surgical pain                [facility] PT Assessment - 04/20/21 0001       Assessment   Medical Diagnosis Right TKR    Referring Provider (PT) Arther Abbott    Onset Date/Surgical Date 03/29/21    Next MD Visit end of October                           [facility] Adult PT Treatment/Exercise - 04/20/21 0001       Knee/Hip Exercises: Aerobic   Recumbent Bike use of bike for AAROM x 4 min      Knee/Hip Exercises: Standing   Heel Raises Both;2 sets;10 reps    Gait Training forward/retro walk in // bars x 2 min. Sidestepping x 2 min // bars    Other Standing Knee Exercises static standing airex pad 3x10 sec eyes open/closed      Knee/Hip Exercises: Seated   Heel Slides AROM;Right;3 sets;10 reps      Knee/Hip Exercises: Supine   Quad Sets Strengthening;Right;3 sets;10 reps    Heel Slides AAROM;Right;3 sets;10 reps    Heel Prop for Knee Extension 1 minute    Straight Leg Raises Strengthening;Right;3 sets;5 reps    Patellar Mobs 4-way mob 1x10 all directions    Knee Extension AROM;Right    Knee Extension Limitations 8   lacking   Knee Flexion AROM;Right    Knee Flexion Limitations 100                       PT Short Term Goals - 04/18/21 0948       PT SHORT TERM GOAL #1   Title Pt will demo consistency and independence with her HEP to improve strength and mobility.    Time 3    Period Weeks    Status New    Target Date 05/09/21      PT SHORT TERM GOAL #2   Title Demonstrate full right knee extension and 110 flexion to improve transfer and gait kinematics    Baseline lacking 8 degrees extension, 100 degrees flexoin    Time 3    Period Weeks    Status New    Target Date 05/09/21      PT SHORT TERM GOAL #3   Title Demo improved LE strength and reduced risk for falls as evident by time of 12 sec 5xSTS    Baseline 22 sec    Time 3    Period Weeks     Status New    Target Date 05/09/21               PT Long Term Goals - 04/18/21 2671  PT LONG TERM GOAL #1   Title Demo normalized, independent gait pattern x 300 ft during 2MWT    Baseline 150 ft with cane and antalgic pattern    Time 6    Period Weeks    Status New    Target Date 05/30/21      PT LONG TERM GOAL #2   Title Demo independent, reciprocal stair ambulation to improve indepedence and safety    Baseline step-to with HR and cane    Time 6    Period Weeks    Status New    Target Date 05/30/21      PT LONG TERM GOAL #3   Title Demo 5/5 RLE strength to improve stability and activity tolerance for work duties    Baseline 3/5 right knee    Time 6    Period Weeks    Status New    Target Date 05/30/21                   Plan - 04/20/21 0940     Clinical Impression Statement Tolerating tx sessions very well and demonstrating knee extension right side lacking 8 degrees terminal knee extension and 102 flexion without much pain or difficulty.  Ambulates with pronounced right lateral shift in stance phase. Pt notes some instances of sciatica pain running down her right leg and notes she has had lumbar fusion and sciatica issues in the past. Continued sessions indicated to progress RLE ROM/strength and normalize gait pattern    Personal Factors and Comorbidities Time since onset of injury/illness/exacerbation    Examination-Activity Limitations Bend;Lift;Stand;Stairs;Squat;Locomotion Level;Transfers    Examination-Participation Restrictions Cleaning;Community Activity;Driving;Yard Work;Occupation    Stability/Clinical Decision Making Stable/Uncomplicated    Rehab Potential Excellent    PT Frequency 3x / week    PT Duration 6 weeks    PT Treatment/Interventions ADLs/Self Care Home Management;DME Instruction;Gait training;Stair training;Functional mobility training;Therapeutic activities;Therapeutic exercise;Balance training;Patient/family  education;Neuromuscular re-education;Manual techniques;Passive range of motion    PT Next Visit Plan continue with TKR protocol    PT Home Exercise Plan QS, heel slide, knee extension prop    Consulted and Agree with Plan of Care Patient             Patient will benefit from skilled therapeutic intervention in order to improve the following deficits and impairments:  Abnormal gait, Decreased balance, Decreased mobility, Decreased range of motion, Decreased strength, Improper body mechanics, Pain  Visit Diagnosis: Difficulty in walking, not elsewhere classified  Muscle weakness (generalized)  Other symptoms and signs involving the musculoskeletal system     Problem List Patient Active Problem List   Diagnosis Date Noted   S/P TKR (total knee replacement), right 03/29/2021   Isthmic spondylolisthesis 06/14/2020   Polypharmacy 02/09/2020   Encounter for screening colonoscopy 02/09/2020   DDD (degenerative disc disease), lumbar 12/08/2019   S/P right knee arthroscopy 12/12/18 *with chondroplasty patella  12/19/2018   Borderline diabetes 07/02/2015   Vitamin D deficiency 07/02/2015   Lumbar back pain 07/02/2015   Insomnia 10/29/2013   Arthritis of knee, degenerative 10/13/2013   Hand dermatitis 03/02/2012   Essential hypertension, benign 09/21/2011   Class 2 obesity 09/21/2011   Shoulder pain, right 09/21/2011   Asthma, intermittent 09/21/2011   Tobacco user 09/21/2011    9:43 AM, 04/20/21 M. Sherlyn Lees, PT, DPT Physical Therapist- [place] Office Number: [phone]   [facility] [address] [place], [place], [address] Phone: [phone]   Fax:  [phone]  Name: Robin Sherman MRN: [id number] Date of Birth: February 09, 1961

## 2021-04-21 ENCOUNTER — Telehealth: Payer: Self-pay | Admitting: Radiology

## 2021-04-21 NOTE — Telephone Encounter (Signed)
Patient called, asked for a medication that Damonte Frieson help with her nerve pain in her legs, she cannot sleep at night.  Pain meds do not help with that pain, as far as sleep.    [facility].

## 2021-04-22 ENCOUNTER — Ambulatory Visit (HOSPITAL_COMMUNITY): Payer: BC Managed Care – PPO

## 2021-04-22 ENCOUNTER — Other Ambulatory Visit: Payer: Self-pay

## 2021-04-22 ENCOUNTER — Encounter: Payer: Self-pay | Admitting: Orthopedic Surgery

## 2021-04-22 DIAGNOSIS — M6281 Muscle weakness (generalized): Secondary | ICD-10-CM

## 2021-04-22 DIAGNOSIS — R262 Difficulty in walking, not elsewhere classified: Secondary | ICD-10-CM

## 2021-04-22 DIAGNOSIS — R29898 Other symptoms and signs involving the musculoskeletal system: Secondary | ICD-10-CM

## 2021-04-22 NOTE — Therapy (Signed)
[facility] [address] [place], [place], [address] Phone: [phone]   Fax:  [phone]  Physical Therapy Treatment  Patient Details  Name: Robin Sherman MRN: [id number] Date of Birth: 30-Oct-1960 Referring Provider (PT): Arther Abbott   Encounter Date: 04/22/2021   PT End of Session - 04/22/21 0913     Visit Number 3    Number of Visits 18    Date for PT Re-Evaluation 05/30/21    Authorization Type [employer] PPO    Authorization - Visit Number 3    Authorization - Number of Visits 60    Progress Note Due on Visit 10    PT Start Time 0905    PT Stop Time 0945    PT Time Calculation (min) 40 min    Activity Tolerance Patient tolerated treatment well    Behavior During Therapy [facility] for tasks assessed/performed             Past Medical History:  Diagnosis Date   Allergy    pollen   Arthritis    left knee   Asthma    Eczema    Followed by Dr. [place] Crane dermatology   Headache(784.0) 09/25/2012   Hypertension    Neuromuscular disorder ([place])    Pre-diabetes    Sciatica of left side 10/13/2013   TRIGGER FINGER 11/03/2008   Qualifier: Diagnosis of  By: Aline Brochure MD, Dorothyann Peng      Past Surgical History:  Procedure Laterality Date   ABDOMINAL HYSTERECTOMY     bleeding   BILATERAL SALPINGECTOMY Bilateral 08/01/2013   Procedure: BILATERAL SALPINGECTOMY;  Surgeon: Jonnie Kind, MD;  [facility]: [facility];  Service: Gynecology;  Laterality: Bilateral;   CHONDROPLASTY Left 01/14/2014   Procedure: CHONDROPLASTY OF FEMUR;  Surgeon: Carole Civil, MD;  Location: [facility];  Service: Orthopedics;  Laterality: Left;   CHONDROPLASTY Right 12/12/2018   Procedure: CHONDROPLASTY;  Surgeon: Carole Civil, MD;  [facility]: [facility];  Service: Orthopedics;  Laterality: Right;   COLONOSCOPY N/A 01/27/2013   Dr. Oneida Alar: Right colon with poor prep (patient ate [place] necks the night before the procedure).  Moderate diverticulosis in the sigmoid  colon, moderate hemorrhoids.   COLONOSCOPY WITH PROPOFOL N/A 04/06/2020   Procedure: COLONOSCOPY WITH PROPOFOL;  Surgeon: Eloise Harman, DO;  [facility];  Service: Endoscopy;  Laterality: N/A;  8:15am   HEMATOMA EVACUATION N/A 08/03/2013   Procedure: EVACUATION PELVIC HEMATOMA;  Surgeon: Jonnie Kind, MD;  [facility]: [facility];  Service: Gynecology;  Laterality: N/A;   KNEE ARTHROSCOPY WITH MEDIAL MENISECTOMY Left 01/14/2014   Procedure: KNEE ARTHROSCOPY WITH PARTIAL MEDIAL MENISECTOMY;  Surgeon: Carole Civil, MD;  [facility]: [facility];  Service: Orthopedics;  Laterality: Left;   KNEE ARTHROSCOPY WITH MEDIAL MENISECTOMY Right 12/12/2018   Procedure: KNEE ARTHROSCOPY WITH MEDIAL MENISCECTOMY;  Surgeon: Carole Civil, MD;  [facility];  Service: Orthopedics;  Laterality: Right;   POLYPECTOMY  04/06/2020   Procedure: POLYPECTOMY;  Surgeon: Eloise Harman, DO;  [facility];  Service: Endoscopy;;   SUPRACERVICAL ABDOMINAL HYSTERECTOMY N/A 08/01/2013   Procedure: HYSTERECTOMY SUPRACERVICAL ABDOMINAL;  Surgeon: Jonnie Kind, MD;  [facility];  Service: Gynecology;  Laterality: N/A;   TOTAL KNEE ARTHROPLASTY Right 03/29/2021   Procedure: TOTAL KNEE ARTHROPLASTY;  Surgeon: Carole Civil, MD;  [facility]: [facility];  Service: Orthopedics;  Laterality: Right;   TRANSFORAMINAL LUMBAR INTERBODY FUSION W/ MIS 1 LEVEL Right 06/14/2020   Procedure: Right  Lumbar Five- Sacral One Minimally invasive transforaminal lumbar interbody fusion;  Surgeon: Judith Part, MD;  Location: [place];  Service: Neurosurgery;  Laterality: Right;  Right Lumbar Five- Sacral One Minimally invasive transforaminal lumbar interbody fusion   [place]    There were no vitals filed for this visit.   Subjective Assessment - 04/22/21 0941     Subjective Been working on the exercises and some outdoor walking    Currently in Pain? Yes    Pain Score 3     Pain  Location Knee    Pain Orientation Right    Pain Descriptors / Indicators Aching    Pain Type Surgical pain                [facility] PT Assessment - 04/22/21 0001       Assessment   Medical Diagnosis Right TKR    Referring Provider (PT) Arther Abbott    Onset Date/Surgical Date 03/29/21                           [facility] Adult PT Treatment/Exercise - 04/22/21 0001       Knee/Hip Exercises: Aerobic   Recumbent Bike use of bike for AAROM x 4 min      Knee/Hip Exercises: Machines for Strengthening   Cybex Knee Flexion 4 plates 2x10      Knee/Hip Exercises: Supine   Quad Sets Strengthening;Right;3 sets;10 reps    Heel Slides AROM;Right;2 sets;10 reps    Straight Leg Raises Strengthening;Right;3 sets;10 reps    Knee Extension AROM;Right    Knee Extension Limitations lacking    Knee Flexion AROM;Right    Knee Flexion Limitations 104      Knee/Hip Exercises: Prone   Hamstring Curl 2 sets;10 reps                       PT Short Term Goals - 04/18/21 0948       PT SHORT TERM GOAL #1   Title Pt will demo consistency and independence with her HEP to improve strength and mobility.    Time 3    Period Weeks    Status New    Target Date 05/09/21      PT SHORT TERM GOAL #2   Title Demonstrate full right knee extension and 110 flexion to improve transfer and gait kinematics    Baseline lacking 8 degrees extension, 100 degrees flexoin    Time 3    Period Weeks    Status New    Target Date 05/09/21      PT SHORT TERM GOAL #3   Title Demo improved LE strength and reduced risk for falls as evident by time of 12 sec 5xSTS    Baseline 22 sec    Time 3    Period Weeks    Status New    Target Date 05/09/21               PT Long Term Goals - 04/18/21 0949       PT LONG TERM GOAL #1   Title Demo normalized, independent gait pattern x 300 ft during 2MWT    Baseline 150 ft with cane and antalgic pattern    Time 6    Period Weeks     Status New    Target Date 05/30/21      PT LONG TERM GOAL #2  Title Demo independent, reciprocal stair ambulation to improve indepedence and safety    Baseline step-to with HR and cane    Time 6    Period Weeks    Status New    Target Date 05/30/21      PT LONG TERM GOAL #3   Title Demo 5/5 RLE strength to improve stability and activity tolerance for work duties    Baseline 3/5 right knee    Time 6    Period Weeks    Status New    Target Date 05/30/21                   Plan - 04/22/21 0942     Clinical Impression Statement Ambulating without AD today but continues to demo right lateral lean in stance. Advised to continue with ambulation using AD until limb goes away. Progressing with ROM and strengthening exercises. Continued POC indicated to improve right knee ROM, strength, and normalize gait pattern    Personal Factors and Comorbidities Time since onset of injury/illness/exacerbation    Examination-Activity Limitations Bend;Lift;Stand;Stairs;Squat;Locomotion Level;Transfers    Examination-Participation Restrictions Cleaning;Community Activity;Driving;Yard Work;Occupation    Stability/Clinical Decision Making Stable/Uncomplicated    Rehab Potential Excellent    PT Frequency 3x / week    PT Duration 6 weeks    PT Treatment/Interventions ADLs/Self Care Home Management;DME Instruction;Gait training;Stair training;Functional mobility training;Therapeutic activities;Therapeutic exercise;Balance training;Patient/family education;Neuromuscular re-education;Manual techniques;Passive range of motion    PT Next Visit Plan continue with TKR protocol    PT Home Exercise Plan QS, heel slide, knee extension prop    Consulted and Agree with Plan of Care Patient             Patient will benefit from skilled therapeutic intervention in order to improve the following deficits and impairments:  Abnormal gait, Decreased balance, Decreased mobility, Decreased range of motion,  Decreased strength, Improper body mechanics, Pain  Visit Diagnosis: Difficulty in walking, not elsewhere classified  Muscle weakness (generalized)  Other symptoms and signs involving the musculoskeletal system     Problem List Patient Active Problem List   Diagnosis Date Noted   S/P TKR (total knee replacement), right 03/29/2021   Isthmic spondylolisthesis 06/14/2020   Polypharmacy 02/09/2020   Encounter for screening colonoscopy 02/09/2020   DDD (degenerative disc disease), lumbar 12/08/2019   S/P right knee arthroscopy 12/12/18 *with chondroplasty patella  12/19/2018   Borderline diabetes 07/02/2015   Vitamin D deficiency 07/02/2015   Lumbar back pain 07/02/2015   Insomnia 10/29/2013   Arthritis of knee, degenerative 10/13/2013   Hand dermatitis 03/02/2012   Essential hypertension, benign 09/21/2011   Class 2 obesity 09/21/2011   Shoulder pain, right 09/21/2011   Asthma, intermittent 09/21/2011   Tobacco user 09/21/2011    Toniann Fail, PT 04/22/2021, 9:44 AM  [facility] [place], [place], [address] Phone: [phone]   Fax:  [phone]  Name: Verbena Boeding MRN: [id number] Date of Birth: 24-Oct-1960

## 2021-04-25 ENCOUNTER — Ambulatory Visit (HOSPITAL_COMMUNITY): Payer: BC Managed Care – PPO

## 2021-04-25 ENCOUNTER — Other Ambulatory Visit: Payer: Self-pay

## 2021-04-25 DIAGNOSIS — R29898 Other symptoms and signs involving the musculoskeletal system: Secondary | ICD-10-CM

## 2021-04-25 DIAGNOSIS — M6281 Muscle weakness (generalized): Secondary | ICD-10-CM | POA: Diagnosis not present

## 2021-04-25 DIAGNOSIS — R262 Difficulty in walking, not elsewhere classified: Secondary | ICD-10-CM

## 2021-04-25 NOTE — Therapy (Signed)
[facility] [address] [place], [place], [address] Phone: [phone]   Fax:  [phone]  Physical Therapy Treatment  Patient Details  Name: Robin Sherman MRN: [id number] Date of Birth: 1961/07/17 Referring Provider (PT): Arther Abbott   Encounter Date: 04/25/2021   PT End of Session - 04/25/21 0906     Visit Number 4    Number of Visits 18    Date for PT Re-Evaluation 05/30/21    Authorization Type [employer] PPO    Authorization - Visit Number 4    Authorization - Number of Visits 60    Progress Note Due on Visit 10    PT Start Time 0900    PT Stop Time 0945    PT Time Calculation (min) 45 min    Activity Tolerance Patient tolerated treatment well    Behavior During Therapy [facility] for tasks assessed/performed             Past Medical History:  Diagnosis Date   Allergy    pollen   Arthritis    left knee   Asthma    Eczema    Followed by Dr. [place] Crane dermatology   Headache(784.0) 09/25/2012   Hypertension    Neuromuscular disorder ([place])    Pre-diabetes    Sciatica of left side 10/13/2013   TRIGGER FINGER 11/03/2008   Qualifier: Diagnosis of  By: Aline Brochure MD, Dorothyann Peng      Past Surgical History:  Procedure Laterality Date   ABDOMINAL HYSTERECTOMY     bleeding   BILATERAL SALPINGECTOMY Bilateral 08/01/2013   Procedure: BILATERAL SALPINGECTOMY;  Surgeon: Jonnie Kind, MD;  [facility];  Service: Gynecology;  Laterality: Bilateral;   CHONDROPLASTY Left 01/14/2014   Procedure: CHONDROPLASTY OF FEMUR;  Surgeon: Carole Civil, MD;  [facility];  Service: Orthopedics;  Laterality: Left;   CHONDROPLASTY Right 12/12/2018   Procedure: CHONDROPLASTY;  Surgeon: Carole Civil, MD;  [facility];  Service: Orthopedics;  Laterality: Right;   COLONOSCOPY N/A 01/27/2013   Dr. Oneida Alar: Right colon with poor prep (patient ate [place] necks the night before the procedure).  Moderate diverticulosis in the sigmoid  colon, moderate hemorrhoids.   COLONOSCOPY WITH PROPOFOL N/A 04/06/2020   Procedure: COLONOSCOPY WITH PROPOFOL;  Surgeon: Eloise Harman, DO;  [facility];  Service: Endoscopy;  Laterality: N/A;  8:15am   HEMATOMA EVACUATION N/A 08/03/2013   Procedure: EVACUATION PELVIC HEMATOMA;  Surgeon: Jonnie Kind, MD;  [facility];  Service: Gynecology;  Laterality: N/A;   KNEE ARTHROSCOPY WITH MEDIAL MENISECTOMY Left 01/14/2014   Procedure: KNEE ARTHROSCOPY WITH PARTIAL MEDIAL MENISECTOMY;  Surgeon: Carole Civil, MD;  [facility];  Service: Orthopedics;  Laterality: Left;   KNEE ARTHROSCOPY WITH MEDIAL MENISECTOMY Right 12/12/2018   Procedure: KNEE ARTHROSCOPY WITH MEDIAL MENISCECTOMY;  Surgeon: Carole Civil, MD;  [facility];  Service: Orthopedics;  Laterality: Right;   POLYPECTOMY  04/06/2020   Procedure: POLYPECTOMY;  Surgeon: Eloise Harman, DO;  [facility];  Service: Endoscopy;;   SUPRACERVICAL ABDOMINAL HYSTERECTOMY N/A 08/01/2013   Procedure: HYSTERECTOMY SUPRACERVICAL ABDOMINAL;  Surgeon: Jonnie Kind, MD;  [facility];  Service: Gynecology;  Laterality: N/A;   TOTAL KNEE ARTHROPLASTY Right 03/29/2021   Procedure: TOTAL KNEE ARTHROPLASTY;  Surgeon: Carole Civil, MD;  [facility];  Service: Orthopedics;  Laterality: Right;   TRANSFORAMINAL LUMBAR INTERBODY FUSION W/ MIS 1 LEVEL Right 06/14/2020   Procedure: Right  Lumbar Five- Sacral One Minimally invasive transforaminal lumbar interbody fusion;  Surgeon: Judith Part, MD;  Location: [facility];  Service: Neurosurgery;  Laterality: Right;  Right Lumbar Five- Sacral One Minimally invasive transforaminal lumbar interbody fusion   [place]    There were no vitals filed for this visit.   Subjective Assessment - 04/25/21 0911     Subjective Trouble sleeping at night because knee starts to throb    Currently in Pain? Yes    Pain Score 3     Pain  Location Knee    Pain Orientation Right    Pain Descriptors / Indicators Tightness;Sore    Pain Type Surgical pain                [facility] PT Assessment - 04/25/21 0001       Assessment   Medical Diagnosis Right TKR    Referring Provider (PT) Arther Abbott    Onset Date/Surgical Date 03/29/21                           [facility] Adult PT Treatment/Exercise - 04/25/21 0001       Knee/Hip Exercises: Stretches   Knee: Self-Stretch to increase Flexion Right    Knee: Self-Stretch Limitations 2x10 on stairs      Knee/Hip Exercises: Aerobic   Recumbent Bike use of bike for AAROM x 4 min      Knee/Hip Exercises: Machines for Strengthening   Cybex Knee Flexion 4 plates 3x10      Knee/Hip Exercises: Standing   Heel Raises Both;3 sets;10 reps    Lateral Step Up Right;2 sets;10 reps;Step Height: 4"    Forward Step Up Right;2 sets;10 reps;Step Height: 4"      Knee/Hip Exercises: Supine   Quad Sets Strengthening;Right;3 sets;10 reps    Heel Slides AROM;Right;2 sets;10 reps    Knee Extension AROM;Right    Knee Extension Limitations 5   lacking   Knee Flexion AROM;Right    Knee Flexion Limitations 111      Knee/Hip Exercises: Prone   Hamstring Curl 3 sets;10 reps    Hamstring Curl Limitations manually resisted    Other Prone Exercises terminal knee extension                       PT Short Term Goals - 04/18/21 0948       PT SHORT TERM GOAL #1   Title Pt will demo consistency and independence with her HEP to improve strength and mobility.    Time 3    Period Weeks    Status New    Target Date 05/09/21      PT SHORT TERM GOAL #2   Title Demonstrate full right knee extension and 110 flexion to improve transfer and gait kinematics    Baseline lacking 8 degrees extension, 100 degrees flexoin    Time 3    Period Weeks    Status New    Target Date 05/09/21      PT SHORT TERM GOAL #3   Title Demo improved LE strength and reduced risk for falls  as evident by time of 12 sec 5xSTS    Baseline 22 sec    Time 3    Period Weeks    Status New    Target Date 05/09/21               PT Long  Term Goals - 04/18/21 0949       PT LONG TERM GOAL #1   Title Demo normalized, independent gait pattern x 300 ft during 2MWT    Baseline 150 ft with cane and antalgic pattern    Time 6    Period Weeks    Status New    Target Date 05/30/21      PT LONG TERM GOAL #2   Title Demo independent, reciprocal stair ambulation to improve indepedence and safety    Baseline step-to with HR and cane    Time 6    Period Weeks    Status New    Target Date 05/30/21      PT LONG TERM GOAL #3   Title Demo 5/5 RLE strength to improve stability and activity tolerance for work duties    Baseline 3/5 right knee    Time 6    Period Weeks    Status New    Target Date 05/30/21                   Plan - 04/25/21 0945     Clinical Impression Statement Improving right knee ROM and strength with less trunk flexion to compensate when stepping up on stair. Continued sessions dincaited to imrpove right knee ROM/strength to normalize gait pattern    Personal Factors and Comorbidities Time since onset of injury/illness/exacerbation    Examination-Activity Limitations Bend;Lift;Stand;Stairs;Squat;Locomotion Level;Transfers    Examination-Participation Restrictions Cleaning;Community Activity;Driving;Yard Work;Occupation    Stability/Clinical Decision Making Stable/Uncomplicated    Rehab Potential Excellent    PT Frequency 3x / week    PT Duration 6 weeks    PT Treatment/Interventions ADLs/Self Care Home Management;DME Instruction;Gait training;Stair training;Functional mobility training;Therapeutic activities;Therapeutic exercise;Balance training;Patient/family education;Neuromuscular re-education;Manual techniques;Passive range of motion    PT Next Visit Plan continue with TKR protocol    PT Home Exercise Plan QS, heel slide, knee extension prop     Consulted and Agree with Plan of Care Patient             Patient will benefit from skilled therapeutic intervention in order to improve the following deficits and impairments:  Abnormal gait, Decreased balance, Decreased mobility, Decreased range of motion, Decreased strength, Improper body mechanics, Pain  Visit Diagnosis: Difficulty in walking, not elsewhere classified  Muscle weakness (generalized)  Other symptoms and signs involving the musculoskeletal system     Problem List Patient Active Problem List   Diagnosis Date Noted   S/P TKR (total knee replacement), right 03/29/2021   Isthmic spondylolisthesis 06/14/2020   Polypharmacy 02/09/2020   Encounter for screening colonoscopy 02/09/2020   DDD (degenerative disc disease), lumbar 12/08/2019   S/P right knee arthroscopy 12/12/18 *with chondroplasty patella  12/19/2018   Borderline diabetes 07/02/2015   Vitamin D deficiency 07/02/2015   Lumbar back pain 07/02/2015   Insomnia 10/29/2013   Arthritis of knee, degenerative 10/13/2013   Hand dermatitis 03/02/2012   Essential hypertension, benign 09/21/2011   Class 2 obesity 09/21/2011   Shoulder pain, right 09/21/2011   Asthma, intermittent 09/21/2011   Tobacco user 09/21/2011    Toniann Fail, PT 04/25/2021, 9:46 AM  [facility] [place], [place], [address] Phone: [phone]   Fax:  [phone]  Name: Robin Sherman MRN: [id number] Date of Birth: Dec 11, 1960

## 2021-04-26 ENCOUNTER — Other Ambulatory Visit: Payer: Self-pay | Admitting: Orthopedic Surgery

## 2021-04-26 NOTE — Telephone Encounter (Signed)
Patient called to request refil-states has run out *aware to contact office at least 1 day prior/just called this afternoon oxyCODONE-acetaminophen (PERCOCET/ROXICET) 5-325 MG tablet  [employer]

## 2021-04-27 ENCOUNTER — Ambulatory Visit (HOSPITAL_COMMUNITY): Payer: BC Managed Care – PPO

## 2021-04-27 ENCOUNTER — Encounter (HOSPITAL_COMMUNITY): Payer: Self-pay

## 2021-04-27 ENCOUNTER — Other Ambulatory Visit: Payer: Self-pay | Admitting: Orthopedic Surgery

## 2021-04-27 ENCOUNTER — Other Ambulatory Visit: Payer: Self-pay

## 2021-04-27 ENCOUNTER — Other Ambulatory Visit: Payer: Self-pay | Admitting: Radiology

## 2021-04-27 DIAGNOSIS — M6281 Muscle weakness (generalized): Secondary | ICD-10-CM

## 2021-04-27 DIAGNOSIS — R262 Difficulty in walking, not elsewhere classified: Secondary | ICD-10-CM | POA: Diagnosis not present

## 2021-04-27 DIAGNOSIS — G8918 Other acute postprocedural pain: Secondary | ICD-10-CM

## 2021-04-27 DIAGNOSIS — R29898 Other symptoms and signs involving the musculoskeletal system: Secondary | ICD-10-CM

## 2021-04-27 MED ORDER — OXYCODONE-ACETAMINOPHEN 5-325 MG PO TABS: 1.0000 | ORAL_TABLET | Freq: Four times a day (QID) | ORAL | 0 refills | Status: DC | PRN

## 2021-04-27 NOTE — Telephone Encounter (Signed)
-----   Message from Uvaldo Bristle sent at 04/27/2021 11:35 AM EDT ----- Regarding: phone note + surescripts in system [place], [place] [[phone]]  has been asking  + stopped here to ask why refill has not been done for pain medication - notes appear to be in epic - can we call back to patient when sent to pharm

## 2021-04-27 NOTE — Therapy (Signed)
[facility] [address] [place], [place], [address] Phone: [phone]   Fax:  [phone]  Physical Therapy Treatment  Patient Details  Name: Robin Sherman MRN: [id number] Date of Birth: 05-Dec-1960 Referring Provider (PT): Arther Abbott   Encounter Date: 04/27/2021   PT End of Session - 04/27/21 0905     Visit Number 5    Number of Visits 18    Date for PT Re-Evaluation 05/30/21    Authorization Type [employer] PPO    Authorization - Visit Number 5    Authorization - Number of Visits 60    Progress Note Due on Visit 10    PT Start Time 0900    PT Stop Time 0945    PT Time Calculation (min) 45 min    Activity Tolerance Patient tolerated treatment well    Behavior During Therapy [facility] for tasks assessed/performed             Past Medical History:  Diagnosis Date   Allergy    pollen   Arthritis    left knee   Asthma    Eczema    Followed by Dr. [place] Crane dermatology   Headache(784.0) 09/25/2012   Hypertension    Neuromuscular disorder ([place])    Pre-diabetes    Sciatica of left side 10/13/2013   TRIGGER FINGER 11/03/2008   Qualifier: Diagnosis of  By: Aline Brochure MD, Dorothyann Peng      Past Surgical History:  Procedure Laterality Date   ABDOMINAL HYSTERECTOMY     bleeding   BILATERAL SALPINGECTOMY Bilateral 08/01/2013   Procedure: BILATERAL SALPINGECTOMY;  Surgeon: Jonnie Kind, MD;  [facility];  Service: Gynecology;  Laterality: Bilateral;   CHONDROPLASTY Left 01/14/2014   Procedure: CHONDROPLASTY OF FEMUR;  Surgeon: Carole Civil, MD;  Location: [facility];  Service: Orthopedics;  Laterality: Left;   CHONDROPLASTY Right 12/12/2018   Procedure: CHONDROPLASTY;  Surgeon: Carole Civil, MD;  [facility];  Service: Orthopedics;  Laterality: Right;   COLONOSCOPY N/A 01/27/2013   Dr. Oneida Alar: Right colon with poor prep (patient ate [place] necks the night before the procedure).  Moderate diverticulosis in the sigmoid  colon, moderate hemorrhoids.   COLONOSCOPY WITH PROPOFOL N/A 04/06/2020   Procedure: COLONOSCOPY WITH PROPOFOL;  Surgeon: Eloise Harman, DO;  [facility];  Service: Endoscopy;  Laterality: N/A;  8:15am   HEMATOMA EVACUATION N/A 08/03/2013   Procedure: EVACUATION PELVIC HEMATOMA;  Surgeon: Jonnie Kind, MD;  [facility];  Service: Gynecology;  Laterality: N/A;   KNEE ARTHROSCOPY WITH MEDIAL MENISECTOMY Left 01/14/2014   Procedure: KNEE ARTHROSCOPY WITH PARTIAL MEDIAL MENISECTOMY;  Surgeon: Carole Civil, MD;  [facility];  Service: Orthopedics;  Laterality: Left;   KNEE ARTHROSCOPY WITH MEDIAL MENISECTOMY Right 12/12/2018   Procedure: KNEE ARTHROSCOPY WITH MEDIAL MENISCECTOMY;  Surgeon: Carole Civil, MD;  [facility];  Service: Orthopedics;  Laterality: Right;   POLYPECTOMY  04/06/2020   Procedure: POLYPECTOMY;  Surgeon: Eloise Harman, DO;  [facility];  Service: Endoscopy;;   SUPRACERVICAL ABDOMINAL HYSTERECTOMY N/A 08/01/2013   Procedure: HYSTERECTOMY SUPRACERVICAL ABDOMINAL;  Surgeon: Jonnie Kind, MD;  [facility];  Service: Gynecology;  Laterality: N/A;   TOTAL KNEE ARTHROPLASTY Right 03/29/2021   Procedure: TOTAL KNEE ARTHROPLASTY;  Surgeon: Carole Civil, MD;  [facility];  Service: Orthopedics;  Laterality: Right;   TRANSFORAMINAL LUMBAR INTERBODY FUSION W/ MIS 1 LEVEL Right 06/14/2020   Procedure: Right  Lumbar Five- Sacral One Minimally invasive transforaminal lumbar interbody fusion;  Surgeon: Judith Part, MD;  Location: [facility];  Service: Neurosurgery;  Laterality: Right;  Right Lumbar Five- Sacral One Minimally invasive transforaminal lumbar interbody fusion   [place]    There were no vitals filed for this visit.   Subjective Assessment - 04/27/21 0904     Subjective "Better"    Currently in Pain? Yes    Pain Score 3     Pain Location Knee    Pain Orientation Right     Pain Descriptors / Indicators Tightness    Pain Type Surgical pain                [facility] PT Assessment - 04/27/21 0001       Assessment   Medical Diagnosis Right TKR    Referring Provider (PT) Arther Abbott    Onset Date/Surgical Date 03/29/21                           [facility] Adult PT Treatment/Exercise - 04/27/21 0001       Knee/Hip Exercises: Aerobic   Recumbent Bike use of bike for AAROM x 4 min      Knee/Hip Exercises: Machines for Strengthening   Cybex Knee Flexion 5 plates, 2x10      Knee/Hip Exercises: Standing   Knee Flexion Strengthening;Right;2 sets;10 reps    Knee Flexion Limitations 3#    Lateral Step Up Right;3 sets;10 reps;Step Height: 6"    Forward Step Up Right;3 sets;10 reps;Step Height: 6"    Gait Training retrowalking x 2.5 min      Knee/Hip Exercises: Seated   Long Arc Quad Strengthening;Right;3 sets;10 reps    Long Arc Quad Weight 3 lbs.      Knee/Hip Exercises: Supine   Quad Sets Strengthening;Right;3 sets;10 reps    Heel Slides AAROM;Right;3 sets;10 reps    Heel Slides Limitations 5 sec    Knee Extension AROM;Right    Knee Extension Limitations 0    Knee Flexion AROM;Right    Knee Flexion Limitations 114                       PT Short Term Goals - 04/18/21 0948       PT SHORT TERM GOAL #1   Title Pt will demo consistency and independence with her HEP to improve strength and mobility.    Time 3    Period Weeks    Status New    Target Date 05/09/21      PT SHORT TERM GOAL #2   Title Demonstrate full right knee extension and 110 flexion to improve transfer and gait kinematics    Baseline lacking 8 degrees extension, 100 degrees flexoin    Time 3    Period Weeks    Status New    Target Date 05/09/21      PT SHORT TERM GOAL #3   Title Demo improved LE strength and reduced risk for falls as evident by time of 12 sec 5xSTS    Baseline 22 sec    Time 3    Period Weeks    Status New    Target Date  05/09/21               PT Long Term Goals - 04/18/21 0949       PT LONG TERM GOAL #1  Title Demo normalized, independent gait pattern x 300 ft during 2MWT    Baseline 150 ft with cane and antalgic pattern    Time 6    Period Weeks    Status New    Target Date 05/30/21      PT LONG TERM GOAL #2   Title Demo independent, reciprocal stair ambulation to improve indepedence and safety    Baseline step-to with HR and cane    Time 6    Period Weeks    Status New    Target Date 05/30/21      PT LONG TERM GOAL #3   Title Demo 5/5 RLE strength to improve stability and activity tolerance for work duties    Baseline 3/5 right knee    Time 6    Period Weeks    Status New    Target Date 05/30/21                   Plan - 04/27/21 0937     Clinical Impression Statement Improved right knee ROM achieving full extension and increased flexion to 114 degrees. Progressed with PRE to include resisted open chain exercises and increased repetition for WBing/CKC exercises without adverse effects. Continued POC to progress ROM and strength to enable ambulation without AD    Personal Factors and Comorbidities Time since onset of injury/illness/exacerbation    Examination-Activity Limitations Bend;Lift;Stand;Stairs;Squat;Locomotion Level;Transfers    Examination-Participation Restrictions Cleaning;Community Activity;Driving;Yard Work;Occupation    Stability/Clinical Decision Making Stable/Uncomplicated    Rehab Potential Excellent    PT Frequency 3x / week    PT Duration 6 weeks    PT Treatment/Interventions ADLs/Self Care Home Management;DME Instruction;Gait training;Stair training;Functional mobility training;Therapeutic activities;Therapeutic exercise;Balance training;Patient/family education;Neuromuscular re-education;Manual techniques;Passive range of motion    PT Next Visit Plan continue with TKR protocol    PT Home Exercise Plan QS, heel slide, knee extension prop    Consulted  and Agree with Plan of Care Patient             Patient will benefit from skilled therapeutic intervention in order to improve the following deficits and impairments:  Abnormal gait, Decreased balance, Decreased mobility, Decreased range of motion, Decreased strength, Improper body mechanics, Pain  Visit Diagnosis: Difficulty in walking, not elsewhere classified  Muscle weakness (generalized)  Other symptoms and signs involving the musculoskeletal system     Problem List Patient Active Problem List   Diagnosis Date Noted   S/P TKR (total knee replacement), right 03/29/2021   Isthmic spondylolisthesis 06/14/2020   Polypharmacy 02/09/2020   Encounter for screening colonoscopy 02/09/2020   DDD (degenerative disc disease), lumbar 12/08/2019   S/P right knee arthroscopy 12/12/18 *with chondroplasty patella  12/19/2018   Borderline diabetes 07/02/2015   Vitamin D deficiency 07/02/2015   Lumbar back pain 07/02/2015   Insomnia 10/29/2013   Arthritis of knee, degenerative 10/13/2013   Hand dermatitis 03/02/2012   Essential hypertension, benign 09/21/2011   Class 2 obesity 09/21/2011   Shoulder pain, right 09/21/2011   Asthma, intermittent 09/21/2011   Tobacco user 09/21/2011    Toniann Fail, PT 04/27/2021, 9:41 AM  [facility] [place], [place], [address] Phone: [phone]   Fax:  [phone]  Name: Robin Sherman MRN: [id number] Date of Birth: [birth year]

## 2021-04-27 NOTE — Telephone Encounter (Signed)
It looks like Dr Lemmie Evens sent it but not showing in her med list I sent to him again so it gets sent.

## 2021-04-29 ENCOUNTER — Ambulatory Visit (HOSPITAL_COMMUNITY): Payer: BC Managed Care – PPO

## 2021-04-29 ENCOUNTER — Other Ambulatory Visit: Payer: Self-pay

## 2021-04-29 DIAGNOSIS — M6281 Muscle weakness (generalized): Secondary | ICD-10-CM

## 2021-04-29 DIAGNOSIS — R262 Difficulty in walking, not elsewhere classified: Secondary | ICD-10-CM | POA: Diagnosis not present

## 2021-04-29 DIAGNOSIS — R29898 Other symptoms and signs involving the musculoskeletal system: Secondary | ICD-10-CM | POA: Diagnosis not present

## 2021-04-29 NOTE — Therapy (Signed)
[facility] [address] [place], [place], [address] Phone: [phone]   Fax:  [phone]  Physical Therapy Treatment  Patient Details  Name: Robin Sherman MRN: [id number] Date of Birth: 1961/07/11 Referring Provider (PT): Arther Abbott   Encounter Date: 04/29/2021   PT End of Session - 04/29/21 0913     Visit Number 6    Number of Visits 18    Date for PT Re-Evaluation 05/30/21    Authorization Type [employer] PPO    Authorization - Visit Number 6    Authorization - Number of Visits 60    Progress Note Due on Visit 10    PT Start Time 0902    PT Stop Time 0945    PT Time Calculation (min) 43 min    Activity Tolerance Patient tolerated treatment well    Behavior During Therapy [facility] for tasks assessed/performed             Past Medical History:  Diagnosis Date   Allergy    pollen   Arthritis    left knee   Asthma    Eczema    Followed by Dr. [place] Crane dermatology   Headache(784.0) 09/25/2012   Hypertension    Neuromuscular disorder ([place])    Pre-diabetes    Sciatica of left side 10/13/2013   TRIGGER FINGER 11/03/2008   Qualifier: Diagnosis of  By: Aline Brochure MD, Dorothyann Peng      Past Surgical History:  Procedure Laterality Date   ABDOMINAL HYSTERECTOMY     bleeding   BILATERAL SALPINGECTOMY Bilateral 08/01/2013   Procedure: BILATERAL SALPINGECTOMY;  Surgeon: Jonnie Kind, MD;  [facility];  Service: Gynecology;  Laterality: Bilateral;   CHONDROPLASTY Left 01/14/2014   Procedure: CHONDROPLASTY OF FEMUR;  Surgeon: Carole Civil, MD;  [facility];  Service: Orthopedics;  Laterality: Left;   CHONDROPLASTY Right 12/12/2018   Procedure: CHONDROPLASTY;  Surgeon: Carole Civil, MD;  [facility];  Service: Orthopedics;  Laterality: Right;   COLONOSCOPY N/A 01/27/2013   Dr. Oneida Alar: Right colon with poor prep (patient ate [place] necks the night before the procedure).  Moderate diverticulosis in the sigmoid  colon, moderate hemorrhoids.   COLONOSCOPY WITH PROPOFOL N/A 04/06/2020   Procedure: COLONOSCOPY WITH PROPOFOL;  Surgeon: Eloise Harman, DO;  [facility];  Service: Endoscopy;  Laterality: N/A;  8:15am   HEMATOMA EVACUATION N/A 08/03/2013   Procedure: EVACUATION PELVIC HEMATOMA;  Surgeon: Jonnie Kind, MD;  [facility];  Service: Gynecology;  Laterality: N/A;   KNEE ARTHROSCOPY WITH MEDIAL MENISECTOMY Left 01/14/2014   Procedure: KNEE ARTHROSCOPY WITH PARTIAL MEDIAL MENISECTOMY;  Surgeon: Carole Civil, MD;  [facility];  Service: Orthopedics;  Laterality: Left;   KNEE ARTHROSCOPY WITH MEDIAL MENISECTOMY Right 12/12/2018   Procedure: KNEE ARTHROSCOPY WITH MEDIAL MENISCECTOMY;  Surgeon: Carole Civil, MD;  [facility];  Service: Orthopedics;  Laterality: Right;   POLYPECTOMY  04/06/2020   Procedure: POLYPECTOMY;  Surgeon: Eloise Harman, DO;  [facility];  Service: Endoscopy;;   SUPRACERVICAL ABDOMINAL HYSTERECTOMY N/A 08/01/2013   Procedure: HYSTERECTOMY SUPRACERVICAL ABDOMINAL;  Surgeon: Jonnie Kind, MD;  [facility];  Service: Gynecology;  Laterality: N/A;   TOTAL KNEE ARTHROPLASTY Right 03/29/2021   Procedure: TOTAL KNEE ARTHROPLASTY;  Surgeon: Carole Civil, MD;  [facility];  Service: Orthopedics;  Laterality: Right;   TRANSFORAMINAL LUMBAR INTERBODY FUSION W/ MIS 1 LEVEL Right 06/14/2020   Procedure: Right  Lumbar Five- Sacral One Minimally invasive transforaminal lumbar interbody fusion;  Surgeon: Judith Part, MD;  Location: [facility];  Service: Neurosurgery;  Laterality: Right;  Right Lumbar Five- Sacral One Minimally invasive transforaminal lumbar interbody fusion   [place]    There were no vitals filed for this visit.   Subjective Assessment - 04/29/21 0928     Subjective Ready to walk without cane    Currently in Pain? Yes    Pain Score 2     Pain Location Knee    Pain  Orientation Right    Pain Descriptors / Indicators Throbbing    Pain Type Surgical pain                               [facility] Adult PT Treatment/Exercise - 04/29/21 0001       Ambulation/Gait   Ambulation/Gait Yes    Ambulation/Gait Assistance 7: Independent    Ambulation Distance (Feet) 200 Feet    Assistive device None    Gait Pattern Within Functional Limits    Ambulation Surface Level;Indoor      Knee/Hip Exercises: Aerobic   Recumbent Bike use of bike for AAROM x 4 min      Knee/Hip Exercises: Machines for Strengthening   Cybex Knee Flexion 5 plates, 3x10      Knee/Hip Exercises: Standing   Lateral Step Up Right;2 sets;10 reps;Step Height: 8"    Forward Step Up Right;2 sets;10 reps;Step Height: 8"    Rocker Board 4 minutes   2 min lateral, 2 min ant-post   Walking with Sports Cord retro-forward x 2.5 min 3 plates      Knee/Hip Exercises: Supine   Heel Slides AAROM;Right;3 sets;10 reps    Knee Extension AROM;Right    Knee Extension Limitations 0    Knee Flexion AROM;Right    Knee Flexion Limitations 114                       PT Short Term Goals - 04/18/21 0948       PT SHORT TERM GOAL #1   Title Pt will demo consistency and independence with her HEP to improve strength and mobility.    Time 3    Period Weeks    Status New    Target Date 05/09/21      PT SHORT TERM GOAL #2   Title Demonstrate full right knee extension and 110 flexion to improve transfer and gait kinematics    Baseline lacking 8 degrees extension, 100 degrees flexoin    Time 3    Period Weeks    Status New    Target Date 05/09/21      PT SHORT TERM GOAL #3   Title Demo improved LE strength and reduced risk for falls as evident by time of 12 sec 5xSTS    Baseline 22 sec    Time 3    Period Weeks    Status New    Target Date 05/09/21               PT Long Term Goals - 04/18/21 0949       PT LONG TERM GOAL #1   Title Demo normalized, independent  gait pattern x 300 ft during 2MWT    Baseline 150 ft with cane and antalgic pattern    Time 6    Period Weeks  Status New    Target Date 05/30/21      PT LONG TERM GOAL #2   Title Demo independent, reciprocal stair ambulation to improve indepedence and safety    Baseline step-to with HR and cane    Time 6    Period Weeks    Status New    Target Date 05/30/21      PT LONG TERM GOAL #3   Title Demo 5/5 RLE strength to improve stability and activity tolerance for work duties    Baseline 3/5 right knee    Time 6    Period Weeks    Status New    Target Date 05/30/21                   Plan - 04/29/21 0947     Clinical Impression Statement Pt progressing very well with POC details and ambulates without cane and no RLE antalgia. Continued sessions to progress RLE strength and improve stability and dynamic balance.    Personal Factors and Comorbidities Time since onset of injury/illness/exacerbation    Examination-Activity Limitations Bend;Lift;Stand;Stairs;Squat;Locomotion Level;Transfers    Examination-Participation Restrictions Cleaning;Community Activity;Driving;Yard Work;Occupation    Stability/Clinical Decision Making Stable/Uncomplicated    Rehab Potential Excellent    PT Frequency 3x / week    PT Duration 6 weeks    PT Treatment/Interventions ADLs/Self Care Home Management;DME Instruction;Gait training;Stair training;Functional mobility training;Therapeutic activities;Therapeutic exercise;Balance training;Patient/family education;Neuromuscular re-education;Manual techniques;Passive range of motion    PT Next Visit Plan continue with TKR protocol    PT Home Exercise Plan QS, heel slide, knee extension prop    Consulted and Agree with Plan of Care Patient             Patient will benefit from skilled therapeutic intervention in order to improve the following deficits and impairments:  Abnormal gait, Decreased balance, Decreased mobility, Decreased range of  motion, Decreased strength, Improper body mechanics, Pain  Visit Diagnosis: Difficulty in walking, not elsewhere classified  Muscle weakness (generalized)  Other symptoms and signs involving the musculoskeletal system     Problem List Patient Active Problem List   Diagnosis Date Noted   S/P TKR (total knee replacement), right 03/29/2021   Isthmic spondylolisthesis 06/14/2020   Polypharmacy 02/09/2020   Encounter for screening colonoscopy 02/09/2020   DDD (degenerative disc disease), lumbar 12/08/2019   S/P right knee arthroscopy 12/12/18 *with chondroplasty patella  12/19/2018   Borderline diabetes 07/02/2015   Vitamin D deficiency 07/02/2015   Lumbar back pain 07/02/2015   Insomnia 10/29/2013   Arthritis of knee, degenerative 10/13/2013   Hand dermatitis 03/02/2012   Essential hypertension, benign 09/21/2011   Class 2 obesity 09/21/2011   Shoulder pain, right 09/21/2011   Asthma, intermittent 09/21/2011   Tobacco user 09/21/2011    Toniann Fail, PT 04/29/2021, 9:49 AM  [facility] [place], [place], [address] Phone: [phone]   Fax:  [phone]  Name: Jenipher Havel MRN: [id number] Date of Birth: [birth year]

## 2021-05-02 ENCOUNTER — Other Ambulatory Visit: Payer: Self-pay

## 2021-05-02 ENCOUNTER — Ambulatory Visit (HOSPITAL_COMMUNITY): Payer: BC Managed Care – PPO

## 2021-05-02 DIAGNOSIS — R262 Difficulty in walking, not elsewhere classified: Secondary | ICD-10-CM | POA: Diagnosis not present

## 2021-05-02 DIAGNOSIS — R29898 Other symptoms and signs involving the musculoskeletal system: Secondary | ICD-10-CM | POA: Diagnosis not present

## 2021-05-02 DIAGNOSIS — M6281 Muscle weakness (generalized): Secondary | ICD-10-CM | POA: Diagnosis not present

## 2021-05-02 NOTE — Therapy (Signed)
[facility] [address] [place], [place], [address] Phone: [phone]   Fax:  [phone]  Physical Therapy Treatment  Patient Details  Name: Robin Sherman MRN: [id number] Date of Birth: 13-Nov-1960 Referring Provider (PT): Arther Abbott   Encounter Date: 05/02/2021   PT End of Session - 05/02/21 0908     Visit Number 7    Number of Visits 18    Date for PT Re-Evaluation 05/30/21    Authorization Type [employer] PPO    Authorization - Visit Number 7    Authorization - Number of Visits 60    Progress Note Due on Visit 10    PT Start Time 0904    PT Stop Time 0945    PT Time Calculation (min) 41 min    Activity Tolerance Patient tolerated treatment well    Behavior During Therapy [facility] for tasks assessed/performed             Past Medical History:  Diagnosis Date   Allergy    pollen   Arthritis    left knee   Asthma    Eczema    Followed by Dr. [place] Crane dermatology   Headache(784.0) 09/25/2012   Hypertension    Neuromuscular disorder ([place])    Pre-diabetes    Sciatica of left side 10/13/2013   TRIGGER FINGER 11/03/2008   Qualifier: Diagnosis of  By: Aline Brochure MD, Dorothyann Peng      Past Surgical History:  Procedure Laterality Date   ABDOMINAL HYSTERECTOMY     bleeding   BILATERAL SALPINGECTOMY Bilateral 08/01/2013   Procedure: BILATERAL SALPINGECTOMY;  Surgeon: Jonnie Kind, MD;  [facility]: [facility];  Service: Gynecology;  Laterality: Bilateral;   CHONDROPLASTY Left 01/14/2014   Procedure: CHONDROPLASTY OF FEMUR;  Surgeon: Carole Civil, MD;  Location: [facility];  Service: Orthopedics;  Laterality: Left;   CHONDROPLASTY Right 12/12/2018   Procedure: CHONDROPLASTY;  Surgeon: Carole Civil, MD;  [facility]: [facility];  Service: Orthopedics;  Laterality: Right;   COLONOSCOPY N/A 01/27/2013   Dr. Oneida Alar: Right colon with poor prep (patient ate [place] necks the night before the procedure).  Moderate diverticulosis in the sigmoid  colon, moderate hemorrhoids.   COLONOSCOPY WITH PROPOFOL N/A 04/06/2020   Procedure: COLONOSCOPY WITH PROPOFOL;  Surgeon: Eloise Harman, DO;  [facility];  Service: Endoscopy;  Laterality: N/A;  8:15am   HEMATOMA EVACUATION N/A 08/03/2013   Procedure: EVACUATION PELVIC HEMATOMA;  Surgeon: Jonnie Kind, MD;  [facility];  Service: Gynecology;  Laterality: N/A;   KNEE ARTHROSCOPY WITH MEDIAL MENISECTOMY Left 01/14/2014   Procedure: KNEE ARTHROSCOPY WITH PARTIAL MEDIAL MENISECTOMY;  Surgeon: Carole Civil, MD;  [facility];  Service: Orthopedics;  Laterality: Left;   KNEE ARTHROSCOPY WITH MEDIAL MENISECTOMY Right 12/12/2018   Procedure: KNEE ARTHROSCOPY WITH MEDIAL MENISCECTOMY;  Surgeon: Carole Civil, MD;  [facility];  Service: Orthopedics;  Laterality: Right;   POLYPECTOMY  04/06/2020   Procedure: POLYPECTOMY;  Surgeon: Eloise Harman, DO;  [facility];  Service: Endoscopy;;   SUPRACERVICAL ABDOMINAL HYSTERECTOMY N/A 08/01/2013   Procedure: HYSTERECTOMY SUPRACERVICAL ABDOMINAL;  Surgeon: Jonnie Kind, MD;  [facility];  Service: Gynecology;  Laterality: N/A;   TOTAL KNEE ARTHROPLASTY Right 03/29/2021   Procedure: TOTAL KNEE ARTHROPLASTY;  Surgeon: Carole Civil, MD;  [facility];  Service: Orthopedics;  Laterality: Right;   TRANSFORAMINAL LUMBAR INTERBODY FUSION W/ MIS 1 LEVEL Right 06/14/2020   Procedure: Right  Lumbar Five- Sacral One Minimally invasive transforaminal lumbar interbody fusion;  Surgeon: Judith Part, MD;  Location: [facility];  Service: Neurosurgery;  Laterality: Right;  Right Lumbar Five- Sacral One Minimally invasive transforaminal lumbar interbody fusion   [place]    There were no vitals filed for this visit.   Subjective Assessment - 05/02/21 0908     Subjective Not doing too bad, some pain in the left knee since walking with the cane.    Currently in Pain? Yes    Pain  Score 3                 [facility] PT Assessment - 05/02/21 0001       Assessment   Medical Diagnosis Right TKR    Referring Provider (PT) Arther Abbott    Onset Date/Surgical Date 03/29/21                           [facility] Adult PT Treatment/Exercise - 05/02/21 0001       Knee/Hip Exercises: Stretches   Knee: Self-Stretch to increase Flexion Right    Knee: Self-Stretch Limitations 2x10 on stairs      Knee/Hip Exercises: Aerobic   Recumbent Bike level 2 x 5 min for warm-up, full revolution      Knee/Hip Exercises: Machines for Strengthening   Cybex Knee Flexion 5 plates, 3x10      Knee/Hip Exercises: Standing   Lateral Step Up Right;3 sets;10 reps;Step Height: 8"    Forward Step Up Right;3 sets;10 reps;Step Height: 8"    Walking with Sports Cord retro-forward 2x 2 min 4 plates    Other Standing Knee Exercises sidestepping x 2 min with 5#      Knee/Hip Exercises: Seated   Long Probation officer    Long Arc Quad Weight 5 lbs.                       PT Short Term Goals - 04/18/21 0948       PT SHORT TERM GOAL #1   Title Pt will demo consistency and independence with her HEP to improve strength and mobility.    Time 3    Period Weeks    Status New    Target Date 05/09/21      PT SHORT TERM GOAL #2   Title Demonstrate full right knee extension and 110 flexion to improve transfer and gait kinematics    Baseline lacking 8 degrees extension, 100 degrees flexoin    Time 3    Period Weeks    Status New    Target Date 05/09/21      PT SHORT TERM GOAL #3   Title Demo improved LE strength and reduced risk for falls as evident by time of 12 sec 5xSTS    Baseline 22 sec    Time 3    Period Weeks    Status New    Target Date 05/09/21               PT Long Term Goals - 04/18/21 0949       PT LONG TERM GOAL #1   Title Demo normalized, independent gait pattern x 300 ft during 2MWT    Baseline 150 ft with cane and antalgic  pattern    Time 6    Period Weeks    Status New    Target Date 05/30/21  PT LONG TERM GOAL #2   Title Demo independent, reciprocal stair ambulation to improve indepedence and safety    Baseline step-to with HR and cane    Time 6    Period Weeks    Status New    Target Date 05/30/21      PT LONG TERM GOAL #3   Title Demo 5/5 RLE strength to improve stability and activity tolerance for work duties    Baseline 3/5 right knee    Time 6    Period Weeks    Status New    Target Date 05/30/21                   Plan - 05/02/21 0940     Clinical Impression Statement Demonstrating good activity tolerance and progressing with strength activities using 5 lbs ankle weight and demonstrating good form and control throughout. 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 as she continues to demonstrate difficulty with right knee flexion, some ROM limitations, and mild gait impairments requiring rest periods ad lib    Personal Factors and Comorbidities Time since onset of injury/illness/exacerbation    Examination-Activity Limitations Bend;Lift;Stand;Stairs;Squat;Locomotion Level;Transfers    Examination-Participation Restrictions Cleaning;Community Activity;Driving;Yard Work;Occupation    Stability/Clinical Decision Making Stable/Uncomplicated    Rehab Potential Excellent    PT Frequency 3x / week    PT Duration 6 weeks    PT Treatment/Interventions ADLs/Self Care Home Management;DME Instruction;Gait training;Stair training;Functional mobility training;Therapeutic activities;Therapeutic exercise;Balance training;Patient/family education;Neuromuscular re-education;Manual techniques;Passive range of motion    PT Next Visit Plan continue with TKR protocol    PT Home Exercise Plan QS, heel slide, knee extension prop    Consulted and Agree with Plan of Care Patient              Patient will benefit from skilled therapeutic intervention in order to improve the following deficits and impairments:  Abnormal gait, Decreased balance, Decreased mobility, Decreased range of motion, Decreased strength, Improper body mechanics, Pain  Visit Diagnosis: Difficulty in walking, not elsewhere classified  Muscle weakness (generalized)  Other symptoms and signs involving the musculoskeletal system     Problem List Patient Active Problem List   Diagnosis Date Noted   S/P TKR (total knee replacement), right 03/29/2021   Isthmic spondylolisthesis 06/14/2020   Polypharmacy 02/09/2020   Encounter for screening colonoscopy 02/09/2020   DDD (degenerative disc disease), lumbar 12/08/2019   S/P right knee arthroscopy 12/12/18 *with chondroplasty patella  12/19/2018   Borderline diabetes 07/02/2015   Vitamin D deficiency 07/02/2015   Lumbar back pain 07/02/2015   Insomnia 10/29/2013   Arthritis of knee, degenerative 10/13/2013   Hand dermatitis 03/02/2012   Essential hypertension, benign 09/21/2011   Class 2 obesity 09/21/2011   Shoulder pain, right 09/21/2011   Asthma, intermittent 09/21/2011   Tobacco user 09/21/2011    Toniann Fail, PT 05/02/2021, 9:45 AM  [place], [place], [address] Phone: [phone]   Fax:  [phone]  Name: Robin Sherman MRN: [id number] Date of Birth: April 21, 1961

## 2021-05-04 ENCOUNTER — Ambulatory Visit (HOSPITAL_COMMUNITY): Payer: BC Managed Care – PPO

## 2021-05-04 ENCOUNTER — Encounter: Payer: Self-pay | Admitting: Orthopedic Surgery

## 2021-05-04 ENCOUNTER — Ambulatory Visit (INDEPENDENT_AMBULATORY_CARE_PROVIDER_SITE_OTHER): Payer: BC Managed Care – PPO | Admitting: Orthopedic Surgery

## 2021-05-04 ENCOUNTER — Other Ambulatory Visit: Payer: Self-pay

## 2021-05-04 DIAGNOSIS — M6281 Muscle weakness (generalized): Secondary | ICD-10-CM | POA: Diagnosis not present

## 2021-05-04 DIAGNOSIS — R29898 Other symptoms and signs involving the musculoskeletal system: Secondary | ICD-10-CM

## 2021-05-04 DIAGNOSIS — M25562 Pain in left knee: Secondary | ICD-10-CM | POA: Diagnosis not present

## 2021-05-04 DIAGNOSIS — R262 Difficulty in walking, not elsewhere classified: Secondary | ICD-10-CM

## 2021-05-04 DIAGNOSIS — G8929 Other chronic pain: Secondary | ICD-10-CM

## 2021-05-04 DIAGNOSIS — Z96651 Presence of right artificial knee joint: Secondary | ICD-10-CM

## 2021-05-04 NOTE — Patient Instructions (Signed)
[place] [address]

## 2021-05-04 NOTE — Therapy (Signed)
[facility] [address] [place], [place], [address] Phone: [phone]   Fax:  [phone]  Physical Therapy Treatment and Progress Note  Patient Details  Name: Robin Sherman MRN: [id number] Date of Birth: 03/29/1961 Referring Provider (PT): Arther Abbott  Progress Note Reporting Period 04/18/21 to 05/04/21  See note below for Objective Data and Assessment of Progress/Goals.     Encounter Date: 05/04/2021   PT End of Session - 05/04/21 0910     Visit Number 8    Number of Visits 18    Date for PT Re-Evaluation 05/30/21    Authorization Type [employer] PPO    Authorization - Visit Number 8    Authorization - Number of Visits 60    Progress Note Due on Visit 18    PT Start Time 0900    PT Stop Time 0945    PT Time Calculation (min) 45 min    Activity Tolerance Patient tolerated treatment well    Behavior During Therapy WFL for tasks assessed/performed             Past Medical History:  Diagnosis Date   Allergy    pollen   Arthritis    left knee   Asthma    Eczema    Followed by Dr. [place] Crane dermatology   Headache(784.0) 09/25/2012   Hypertension    Neuromuscular disorder ([place])    Pre-diabetes    Sciatica of left side 10/13/2013   TRIGGER FINGER 11/03/2008   Qualifier: Diagnosis of  By: Aline Brochure MD, Dorothyann Peng      Past Surgical History:  Procedure Laterality Date   ABDOMINAL HYSTERECTOMY     bleeding   BILATERAL SALPINGECTOMY Bilateral 08/01/2013   Procedure: BILATERAL SALPINGECTOMY;  Surgeon: Jonnie Kind, MD;  [facility];  Service: Gynecology;  Laterality: Bilateral;   CHONDROPLASTY Left 01/14/2014   Procedure: CHONDROPLASTY OF FEMUR;  Surgeon: Carole Civil, MD;  [facility];  Service: Orthopedics;  Laterality: Left;   CHONDROPLASTY Right 12/12/2018   Procedure: CHONDROPLASTY;  Surgeon: Carole Civil, MD;  [facility];  Service: Orthopedics;  Laterality: Right;   COLONOSCOPY N/A  01/27/2013   Dr. Oneida Alar: Right colon with poor prep (patient ate [place] necks the night before the procedure).  Moderate diverticulosis in the sigmoid colon, moderate hemorrhoids.   COLONOSCOPY WITH PROPOFOL N/A 04/06/2020   Procedure: COLONOSCOPY WITH PROPOFOL;  Surgeon: Eloise Harman, DO;  [facility];  Service: Endoscopy;  Laterality: N/A;  8:15am   HEMATOMA EVACUATION N/A 08/03/2013   Procedure: EVACUATION PELVIC HEMATOMA;  Surgeon: Jonnie Kind, MD;  [facility];  Service: Gynecology;  Laterality: N/A;   KNEE ARTHROSCOPY WITH MEDIAL MENISECTOMY Left 01/14/2014   Procedure: KNEE ARTHROSCOPY WITH PARTIAL MEDIAL MENISECTOMY;  Surgeon: Carole Civil, MD;  [facility];  Service: Orthopedics;  Laterality: Left;   KNEE ARTHROSCOPY WITH MEDIAL MENISECTOMY Right 12/12/2018   Procedure: KNEE ARTHROSCOPY WITH MEDIAL MENISCECTOMY;  Surgeon: Carole Civil, MD;  [facility];  Service: Orthopedics;  Laterality: Right;   POLYPECTOMY  04/06/2020   Procedure: POLYPECTOMY;  Surgeon: Eloise Harman, DO;  [facility];  Service: Endoscopy;;   SUPRACERVICAL ABDOMINAL HYSTERECTOMY N/A 08/01/2013   Procedure: HYSTERECTOMY SUPRACERVICAL ABDOMINAL;  Surgeon: Jonnie Kind, MD;  [facility];  Service: Gynecology;  Laterality: N/A;   TOTAL KNEE ARTHROPLASTY Right 03/29/2021   Procedure: TOTAL KNEE ARTHROPLASTY;  Surgeon: Carole Civil, MD;  [facility];  Service: Orthopedics;  Laterality: Right;   TRANSFORAMINAL LUMBAR INTERBODY FUSION W/ MIS 1 LEVEL Right 06/14/2020   Procedure: Right Lumbar Five- Sacral One Minimally invasive transforaminal lumbar interbody fusion;  Surgeon: Judith Part, MD;  Location: [facility];  Service: Neurosurgery;  Laterality: Right;  Right Lumbar Five- Sacral One Minimally invasive transforaminal lumbar interbody fusion   [place]    There were no vitals filed for this visit.   Subjective  Assessment - 05/04/21 0910     Subjective "Soreness along right knee and thigh, possibly from the way i slept"    Currently in Pain? Yes    Pain Score 5     Pain Location Knee    Pain Orientation Right    Pain Descriptors / Indicators Aching    Pain Type Surgical pain                [facility] PT Assessment - 05/04/21 0001       Assessment   Medical Diagnosis Right TKR    Referring Provider (PT) Arther Abbott    Onset Date/Surgical Date 03/29/21                           [facility] Adult PT Treatment/Exercise - 05/04/21 0001       Knee/Hip Exercises: Stretches   Gastroc Stretch Both;3 reps;60 seconds    Gastroc Stretch Limitations with slantboard.      Knee/Hip Exercises: Aerobic   Nustep level 4 x 8 min for dynamic warm-up      Knee/Hip Exercises: Machines for Strengthening   Cybex Knee Flexion 4 plates, 3x10      Knee/Hip Exercises: Standing   SLS with Vectors weight shifting on airex pad and marching x 2 min.      Knee/Hip Exercises: Supine   Quad Sets Strengthening;Right;3 sets;10 reps    Short Arc Quad Sets Strengthening;Right;3 sets;10 reps    Short Arc Quad Sets Limitations 3# 3 sec hold    Heel Slides AAROM;Right;3 sets;10 reps    Knee Extension AROM;Right    Knee Extension Limitations 0    Knee Flexion AROM;Right    Knee Flexion Limitations 114                       PT Short Term Goals - 05/04/21 5465       PT SHORT TERM GOAL #1   Title Pt will demo consistency and independence with her HEP to improve strength and mobility.    Baseline on-going    Time 3    Period Weeks    Status On-going    Target Date 05/09/21      PT SHORT TERM GOAL #2   Title Demonstrate full right knee extension and 110 flexion to improve transfer and gait kinematics    Baseline full extension, 114 degrees flexion    Time 3    Period Weeks    Status Achieved    Target Date 05/09/21      PT SHORT TERM GOAL #3   Title Demo improved LE  strength and reduced risk for falls as evident by time of 12 sec 5xSTS    Baseline 22 sec at [employer], currently 16 sec    Time 3    Period Weeks    Status On-going    Target Date 05/09/21  PT Long Term Goals - 05/04/21 0935       PT LONG TERM GOAL #1   Title Demo normalized, independent gait pattern x 300 ft during 2MWT    Baseline 150 ft with cane and antalgic pattern    Time 6    Period Weeks    Status New      PT LONG TERM GOAL #2   Title Demo independent, reciprocal stair ambulation to improve indepedence and safety    Baseline step-to with HR and cane    Time 6    Period Weeks    Status New      PT LONG TERM GOAL #3   Title Demo 5/5 RLE strength to improve stability and activity tolerance for work duties    Baseline 3/5 right knee    Time 6    Period Weeks    Status New                   Plan - 05/04/21 0940     Clinical Impression Statement Progressing well with POC details and demonstrating improved gait mechanics ambulating with minimal antalgia and no use of AD. Improved LE strength performing 5xSTS in 16 sec vs 22 sec at outset. Good return of right knee ROM. Continued sessions indicated to improve strength, dynamic balance, and actvity tolerance to enable safe return to employment duties    Personal Factors and Comorbidities Time since onset of injury/illness/exacerbation    Examination-Activity Limitations Bend;Lift;Stand;Stairs;Squat;Locomotion Level;Transfers    Examination-Participation Restrictions Cleaning;Community Activity;Driving;Yard Work;Occupation    Stability/Clinical Decision Making Stable/Uncomplicated    Rehab Potential Excellent    PT Frequency 3x / week    PT Duration 6 weeks    PT Treatment/Interventions ADLs/Self Care Home Management;DME Instruction;Gait training;Stair training;Functional mobility training;Therapeutic activities;Therapeutic exercise;Balance training;Patient/family education;Neuromuscular  re-education;Manual techniques;Passive range of motion    PT Next Visit Plan continue with TKR protocol    PT Home Exercise Plan QS, heel slide, knee extension prop    Consulted and Agree with Plan of Care Patient             Patient will benefit from skilled therapeutic intervention in order to improve the following deficits and impairments:  Abnormal gait, Decreased balance, Decreased mobility, Decreased range of motion, Decreased strength, Improper body mechanics, Pain  Visit Diagnosis: Difficulty in walking, not elsewhere classified  Muscle weakness (generalized)  Other symptoms and signs involving the musculoskeletal system     Problem List Patient Active Problem List   Diagnosis Date Noted   S/P TKR (total knee replacement), right 03/29/2021   Isthmic spondylolisthesis 06/14/2020   Polypharmacy 02/09/2020   Encounter for screening colonoscopy 02/09/2020   DDD (degenerative disc disease), lumbar 12/08/2019   S/P right knee arthroscopy 12/12/18 *with chondroplasty patella  12/19/2018   Borderline diabetes 07/02/2015   Vitamin D deficiency 07/02/2015   Lumbar back pain 07/02/2015   Insomnia 10/29/2013   Arthritis of knee, degenerative 10/13/2013   Hand dermatitis 03/02/2012   Essential hypertension, benign 09/21/2011   Class 2 obesity 09/21/2011   Shoulder pain, right 09/21/2011   Asthma, intermittent 09/21/2011   Tobacco user 09/21/2011    Toniann Fail, PT 05/04/2021, 9:42 AM  [facility] [place], [place], [address] Phone: [phone]   Fax:  [phone]  Name: Robin Sherman MRN: [id number] Date of Birth: Oct 31, 1960

## 2021-05-04 NOTE — Progress Notes (Signed)
Chief Complaint  Patient presents with   Post-op Follow-up    03/29/21 right total knee replacement/ has pain with therapy and at night but improving   Encounter Diagnoses  Name Primary?   S/P total knee replacement, right 03/29/21  Yes   Chronic pain of left knee      Procedure right total knee arthroplasty   Implants    DePuy   Attune, fixed-bearing posterior stabilized total knee: Sizes: Femur   4   tibia 4   patella 38   POLYthylene 6  Robin Sherman is doing well.  She would like her pain medicine refilled.  She now has 115 degrees of knee flexion she is happy with the right knee  Her left knee is starting to bother her.  She noticed that it bows out and is hurting on the medial side she asked for an injection  Her knee is amenable to injection and the following procedure was done  Procedure note  Injection  Verbal consent was obtained to inject the left knee joint  Timeout procedure was completed to confirm injection site  Diagnosis chronic pain with osteoarthritis left knee  Medications used Celestone Lidocaine 1% plain 3 cc  Anesthesia was provided by ethyl chloride spray  Prep was performed with alcohol  Technique of injection the knee was placed at 90 degrees of flexion a 21-gauge needle was used to inject through the lateral portal  No complications were noted

## 2021-05-06 ENCOUNTER — Other Ambulatory Visit: Payer: Self-pay

## 2021-05-06 ENCOUNTER — Ambulatory Visit (HOSPITAL_COMMUNITY): Payer: BC Managed Care – PPO

## 2021-05-06 DIAGNOSIS — M6281 Muscle weakness (generalized): Secondary | ICD-10-CM | POA: Diagnosis not present

## 2021-05-06 DIAGNOSIS — R29898 Other symptoms and signs involving the musculoskeletal system: Secondary | ICD-10-CM

## 2021-05-06 DIAGNOSIS — R262 Difficulty in walking, not elsewhere classified: Secondary | ICD-10-CM

## 2021-05-06 NOTE — Therapy (Signed)
[facility] [address] [place], [place], [address] Phone: [phone]   Fax:  [phone]  Physical Therapy Treatment and D/C Summary  Patient Details  Name: Robin Sherman MRN: [id number] Date of Birth: 1961-05-11 Referring Provider (PT): Arther Abbott  PHYSICAL THERAPY DISCHARGE SUMMARY  Visits from Start of Care: 9  Current functional level related to goals / functional outcomes: Able to meet STG/LTG   Remaining deficits: none   Education / Equipment: HEP   Patient agrees to discharge. Patient goals were met. Patient is being discharged due to meeting the stated rehab goals.  Encounter Date: 05/06/2021   PT End of Session - 05/06/21 0917     Visit Number 9    Number of Visits 18    Date for PT Re-Evaluation 05/30/21    Authorization Type [employer] PPO    Authorization - Visit Number 9    Authorization - Number of Visits 60    Progress Note Due on Visit 18    PT Start Time 0900    PT Stop Time 0945    PT Time Calculation (min) 45 min    Activity Tolerance Patient tolerated treatment well    Behavior During Therapy WFL for tasks assessed/performed             Past Medical History:  Diagnosis Date   Allergy    pollen   Arthritis    left knee   Asthma    Eczema    Followed by Dr. Nevada Crane dermatology   Headache(784.0) 09/25/2012   Hypertension    Neuromuscular disorder ([place])    Pre-diabetes    Sciatica of left side 10/13/2013   TRIGGER FINGER 11/03/2008   Qualifier: Diagnosis of  By: Aline Brochure MD, Dorothyann Peng      Past Surgical History:  Procedure Laterality Date   ABDOMINAL HYSTERECTOMY     bleeding   BILATERAL SALPINGECTOMY Bilateral 08/01/2013   Procedure: BILATERAL SALPINGECTOMY;  Surgeon: Jonnie Kind, MD;  Location: [facility];  Service: Gynecology;  Laterality: Bilateral;   CHONDROPLASTY Left 01/14/2014   Procedure: CHONDROPLASTY OF FEMUR;  Surgeon: Carole Civil, MD;  Location: [facility];  Service:  Orthopedics;  Laterality: Left;   CHONDROPLASTY Right 12/12/2018   Procedure: CHONDROPLASTY;  Surgeon: Carole Civil, MD;  Location: [facility];  Service: Orthopedics;  Laterality: Right;   COLONOSCOPY N/A 01/27/2013   Dr. Oneida Alar: Right colon with poor prep (patient ate [place] necks the night before the procedure).  Moderate diverticulosis in the sigmoid colon, moderate hemorrhoids.   COLONOSCOPY WITH PROPOFOL N/A 04/06/2020   Procedure: COLONOSCOPY WITH PROPOFOL;  Surgeon: Eloise Harman, DO;  [facility];  Service: Endoscopy;  Laterality: N/A;  8:15am   HEMATOMA EVACUATION N/A 08/03/2013   Procedure: EVACUATION PELVIC HEMATOMA;  Surgeon: Jonnie Kind, MD;  Location: [facility];  Service: Gynecology;  Laterality: N/A;   KNEE ARTHROSCOPY WITH MEDIAL MENISECTOMY Left 01/14/2014   Procedure: KNEE ARTHROSCOPY WITH PARTIAL MEDIAL MENISECTOMY;  Surgeon: Carole Civil, MD;  Location: [facility];  Service: Orthopedics;  Laterality: Left;   KNEE ARTHROSCOPY WITH MEDIAL MENISECTOMY Right 12/12/2018   Procedure: KNEE ARTHROSCOPY WITH MEDIAL MENISCECTOMY;  Surgeon: Carole Civil, MD;  Location: [facility];  Service: Orthopedics;  Laterality: Right;   POLYPECTOMY  04/06/2020   Procedure: POLYPECTOMY;  Surgeon: Eloise Harman, DO;  [facility];  Service: Endoscopy;;   SUPRACERVICAL ABDOMINAL HYSTERECTOMY N/A 08/01/2013   Procedure: HYSTERECTOMY SUPRACERVICAL ABDOMINAL;  Surgeon: Jonnie Kind, MD;  Location: [facility];  Service: Gynecology;  Laterality: N/A;   TOTAL KNEE ARTHROPLASTY Right 03/29/2021   Procedure: TOTAL KNEE ARTHROPLASTY;  Surgeon: Carole Civil, MD;  Location: [facility];  Service: Orthopedics;  Laterality: Right;   TRANSFORAMINAL LUMBAR INTERBODY FUSION W/ MIS 1 LEVEL Right 06/14/2020   Procedure: Right Lumbar Five- Sacral One Minimally invasive transforaminal lumbar interbody fusion;  Surgeon: Judith Part, MD;  Location: [facility];  Service: Neurosurgery;   Laterality: Right;  Right Lumbar Five- Sacral One Minimally invasive transforaminal lumbar interbody fusion   [place]    There were no vitals filed for this visit.   Subjective Assessment - 05/06/21 0916     Subjective LE is still sore and left knee has been bothering her and received cortisone shot to left knee at MD appointment. "Overall I'm feeling better and I have a good handle on the home exercises."    Currently in Pain? No/denies    Pain Score 0-No pain    Pain Orientation Right    Pain Type Surgical pain                [facility] PT Assessment - 05/06/21 0001       Assessment   Medical Diagnosis Right TKR      AROM   Right Knee Extension 0    Right Knee Flexion 115      Strength   Right Knee Flexion 5/5    Right Knee Extension 5/5      Transfers   Five time sit to stand comments  14      Ambulation/Gait   Ambulation/Gait Yes    Ambulation/Gait Assistance 7: Independent    Ambulation Distance (Feet) 400 Feet    Assistive device None    Gait Pattern Within Functional Limits    Ambulation Surface Level;Indoor    Stairs Yes    Stairs Assistance 7: Independent    Stair Management Technique One rail Right;Alternating pattern    Gait Comments 2MWT                                    PT Education - 05/06/21 0937     Education Details discussion regarding re-assessment findings and HEP maintenance    Person(s) Educated Patient    Methods Explanation    Comprehension Verbalized understanding              PT Short Term Goals - 05/06/21 0921       PT SHORT TERM GOAL #1   Title Pt will demo consistency and independence with her HEP to improve strength and mobility.    Baseline on-going    Time 3    Period Weeks    Status Achieved    Target Date 05/09/21      PT SHORT TERM GOAL #2   Title Demonstrate full right knee extension and 110 flexion to improve transfer and gait kinematics    Baseline full  extension, 114 degrees flexion    Time 3    Period Weeks    Status Achieved    Target Date 05/09/21      PT SHORT TERM GOAL #3   Title Demo improved LE strength and reduced risk for falls as evident by time of 12 sec 5xSTS    Baseline 22 sec at [facility], currently 14 sec  Time 3    Period Weeks    Status Not Met    Target Date 05/09/21               PT Long Term Goals - 05/06/21 0921       PT LONG TERM GOAL #1   Title Demo normalized, independent gait pattern x 300 ft during 2MWT    Baseline 150 ft with cane and antalgic pattern at start of care, currently 400 ft independent    Time 6    Period Weeks    Status Achieved      PT LONG TERM GOAL #2   Title Demo independent, reciprocal stair ambulation to improve indepedence and safety    Baseline step-to with HR and cane at start, now able to perform reciprocal single HR    Time 6    Period Weeks    Status Achieved      PT LONG TERM GOAL #3   Title Demo 5/5 RLE strength to improve stability and activity tolerance for work duties    Baseline 5/5 right knee    Time 6    Period Weeks    Status Achieved                   Plan - 05/06/21 5956     Clinical Impression Statement Demonstrates global improvements and able to meet STG/LTG except 5xSTS test time.  Pt able to ambulate with independent fashion and negotiate steps without difficulty.  5/5 right knee strength evident and demo good ROM return.  Pt expresses desire to progress on her own at this time for LE PRE and reports she will implement walking program at local school track and [employer] fitness program    Personal Factors and Comorbidities Time since onset of injury/illness/exacerbation    Examination-Activity Limitations Bend;Lift;Stand;Stairs;Squat;Locomotion Level;Transfers    Examination-Participation Restrictions Cleaning;Community Activity;Driving;Yard Work;Occupation    Stability/Clinical Decision Making Stable/Uncomplicated    Rehab Potential Excellent     PT Frequency 3x / week    PT Duration 6 weeks    PT Treatment/Interventions ADLs/Self Care Home Management;DME Instruction;Gait training;Stair training;Functional mobility training;Therapeutic activities;Therapeutic exercise;Balance training;Patient/family education;Neuromuscular re-education;Manual techniques;Passive range of motion    PT Next Visit Plan continue with TKR protocol    PT Home Exercise Plan QS, heel slide, knee extension prop    Consulted and Agree with Plan of Care Patient             Patient will benefit from skilled therapeutic intervention in order to improve the following deficits and impairments:  Abnormal gait, Decreased balance, Decreased mobility, Decreased range of motion, Decreased strength, Improper body mechanics, Pain  Visit Diagnosis: Difficulty in walking, not elsewhere classified  Muscle weakness (generalized)  Other symptoms and signs involving the musculoskeletal system     Problem List Patient Active Problem List   Diagnosis Date Noted   S/P TKR (total knee replacement), right 03/29/2021   Isthmic spondylolisthesis 06/14/2020   Polypharmacy 02/09/2020   Encounter for screening colonoscopy 02/09/2020   DDD (degenerative disc disease), lumbar 12/08/2019   S/P right knee arthroscopy 12/12/18 *with chondroplasty patella  12/19/2018   Borderline diabetes 07/02/2015   Vitamin D deficiency 07/02/2015   Lumbar back pain 07/02/2015   Insomnia 10/29/2013   Arthritis of knee, degenerative 10/13/2013   Hand dermatitis 03/02/2012   Essential hypertension, benign 09/21/2011   Class 2 obesity 09/21/2011   Shoulder pain, right 09/21/2011   Asthma, intermittent 09/21/2011   Tobacco user 09/21/2011  9:44 AM, 05/06/21 M. Sherlyn Lees, PT, DPT Physical Therapist- [place] Office Number: [phone]   [facility] [address] [place], [place], [address] Phone: [phone]   Fax:   [phone]  Name: Robin Sherman MRN: [id number] Date of Birth: 04-01-1961

## 2021-05-09 ENCOUNTER — Other Ambulatory Visit: Payer: Self-pay | Admitting: Orthopedic Surgery

## 2021-05-09 ENCOUNTER — Ambulatory Visit (HOSPITAL_COMMUNITY): Payer: BC Managed Care – PPO

## 2021-05-09 MED ORDER — OXYCODONE-ACETAMINOPHEN 5-325 MG PO TABS: 1.0000 | ORAL_TABLET | Freq: Four times a day (QID) | ORAL | 0 refills | Status: DC | PRN

## 2021-05-09 NOTE — Telephone Encounter (Signed)
Patient requests refill  oxyCODONE-acetaminophen (PERCOCET/ROXICET) 5-325 MG tablet 28 tablet      [facility]

## 2021-05-11 ENCOUNTER — Encounter (HOSPITAL_COMMUNITY): Payer: BC Managed Care – PPO

## 2021-05-13 ENCOUNTER — Encounter (HOSPITAL_COMMUNITY): Payer: BC Managed Care – PPO

## 2021-05-16 ENCOUNTER — Encounter (HOSPITAL_COMMUNITY): Payer: BC Managed Care – PPO

## 2021-05-18 ENCOUNTER — Encounter (HOSPITAL_COMMUNITY): Payer: BC Managed Care – PPO

## 2021-05-20 ENCOUNTER — Encounter (HOSPITAL_COMMUNITY): Payer: BC Managed Care – PPO

## 2021-05-23 ENCOUNTER — Encounter (HOSPITAL_COMMUNITY): Payer: BC Managed Care – PPO

## 2021-05-25 ENCOUNTER — Encounter (HOSPITAL_COMMUNITY): Payer: BC Managed Care – PPO

## 2021-05-26 ENCOUNTER — Other Ambulatory Visit: Payer: Self-pay | Admitting: Orthopedic Surgery

## 2021-05-26 MED ORDER — OXYCODONE-ACETAMINOPHEN 5-325 MG PO TABS: 1.0000 | ORAL_TABLET | Freq: Four times a day (QID) | ORAL | 0 refills | Status: DC | PRN

## 2021-05-26 NOTE — Telephone Encounter (Signed)
Patient came to office while we were closed during lunch and just called to request refill, status/post surgery: oxyCODONE-acetaminophen (PERCOCET/ROXICET) 5-325 MG tablet 28 tablet     [place] [facility]     (Aware to call prior to noon on Thursdays - said was trying to catch [place])

## 2021-05-27 ENCOUNTER — Encounter (HOSPITAL_COMMUNITY): Payer: BC Managed Care – PPO

## 2021-05-30 ENCOUNTER — Encounter (HOSPITAL_COMMUNITY): Payer: BC Managed Care – PPO

## 2021-06-01 ENCOUNTER — Encounter (HOSPITAL_COMMUNITY): Payer: BC Managed Care – PPO

## 2021-06-03 ENCOUNTER — Encounter (HOSPITAL_COMMUNITY): Payer: BC Managed Care – PPO

## 2021-06-15 ENCOUNTER — Encounter: Payer: Self-pay | Admitting: Orthopedic Surgery

## 2021-06-15 ENCOUNTER — Ambulatory Visit (INDEPENDENT_AMBULATORY_CARE_PROVIDER_SITE_OTHER): Payer: BC Managed Care – PPO | Admitting: Orthopedic Surgery

## 2021-06-15 ENCOUNTER — Other Ambulatory Visit: Payer: Self-pay

## 2021-06-15 DIAGNOSIS — M5416 Radiculopathy, lumbar region: Secondary | ICD-10-CM | POA: Diagnosis not present

## 2021-06-15 DIAGNOSIS — Z96651 Presence of right artificial knee joint: Secondary | ICD-10-CM

## 2021-06-15 MED ORDER — GABAPENTIN 400 MG PO CAPS: 400.0000 mg | ORAL_CAPSULE | Freq: Three times a day (TID) | ORAL | 1 refills | Status: DC

## 2021-06-15 MED ORDER — HYDROCODONE-ACETAMINOPHEN 10-325 MG PO TABS: 1.0000 | ORAL_TABLET | Freq: Three times a day (TID) | ORAL | 0 refills | Status: AC | PRN

## 2021-06-15 MED ORDER — PREDNISONE 10 MG PO TABS: 10.0000 mg | ORAL_TABLET | Freq: Two times a day (BID) | ORAL | 0 refills | Status: DC

## 2021-06-15 NOTE — Progress Notes (Addendum)
POST OP [facility]  Chief Complaint  Patient presents with   Post-op Follow-up    Total knee replaced right 03/29/21 improving   Knee Pain    Has left knee pain     Encounter Diagnosis  Name Primary?   S/P total knee replacement, right 03/29/21  Yes   Robin Sherman is still within the postop period of her right total knee she loves her knee she says it feels great however she has radicular pain in her left leg.  This is postop day #78 from the right total knee  She complains of pain radiating down her left leg she is already on gabapentin 300 mg twice a day she only takes it once a day  She would like some more pain medication.  She has been on oxycodone for pain  Obviously this could develop into a problem  So I am going to recommend gabapentin 400 mg up to 3 times a day  Norco 10 mg every 6 hours as needed for pain and prednisone 10 mg twice daily  I have her scheduled for 59-month follow-up but sooner if this radiculopathy continues  Meds ordered this encounter  Medications   gabapentin (NEURONTIN) 400 MG capsule    Sig: Take 1 capsule (400 mg total) by mouth 3 (three) times daily.    Dispense:  60 capsule    Refill:  1   predniSONE (DELTASONE) 10 MG tablet    Sig: Take 1 tablet (10 mg total) by mouth 2 (two) times daily with a meal.    Dispense:  42 tablet    Refill:  0   HYDROcodone-acetaminophen (NORCO) 10-325 MG tablet    Sig: Take 1 tablet by mouth every 8 (eight) hours as needed for up to 7 days.    Dispense:  21 tablet    Refill:  0

## 2021-06-15 NOTE — Patient Instructions (Signed)
RET TO WORK

## 2021-06-20 ENCOUNTER — Telehealth: Payer: Self-pay | Admitting: Radiology

## 2021-06-20 ENCOUNTER — Telehealth: Payer: Self-pay | Admitting: Orthopedic Surgery

## 2021-06-20 NOTE — Telephone Encounter (Signed)
Per patient request faxed form to [employer], employer/ represented by [employer], fitness for duty form to  fax [phone]; patient aware.

## 2021-06-20 NOTE — Telephone Encounter (Signed)
I called patient LM asking her to call and let me know where to send/fax or if she needed to pickup the Fitness For Duty form.  Form is on my desk.

## 2021-07-05 DIAGNOSIS — E1165 Type 2 diabetes mellitus with hyperglycemia: Secondary | ICD-10-CM | POA: Diagnosis not present

## 2021-07-05 DIAGNOSIS — M25552 Pain in left hip: Secondary | ICD-10-CM | POA: Diagnosis not present

## 2021-07-28 DIAGNOSIS — M25462 Effusion, left knee: Secondary | ICD-10-CM | POA: Diagnosis not present

## 2021-07-28 DIAGNOSIS — M25662 Stiffness of left knee, not elsewhere classified: Secondary | ICD-10-CM | POA: Diagnosis not present

## 2021-07-28 DIAGNOSIS — R2689 Other abnormalities of gait and mobility: Secondary | ICD-10-CM | POA: Diagnosis not present

## 2021-07-28 DIAGNOSIS — R531 Weakness: Secondary | ICD-10-CM | POA: Diagnosis not present

## 2021-08-01 DIAGNOSIS — M25462 Effusion, left knee: Secondary | ICD-10-CM | POA: Diagnosis not present

## 2021-08-01 DIAGNOSIS — R2689 Other abnormalities of gait and mobility: Secondary | ICD-10-CM | POA: Diagnosis not present

## 2021-08-01 DIAGNOSIS — M25662 Stiffness of left knee, not elsewhere classified: Secondary | ICD-10-CM | POA: Diagnosis not present

## 2021-08-01 DIAGNOSIS — R531 Weakness: Secondary | ICD-10-CM | POA: Diagnosis not present

## 2021-08-04 DIAGNOSIS — E1165 Type 2 diabetes mellitus with hyperglycemia: Secondary | ICD-10-CM | POA: Diagnosis not present

## 2021-08-04 DIAGNOSIS — M1990 Unspecified osteoarthritis, unspecified site: Secondary | ICD-10-CM | POA: Diagnosis not present

## 2021-08-04 DIAGNOSIS — I10 Essential (primary) hypertension: Secondary | ICD-10-CM | POA: Diagnosis not present

## 2021-08-10 ENCOUNTER — Other Ambulatory Visit: Payer: Self-pay | Admitting: Orthopedic Surgery

## 2021-08-10 DIAGNOSIS — M25662 Stiffness of left knee, not elsewhere classified: Secondary | ICD-10-CM | POA: Diagnosis not present

## 2021-08-10 DIAGNOSIS — M25462 Effusion, left knee: Secondary | ICD-10-CM | POA: Diagnosis not present

## 2021-08-10 DIAGNOSIS — R2689 Other abnormalities of gait and mobility: Secondary | ICD-10-CM | POA: Diagnosis not present

## 2021-08-10 DIAGNOSIS — R531 Weakness: Secondary | ICD-10-CM | POA: Diagnosis not present

## 2021-08-11 DIAGNOSIS — R531 Weakness: Secondary | ICD-10-CM | POA: Diagnosis not present

## 2021-08-11 DIAGNOSIS — R2689 Other abnormalities of gait and mobility: Secondary | ICD-10-CM | POA: Diagnosis not present

## 2021-08-11 DIAGNOSIS — M25462 Effusion, left knee: Secondary | ICD-10-CM | POA: Diagnosis not present

## 2021-08-11 DIAGNOSIS — R509 Fever, unspecified: Secondary | ICD-10-CM | POA: Diagnosis not present

## 2021-08-11 DIAGNOSIS — R0981 Nasal congestion: Secondary | ICD-10-CM | POA: Diagnosis not present

## 2021-08-11 DIAGNOSIS — R519 Headache, unspecified: Secondary | ICD-10-CM | POA: Diagnosis not present

## 2021-08-11 DIAGNOSIS — M25662 Stiffness of left knee, not elsewhere classified: Secondary | ICD-10-CM | POA: Diagnosis not present

## 2021-08-11 DIAGNOSIS — Z0001 Encounter for general adult medical examination with abnormal findings: Secondary | ICD-10-CM | POA: Diagnosis not present

## 2021-08-15 DIAGNOSIS — R531 Weakness: Secondary | ICD-10-CM | POA: Diagnosis not present

## 2021-08-15 DIAGNOSIS — M25662 Stiffness of left knee, not elsewhere classified: Secondary | ICD-10-CM | POA: Diagnosis not present

## 2021-08-15 DIAGNOSIS — M25462 Effusion, left knee: Secondary | ICD-10-CM | POA: Diagnosis not present

## 2021-08-15 DIAGNOSIS — R2689 Other abnormalities of gait and mobility: Secondary | ICD-10-CM | POA: Diagnosis not present

## 2021-09-12 ENCOUNTER — Other Ambulatory Visit: Payer: Self-pay

## 2021-09-12 ENCOUNTER — Ambulatory Visit: Payer: BC Managed Care – PPO | Admitting: Orthopedic Surgery

## 2021-09-12 ENCOUNTER — Encounter: Payer: Self-pay | Admitting: Orthopedic Surgery

## 2021-09-12 DIAGNOSIS — M76891 Other specified enthesopathies of right lower limb, excluding foot: Secondary | ICD-10-CM | POA: Diagnosis not present

## 2021-09-12 DIAGNOSIS — Z96651 Presence of right artificial knee joint: Secondary | ICD-10-CM

## 2021-09-12 NOTE — Patient Instructions (Signed)
Patient needs to sign release for report of MRI knee done at [facility] for her left knee

## 2021-09-12 NOTE — Progress Notes (Signed)
Chief Complaint  Patient presents with   Leg Pain    States had WC injury and was treated at Urgent care / has already settled claim released today left leg injury    Knee Pain    Has had swelling right knee s/p knee replacement 03/29/21   Robin Sherman had a right total knee in September she complains of pain at the distal medial portion of her incision she says its been swelling and hurting and very tender  On a side note she had a twist and fall injured left knee at work.  It was filed under [employer]  She had an MRI of the left knee.  She said it showed arthritis and she was released back to work  Right knee incision looks clean there is no redness or erythema.  Just medial to the distal portion there is tenderness at the pes tendon insertion.  She can fully extend the knee with no discomfort her flexion is good and her knee is stable   Encounter Diagnoses  Name Primary?   S/P total knee replacement, right 03/29/21  Yes   Pes anserinus tendonitis of right lower extremity    Recommend injection pes tendons  Procedure note right knee injection for bursitis   verbal consent was obtained to inject right knee PES BURSA  Timeout was completed to confirm the site of injection  The medications used were 40 mg of Depo-Medrol and 1% lidocaine 3 cc  Anesthesia was provided by ethyl chloride and the skin was prepped with alcohol.  After cleaning the skin with alcohol a 25-gauge needle was used to inject the right knee bursa.  There were no complications and a sterile bandage was applied    Apply ice and topical medications as needed follow-up as needed

## 2021-10-10 ENCOUNTER — Other Ambulatory Visit: Payer: Self-pay

## 2021-10-10 ENCOUNTER — Ambulatory Visit: Payer: BC Managed Care – PPO | Admitting: Orthopedic Surgery

## 2021-10-10 DIAGNOSIS — M25562 Pain in left knee: Secondary | ICD-10-CM | POA: Diagnosis not present

## 2021-10-11 NOTE — Progress Notes (Signed)
FOLLOW UP  ? ?Encounter Diagnosis  ?Name Primary?  ? Acute pain of left knee Yes  ? ? ? ?Chief Complaint  ?Patient presents with  ? Knee Pain  ?  LEFT knee ?Patient request injection  ? Injections  ?  LEFT knee  ? ? ? ?Robin Sherman was seen after an injury at work by urgent care she eventually had an MRI that showed arthritis of the knee she comes in complaining of pain wondering if she can get an injection ? ?I have not evaluated the left knee so we will have to do so before advising injection ? ?Further history tells [place] that she was seen at the urgent care they said she needed an MRI and they gave her some medication to take it did not help so she had the MRI once the MRI came back as arthritis they told her she could go back to work which she did she still having severe pain in the left knee with no real mechanical symptoms of giving way or locking ? ?The MRI was read as arthritis without any surgical tear of the meniscus ? ?Recommend injection left knee come back in 2 weeks if no improvement review everything again and see if we can come up with diagnosis ? ?Procedure note left knee injection  ? ?verbal consent was obtained to inject left knee joint ? ?Timeout was completed to confirm the site of injection ? ?The medications used were depomedrol 40 mg and 1% lidocaine 3 cc ?Anesthesia was provided by ethyl chloride and the skin was prepped with alcohol. ? ?After cleaning the skin with alcohol a 20-gauge needle was used to inject the left knee joint. There were no complications. A sterile bandage was applied. ?  ?

## 2021-10-24 ENCOUNTER — Ambulatory Visit: Payer: BC Managed Care – PPO | Admitting: Orthopedic Surgery

## 2021-10-24 DIAGNOSIS — M23307 Other meniscus derangements, unspecified meniscus, left knee: Secondary | ICD-10-CM | POA: Diagnosis not present

## 2021-10-24 DIAGNOSIS — M25562 Pain in left knee: Secondary | ICD-10-CM

## 2021-10-24 DIAGNOSIS — M1712 Unilateral primary osteoarthritis, left knee: Secondary | ICD-10-CM | POA: Diagnosis not present

## 2021-10-24 NOTE — Progress Notes (Addendum)
FOLLOW UP  ? ?Encounter Diagnoses  ?Name Primary?  ? Acute pain of left knee Yes  ? Arthritis of left knee   ? Degenerative tear of meniscus of left knee   ? ? ? ?Chief Complaint  ?Patient presents with  ? Follow-up  ?  Recheck on left knee  ? ?[place] was seen after an injury at work by urgent care she eventually had an MRI that showed arthritis of the knee she comes in complaining of pain wondering if she can get an injection ? ?I have not evaluated the left knee so we will have to do so before advising injection ? ?Further history tells [place] that she was seen at the urgent care they said she needed an MRI and they gave her some medication to take it did not help so she had the MRI once the MRI came back as arthritis they told her she could go back to work which she did she still having severe pain in the left knee with no real mechanical symptoms of giving way or locking ? ?The MRI was read as arthritis without any surgical tear of the meniscus ? ?Recommend injection left knee come back in 2 weeks if no improvement review everything again and see if we can come up with diagnosis ?  ? ?As noted above Luma had a Worker's Compensation related injury she was seen by it looks like urgent care they eventually did an MRI there was no surgical tear of the meniscus from what I remember reading from the report ? ?She is still having symptoms in the knee medially with slipping sensation and tenderness along the medial joint line her range of motion does not appear to be affected ? ?She will see the urgent care person again with the understanding that we have recommended that she have arthroscopic surgery on this knee she has not gotten any better and her meniscal changes might warrant partial meniscectomy. ? ?Procedure note left knee injection  ? ?verbal consent was obtained to inject left knee joint ? ?Timeout was completed to confirm the site of injection ? ?The medications used were depomedrol 40 mg and 1% lidocaine 3  cc ?Anesthesia was provided by ethyl chloride and the skin was prepped with alcohol. ? ?After cleaning the skin with alcohol a 20-gauge needle was used to inject the left knee joint. There were no complications. A sterile bandage was applied. ?  ?

## 2021-11-11 DIAGNOSIS — E1165 Type 2 diabetes mellitus with hyperglycemia: Secondary | ICD-10-CM | POA: Diagnosis not present

## 2021-11-11 DIAGNOSIS — M1990 Unspecified osteoarthritis, unspecified site: Secondary | ICD-10-CM | POA: Diagnosis not present

## 2021-11-26 IMAGING — RF DG C-ARM 1-60 MIN
1 series · 2 of 2 positions shown · non-contrast
Comparison: CT lumbar spine dated April 23, 2019.

CLINICAL DATA: L5-S1 fusion.

EXAM:
LUMBAR SPINE - 2-3 VIEW; DG C-ARM 1-60 MIN

[Series 1: run · 2 of 2 slices shown]
[im 1/2]
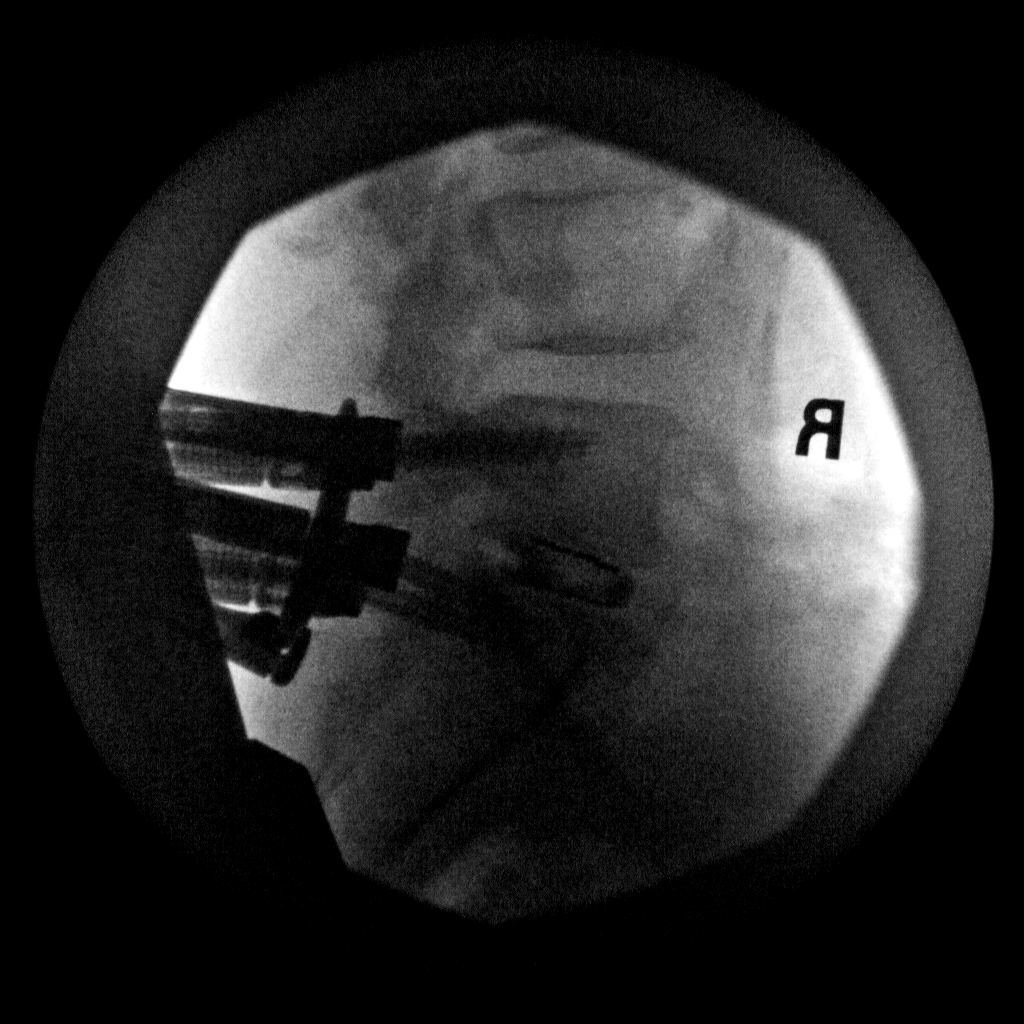
[im 2/2]
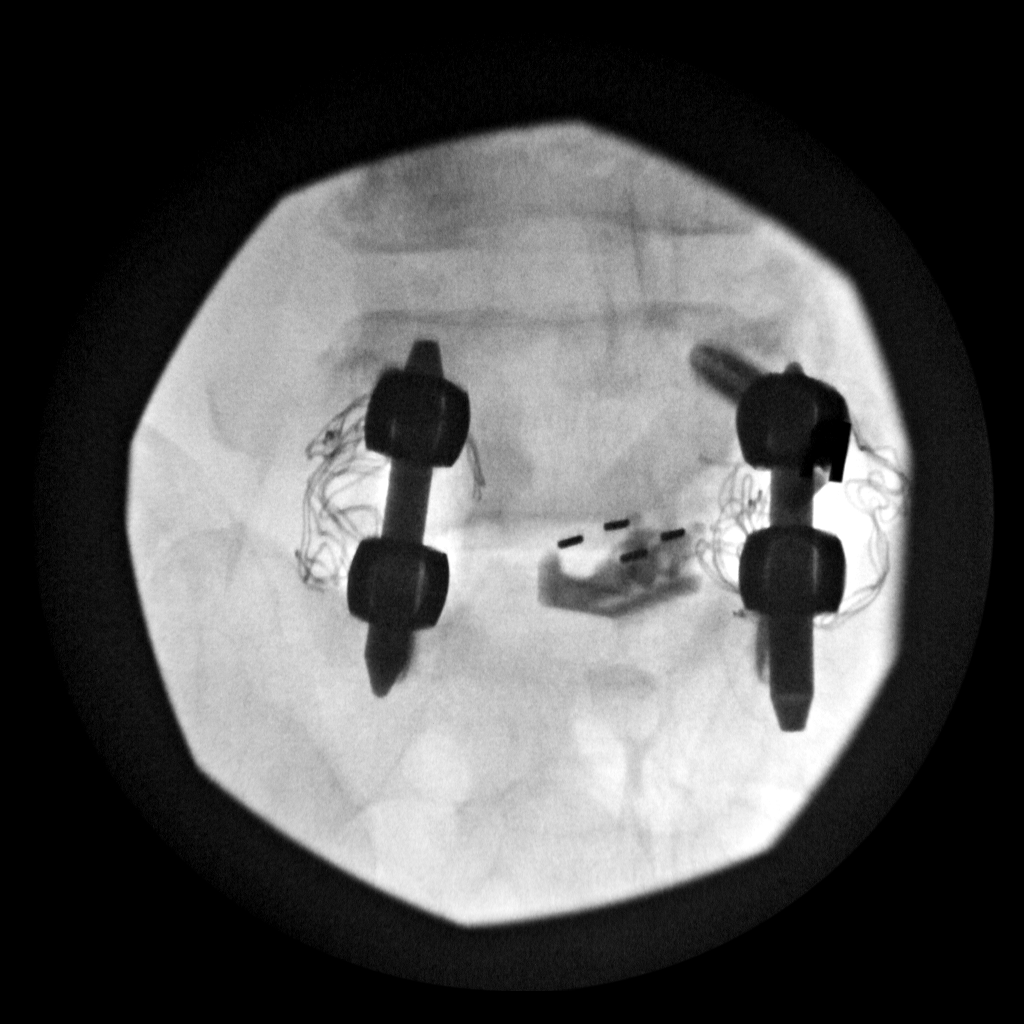

[2 of 2 positions shown; findings below may reference images not displayed]

FLUOROSCOPY TIME:  166.8 seconds.

C-arm fluoroscopic images were obtained intraoperatively and
submitted for post operative interpretation.
FINDINGS: Frontal and lateral intraoperative fluoroscopic images demonstrate
interval L5-S1 posterior and interbody fusion. No acute osseous
abnormality.
IMPRESSION: Intraoperative fluoroscopic guidance for L5-S1 fusion.

## 2021-12-21 DIAGNOSIS — D2272 Melanocytic nevi of left lower limb, including hip: Secondary | ICD-10-CM | POA: Diagnosis not present

## 2021-12-21 DIAGNOSIS — L308 Other specified dermatitis: Secondary | ICD-10-CM | POA: Diagnosis not present

## 2021-12-21 DIAGNOSIS — D485 Neoplasm of uncertain behavior of skin: Secondary | ICD-10-CM | POA: Diagnosis not present

## 2021-12-21 DIAGNOSIS — L814 Other melanin hyperpigmentation: Secondary | ICD-10-CM | POA: Diagnosis not present

## 2021-12-21 DIAGNOSIS — D225 Melanocytic nevi of trunk: Secondary | ICD-10-CM | POA: Diagnosis not present

## 2021-12-21 DIAGNOSIS — Z1283 Encounter for screening for malignant neoplasm of skin: Secondary | ICD-10-CM | POA: Diagnosis not present

## 2021-12-30 DIAGNOSIS — M25559 Pain in unspecified hip: Secondary | ICD-10-CM | POA: Diagnosis not present

## 2021-12-30 DIAGNOSIS — M25562 Pain in left knee: Secondary | ICD-10-CM | POA: Diagnosis not present

## 2021-12-30 DIAGNOSIS — Z6838 Body mass index (BMI) 38.0-38.9, adult: Secondary | ICD-10-CM | POA: Diagnosis not present

## 2021-12-30 DIAGNOSIS — M4310 Spondylolisthesis, site unspecified: Secondary | ICD-10-CM | POA: Diagnosis not present

## 2022-02-10 DIAGNOSIS — E1165 Type 2 diabetes mellitus with hyperglycemia: Secondary | ICD-10-CM | POA: Diagnosis not present

## 2022-02-10 DIAGNOSIS — E782 Mixed hyperlipidemia: Secondary | ICD-10-CM | POA: Diagnosis not present

## 2022-02-18 DIAGNOSIS — E669 Obesity, unspecified: Secondary | ICD-10-CM | POA: Diagnosis not present

## 2022-02-18 DIAGNOSIS — M1990 Unspecified osteoarthritis, unspecified site: Secondary | ICD-10-CM | POA: Diagnosis not present

## 2022-02-18 DIAGNOSIS — I10 Essential (primary) hypertension: Secondary | ICD-10-CM | POA: Diagnosis not present

## 2022-02-18 DIAGNOSIS — E1165 Type 2 diabetes mellitus with hyperglycemia: Secondary | ICD-10-CM | POA: Diagnosis not present

## 2022-04-14 ENCOUNTER — Other Ambulatory Visit (HOSPITAL_COMMUNITY): Payer: Self-pay | Admitting: Internal Medicine

## 2022-04-14 DIAGNOSIS — Z1231 Encounter for screening mammogram for malignant neoplasm of breast: Secondary | ICD-10-CM

## 2022-04-20 ENCOUNTER — Ambulatory Visit (HOSPITAL_COMMUNITY)
Admission: RE | Admit: 2022-04-20 | Discharge: 2022-04-20 | Disposition: A | Discharge status: Home / Self Care | Payer: BC Managed Care – PPO | Source: Ambulatory Visit | Attending: Internal Medicine | Admitting: Internal Medicine

## 2022-04-20 DIAGNOSIS — Z1231 Encounter for screening mammogram for malignant neoplasm of breast: Secondary | ICD-10-CM | POA: Insufficient documentation

## 2022-06-06 ENCOUNTER — Other Ambulatory Visit (HOSPITAL_COMMUNITY): Payer: Self-pay | Admitting: Family Medicine

## 2022-06-06 DIAGNOSIS — M1990 Unspecified osteoarthritis, unspecified site: Secondary | ICD-10-CM | POA: Diagnosis not present

## 2022-06-06 DIAGNOSIS — R109 Unspecified abdominal pain: Secondary | ICD-10-CM

## 2022-06-06 DIAGNOSIS — I10 Essential (primary) hypertension: Secondary | ICD-10-CM | POA: Diagnosis not present

## 2022-06-06 DIAGNOSIS — L309 Dermatitis, unspecified: Secondary | ICD-10-CM | POA: Diagnosis not present

## 2022-06-07 ENCOUNTER — Ambulatory Visit (HOSPITAL_COMMUNITY)
Admission: RE | Admit: 2022-06-07 | Discharge: 2022-06-07 | Disposition: A | Discharge status: Home / Self Care | Payer: BC Managed Care – PPO | Source: Ambulatory Visit | Attending: Family Medicine | Admitting: Family Medicine

## 2022-06-07 DIAGNOSIS — R109 Unspecified abdominal pain: Secondary | ICD-10-CM | POA: Diagnosis not present

## 2022-06-14 DIAGNOSIS — M25562 Pain in left knee: Secondary | ICD-10-CM | POA: Insufficient documentation

## 2022-06-15 ENCOUNTER — Encounter: Payer: Self-pay | Admitting: Orthopedic Surgery

## 2022-06-15 ENCOUNTER — Ambulatory Visit (INDEPENDENT_AMBULATORY_CARE_PROVIDER_SITE_OTHER): Payer: BC Managed Care – PPO

## 2022-06-15 ENCOUNTER — Ambulatory Visit: Payer: BC Managed Care – PPO | Admitting: Orthopedic Surgery

## 2022-06-15 VITALS — BP 169/91 | HR 91

## 2022-06-15 DIAGNOSIS — Z01818 Encounter for other preprocedural examination: Secondary | ICD-10-CM

## 2022-06-15 DIAGNOSIS — M25562 Pain in left knee: Secondary | ICD-10-CM

## 2022-06-15 DIAGNOSIS — M1712 Unilateral primary osteoarthritis, left knee: Secondary | ICD-10-CM | POA: Diagnosis not present

## 2022-06-15 DIAGNOSIS — G8929 Other chronic pain: Secondary | ICD-10-CM | POA: Diagnosis not present

## 2022-06-15 MED ORDER — BUPIVACAINE-MELOXICAM ER 400-12 MG/14ML IJ SOLN: 400.0000 mg | Freq: Once | INTRAMUSCULAR | Status: DC

## 2022-06-15 NOTE — Progress Notes (Signed)
Follow-up visit  Chief Complaint  Patient presents with   Knee Pain    Left    Leg Problem    Right lower leg tingling     61 year old female presents with left leg pain status post injury at work also has left knee pain  Patient minimal requiring oxycodone 10 mg up to 3 times a day although she tries to take Tylenol and BC powders instead  She has left leg pain right leg numbness and tingling in her toes she has a giving way symptoms in the left knee  She has had a right total knee replacement and did not have to take this type of opioid even with the knee replacement so I do not think this is just arthritis  She has lower back pain that radiates to her left knee as well.  Dr Zada Finders saw her but held her back was "fine"   She did get an injection on October 24, 2021 it did not seem like it helped the knee pain  I m going to repeat her knee x-ray  I looked at her MRI from 2020 involving her lumbar spine and that showed Disc levels:   T11-12 and T12-L1 are imaged in the sagittal plane only and negative.   L1-2: Shallow disc bulge and mild facet degenerative change. No stenosis.   L2-3: Broad-based disc bulge results in narrowing in the subarticular recesses which could impact either descending L3 root. The disc also causes mild to moderate bilateral foraminal narrowing, worse on the left.   L3-4: Moderate facet arthropathy. Shallow broad-based right paracentral protrusion causes mild narrowing in the right subarticular recess. Neural foramina are open.   L4-5: Moderate facet arthropathy and a shallow broad-based disc bulge without stenosis.   L5-S1: Mild-to-moderate facet degenerative change. Shallow broad-based disc bulge is more prominent to the right extending into the foramen. There is moderate right foraminal narrowing. The central canal and left foramen are open. Prominent epidural fat noted.   IMPRESSION: Disc bulge L2-3 causes narrowing in the  subarticular recesses which could impact either descending L3 root. There is also mild to moderate bilateral foraminal narrowing at L2-3, worse on the left.   Mild narrowing in the right subarticular recess at L3-4 due to a shallow right paracentral protrusion.   Moderate right foraminal narrowing at L5-S1 due to a combination disc and facet arthropathy. The     Electronically Signed   By: Inge Rise M.D.   On: 03/04/2019 18:52    Robin Sherman has agreed to proceed with the left total knee replacement.  We emphasized that her spine is still causing a significant amount of her radicular symptoms from the back to the knee and this will not be alleviated by the surgical procedure  She is encouraged to see Dr. Venetia Constable again to perhaps get some epidural injections prior to surgery  X-rays today show severe arthritis of the left knee with varus deformity she has severe sclerosis and subchondral bone changes and grade 4 narrowing of the medial compartment the knee is in varus as stated

## 2022-06-15 NOTE — Patient Instructions (Addendum)
Your surgery will be at [facility] by Dr Aline Brochure  plan to be in hospital overnight. Jan 16th The hospital will contact you with a preoperative appointment to discuss Anesthesia.  Please arrive on time or 15 minutes early for the preoperative appointment, they have a very tight schedule if you are late or do not come in your surgery will be cancelled.  The phone number for the preop area is [phone]. Please bring your medications with you for the appointment. They will tell you the arrival time for surgery and medication instructions when you have your preoperative evaluation. Do not wear nail polish the day of your surgery and if you take Phentermine you need to stop this medication ONE WEEK prior to your surgery. f you take Augustina Mood, Jardiance, or Steglatro) - Hold 72 hours before the procedure.  If you take Ozempic,  Bydureon or Trulicity do not take for 8 days before your surgery. If you take Victoza, Rybelsis, Saxenda or Adlyxi stop 24 hours before the procedure. Please arrive at the hospital 2 hours before procedure if scheduled at 9:30 or later in the day or at the time the nurse tells you at your preoperative visit.   If you have my chart do not use the time given in my chart use the time given to you by the nurse during your preoperative visit.   Your surgery  time may change. Please be available for phone calls the day of your surgery and the day before. The Short Stay department may need to discuss changes about your surgery time. Not reaching the you could lead to procedure delays and possible cancellation.  You must have a ride home and someone to stay with you for 24 to 48 hours. The person taking you home will receive and sign for the your discharge instructions.  Please be prepared to give your support person's name and telephone number to [employer]. Dr Aline Brochure will need that name and phone number post procedure.   You will also get a call from a  representative of [employer], they have a machine that you will use in the first few weeks after surgery. It is called a CPM.   You will have home physical therapy for 2 weeks after surgery, the home health agency will call you before or just following the surgery to set up visits. [facility] is the agency we normally use, unless you request another agency.   You will get a call also from outpatient therapy for therapy starting when the home therapy is done.  If you have questions or need to Reschedule the surgery, call the office ask for [place].     You have decided to proceed with knee replacement surgery. You have decided not to continue with nonoperative measures such as but not limited to oral medication, weight loss, activity modification, physical therapy, bracing, or injection.  We will perform the procedure commonly known as total knee replacement. Some of the risks associated with knee replacement surgery include but are not limited to Bleeding Infection Swelling Stiffness Blood clot Pulmonary embolism  Loosening of the implant Pain that persists even after surgery  Infection is especially devastating complication of knee surgery although rare. If infection does occur your implant will usually have to be removed and several surgeries and antibiotics will be needed to eradicate the infection prior to performing a repeat replacement.   In some cases amputation is required to eradicate the infection. In other rare cases a  knee fusion is needed   In compliance with recent [place] law in federal regulation regarding opioid use and abuse and addiction, we will taper (stop) opioid medication after 2 weeks.  If you're not comfortable with these risks and would like to continue with nonoperative treatment please let Dr. Aline Brochure know prior to your surgery.

## 2022-06-22 ENCOUNTER — Other Ambulatory Visit: Payer: Self-pay | Admitting: Orthopedic Surgery

## 2022-06-22 DIAGNOSIS — M1712 Unilateral primary osteoarthritis, left knee: Secondary | ICD-10-CM

## 2022-07-04 ENCOUNTER — Encounter: Payer: Self-pay | Admitting: Orthopedic Surgery

## 2022-07-04 DIAGNOSIS — Z0289 Encounter for other administrative examinations: Secondary | ICD-10-CM

## 2022-07-13 ENCOUNTER — Telehealth: Payer: Self-pay | Admitting: Orthopedic Surgery

## 2022-07-13 ENCOUNTER — Encounter: Payer: Self-pay | Admitting: Orthopedic Surgery

## 2022-07-13 DIAGNOSIS — M5416 Radiculopathy, lumbar region: Secondary | ICD-10-CM | POA: Diagnosis not present

## 2022-07-13 NOTE — Telephone Encounter (Signed)
Patient is here right now, requesting a work note.  She is scheduled for surgery 08/01/22 and she is needing a note stating when she needs to come out of work and when she is to return to work.  Phone # [phone]

## 2022-07-19 ENCOUNTER — Other Ambulatory Visit: Payer: Self-pay | Admitting: Orthopedic Surgery

## 2022-07-19 DIAGNOSIS — M1712 Unilateral primary osteoarthritis, left knee: Secondary | ICD-10-CM

## 2022-07-25 DIAGNOSIS — M5416 Radiculopathy, lumbar region: Secondary | ICD-10-CM | POA: Diagnosis not present

## 2022-07-25 NOTE — Patient Instructions (Signed)
Robin Sherman  07/25/2022     '@[email or web address]'$ @   Your procedure is scheduled on  08/01/2022.   Report to [facility] at  0600  A.M.   Call this number if you have problems the morning of surgery:  [phone]  If you experience any cold or flu symptoms such as cough, fever, chills, shortness of breath, etc. between now and your scheduled surgery, please notify us at the above number.   Remember:  Do not eat or drink after midnight.        Use your inhaler before you come and bring your rescue inhaler with you.     Take these medicines the morning of surgery with A SIP OF WATER                     gabapentin, oxycodone(if needed).    Do not wear jewelry, make-up or nail polish.  Do not wear lotions, powders, or perfumes, or deodorant.  Do not shave 48 hours prior to surgery.  Men may shave face and neck.  Do not bring valuables to the hospital.  [facility] is not responsible for any belongings or valuables.  Contacts, dentures or bridgework may not be worn into surgery.  Leave your suitcase in the car.  After surgery it may be brought to your room.  For patients admitted to the hospital, discharge time will be determined by your treatment team.  Patients discharged the day of surgery will not be allowed to drive home and must have someone with them for 24 hours.    Special instructions:   DO NOT smoke tobacco or vape for 24 hours before your procedure.  Please read over the following fact sheets that you were given. Pain Booklet, Coughing and Deep Breathing, Blood Transfusion Information, Surgical Site Infection Prevention, Anesthesia Post-op Instructions, and Care and Recovery After Surgery      Total Knee Replacement, Care After This sheet gives you information about how to care for yourself after your procedure. Your health care provider may also give you more specific instructions. If you have problems or questions, contact your health care  provider. What can I expect after the procedure? After the procedure, it is common to have: Redness, pain, and swelling at the incision area. Stiffness. Discomfort. A small amount of blood or clear fluid coming from your incision. Follow these instructions at home: Medicines Take over-the-counter and prescription medicines only as told by your health care provider. If you were prescribed a blood thinner (anticoagulant), take it as told by your health care provider. Ask your health care provider if the medicine prescribed to you: Requires you to avoid driving or using machinery. Can cause constipation. You may need to take these actions to prevent or treat constipation: Drink enough fluid to keep your urine pale yellow. Take over-the-counter or prescription medicines. Eat foods that are high in fiber, such as beans, whole grains, and fresh fruits and vegetables. Limit foods that are high in fat and processed sugars, such as fried or sweet foods. Incision care  Follow instructions from your health care provider about how to take care of your incision. Make sure you: Wash your hands with soap and water for at least 20 seconds before and after you change your bandage (dressing). If soap and water are not available, use hand sanitizer. Change your dressing as told by your health care provider. Leave stitches (sutures), staples, skin glue, or adhesive  strips in place. These skin closures may need to stay in place for 2 weeks or longer. If adhesive strip edges start to loosen and curl up, you may trim the loose edges. Do not remove adhesive strips completely unless your health care provider tells you to do that. Do not take baths, swim, or use a hot tub until your health care provider approves. Check your incision area every day for signs of infection. Check for: More redness, swelling, or pain. More fluid or blood. Warmth. Pus or a bad smell. Activity Rest as told by your health care  provider. Avoid sitting for a long time without moving. Get up to take short walks every 1-2 hours. This is important to improve blood flow and breathing. Ask for help if you feel weak or unsteady. Follow instructions from your health care provider about using a walker, crutches, or a cane. You may use your legs to support (bear) your body weight as told by your health care provider. Follow instructions about how much weight you may safely support on your affected leg (weight-bearing restrictions). A physical therapist may show you how to get out of a bed and chair and how to go up and down stairs. You will first do this with a walker, crutches, or a cane and then without any of these devices. Once you are able to walk without a limp, you may stop using a walker, crutches, or a cane. Do exercises as told by your health care provider or physical therapist. Avoid high-impact activities, including running, jumping rope, and doing jumping jacks. Do not play contact sports until your health care provider approves. Return to your normal activities as told by your health care provider. Ask your health care provider what activities are safe for you. Managing pain, stiffness, and swelling  If directed, put ice on your knee. To do this: Put ice in a plastic bag or use the icing device (cold flow pad) that you were given. Follow instructions from your health care provider about how to use the icing device. Place a towel between your skin and the bag or between your skin and the icing device. Leave the ice on for 20 minutes, 2-3 times a day. Remove the ice if your skin turns bright red. This is very important. If you cannot feel pain, heat, or cold, you have a greater risk of damage to the area. Move your toes often to reduce stiffness and swelling. Raise (elevate) your leg above the level of your heart while you are sitting or lying down. Use several pillows to keep your leg straight. Do not put a pillow  just under the knee. If the knee is bent for a long time, this may lead to stiffness. Wear elastic knee support as told by your health care provider. Safety  To help prevent falls, keep floors clear of objects you may trip over. Place items that you may need within easy reach. Wear an apron or tool belt with pockets for carrying objects. This leaves your hands free to help with your balance. Ask your health care provider when it is safe to drive. General instructions Wear compression stockings as told by your health care provider. These stockings help to prevent blood clots and reduce swelling in your legs. Continue with breathing exercises. This helps prevent lung infection. Do not use any products that contain nicotine or tobacco. These products include cigarettes, chewing tobacco, and vaping devices, such as e-cigarettes. These can delay healing after surgery. If you  need help quitting, ask your health care provider. Tell your health care provider if you plan to have dental work. Also: Tell your dentist about your joint replacement. Ask your health care provider if there are any special instructions you need to follow before having dental care and routine cleanings. Keep all follow-up visits. This is important. Contact a health care provider if: You have a fever or chills. You have a cough or feel short of breath. Your medicine is not controlling your pain. You have any of these signs of infection: More redness, swelling, or pain around your incision. More fluid or blood coming from your incision. Warmth coming from your incision. Pus or a bad smell coming from your incision. You fall. Get help right away if: You have severe pain. You have trouble breathing. You have chest pain. You have redness, swelling, pain, or warmth in your calf or leg. Your incision breaks open after sutures or staples are removed. These symptoms may represent a serious problem that is an emergency. Do not  wait to see if the symptoms will go away. Get medical help right away. Call your local emergency services (911 in the [place] Do not drive yourself to the hospital. Summary After the procedure, it is common to have pain and swelling at the incision area, a small amount of blood or fluid coming from your incision, and stiffness. Follow instructions from your health care provider about how to take care of your incision. Use crutches, a walker, or a cane as told by your health care provider. This information is not intended to replace advice given to you by your health care provider. Make sure you discuss any questions you have with your health care provider. Document Revised: 12/23/2019 Document Reviewed: 12/23/2019 Elsevier Patient Education  [place]. Spinal Anesthesia and Epidural Anesthesia, Care After This sheet gives you information about how to care for yourself after your procedure. Your doctor may also give you more specific instructions. If you have problems or questions, contact your doctor. What can I expect after the procedure? While the medicines you were given are still having effects, it is common to: Feel like you may vomit (nauseous). Vomit. Have a numb feeling or tingling in your legs. Have problems when you pee (urinate). Feel itchy. Follow these instructions at home: For the time period you were told by your doctor:  Rest. Do not do activities where you could fall or get hurt. Do not drive or use machines. Do not drink alcohol. Do not take sleeping pills or medicines that make you drowsy. Do not make big decisions or sign legal documents. Do not take care of children on your own. Eating and drinking If you vomit, wait a short time. When you can drink without vomiting, try to drink some water, juice, or clear soup. Drink enough fluid to keep your pee (urine) pale yellow. Make sure you do not feel like vomiting before you eat solid foods. Follow the diet that  your doctor recommends. General instructions If you have sleep apnea, surgery and certain medicines can raise your risk for breathing problems. Follow instructions from your doctor about when to wear your sleep device. Have a responsible adult stay with you for the time you are told. It is important to have someone help care for you until you are awake and alert. Return to your normal activities as told by your doctor. Ask your doctor what activities are safe for you. Take over-the-counter and prescription medicines only as  told by your doctor. Do not use any products that contain nicotine or tobacco, such as cigarettes, e-cigarettes, and chewing tobacco. If you need help quitting, ask your doctor. Keep all follow-up visits as told by your doctor. This is important. Contact a doctor if: It has been more than one day since your procedure and you feel like you may vomit or you are vomiting. You have a rash. Get help right away if: You have a fever. You have a headache that lasts a long time. You have a very bad headache. Your vision is blurry or you see two of a single object (double vision). You are dizzy or light-headed. You faint. Your arms or legs tingle, feel weak, or get numb. You have trouble breathing. You cannot pee. These symptoms may be an emergency. Get help right away. Call your local emergency services (911 in the [place] Do not wait to see if the symptoms will go away. Do not drive yourself to the hospital. Summary After the procedure, have a responsible adult stay with you at home. Do not do activities that might cause you to get hurt. Do not drive, use machinery, drink alcohol, or make important decisions as told by your doctor. Do not use products that contain nicotine or tobacco. Get help right away if you have a very bad headache, trouble breathing, or you cannot pee. This information is not intended to replace advice given to you by your health care provider. Make sure  you discuss any questions you have with your health care provider. Document Revised: 03/18/2020 Document Reviewed: 08/21/2019 Elsevier Patient Education  [place] Anesthesia, Adult, Care After The following information offers guidance on how to care for yourself after your procedure. Your health care provider may also give you more specific instructions. If you have problems or questions, contact your health care provider. What can I expect after the procedure? After the procedure, it is common for people to: Have pain or discomfort at the IV site. Have nausea or vomiting. Have a sore throat or hoarseness. Have trouble concentrating. Feel cold or chills. Feel weak, sleepy, or tired (fatigue). Have soreness and body aches. These can affect parts of the body that were not involved in surgery. Follow these instructions at home: For the time period you were told by your health care provider:  Rest. Do not participate in activities where you could fall or become injured. Do not drive or use machinery. Do not drink alcohol. Do not take sleeping pills or medicines that cause drowsiness. Do not make important decisions or sign legal documents. Do not take care of children on your own. General instructions Drink enough fluid to keep your urine pale yellow. If you have sleep apnea, surgery and certain medicines can increase your risk for breathing problems. Follow instructions from your health care provider about wearing your sleep device: Anytime you are sleeping, including during daytime naps. While taking prescription pain medicines, sleeping medicines, or medicines that make you drowsy. Return to your normal activities as told by your health care provider. Ask your health care provider what activities are safe for you. Take over-the-counter and prescription medicines only as told by your health care provider. Do not use any products that contain nicotine or tobacco. These  products include cigarettes, chewing tobacco, and vaping devices, such as e-cigarettes. These can delay incision healing after surgery. If you need help quitting, ask your health care provider. Contact a health care provider if: You have nausea or vomiting  that does not get better with medicine. You vomit every time you eat or drink. You have pain that does not get better with medicine. You cannot urinate or have bloody urine. You develop a skin rash. You have a fever. Get help right away if: You have trouble breathing. You have chest pain. You vomit blood. These symptoms may be an emergency. Get help right away. Call 911. Do not wait to see if the symptoms will go away. Do not drive yourself to the hospital. Summary After the procedure, it is common to have a sore throat, hoarseness, nausea, vomiting, or to feel weak, sleepy, or fatigue. For the time period you were told by your health care provider, do not drive or use machinery. Get help right away if you have difficulty breathing, have chest pain, or vomit blood. These symptoms may be an emergency. This information is not intended to replace advice given to you by your health care provider. Make sure you discuss any questions you have with your health care provider. Document Revised: 09/30/2021 Document Reviewed: 09/30/2021 Elsevier Patient Education  [place]. How to Use Chlorhexidine Before Surgery Chlorhexidine gluconate (CHG) is a germ-killing (antiseptic) solution that is used to clean the skin. It can get rid of the bacteria that normally live on the skin and can keep them away for about 24 hours. To clean your skin with CHG, you may be given: A CHG solution to use in the shower or as part of a sponge bath. A prepackaged cloth that contains CHG. Cleaning your skin with CHG may help lower the risk for infection: While you are staying in the intensive care unit of the hospital. If you have a vascular access, such as a  central line, to provide short-term or long-term access to your veins. If you have a catheter to drain urine from your bladder. If you are on a ventilator. A ventilator is a machine that helps you breathe by moving air in and out of your lungs. After surgery. What are the risks? Risks of using CHG include: A skin reaction. Hearing loss, if CHG gets in your ears and you have a perforated eardrum. Eye injury, if CHG gets in your eyes and is not rinsed out. The CHG product catching fire. Make sure that you avoid smoking and flames after applying CHG to your skin. Do not use CHG: If you have a chlorhexidine allergy or have previously reacted to chlorhexidine. On babies younger than 49 months of age. How to use CHG solution Use CHG only as told by your health care provider, and follow the instructions on the label. Use the full amount of CHG as directed. Usually, this is one bottle. During a shower Follow these steps when using CHG solution during a shower (unless your health care provider gives you different instructions): Start the shower. Use your normal soap and shampoo to wash your face and hair. Turn off the shower or move out of the shower stream. Pour the CHG onto a clean washcloth. Do not use any type of brush or rough-edged sponge. Starting at your neck, lather your body down to your toes. Make sure you follow these instructions: If you will be having surgery, pay special attention to the part of your body where you will be having surgery. Scrub this area for at least 1 minute. Do not use CHG on your head or face. If the solution gets into your ears or eyes, rinse them well with water. Avoid your genital  area. Avoid any areas of skin that have broken skin, cuts, or scrapes. Scrub your back and under your arms. Make sure to wash skin folds. Let the lather sit on your skin for 1-2 minutes or as long as told by your health care provider. Thoroughly rinse your entire body in the shower.  Make sure that all body creases and crevices are rinsed well. Dry off with a clean towel. Do not put any substances on your body afterward--such as powder, lotion, or perfume--unless you are told to do so by your health care provider. Only use lotions that are recommended by the manufacturer. Put on clean clothes or pajamas. If it is the night before your surgery, sleep in clean sheets.  During a sponge bath Follow these steps when using CHG solution during a sponge bath (unless your health care provider gives you different instructions): Use your normal soap and shampoo to wash your face and hair. Pour the CHG onto a clean washcloth. Starting at your neck, lather your body down to your toes. Make sure you follow these instructions: If you will be having surgery, pay special attention to the part of your body where you will be having surgery. Scrub this area for at least 1 minute. Do not use CHG on your head or face. If the solution gets into your ears or eyes, rinse them well with water. Avoid your genital area. Avoid any areas of skin that have broken skin, cuts, or scrapes. Scrub your back and under your arms. Make sure to wash skin folds. Let the lather sit on your skin for 1-2 minutes or as long as told by your health care provider. Using a different clean, wet washcloth, thoroughly rinse your entire body. Make sure that all body creases and crevices are rinsed well. Dry off with a clean towel. Do not put any substances on your body afterward--such as powder, lotion, or perfume--unless you are told to do so by your health care provider. Only use lotions that are recommended by the manufacturer. Put on clean clothes or pajamas. If it is the night before your surgery, sleep in clean sheets. How to use CHG prepackaged cloths Only use CHG cloths as told by your health care provider, and follow the instructions on the label. Use the CHG cloth on clean, dry skin. Do not use the CHG cloth on  your head or face unless your health care provider tells you to. When washing with the CHG cloth: Avoid your genital area. Avoid any areas of skin that have broken skin, cuts, or scrapes. Before surgery Follow these steps when using a CHG cloth to clean before surgery (unless your health care provider gives you different instructions): Using the CHG cloth, vigorously scrub the part of your body where you will be having surgery. Scrub using a back-and-forth motion for 3 minutes. The area on your body should be completely wet with CHG when you are done scrubbing. Do not rinse. Discard the cloth and let the area air-dry. Do not put any substances on the area afterward, such as powder, lotion, or perfume. Put on clean clothes or pajamas. If it is the night before your surgery, sleep in clean sheets.  For general bathing Follow these steps when using CHG cloths for general bathing (unless your health care provider gives you different instructions). Use a separate CHG cloth for each area of your body. Make sure you wash between any folds of skin and between your fingers and toes. Wash your body  in the following order, switching to a new cloth after each step: The front of your neck, shoulders, and chest. Both of your arms, under your arms, and your hands. Your stomach and groin area, avoiding the genitals. Your right leg and foot. Your left leg and foot. The back of your neck, your back, and your buttocks. Do not rinse. Discard the cloth and let the area air-dry. Do not put any substances on your body afterward--such as powder, lotion, or perfume--unless you are told to do so by your health care provider. Only use lotions that are recommended by the manufacturer. Put on clean clothes or pajamas. Contact a health care provider if: Your skin gets irritated after scrubbing. You have questions about using your solution or cloth. You swallow any chlorhexidine. Call your local poison control center  (1-(902) 654-9980 in the [place] Get help right away if: Your eyes itch badly, or they become very red or swollen. Your skin itches badly and is red or swollen. Your hearing changes. You have trouble seeing. You have swelling or tingling in your mouth or throat. You have trouble breathing. These symptoms may represent a serious problem that is an emergency. Do not wait to see if the symptoms will go away. Get medical help right away. Call your local emergency services (911 in the [place] Do not drive yourself to the hospital. Summary Chlorhexidine gluconate (CHG) is a germ-killing (antiseptic) solution that is used to clean the skin. Cleaning your skin with CHG may help to lower your risk for infection. You may be given CHG to use for bathing. It may be in a bottle or in a prepackaged cloth to use on your skin. Carefully follow your health care provider's instructions and the instructions on the product label. Do not use CHG if you have a chlorhexidine allergy. Contact your health care provider if your skin gets irritated after scrubbing. This information is not intended to replace advice given to you by your health care provider. Make sure you discuss any questions you have with your health care provider. Document Revised: 10/31/2021 Document Reviewed: 09/13/2020 Elsevier Patient Education  [place].

## 2022-07-27 ENCOUNTER — Encounter (HOSPITAL_COMMUNITY): Payer: Self-pay

## 2022-07-27 ENCOUNTER — Encounter (HOSPITAL_COMMUNITY)
Admission: RE | Admit: 2022-07-27 | Discharge: 2022-07-27 | Disposition: A | Discharge status: Home / Self Care | Payer: BC Managed Care – PPO | Source: Ambulatory Visit | Attending: Orthopedic Surgery | Admitting: Orthopedic Surgery

## 2022-07-27 VITALS — BP 162/89 | HR 92 | Temp 97.6°F | Resp 18 | Ht 64.0 in | Wt 216.1 lb

## 2022-07-27 DIAGNOSIS — Z01818 Encounter for other preprocedural examination: Secondary | ICD-10-CM | POA: Diagnosis not present

## 2022-07-27 DIAGNOSIS — M1712 Unilateral primary osteoarthritis, left knee: Secondary | ICD-10-CM | POA: Insufficient documentation

## 2022-07-27 DIAGNOSIS — R7303 Prediabetes: Secondary | ICD-10-CM

## 2022-07-27 DIAGNOSIS — I10 Essential (primary) hypertension: Secondary | ICD-10-CM | POA: Diagnosis not present

## 2022-07-27 LAB — CBC WITH DIFFERENTIAL/PLATELET
Abs Immature Granulocytes: 0.04 10*3/uL (ref 0.00–0.07)
Basophils Absolute: 0.1 10*3/uL (ref 0.0–0.1)
Basophils Relative: 1 %
Eosinophils Absolute: 0.2 10*3/uL (ref 0.0–0.5)
Eosinophils Relative: 2 %
HCT: 43 % (ref 36.0–46.0)
Hemoglobin: 13.5 g/dL (ref 12.0–15.0)
Immature Granulocytes: 0 %
Lymphocytes Relative: 30 %
Lymphs Abs: 3.7 10*3/uL (ref 0.7–4.0)
MCH: 27.7 pg (ref 26.0–34.0)
MCHC: 31.4 g/dL (ref 30.0–36.0)
MCV: 88.1 fL (ref 80.0–100.0)
Monocytes Absolute: 0.7 10*3/uL (ref 0.1–1.0)
Monocytes Relative: 5 %
Neutro Abs: 7.7 10*3/uL (ref 1.7–7.7)
Neutrophils Relative %: 62 %
Platelets: 428 10*3/uL — ABNORMAL HIGH (ref 150–400)
RBC: 4.88 MIL/uL (ref 3.87–5.11)
RDW: 14.5 % (ref 11.5–15.5)
WBC: 12.3 10*3/uL — ABNORMAL HIGH (ref 4.0–10.5)
nRBC: 0 % (ref 0.0–0.2)

## 2022-07-27 LAB — BASIC METABOLIC PANEL
Anion gap: 11 (ref 5–15)
BUN: 19 mg/dL (ref 6–20)
CO2: 24 mmol/L (ref 22–32)
Calcium: 8.9 mg/dL (ref 8.9–10.3)
Chloride: 104 mmol/L (ref 98–111)
Creatinine, Ser: 0.89 mg/dL (ref 0.44–1.00)
GFR, Estimated: >60 mL/min (ref >60)
Glucose, Bld: 125 mg/dL — ABNORMAL HIGH (ref 70–99)
Potassium: 3.7 mmol/L (ref 3.5–5.1)
Sodium: 139 mmol/L (ref 135–145)

## 2022-07-27 LAB — PREPARE RBC (CROSSMATCH)

## 2022-07-27 LAB — SURGICAL PCR SCREEN
MRSA, PCR: NEGATIVE
Staphylococcus aureus: NEGATIVE

## 2022-07-28 LAB — HEMOGLOBIN A1C
Hgb A1c MFr Bld: 6 % — ABNORMAL HIGH (ref 4.8–5.6)
Mean Plasma Glucose: 126 mg/dL

## 2022-07-31 NOTE — H&P (Signed)
TOTAL KNEE ADMISSION H&P  Patient is being admitted for left total knee arthroplasty.  Subjective:  Chief Complaint:left knee pain.  HPI: Robin Sherman, 62 y.o. female, has a history of pain and functional disability in the left knee due to arthritis and has failed non-surgical conservative treatments for greater than 12 weeks to includeNSAID's and/or analgesics, corticosteriod injections, and activity modification.  Onset of symptoms was gradual, starting  several  years ago with gradually worsening course since that time. Patient currently rates pain in the left knee(s) at 8 out of 10 with activity. Patient has night pain, worsening of pain with activity and weight bearing, pain that interferes with activities of daily living, pain with passive range of motion, and crepitus.  Patient has evidence of subchondral cysts, subchondral sclerosis, periarticular osteophytes, and joint space narrowing by imaging studies.  Patient Active Problem List   Diagnosis Date Noted  . Left knee pain 06/14/2022  . S/P TKR (total knee replacement), right 03/29/2021  . Isthmic spondylolisthesis 06/14/2020  . Polypharmacy 02/09/2020  . Encounter for screening colonoscopy 02/09/2020  . DDD (degenerative disc disease), lumbar 12/08/2019  . S/P right knee arthroscopy 12/12/18 *with chondroplasty patella  12/19/2018  . Borderline diabetes 07/02/2015  . Vitamin D deficiency 07/02/2015  . Lumbar back pain 07/02/2015  . Insomnia 10/29/2013  . Arthritis of knee, degenerative 10/13/2013  . Hand dermatitis 03/02/2012  . Essential hypertension, benign 09/21/2011  . Class 2 obesity 09/21/2011  . Shoulder pain, right 09/21/2011  . Asthma, intermittent 09/21/2011  . Tobacco user 09/21/2011   Past Medical History:  Diagnosis Date  . Allergy    pollen  . Arthritis    left knee  . Asthma   . Eczema    Followed by Dr. Nevada Crane dermatology  . Encounter for screening colonoscopy 02/09/2020  . Headache(784.0) 09/25/2012   . Hypertension   . Neuromuscular disorder ([place])   . Pre-diabetes   . sciatica right side 10/13/2013  . TRIGGER FINGER 11/03/2008   Qualifier: Diagnosis of  By: Aline Brochure MD, Dorothyann Peng      Past Surgical History:  Procedure Laterality Date  . ABDOMINAL HYSTERECTOMY     bleeding  . BILATERAL SALPINGECTOMY Bilateral 08/01/2013   Procedure: BILATERAL SALPINGECTOMY;  Surgeon: Jonnie Kind, MD;  Location: [facility];  Service: Gynecology;  Laterality: Bilateral;  . CHONDROPLASTY Left 01/14/2014   Procedure: CHONDROPLASTY OF FEMUR;  Surgeon: Carole Civil, MD;  Location: [facility];  Service: Orthopedics;  Laterality: Left;  . CHONDROPLASTY Right 12/12/2018   Procedure: CHONDROPLASTY;  Surgeon: Carole Civil, MD;  Location: [facility];  Service: Orthopedics;  Laterality: Right;  . COLONOSCOPY N/A 01/27/2013   Dr. Oneida Alar: Right colon with poor prep (patient ate [place] necks the night before the procedure).  Moderate diverticulosis in the sigmoid colon, moderate hemorrhoids.  . COLONOSCOPY WITH PROPOFOL N/A 04/06/2020   Procedure: COLONOSCOPY WITH PROPOFOL;  Surgeon: Eloise Harman, DO;  [facility]: [facility];  Service: Endoscopy;  Laterality: N/A;  8:15am  . HEMATOMA EVACUATION N/A 08/03/2013   Procedure: EVACUATION PELVIC HEMATOMA;  Surgeon: Jonnie Kind, MD;  Location: [facility];  Service: Gynecology;  Laterality: N/A;  . KNEE ARTHROSCOPY WITH MEDIAL MENISECTOMY Left 01/14/2014   Procedure: KNEE ARTHROSCOPY WITH PARTIAL MEDIAL MENISECTOMY;  Surgeon: Carole Civil, MD;  Location: [facility];  Service: Orthopedics;  Laterality: Left;  . KNEE ARTHROSCOPY WITH MEDIAL MENISECTOMY Right 12/12/2018   Procedure: KNEE ARTHROSCOPY WITH MEDIAL MENISCECTOMY;  Surgeon: Carole Civil, MD;  Location: [facility];  Service: Orthopedics;  Laterality: Right;  . POLYPECTOMY  04/06/2020   Procedure: POLYPECTOMY;  Surgeon: Eloise Harman, DO;  [facility];  Service: Endoscopy;;  . SUPRACERVICAL  ABDOMINAL HYSTERECTOMY N/A 08/01/2013   Procedure: HYSTERECTOMY SUPRACERVICAL ABDOMINAL;  Surgeon: Jonnie Kind, MD;  Location: [facility];  Service: Gynecology;  Laterality: N/A;  . TOTAL KNEE ARTHROPLASTY Right 03/29/2021   Procedure: TOTAL KNEE ARTHROPLASTY;  Surgeon: Carole Civil, MD;  Location: [facility];  Service: Orthopedics;  Laterality: Right;  . TRANSFORAMINAL LUMBAR INTERBODY FUSION W/ MIS 1 LEVEL Right 06/14/2020   Procedure: Right Lumbar Five- Sacral One Minimally invasive transforaminal lumbar interbody fusion;  Surgeon: Judith Part, MD;  Location: [facility];  Service: Neurosurgery;  Laterality: Right;  Right Lumbar Five- Sacral One Minimally invasive transforaminal lumbar interbody fusion  . TUBAL LIGATION     [facility]    Current Facility-Administered Medications  Medication Dose Route Frequency Provider Last Rate Last Admin  . bupivacaine-meloxicam ER (ZYNRELEF) injection 400 mg  400 mg Infiltration Once Carole Civil, MD       Current Outpatient Medications  Medication Sig Dispense Refill Last Dose  . albuterol (VENTOLIN HFA) 108 (90 Base) MCG/ACT inhaler Inhale 2 puffs into the lungs every 6 (six) hours as needed. 1 each 1   . Ascorbic Acid (VITAMIN C PO) Take 1 tablet by mouth daily.     . Cholecalciferol (VITAMIN D) 50 MCG (2000 UT) tablet Take 2,000 Units by mouth daily.     Marland Kitchen gabapentin (NEURONTIN) 400 MG capsule TAKE 1 CAPSULE BY MOUTH THREE TIMES A DAY. 60 capsule 0   . hydrOXYzine (ATARAX/VISTARIL) 25 MG tablet Take 1 tablet (25 mg total) by mouth 3 (three) times daily as needed. (Patient taking differently: Take 25-50 mg by mouth at bedtime as needed for anxiety.) 30 tablet 1   . lisinopril-hydrochlorothiazide (ZESTORETIC) 20-12.5 MG tablet Take 1 tablet by mouth daily. 90 tablet 1   . metFORMIN (GLUCOPHAGE) 500 MG tablet Take 1 tablet (500 mg total) by mouth daily with breakfast. (Patient not taking: Reported on 06/15/2022) 90 tablet 3   .  nitrofurantoin, macrocrystal-monohydrate, (MACROBID) 100 MG capsule Take 100 mg by mouth 2 (two) times daily.     Marland Kitchen oxyCODONE-acetaminophen (PERCOCET/ROXICET) 5-325 MG tablet Take 1 tablet by mouth every 6 (six) hours as needed for severe pain. 28 tablet 0    Allergies  Allergen Reactions  . Codeine Hives  . Acetaminophen Hives and Rash    If take with other medication  . Latex Rash  . Naproxen Rash    Patient tolerates Ibuprofen/ poss allergy - little blisters on fingers    Social History   Tobacco Use  . Smoking status: Every Day    Packs/day: 0.50    Years: 20.00    Total pack years: 10.00    Types: Cigarettes    Start date: 07/18/1979  . Smokeless tobacco: Never  . Tobacco comments:    wants to quit  Substance Use Topics  . Alcohol use: Yes    Alcohol/week: 0.0 standard drinks of alcohol    Comment: occ    Family History  Problem Relation Age of Onset  . Heart disease Mother        MI  . Asthma Mother   . Hypertension Mother   . Stroke Father   . Heart disease Father        MI  . Hypertension Father   .  Cancer Sister        breast  . Colon cancer Neg Hx      Review of Systems  Objective:  Physical Exam Vitals reviewed.  Constitutional:      General: She is not in acute distress.    Appearance: Normal appearance. She is not ill-appearing, toxic-appearing or diaphoretic.  HENT:     Head: Normocephalic and atraumatic.     Right Ear: External ear normal. There is no impacted cerumen.     Left Ear: External ear normal. There is no impacted cerumen.     Nose: No congestion or rhinorrhea.     Mouth/Throat:     Mouth: Mucous membranes are dry.     Pharynx: Oropharynx is clear. No oropharyngeal exudate or posterior oropharyngeal erythema.  Eyes:     General:        Right eye: No discharge.        Left eye: No discharge.     Extraocular Movements: Extraocular movements intact.     Conjunctiva/sclera: Conjunctivae normal.     Pupils: Pupils are equal, round,  and reactive to light.  Cardiovascular:     Rate and Rhythm: Normal rate and regular rhythm.     Pulses: Normal pulses.     Heart sounds: Normal heart sounds.  Pulmonary:     Effort: No respiratory distress.     Breath sounds: No stridor. No wheezing or rhonchi.  Abdominal:     General: Abdomen is flat. There is no distension.     Palpations: Abdomen is soft. There is no mass.  Musculoskeletal:     Left knee: Effusion and crepitus present. No swelling, erythema, ecchymosis or lacerations. Decreased range of motion. Tenderness present over the medial joint line. No LCL laxity, MCL laxity or ACL laxity.Abnormal alignment. Normal meniscus and normal patellar mobility.     Instability Tests: Anterior drawer test negative. Posterior drawer test negative.  Skin:    General: Skin is warm and dry.  Neurological:     General: No focal deficit present.     Mental Status: She is alert and oriented to person, place, and time. Mental status is at baseline.     Cranial Nerves: No cranial nerve deficit.     Sensory: No sensory deficit.     Motor: No weakness.     Coordination: Coordination normal.     Gait: Gait abnormal.     Deep Tendon Reflexes: Reflexes normal.  Psychiatric:        Mood and Affect: Mood normal.        Behavior: Behavior normal.        Thought Content: Thought content normal.        Judgment: Judgment normal.   Vital signs in last 24 hours:    Labs:   Estimated body mass index is 37.09 kg/m as calculated from the following:   Height as of 07/27/22: '5\' 4"'$  (1.626 m).   Weight as of 07/27/22: 98 kg.   Imaging Review Plain radiographs demonstrate severe degenerative joint disease of the left knee(s). The overall alignment ismild valgus. The bone quality appears to be good for age and reported activity level.      Assessment/Plan:  End stage arthritis, left knee   The patient history, physical examination, clinical judgment of the provider and imaging studies are  consistent with end stage degenerative joint disease of the left knee(s) and total knee arthroplasty is deemed medically necessary. The treatment options including medical management, injection therapy arthroscopy  and arthroplasty were discussed at length. The risks and benefits of total knee arthroplasty were presented and reviewed. The risks due to aseptic loosening, infection, stiffness, patella tracking problems, thromboembolic complications and other imponderables were discussed. The patient acknowledged the explanation, agreed to proceed with the plan and consent was signed. Patient is being admitted for inpatient treatment for surgery, pain control, PT, OT, prophylactic antibiotics, VTE prophylaxis, progressive ambulation and ADL's and discharge planning. The patient is planning to be discharged home with home health services

## 2022-08-01 ENCOUNTER — Ambulatory Visit (HOSPITAL_COMMUNITY): Payer: BC Managed Care – PPO

## 2022-08-01 ENCOUNTER — Encounter (HOSPITAL_COMMUNITY)
Admission: RE | Disposition: A | Discharge status: Home Health | Payer: Self-pay | Source: Home / Self Care | Attending: Orthopedic Surgery

## 2022-08-01 ENCOUNTER — Other Ambulatory Visit: Payer: Self-pay

## 2022-08-01 ENCOUNTER — Ambulatory Visit (HOSPITAL_COMMUNITY): Payer: BC Managed Care – PPO | Admitting: Certified Registered"

## 2022-08-01 ENCOUNTER — Observation Stay (HOSPITAL_COMMUNITY)
Admission: RE | Admit: 2022-08-01 | Discharge: 2022-08-02 | Disposition: A | Discharge status: Home Health | Payer: BC Managed Care – PPO | Attending: Orthopedic Surgery | Admitting: Orthopedic Surgery

## 2022-08-01 ENCOUNTER — Encounter (HOSPITAL_COMMUNITY): Payer: Self-pay | Admitting: Orthopedic Surgery

## 2022-08-01 DIAGNOSIS — M1712 Unilateral primary osteoarthritis, left knee: Principal | ICD-10-CM | POA: Insufficient documentation

## 2022-08-01 DIAGNOSIS — Z7984 Long term (current) use of oral hypoglycemic drugs: Secondary | ICD-10-CM | POA: Insufficient documentation

## 2022-08-01 DIAGNOSIS — Z96651 Presence of right artificial knee joint: Secondary | ICD-10-CM | POA: Insufficient documentation

## 2022-08-01 DIAGNOSIS — Z91040 Latex allergy status: Secondary | ICD-10-CM | POA: Diagnosis not present

## 2022-08-01 DIAGNOSIS — Z471 Aftercare following joint replacement surgery: Secondary | ICD-10-CM | POA: Diagnosis not present

## 2022-08-01 DIAGNOSIS — I10 Essential (primary) hypertension: Secondary | ICD-10-CM | POA: Diagnosis not present

## 2022-08-01 DIAGNOSIS — Z79899 Other long term (current) drug therapy: Secondary | ICD-10-CM | POA: Diagnosis not present

## 2022-08-01 DIAGNOSIS — J45909 Unspecified asthma, uncomplicated: Secondary | ICD-10-CM | POA: Diagnosis not present

## 2022-08-01 DIAGNOSIS — F17210 Nicotine dependence, cigarettes, uncomplicated: Secondary | ICD-10-CM | POA: Insufficient documentation

## 2022-08-01 DIAGNOSIS — Z96652 Presence of left artificial knee joint: Secondary | ICD-10-CM | POA: Diagnosis not present

## 2022-08-01 HISTORY — PX: TOTAL KNEE ARTHROPLASTY: SHX125

## 2022-08-01 LAB — GLUCOSE, CAPILLARY: Glucose-Capillary: 105 mg/dL — ABNORMAL HIGH (ref 70–99)

## 2022-08-01 SURGERY — ARTHROPLASTY, KNEE, TOTAL
Anesthesia: Spinal | Site: Knee | Laterality: Left

## 2022-08-01 MED ORDER — SUGAMMADEX SODIUM 200 MG/2ML IV SOLN
INTRAVENOUS | Status: DC | PRN
  Administered 2022-08-01: 150 mg via INTRAVENOUS

## 2022-08-01 MED ORDER — BUPIVACAINE-MELOXICAM ER 200-6 MG/7ML IJ SOLN
INTRAMUSCULAR | Status: DC | PRN
  Administered 2022-08-01: 400 mg

## 2022-08-01 MED ORDER — PROPOFOL 500 MG/50ML IV EMUL
INTRAVENOUS | Status: DC | PRN
  Administered 2022-08-01: 25 ug/kg/min via INTRAVENOUS

## 2022-08-01 MED ORDER — BUPIVACAINE-MELOXICAM ER 200-6 MG/7ML IJ SOLN
INTRAMUSCULAR | Status: AC
  Filled 2022-08-01: qty 2

## 2022-08-01 MED ORDER — LISINOPRIL-HYDROCHLOROTHIAZIDE 20-12.5 MG PO TABS: 1.0000 | ORAL_TABLET | Freq: Every day | ORAL | Status: DC

## 2022-08-01 MED ORDER — ONDANSETRON HCL 4 MG/2ML IJ SOLN
4.0000 mg | Freq: Once | INTRAMUSCULAR | Status: AC
  Administered 2022-08-01: 4 mg via INTRAVENOUS
  Filled 2022-08-01: qty 2

## 2022-08-01 MED ORDER — METHOCARBAMOL 1000 MG/10ML IJ SOLN
500.0000 mg | Freq: Once | INTRAVENOUS | Status: AC
  Administered 2022-08-01: 500 mg via INTRAVENOUS
  Filled 2022-08-01: qty 5

## 2022-08-01 MED ORDER — ONDANSETRON HCL 4 MG/2ML IJ SOLN: 4.0000 mg | Freq: Four times a day (QID) | INTRAMUSCULAR | Status: DC | PRN

## 2022-08-01 MED ORDER — ONDANSETRON HCL 4 MG/2ML IJ SOLN: 4.0000 mg | Freq: Once | INTRAMUSCULAR | Status: DC | PRN

## 2022-08-01 MED ORDER — POVIDONE-IODINE 10 % EX SWAB: 2.0000 | Freq: Once | CUTANEOUS | Status: DC

## 2022-08-01 MED ORDER — LACTATED RINGERS IV SOLN: INTRAVENOUS | Status: DC

## 2022-08-01 MED ORDER — ORAL CARE MOUTH RINSE: 15.0000 mL | OROMUCOSAL | Status: DC | PRN

## 2022-08-01 MED ORDER — OXYCODONE HCL 5 MG/5ML PO SOLN: 5.0000 mg | Freq: Once | ORAL | Status: DC | PRN

## 2022-08-01 MED ORDER — ESMOLOL HCL 100 MG/10ML IV SOLN
INTRAVENOUS | Status: DC | PRN
  Administered 2022-08-01 (×4): 10 mg via INTRAVENOUS

## 2022-08-01 MED ORDER — ROPIVACAINE HCL 5 MG/ML IJ SOLN
INTRAMUSCULAR | Status: AC
  Filled 2022-08-01: qty 30

## 2022-08-01 MED ORDER — LIDOCAINE HCL (PF) 2 % IJ SOLN
INTRAMUSCULAR | Status: AC
  Filled 2022-08-01: qty 5

## 2022-08-01 MED ORDER — GABAPENTIN 400 MG PO CAPS
400.0000 mg | ORAL_CAPSULE | Freq: Three times a day (TID) | ORAL | Status: DC
  Administered 2022-08-01 – 2022-08-02 (×3): 400 mg via ORAL
  Filled 2022-08-01 (×3): qty 1

## 2022-08-01 MED ORDER — DEXMEDETOMIDINE HCL IN NACL 80 MCG/20ML IV SOLN
INTRAVENOUS | Status: AC
  Filled 2022-08-01: qty 20

## 2022-08-01 MED ORDER — HYDROCHLOROTHIAZIDE 12.5 MG PO TABS
12.5000 mg | ORAL_TABLET | Freq: Every day | ORAL | Status: DC
  Administered 2022-08-02: 12.5 mg via ORAL
  Filled 2022-08-01: qty 1

## 2022-08-01 MED ORDER — CEFAZOLIN SODIUM-DEXTROSE 2-4 GM/100ML-% IV SOLN
INTRAVENOUS | Status: AC
  Filled 2022-08-01: qty 100

## 2022-08-01 MED ORDER — MIDAZOLAM HCL 2 MG/2ML IJ SOLN
INTRAMUSCULAR | Status: AC
  Filled 2022-08-01: qty 2

## 2022-08-01 MED ORDER — OXYCODONE HCL 5 MG PO TABS
10.0000 mg | ORAL_TABLET | ORAL | Status: DC | PRN
  Administered 2022-08-02: 15 mg via ORAL
  Filled 2022-08-01: qty 3

## 2022-08-01 MED ORDER — ACETAMINOPHEN 10 MG/ML IV SOLN
INTRAVENOUS | Status: AC
  Filled 2022-08-01: qty 100

## 2022-08-01 MED ORDER — ROCURONIUM BROMIDE 100 MG/10ML IV SOLN
INTRAVENOUS | Status: DC | PRN
  Administered 2022-08-01: 70 mg via INTRAVENOUS
  Administered 2022-08-01 (×2): 10 mg via INTRAVENOUS

## 2022-08-01 MED ORDER — ESMOLOL HCL 100 MG/10ML IV SOLN
INTRAVENOUS | Status: AC
  Filled 2022-08-01: qty 10

## 2022-08-01 MED ORDER — FENTANYL CITRATE (PF) 100 MCG/2ML IJ SOLN
INTRAMUSCULAR | Status: AC
  Filled 2022-08-01: qty 2

## 2022-08-01 MED ORDER — ROCURONIUM BROMIDE 10 MG/ML (PF) SYRINGE
PREFILLED_SYRINGE | INTRAVENOUS | Status: AC
  Filled 2022-08-01: qty 20

## 2022-08-01 MED ORDER — OXYCODONE HCL 5 MG PO TABS
5.0000 mg | ORAL_TABLET | Freq: Once | ORAL | Status: AC
  Administered 2022-08-01: 5 mg via ORAL
  Filled 2022-08-01: qty 1

## 2022-08-01 MED ORDER — LIDOCAINE 2% (20 MG/ML) 5 ML SYRINGE
INTRAMUSCULAR | Status: DC | PRN
  Administered 2022-08-01: 100 mg via INTRAVENOUS

## 2022-08-01 MED ORDER — DEXAMETHASONE SODIUM PHOSPHATE 10 MG/ML IJ SOLN
INTRAMUSCULAR | Status: DC | PRN
  Administered 2022-08-01: 10 mg via INTRAVENOUS

## 2022-08-01 MED ORDER — METOCLOPRAMIDE HCL 5 MG/ML IJ SOLN: 5.0000 mg | Freq: Three times a day (TID) | INTRAMUSCULAR | Status: DC | PRN

## 2022-08-01 MED ORDER — PROPOFOL 10 MG/ML IV BOLUS
INTRAVENOUS | Status: AC
  Filled 2022-08-01: qty 20

## 2022-08-01 MED ORDER — DEXAMETHASONE SODIUM PHOSPHATE 10 MG/ML IJ SOLN
INTRAMUSCULAR | Status: AC
  Filled 2022-08-01: qty 1

## 2022-08-01 MED ORDER — LABETALOL HCL 5 MG/ML IV SOLN
INTRAVENOUS | Status: DC | PRN
  Administered 2022-08-01: 2.5 mg via INTRAVENOUS

## 2022-08-01 MED ORDER — CHLORHEXIDINE GLUCONATE 0.12 % MT SOLN
15.0000 mL | Freq: Once | OROMUCOSAL | Status: AC
  Administered 2022-08-01: 15 mL via OROMUCOSAL

## 2022-08-01 MED ORDER — TRANEXAMIC ACID-NACL 1000-0.7 MG/100ML-% IV SOLN
1000.0000 mg | INTRAVENOUS | Status: AC
  Administered 2022-08-01: 1000 mg via INTRAVENOUS
  Filled 2022-08-01: qty 100

## 2022-08-01 MED ORDER — DEXMEDETOMIDINE HCL IN NACL 80 MCG/20ML IV SOLN
INTRAVENOUS | Status: DC | PRN
  Administered 2022-08-01 (×5): 4 ug via BUCCAL

## 2022-08-01 MED ORDER — PHENOL 1.4 % MT LIQD: 1.0000 | OROMUCOSAL | Status: DC | PRN

## 2022-08-01 MED ORDER — ONDANSETRON HCL 4 MG PO TABS: 4.0000 mg | ORAL_TABLET | Freq: Four times a day (QID) | ORAL | Status: DC | PRN

## 2022-08-01 MED ORDER — KETAMINE HCL 10 MG/ML IJ SOLN
INTRAMUSCULAR | Status: DC | PRN
  Administered 2022-08-01 (×3): 10 mg via INTRAVENOUS

## 2022-08-01 MED ORDER — DEXAMETHASONE SODIUM PHOSPHATE 10 MG/ML IJ SOLN
10.0000 mg | Freq: Once | INTRAMUSCULAR | Status: AC
  Administered 2022-08-02: 10 mg via INTRAVENOUS
  Filled 2022-08-01: qty 1

## 2022-08-01 MED ORDER — DOCUSATE SODIUM 100 MG PO CAPS
100.0000 mg | ORAL_CAPSULE | Freq: Two times a day (BID) | ORAL | Status: DC
  Administered 2022-08-01 – 2022-08-02 (×2): 100 mg via ORAL
  Filled 2022-08-01 (×2): qty 1

## 2022-08-01 MED ORDER — KETAMINE HCL 50 MG/5ML IJ SOSY
PREFILLED_SYRINGE | INTRAMUSCULAR | Status: AC
  Filled 2022-08-01: qty 5

## 2022-08-01 MED ORDER — POLYETHYLENE GLYCOL 3350 17 G PO PACK: 17.0000 g | PACK | Freq: Every day | ORAL | Status: DC | PRN

## 2022-08-01 MED ORDER — HYDROMORPHONE HCL 1 MG/ML IJ SOLN
0.5000 mg | INTRAMUSCULAR | Status: DC | PRN
  Administered 2022-08-01 – 2022-08-02 (×5): 1 mg via INTRAVENOUS
  Filled 2022-08-01 (×5): qty 1

## 2022-08-01 MED ORDER — CEFAZOLIN SODIUM-DEXTROSE 2-4 GM/100ML-% IV SOLN
2.0000 g | INTRAVENOUS | Status: AC
  Administered 2022-08-01: 2 g via INTRAVENOUS

## 2022-08-01 MED ORDER — FENTANYL CITRATE PF 50 MCG/ML IJ SOSY
25.0000 ug | PREFILLED_SYRINGE | INTRAMUSCULAR | Status: DC | PRN
  Administered 2022-08-01 (×3): 50 ug via INTRAVENOUS
  Filled 2022-08-01 (×3): qty 1

## 2022-08-01 MED ORDER — ASPIRIN 325 MG PO TBEC
325.0000 mg | DELAYED_RELEASE_TABLET | Freq: Every day | ORAL | Status: DC
  Administered 2022-08-02: 325 mg via ORAL
  Filled 2022-08-01: qty 1

## 2022-08-01 MED ORDER — CEFAZOLIN SODIUM-DEXTROSE 2-4 GM/100ML-% IV SOLN
2.0000 g | Freq: Four times a day (QID) | INTRAVENOUS | Status: AC
  Administered 2022-08-01 (×2): 2 g via INTRAVENOUS
  Filled 2022-08-01 (×2): qty 100

## 2022-08-01 MED ORDER — PREGABALIN 50 MG PO CAPS
50.0000 mg | ORAL_CAPSULE | Freq: Once | ORAL | Status: AC
  Administered 2022-08-01: 50 mg via ORAL

## 2022-08-01 MED ORDER — HYDROXYZINE HCL 25 MG PO TABS: 25.0000 mg | ORAL_TABLET | Freq: Every evening | ORAL | Status: DC | PRN

## 2022-08-01 MED ORDER — PROPOFOL 500 MG/50ML IV EMUL
INTRAVENOUS | Status: AC
  Filled 2022-08-01: qty 50

## 2022-08-01 MED ORDER — HYDROMORPHONE HCL 1 MG/ML IJ SOLN
INTRAMUSCULAR | Status: DC | PRN
  Administered 2022-08-01: .5 mg via INTRAVENOUS

## 2022-08-01 MED ORDER — DIPHENHYDRAMINE HCL 50 MG/ML IJ SOLN
INTRAMUSCULAR | Status: AC
  Filled 2022-08-01: qty 1

## 2022-08-01 MED ORDER — PREGABALIN 50 MG PO CAPS
ORAL_CAPSULE | ORAL | Status: AC
  Filled 2022-08-01: qty 1

## 2022-08-01 MED ORDER — HYDROMORPHONE HCL 1 MG/ML IJ SOLN
INTRAMUSCULAR | Status: AC
  Filled 2022-08-01: qty 1

## 2022-08-01 MED ORDER — SODIUM CHLORIDE 0.9 % IR SOLN
Status: DC | PRN
  Administered 2022-08-01: 3000 mL

## 2022-08-01 MED ORDER — OXYCODONE HCL 5 MG PO TABS
5.0000 mg | ORAL_TABLET | ORAL | Status: DC | PRN
  Administered 2022-08-01 – 2022-08-02 (×3): 10 mg via ORAL
  Filled 2022-08-01 (×3): qty 2

## 2022-08-01 MED ORDER — FENTANYL CITRATE (PF) 250 MCG/5ML IJ SOLN
INTRAMUSCULAR | Status: AC
  Filled 2022-08-01: qty 5

## 2022-08-01 MED ORDER — DIPHENHYDRAMINE HCL 50 MG/ML IJ SOLN
INTRAMUSCULAR | Status: DC | PRN
  Administered 2022-08-01: 12.5 mg via INTRAVENOUS

## 2022-08-01 MED ORDER — ORAL CARE MOUTH RINSE: 15.0000 mL | Freq: Once | OROMUCOSAL | Status: AC

## 2022-08-01 MED ORDER — 0.9 % SODIUM CHLORIDE (POUR BTL) OPTIME
TOPICAL | Status: DC | PRN
  Administered 2022-08-01: 1000 mL

## 2022-08-01 MED ORDER — CHLORHEXIDINE GLUCONATE 0.12 % MT SOLN
OROMUCOSAL | Status: AC
  Filled 2022-08-01: qty 15

## 2022-08-01 MED ORDER — METOCLOPRAMIDE HCL 10 MG PO TABS: 5.0000 mg | ORAL_TABLET | Freq: Three times a day (TID) | ORAL | Status: DC | PRN

## 2022-08-01 MED ORDER — FENTANYL CITRATE (PF) 100 MCG/2ML IJ SOLN
INTRAMUSCULAR | Status: DC | PRN
  Administered 2022-08-01 (×7): 50 ug via INTRAVENOUS

## 2022-08-01 MED ORDER — LISINOPRIL 10 MG PO TABS
20.0000 mg | ORAL_TABLET | Freq: Every day | ORAL | Status: DC
  Administered 2022-08-02: 20 mg via ORAL
  Filled 2022-08-01: qty 2

## 2022-08-01 MED ORDER — SODIUM CHLORIDE 0.9 % IV SOLN: INTRAVENOUS | Status: DC

## 2022-08-01 MED ORDER — MIDAZOLAM HCL 5 MG/5ML IJ SOLN
INTRAMUSCULAR | Status: DC | PRN
  Administered 2022-08-01: 2 mg via INTRAVENOUS

## 2022-08-01 MED ORDER — MENTHOL 3 MG MT LOZG: 1.0000 | LOZENGE | OROMUCOSAL | Status: DC | PRN

## 2022-08-01 MED ORDER — ONDANSETRON HCL 4 MG/2ML IJ SOLN
INTRAMUSCULAR | Status: DC | PRN
  Administered 2022-08-01: 4 mg via INTRAVENOUS

## 2022-08-01 MED ORDER — METHOCARBAMOL 1000 MG/10ML IJ SOLN: 500.0000 mg | Freq: Four times a day (QID) | INTRAVENOUS | Status: DC | PRN

## 2022-08-01 MED ORDER — METHOCARBAMOL 500 MG PO TABS: 500.0000 mg | ORAL_TABLET | Freq: Four times a day (QID) | ORAL | Status: DC | PRN

## 2022-08-01 MED ORDER — OXYCODONE HCL 5 MG PO TABS: 5.0000 mg | ORAL_TABLET | Freq: Once | ORAL | Status: DC | PRN

## 2022-08-01 MED ORDER — ONDANSETRON HCL 4 MG/2ML IJ SOLN
INTRAMUSCULAR | Status: AC
  Filled 2022-08-01: qty 2

## 2022-08-01 MED ORDER — PROPOFOL 10 MG/ML IV BOLUS
INTRAVENOUS | Status: DC | PRN
  Administered 2022-08-01: 160 mg via INTRAVENOUS

## 2022-08-01 SURGICAL SUPPLY — 62 items
ANCH SUT 2 CRKSW 15.5X5 (Anchor) ×3 IMPLANT
ANCHOR SUT CORKSCREW 5X15.5 (Anchor) IMPLANT
ATTUNE PSFEM LTSZ5 NARCEM KNEE (Femur) IMPLANT
BANDAGE ESMARK 6X9 LF (GAUZE/BANDAGES/DRESSINGS) ×2 IMPLANT
BASEPLATE TIB CMT FB PCKT SZ5 (Knees) IMPLANT
BLADE SAGITTAL 25.0X1.27X90 (BLADE) ×2 IMPLANT
BLADE SAW SGTL 11.0X1.19X90.0M (BLADE) ×2 IMPLANT
BLADE SURG SZ10 CARB STEEL (BLADE) ×2 IMPLANT
BNDG CMPR 9X6 STRL LF SNTH (GAUZE/BANDAGES/DRESSINGS) ×1
BNDG ESMARK 6X9 LF (GAUZE/BANDAGES/DRESSINGS) ×1
BSPLAT TIB 5 CMNT FXBRNG STRL (Knees) ×1 IMPLANT
CEMENT HV SMART SET (Cement) ×4 IMPLANT
CLOTH BEACON ORANGE TIMEOUT ST (SAFETY) ×2 IMPLANT
COOLER ICEMAN CLASSIC (MISCELLANEOUS) ×2 IMPLANT
COVER LIGHT HANDLE STERIS (MISCELLANEOUS) ×4 IMPLANT
CUFF TOURN SGL QUICK 42 (TOURNIQUET CUFF) IMPLANT
DRAPE BACK TABLE (DRAPES) ×2 IMPLANT
DRAPE EXTREMITY T 121X128X90 (DISPOSABLE) ×2 IMPLANT
DRESSING AQUACEL AG ADV 3.5X12 (MISCELLANEOUS) ×2 IMPLANT
DRSG AQUACEL AG ADV 3.5X12 (MISCELLANEOUS) ×1
DURAPREP 26ML APPLICATOR (WOUND CARE) ×4 IMPLANT
ELECT REM PT RETURN 9FT ADLT (ELECTROSURGICAL) ×1
ELECTRODE REM PT RTRN 9FT ADLT (ELECTROSURGICAL) ×2 IMPLANT
GLOVE BIOGEL PI IND STRL 6.5 (GLOVE) IMPLANT
GLOVE BIOGEL PI IND STRL 7.0 (GLOVE) ×6 IMPLANT
GLOVE BIOGEL PI IND STRL 8.5 (GLOVE) ×2 IMPLANT
GLOVE SKINSENSE STRL SZ8.0 LF (GLOVE) ×2 IMPLANT
GLOVE SURG SS PI 6.5 STRL IVOR (GLOVE) IMPLANT
GLOVE SURG SS PI 7.0 STRL IVOR (GLOVE) IMPLANT
GOWN STRL REUS W/TWL LRG LVL3 (GOWN DISPOSABLE) ×6 IMPLANT
GOWN STRL REUS W/TWL XL LVL3 (GOWN DISPOSABLE) ×2 IMPLANT
HANDPIECE INTERPULSE COAX TIP (DISPOSABLE) ×1
HOOD W/PEELAWAY (MISCELLANEOUS) ×8 IMPLANT
INSERT TIBIAL FIX BEARING SZ5 (Insert) IMPLANT
INST SET MAJOR BONE (KITS) ×2 IMPLANT
IV NS IRRIG 3000ML ARTHROMATIC (IV SOLUTION) ×2 IMPLANT
KIT BLADEGUARD II DBL (SET/KITS/TRAYS/PACK) ×2 IMPLANT
KIT TURNOVER KIT A (KITS) ×2 IMPLANT
MANIFOLD NEPTUNE II (INSTRUMENTS) ×2 IMPLANT
MARKER SKIN DUAL TIP RULER LAB (MISCELLANEOUS) ×2 IMPLANT
NDL MAYO 1/2 CRC TROCAR PT (NEEDLE) IMPLANT
NEEDLE MAYO 1/2 CRC TROCAR PT (NEEDLE) ×1 IMPLANT
NS IRRIG 1000ML POUR BTL (IV SOLUTION) ×2 IMPLANT
PACK TOTAL JOINT (CUSTOM PROCEDURE TRAY) ×2 IMPLANT
PAD ARMBOARD 7.5X6 YLW CONV (MISCELLANEOUS) ×2 IMPLANT
PAD COLD SHLDR SM WRAP-ON (PAD) ×2 IMPLANT
PATELLA MEDIAL ATTUN 35MM KNEE (Knees) IMPLANT
PILLOW KNEE EXTENSION 0 DEG (MISCELLANEOUS) ×2 IMPLANT
SAW OSC TIP CART 19.5X105X1.3 (SAW) ×2 IMPLANT
SET BASIN LINEN APH (SET/KITS/TRAYS/PACK) ×2 IMPLANT
SET HNDPC FAN SPRY TIP SCT (DISPOSABLE) ×2 IMPLANT
SPONGE T-LAP 18X18 ~~LOC~~+RFID (SPONGE) IMPLANT
STAPLER VISISTAT 35W (STAPLE) ×2 IMPLANT
SUT BRALON NAB BRD #1 30IN (SUTURE) ×2 IMPLANT
SUT MNCRL 0 VIOLET CTX 36 (SUTURE) ×2 IMPLANT
SUT MON AB 0 CT1 (SUTURE) ×2 IMPLANT
SUT MONOCRYL 0 CTX 36 (SUTURE) ×2
SYR BULB IRRIG 60ML STRL (SYRINGE) ×2 IMPLANT
TOWEL OR 17X26 4PK STRL BLUE (TOWEL DISPOSABLE) ×2 IMPLANT
TOWER CARTRIDGE SMART MIX (DISPOSABLE) ×2 IMPLANT
WATER STERILE IRR 1000ML POUR (IV SOLUTION) ×4 IMPLANT
YANKAUER SUCT 12FT TUBE ARGYLE (SUCTIONS) ×2 IMPLANT

## 2022-08-01 NOTE — Op Note (Signed)
Dictation for total knee replacement  08/01/2022  3:37 PM  Preop diagnosis osteoarthritis left KNEE  Postop diagnosis osteoarthritis left KNEE  Procedure left total knee arthroplasty  Implants   DePuy   Attune, fixed-bearing posterior stabilized total knee: Sizes: Femur   5 narrow   tibia 5   patella 38   POLYthylene 20  Bone cuts:   Distal femur 11  PROXIMAL TIBIA guide was set for 3 from the lower medial side however the measured amount resected was 14 from the lateral side  PATELLA 24-10 for a 14 thickness without the button         ASSISTANTS: Nikki Cox  ANESTHESIA:   General plus saphenous nerve block  BLOOD ADMINISTERED:none  DRAINS: none   LOCAL MEDICATIONS USED: Zen relief   SPECIMEN:  No Specimen  DISPOSITION OF SPECIMEN:  N/A  COUNTS:  YES  TOURNIQUET: 114 minutes  DICTATION: .Dragon Dictation   The patient was taken to the recovery room in stable condition  PLAN OF CARE: Routine  PATIENT DISPOSITION:  PACU - hemodynamically stable.   Delay start of Pharmacological VTE agent (>24hrs) due to surgical blood loss or risk of bleeding: not applicable  Details of surgery: The patient was identified by 2 approved identification mechanisms. The operative extremity was evaluated and found to be acceptable for surgical treatment today. The chart was reviewed. The surgical site was confirmed and marked. The patient had a saphenous nerve block postop  The patient was taken to the operating room and given appropriate antibiotic Ancef 2 g. This is consistent with the SCIP protocol.  The patient was given the following anesthetic: General and then postoperatively had a saphenous nerve block  The patient was then placed supine on the operating table. A Foley catheter was inserted. The operative extremity was prepped and draped sterilely from the toes to the groin.  Timeout was executed confirming the patient's name, surgical site, antibiotic administration,  x-rays available, and implants available.  The operative limb,  was exsanguinated with a six-inch Esmarch and the tourniquet was inflated to 280 mmHg. after skin incision this was increased to 300 due to breakthrough bleeding  A straight midline incision was made over the left KNEE and taken down to the extensor mechanism. A medial arthrotomy was performed. The patella was everted and the patellofemoral soft tissue was released, along with the patellar fat pad.  The anterior cruciate ligament and PCL were resected.  The anterior horns of the lateral and medial meniscus were resected. The medial soft tissue sleeve was elevated to the mid coronal plane.  A three-eighths inch drill bit was used to enter the femoral canal which was decompressed with suction and irrigation until clear.   The distal femoral cutting guide was set for 11 mm distal resection,  5valgus alignment, for a left knee. The distal femur was resected and checked for flatness.  Bone resection measured 10.5   The external alignment guide for the tibial resection was then applied to the distal and proximal tibia and set for anatomic slope with a 3 mm stylus on the deficient medial side  Rotational alignment was set using malleolus tibial tubercle tibial spines  The proximal tibia was resected along with residual menisci and the tibia was sized to a baseplate of size 4   The Depuy attune sizing femoral guide was placed and the femur was sized to a size 2.5 however I noticed that the patient had a size 4 for her last knee.  I  therefore pending cup for 5  Prior to making the floor cuts for the distal femur a size 10 spacer block was placed in the extension gap which filled it up nicely and then this was checked against the posterior cutting block with good fit  A 4-in-1 cutting block was placed along with collateral ligament retractors and the distal femoral cuts were completed.  At this point it was noted that the flexion space  was very very wide.  It required a 20 to fill of the flexion space and then when the extension gap was checked it also needed an 18 if not 20  I therefore checked the medial collateral ligament and flexion and extension and found that the gap was opening severely with external rotation of the foot and with laminar spreaders applied to the flexion gap  The notch was cut with a size 4 Notch cutting guide and a trial reduction was performed.  The trial reduction again showed a huge flexion gap  After trialing up to a size 20 to get control of the flexion gap I decided to upsize the femur to add additional 3 mm of posterior offset and then balanced the knee with an 18 polyethylene insert   Trial reduction was completed   I noted at this time that the flexion gap was severely wide on the medial side in flexion but not so much in extension  I proceeded to prepare the patella We then skeletonized the patella. It measured 24 in thickness and the patellar resection was set for 14 millimeters. the patellar resection was completed. The patella diameter measured 38. We then drilled the peg holes for the patella.  Completed patellar thickness was 13+10.5 or 23.5  The proximal tibia was prepared using the size 5 base plate.  I placed 2 suture anchors size 5.5 from Arthrex they were corkscrew suture anchors 1 broke and had to be replaced and then internally rotated the leg to reduce the subluxation and flexion and tighten the medial collateral ligament and flexion.  This prevented the subluxation and external rotation and flexion.  Thorough irrigation was performed and the bone was dried and prepared for cement. The cement was mixed on the back table using third generation preparation techniques  The implants were then cemented in place and excess cement was removed. The cement was allowed to cure. Irrigation was repeated and excess and residual bone fragments and cement were removed.  This tourniquet was  released there was no real bleeding and I proceeded with closure  ZYNRELEF was injected into the joint  The extensor mechanism was closed with #1 Bralon suture followed by subcutaneous tissue closure using 0 Monocryl suture in 2 layers   Skin approximation was performed using staples  A sterile dressing was applied, TED hose were placed on the operative extremity followed by Cryo/Cuff.  The patient was taken recovery room in stable condition  Postop plan: Weightbearing as tolerated CPM machine Immediate physical therapy Discharge tomorrow if stable

## 2022-08-01 NOTE — Brief Op Note (Signed)
08/01/2022  10:55 AM  PATIENT:  Robin Sherman  62 y.o. female  PRE-OPERATIVE DIAGNOSIS:  osteoarthritis left knee  POST-OPERATIVE DIAGNOSIS:  osteoarthritis left knee  PROCEDURE:  Procedure(s): TOTAL KNEE ARTHROPLASTY (Left)  Implants  Attune fixed-bearing PS knee  5 narrow femur  5 tibia  18 poly  38 patella  Two 5 point 5 suture anchors Arthrex corkscrew  Findings severe arthritis moderate varus fixed  Multiple loose bodies and osteophyte surrounding the joint especially posteriorly  Unstable flexion gap secondary to laxity of the anteromedial structures which cause rotatory instability of the knee in flexion this was handled by repairing the anterior medial soft tissue sleeve with 2 corkscrew anchors advancing the tissue and shortening the medial collateral ligament with the knee in flexion  SURGEON:  Surgeon(s) and Role:    Carole Civil, MD - Primary  PHYSICIAN ASSISTANT:   ASSISTANTS: Franklyn Lor and Abigail Butts Kindred  ANESTHESIA:   general postop posteromedial saphenous nerve block  EBL:  250 mL   BLOOD ADMINISTERED:none  DRAINS: none   LOCAL MEDICATIONS USED:  OTHER zinrelef  SPECIMEN:  No Specimen  DISPOSITION OF SPECIMEN:  N/A  COUNTS:  YES  TOURNIQUET:   Total Tourniquet Time Documented: Upper Arm (Left) - 114 minutes Total: Upper Arm (Left) - 114 minutes   DICTATION: .Viviann Spare Dictation  PLAN OF CARE: Admit for overnight observation  PATIENT DISPOSITION:  PACU - hemodynamically stable.   Delay start of Pharmacological VTE agent (>24hrs) due to surgical blood loss or risk of bleeding: not applicable

## 2022-08-01 NOTE — Anesthesia Procedure Notes (Signed)
Procedure Name: Intubation Date/Time: 08/01/2022 7:40 AM  Performed by: Gwyndolyn Saxon, CRNAPre-anesthesia Checklist: Patient identified, Emergency Drugs available, Suction available and Patient being monitored Patient Re-evaluated:Patient Re-evaluated prior to induction Oxygen Delivery Method: Circle system utilized Preoxygenation: Pre-oxygenation with 100% oxygen Induction Type: IV induction Ventilation: Mask ventilation without difficulty Laryngoscope Size: Miller and 2 Grade View: Grade I Tube type: Oral Tube size: 7.0 mm Number of attempts: 1 Airway Equipment and Method: Stylet Placement Confirmation: ETT inserted through vocal cords under direct vision, positive ETCO2 and breath sounds checked- equal and bilateral Secured at: 21 cm Tube secured with: Tape Dental Injury: Teeth and Oropharynx as per pre-operative assessment

## 2022-08-01 NOTE — Evaluation (Signed)
Physical Therapy Evaluation Patient Details Name: Robin Sherman MRN: [id number] DOB: 1961/02/01 Today's Date: 08/01/2022   LEFT KNEE ROM:  0 - 88 degrees AMBULATION DISTANCE: 62 feet using RW with Min guard/Supervision   History of Present Illness  Robin Sherman is a 62 y/o female, s/p Left TKA on 08/01/22, with the diagnosis of osteoarthritis left knee  Clinical Impression  Patient requires Min assist to help move LLE during bed mobility mostly due to increased pain, demonstrates slow labored cadence with fair carryover for left heel to toe stepping due to increasing left knee pain and requested to go back to bed after therapy.  Patient will benefit from continued skilled physical therapy in hospital and recommended venue below to increase strength, balance, endurance for safe ADLs and gait.     Recommendations for follow up therapy are one component of a multi-disciplinary discharge planning process, led by the attending physician.  Recommendations may be updated based on patient status, additional functional criteria and insurance authorization.  Follow Up Recommendations Home health PT      Assistance Recommended at Discharge Set up Supervision/Assistance  Patient can return home with the following  A little help with walking and/or transfers;A little help with bathing/dressing/bathroom;Help with stairs or ramp for entrance;Assistance with cooking/housework    Equipment Recommendations None recommended by PT  Recommendations for Other Services       Functional Status Assessment Patient has had a recent decline in their functional status and demonstrates the ability to make significant improvements in function in a reasonable and predictable amount of time.     Precautions / Restrictions Precautions Precautions: Fall Restrictions Weight Bearing Restrictions: Yes LLE Weight Bearing: Weight bearing as tolerated      Mobility  Bed Mobility Overal bed mobility: Needs  Assistance Bed Mobility: Supine to Sit, Sit to Supine     Supine to sit: Min guard Sit to supine: Supervision   General bed mobility comments: required assist to move LLE due to increasing pain    Transfers Overall transfer level: Needs assistance Equipment used: Rolling walker (2 wheels) Transfers: Sit to/from Stand, Bed to chair/wheelchair/BSC Sit to Stand: Min guard   Step pivot transfers: Min guard       General transfer comment: slow labored movement with limited weightbearing on LLE due to pain    Ambulation/Gait Ambulation/Gait assistance: Supervision, Min guard Gait Distance (Feet): 45 Feet Assistive device: Rolling walker (2 wheels) Gait Pattern/deviations: Decreased step length - right, Decreased step length - left, Decreased stance time - left, Antalgic, Decreased stride length Gait velocity: decreased     General Gait Details: slow slightly labored cadence without loss of balance, limited mostly due to c/o severe pain left knee, fair carryover for left heel to toe stepping  Stairs            Wheelchair Mobility    Modified Rankin (Stroke Patients Only)       Balance Overall balance assessment: Needs assistance Sitting-balance support: Feet supported, No upper extremity supported Sitting balance-Leahy Scale: Fair Sitting balance - Comments: fair/good seated at EOB   Standing balance support: During functional activity, Bilateral upper extremity supported Standing balance-Leahy Scale: Fair Standing balance comment: fair/good using RW                             Pertinent Vitals/Pain Pain Assessment Pain Assessment: Faces Faces Pain Scale: Hurts even more Pain Location: left knee with movement Pain Descriptors / Indicators:  Sore, Grimacing, Guarding Pain Intervention(s): Limited activity within patient's tolerance, Monitored during session, Repositioned    Home Living Family/patient expects to be discharged to:: Private  residence Living Arrangements: Spouse/significant other Available Help at Discharge: Family;Available 24 hours/day Type of Home: House Home Access: Stairs to enter Entrance Stairs-Rails: Right;Left Entrance Stairs-Number of Steps: 3 Alternate Level Stairs-Number of Steps: 12 Home Layout: Two level;Able to live on main level with bedroom/bathroom;Full bath on main level Home Equipment: Rolling Walker (2 wheels);Cane - single point;Toilet riser      Prior Function Prior Level of Function : Independent/Modified Independent             Mobility Comments: Hydrographic surveyor without AD ADLs Comments: Independent     Hand Dominance   Dominant Hand: Right    Extremity/Trunk Assessment   Upper Extremity Assessment Upper Extremity Assessment: Overall WFL for tasks assessed    Lower Extremity Assessment Lower Extremity Assessment: Generalized weakness;LLE deficits/detail LLE Deficits / Details: grossly -4/5 LLE: Unable to fully assess due to pain LLE Sensation: WNL LLE Coordination: WNL    Cervical / Trunk Assessment Cervical / Trunk Assessment: Normal  Communication   Communication: No difficulties  Cognition Arousal/Alertness: Awake/alert Behavior During Therapy: WFL for tasks assessed/performed Overall Cognitive Status: Within Functional Limits for tasks assessed                                          General Comments      Exercises Total Joint Exercises Ankle Circles/Pumps: Supine, 10 reps, Left, Strengthening, AROM Quad Sets: AROM, Strengthening, Left, 10 reps, Supine Short Arc Quad: AAROM, Strengthening, Left, 10 reps, Supine Heel Slides: AROM, Strengthening, Left, 10 reps, Supine Goniometric ROM: Left knee: 0 - 88 degrees   Assessment/Plan    PT Assessment Patient needs continued PT services  PT Problem List Decreased strength;Decreased range of motion;Decreased activity tolerance;Decreased balance;Decreased mobility       PT  Treatment Interventions DME instruction;Gait training;Stair training;Functional mobility training;Therapeutic activities;Therapeutic exercise;Patient/family education;Balance training    PT Goals (Current goals can be found in the Care Plan section)  Acute Rehab PT Goals Patient Stated Goal: return home with family to assist PT Goal Formulation: With patient Time For Goal Achievement: 08/03/22 Potential to Achieve Goals: Good    Frequency BID     Co-evaluation               AM-PAC PT "6 Clicks" Mobility  Outcome Measure Help needed turning from your back to your side while in a flat bed without using bedrails?: None Help needed moving from lying on your back to sitting on the side of a flat bed without using bedrails?: A Little Help needed moving to and from a bed to a chair (including a wheelchair)?: A Little Help needed standing up from a chair using your arms (e.g., wheelchair or bedside chair)?: A Little Help needed to walk in hospital room?: A Little Help needed climbing 3-5 steps with a railing? : A Lot 6 Click Score: 18    End of Session   Activity Tolerance: Patient tolerated treatment well;Patient limited by fatigue;Patient limited by pain Patient left: in bed;with call bell/phone within reach Nurse Communication: Mobility status PT Visit Diagnosis: Unsteadiness on feet (R26.81);Other abnormalities of gait and mobility (R26.89);Muscle weakness (generalized) (M62.81)    Time: 4315-4008 PT Time Calculation (min) (ACUTE ONLY): 30 min   Charges:  PT Evaluation $PT Eval Moderate Complexity: 1 Mod PT Treatments $Therapeutic Activity: 23-37 mins        4:07 PM, 08/01/22 Lonell Grandchild, MPT Physical Therapist with [facility] 336 [phone] office [phone] mobile phone

## 2022-08-01 NOTE — Plan of Care (Signed)
  Problem: Acute Rehab PT Goals(only PT should resolve) Goal: Pt Will Go Supine/Side To Sit Outcome: Progressing Flowsheets (Taken 08/01/2022 1608) Pt will go Supine/Side to Sit: with modified independence Goal: Patient Will Transfer Sit To/From Stand Outcome: Progressing Flowsheets (Taken 08/01/2022 1608) Patient will transfer sit to/from stand: with modified independence Goal: Pt Will Transfer Bed To Chair/Chair To Bed Outcome: Progressing Flowsheets (Taken 08/01/2022 1608) Pt will Transfer Bed to Chair/Chair to Bed: with modified independence Goal: Pt Will Ambulate Outcome: Progressing Flowsheets (Taken 08/01/2022 1608) Pt will Ambulate:  > 125 feet  with modified independence  with rolling walker   4:08 PM, 08/01/22 Lonell Grandchild, MPT Physical Therapist with [facility] 336 [phone] office [phone] mobile phone

## 2022-08-01 NOTE — Progress Notes (Signed)
Instructed on incentive spirometer. 1250 ml obtained. Tolerated well.

## 2022-08-01 NOTE — Transfer of Care (Signed)
Immediate Anesthesia Transfer of Care Note  Patient: Robin Sherman  Procedure(s) Performed: TOTAL KNEE ARTHROPLASTY (Left: Knee)  Patient Location: PACU  Anesthesia Type:General  Level of Consciousness: drowsy and patient cooperative  Airway & Oxygen Therapy: Patient Spontanous Breathing and Patient connected to nasal cannula oxygen  Post-op Assessment: Report given to RN and Post -op Vital signs reviewed and stable  Post vital signs: Reviewed and stable  Last Vitals:  Vitals Value Taken Time  BP    Temp    Pulse 96 08/01/22 1111  Resp 23 08/01/22 1111  SpO2 100 % 08/01/22 1111  Vitals shown include unvalidated device data.  Last Pain:  Vitals:   08/01/22 0645  PainSc: 7          Complications: No notable events documented.

## 2022-08-01 NOTE — Progress Notes (Incomplete)
Patient had Left Knee replacement. At 1730 patient was sitting on the edge of her bed and doing leg exercises. Pain is controlled with pain med every 2 hours

## 2022-08-01 NOTE — Interval H&P Note (Signed)
History and Physical Interval Note:  08/01/2022 7:22 AM  Robin Sherman  has presented today for surgery, with the diagnosis of osteoarthritis left knee.  The various methods of treatment have been discussed with the patient and family. After consideration of risks, benefits and other options for treatment, the patient has consented to  Procedure(s): TOTAL KNEE ARTHROPLASTY (Left) as a surgical intervention.  The patient's history has been reviewed, patient examined, no change in status, stable for surgery.  I have reviewed the patient's chart and labs.  Questions were answered to the patient's satisfaction.     Arther Abbott

## 2022-08-01 NOTE — Anesthesia Preprocedure Evaluation (Addendum)
Anesthesia Evaluation  Patient identified by MRN, date of birth, ID band Patient awake    Reviewed: Allergy & Precautions, H&P , NPO status , Patient's Chart, lab work & pertinent test results, reviewed documented beta blocker date and time   Airway Mallampati: II  TM Distance: >3 FB Neck ROM: full    Dental no notable dental hx.    Pulmonary Current Smoker and Patient abstained from smoking.   Pulmonary exam normal breath sounds clear to auscultation       Cardiovascular Exercise Tolerance: Good hypertension,  Rhythm:regular Rate:Normal     Neuro/Psych  Headaches  Neuromuscular disease  negative psych ROS   GI/Hepatic negative GI ROS, Neg liver ROS,,,  Endo/Other  negative endocrine ROS    Renal/GU negative Renal ROS  negative genitourinary   Musculoskeletal   Abdominal   Peds  Hematology negative hematology ROS (+)   Anesthesia Other Findings   Reproductive/Obstetrics negative OB ROS                             Anesthesia Physical Anesthesia Plan  ASA: 3  Anesthesia Plan: General LMA   Post-op Pain Management:  Regional for Post-op pain and Regional block*   Induction:   PONV Risk Score and Plan: Ondansetron  Airway Management Planned:   Additional Equipment:   Intra-op Plan:   Post-operative Plan:   Informed Consent: I have reviewed the patients History and Physical, chart, labs and discussed the procedure including the risks, benefits and alternatives for the proposed anesthesia with the patient or authorized representative who has indicated his/her understanding and acceptance.     Dental Advisory Given  Plan Discussed with: CRNA  Anesthesia Plan Comments:         Anesthesia Quick Evaluation

## 2022-08-02 ENCOUNTER — Other Ambulatory Visit: Payer: Self-pay | Admitting: Orthopedic Surgery

## 2022-08-02 DIAGNOSIS — M1712 Unilateral primary osteoarthritis, left knee: Secondary | ICD-10-CM | POA: Diagnosis not present

## 2022-08-02 DIAGNOSIS — F17210 Nicotine dependence, cigarettes, uncomplicated: Secondary | ICD-10-CM | POA: Diagnosis not present

## 2022-08-02 DIAGNOSIS — Z91040 Latex allergy status: Secondary | ICD-10-CM | POA: Diagnosis not present

## 2022-08-02 DIAGNOSIS — J45909 Unspecified asthma, uncomplicated: Secondary | ICD-10-CM | POA: Diagnosis not present

## 2022-08-02 DIAGNOSIS — Z7984 Long term (current) use of oral hypoglycemic drugs: Secondary | ICD-10-CM | POA: Diagnosis not present

## 2022-08-02 DIAGNOSIS — I10 Essential (primary) hypertension: Secondary | ICD-10-CM | POA: Diagnosis not present

## 2022-08-02 DIAGNOSIS — Z96651 Presence of right artificial knee joint: Secondary | ICD-10-CM | POA: Diagnosis not present

## 2022-08-02 DIAGNOSIS — Z79899 Other long term (current) drug therapy: Secondary | ICD-10-CM | POA: Diagnosis not present

## 2022-08-02 LAB — BASIC METABOLIC PANEL
Anion gap: 7 (ref 5–15)
BUN: 8 mg/dL (ref 6–20)
CO2: 28 mmol/L (ref 22–32)
Calcium: 8.4 mg/dL — ABNORMAL LOW (ref 8.9–10.3)
Chloride: 105 mmol/L (ref 98–111)
Creatinine, Ser: 0.56 mg/dL (ref 0.44–1.00)
GFR, Estimated: >60 mL/min (ref >60)
Glucose, Bld: 125 mg/dL — ABNORMAL HIGH (ref 70–99)
Potassium: 4.4 mmol/L (ref 3.5–5.1)
Sodium: 140 mmol/L (ref 135–145)

## 2022-08-02 LAB — CBC
HCT: 36 % (ref 36.0–46.0)
Hemoglobin: 11.3 g/dL — ABNORMAL LOW (ref 12.0–15.0)
MCH: 28.2 pg (ref 26.0–34.0)
MCHC: 31.4 g/dL (ref 30.0–36.0)
MCV: 89.8 fL (ref 80.0–100.0)
Platelets: 364 10*3/uL (ref 150–400)
RBC: 4.01 MIL/uL (ref 3.87–5.11)
RDW: 14.6 % (ref 11.5–15.5)
WBC: 18.6 10*3/uL — ABNORMAL HIGH (ref 4.0–10.5)
nRBC: 0 % (ref 0.0–0.2)

## 2022-08-02 MED ORDER — POLYETHYLENE GLYCOL 3350 17 G PO PACK: 17.0000 g | PACK | Freq: Every day | ORAL | 0 refills | Status: DC | PRN

## 2022-08-02 MED ORDER — OXYCODONE HCL 10 MG PO TABS: 10.0000 mg | ORAL_TABLET | ORAL | 0 refills | Status: DC | PRN

## 2022-08-02 MED ORDER — METHOCARBAMOL 500 MG PO TABS: 500.0000 mg | ORAL_TABLET | Freq: Four times a day (QID) | ORAL | 2 refills | Status: DC | PRN

## 2022-08-02 MED ORDER — ASPIRIN 325 MG PO TBEC: 325.0000 mg | DELAYED_RELEASE_TABLET | Freq: Every day | ORAL | 0 refills | Status: DC

## 2022-08-02 MED ORDER — METHOCARBAMOL 500 MG PO TABS: 500.0000 mg | ORAL_TABLET | Freq: Four times a day (QID) | ORAL | 2 refills | Status: AC | PRN

## 2022-08-02 NOTE — Anesthesia Postprocedure Evaluation (Signed)
Anesthesia Post Note  Patient: Robin Sherman  Procedure(s) Performed: TOTAL KNEE ARTHROPLASTY (Left: Knee)  Patient location during evaluation: Phase II Anesthesia Type: Spinal Level of consciousness: awake Pain management: pain level controlled Vital Signs Assessment: post-procedure vital signs reviewed and stable Respiratory status: spontaneous breathing and respiratory function stable Cardiovascular status: blood pressure returned to baseline and stable Postop Assessment: no headache and no apparent nausea or vomiting Anesthetic complications: no Comments: Late entry   No notable events documented.   Last Vitals:  Vitals:   08/02/22 0045 08/02/22 0324  BP: (!) 152/87 129/82  Pulse: 85 95  Resp: 18 18  Temp: 36.7 C (!) 36 C  SpO2: 98% 93%    Last Pain:  Vitals:   08/02/22 0914  TempSrc:   PainSc: [place]

## 2022-08-02 NOTE — TOC Transition Note (Signed)
Transition of Care [facility]) - CM/SW Discharge Note   Patient Details  Name: Robin Sherman MRN: [id number] Date of Birth: [birth year]  [facility] CM/SW Contact:  Iona Beard, [place] Phone Number: 08/02/2022, 1:11 PM  Clinical Narrative:    CSW updated that [facility] is already following pt for [facility] PT. They have the orders needed. CSW updated Marjory Lies that pt has discharged home today. TOC signing off.   Final next level of care: Home w Home Health Services Barriers to Discharge: Barriers Resolved   Patient Goals and CMS Choice CMS Medicare.gov Compare Post Acute Care list provided to:: Patient Choice offered to / list presented to : Patient  Discharge Placement                         Discharge Plan and Services Additional resources added to the After Visit Summary for                            [facility] Arranged: PT [facility] Agency: [place] Date [place]: 08/02/22   Representative spoke with at [place]: Marjory Lies  Social Determinants of Health ([place]) Interventions SDOH Screenings   Food Insecurity: No Food Insecurity (08/01/2022)  Housing: Low Risk  (08/01/2022)  Transportation Needs: No Transportation Needs (08/01/2022)  Utilities: Not At Risk (08/01/2022)  Alcohol Screen: Low Risk  (12/08/2019)  Depression (PHQ2-9): Low Risk  (12/08/2019)  Tobacco Use: High Risk (08/01/2022)     Readmission Risk Interventions     No data to display

## 2022-08-02 NOTE — Progress Notes (Signed)
Physical Therapy Treatment Patient Details Name: Robin Sherman MRN: [id number] DOB: 09-22-1960 Today's Date: 08/02/2022   LEFT KNEE ROM:  0 - 88 degrees AMBULATION DISTANCE: 100 feet using RW with Modified Independence/Supervision   History of Present Illness Robin Sherman is a 62 y/o female, s/p Left TKA on 08/01/22, with the diagnosis of osteoarthritis left knee    PT Comments    Patient demonstrates fair/good return for going up/down steps in stair with step to pattern without loss of balance and understanding acknowledged for position of helper, increased endurance/distance for gait training with fair/good return for left heel to toe stepping and tolerated sitting up in chair with LLE dangling after therapy.  Patient will benefit from continued skilled physical therapy in hospital and recommended venue below to increase strength, balance, endurance for safe ADLs and gait.     Recommendations for follow up therapy are one component of a multi-disciplinary discharge planning process, led by the attending physician.  Recommendations may be updated based on patient status, additional functional criteria and insurance authorization.  Follow Up Recommendations  Home health PT     Assistance Recommended at Discharge Set up Supervision/Assistance  Patient can return home with the following A little help with walking and/or transfers;A little help with bathing/dressing/bathroom;Help with stairs or ramp for entrance;Assistance with cooking/housework   Equipment Recommendations  None recommended by PT    Recommendations for Other Services       Precautions / Restrictions Precautions Precautions: Fall Restrictions Weight Bearing Restrictions: Yes LLE Weight Bearing: Weight bearing as tolerated     Mobility  Bed Mobility Overal bed mobility: Modified Independent                  Transfers Overall transfer level: Modified independent                 General transfer  comment: demonstrates good return for transferring to commode in bathroom and to chair at bedside using RW    Ambulation/Gait Ambulation/Gait assistance: Modified independent (Device/Increase time), Supervision Gait Distance (Feet): 100 Feet Assistive device: Rolling walker (2 wheels) Gait Pattern/deviations: Decreased step length - right, Decreased step length - left, Decreased stance time - left, Antalgic, Decreased stride length Gait velocity: decreased     General Gait Details: increased endurance/distance for gait training with fair/good carryover for left heel to toe stepping without loss of balance   Stairs Stairs: Yes Stairs assistance: Supervision Stair Management: One rail Right, One rail Left, Step to pattern Number of Stairs: 4 General stair comments: demonstrates fair/good return for going up steps using both hands on 1 siderail and going down steps using bilateral siderails without loss of balance and understanding acknolwedged   Wheelchair Mobility    Modified Rankin (Stroke Patients Only)       Balance Overall balance assessment: Needs assistance Sitting-balance support: Feet supported, No upper extremity supported Sitting balance-Leahy Scale: Good Sitting balance - Comments: seated at EOB   Standing balance support: During functional activity, Bilateral upper extremity supported Standing balance-Leahy Scale: Fair Standing balance comment: fair/good using RW                            Cognition Arousal/Alertness: Awake/alert Behavior During Therapy: WFL for tasks assessed/performed Overall Cognitive Status: Within Functional Limits for tasks assessed  Exercises      General Comments        Pertinent Vitals/Pain Pain Assessment Pain Assessment: Faces Faces Pain Scale: Hurts little more Pain Location: left knee with movement Pain Descriptors / Indicators: Sore, Grimacing,  Guarding Pain Intervention(s): Limited activity within patient's tolerance, Monitored during session, Premedicated before session, Repositioned    Home Living                          Prior Function            PT Goals (current goals can now be found in the care plan section) Acute Rehab PT Goals Patient Stated Goal: return home with family to assist PT Goal Formulation: With patient Time For Goal Achievement: 08/03/22 Potential to Achieve Goals: Good Progress towards PT goals: Progressing toward goals    Frequency    BID      PT Plan Current plan remains appropriate    Co-evaluation              AM-PAC PT "6 Clicks" Mobility   Outcome Measure  Help needed turning from your back to your side while in a flat bed without using bedrails?: None Help needed moving from lying on your back to sitting on the side of a flat bed without using bedrails?: None Help needed moving to and from a bed to a chair (including a wheelchair)?: None Help needed standing up from a chair using your arms (e.g., wheelchair or bedside chair)?: None Help needed to walk in hospital room?: A Little Help needed climbing 3-5 steps with a railing? : A Little 6 Click Score: 22    End of Session   Activity Tolerance: Patient tolerated treatment well;Patient limited by fatigue Patient left: in chair;with call bell/phone within reach Nurse Communication: Mobility status PT Visit Diagnosis: Unsteadiness on feet (R26.81);Other abnormalities of gait and mobility (R26.89);Muscle weakness (generalized) (M62.81)     Time: 4680-3212 PT Time Calculation (min) (ACUTE ONLY): 23 min  Charges:  $Gait Training: 8-22 mins $Therapeutic Activity: 8-22 mins                     10:33 AM, 08/02/22 Lonell Grandchild, MPT Physical Therapist with [facility] [phone] office [phone] mobile phone

## 2022-08-03 ENCOUNTER — Telehealth: Payer: Self-pay | Admitting: Orthopedic Surgery

## 2022-08-03 DIAGNOSIS — G4700 Insomnia, unspecified: Secondary | ICD-10-CM | POA: Diagnosis not present

## 2022-08-03 DIAGNOSIS — Z7982 Long term (current) use of aspirin: Secondary | ICD-10-CM | POA: Diagnosis not present

## 2022-08-03 DIAGNOSIS — E559 Vitamin D deficiency, unspecified: Secondary | ICD-10-CM | POA: Diagnosis not present

## 2022-08-03 DIAGNOSIS — G8929 Other chronic pain: Secondary | ICD-10-CM | POA: Diagnosis not present

## 2022-08-03 DIAGNOSIS — F17210 Nicotine dependence, cigarettes, uncomplicated: Secondary | ICD-10-CM | POA: Diagnosis not present

## 2022-08-03 DIAGNOSIS — Z96652 Presence of left artificial knee joint: Secondary | ICD-10-CM | POA: Diagnosis not present

## 2022-08-03 DIAGNOSIS — R7303 Prediabetes: Secondary | ICD-10-CM | POA: Diagnosis not present

## 2022-08-03 DIAGNOSIS — E669 Obesity, unspecified: Secondary | ICD-10-CM | POA: Diagnosis not present

## 2022-08-03 DIAGNOSIS — M48061 Spinal stenosis, lumbar region without neurogenic claudication: Secondary | ICD-10-CM | POA: Diagnosis not present

## 2022-08-03 DIAGNOSIS — Z471 Aftercare following joint replacement surgery: Secondary | ICD-10-CM | POA: Diagnosis not present

## 2022-08-03 DIAGNOSIS — I10 Essential (primary) hypertension: Secondary | ICD-10-CM | POA: Diagnosis not present

## 2022-08-03 DIAGNOSIS — M6530 Trigger finger, unspecified finger: Secondary | ICD-10-CM | POA: Diagnosis not present

## 2022-08-03 DIAGNOSIS — M4310 Spondylolisthesis, site unspecified: Secondary | ICD-10-CM | POA: Diagnosis not present

## 2022-08-03 DIAGNOSIS — M5116 Intervertebral disc disorders with radiculopathy, lumbar region: Secondary | ICD-10-CM | POA: Diagnosis not present

## 2022-08-03 DIAGNOSIS — J4520 Mild intermittent asthma, uncomplicated: Secondary | ICD-10-CM | POA: Diagnosis not present

## 2022-08-03 DIAGNOSIS — G709 Myoneural disorder, unspecified: Secondary | ICD-10-CM | POA: Diagnosis not present

## 2022-08-03 NOTE — Telephone Encounter (Signed)
Patient called and states she is in a lot of pain and needs her [facility] medicine [facility] into [facility] [place] [facility] [address].  I did look and advised her [facility]  [facility] and the doctor needs to call it in again.  Pain Level is a 9 on a scale from 1 to 10  She said she was given so much medicine at the hospital she didn't want to come home and take more pain medicine.  That is why she didn't get her medicine yesterday.    Please call her back at [phone]

## 2022-08-03 NOTE — Telephone Encounter (Signed)
She just got the 10/325 in December and pharmacy is getting a rejection too soon to fill the 10 mg.  I asked what date she can get the 10 mg filled and not until prior auth is done he will start the process and send to [place]

## 2022-08-03 NOTE — Telephone Encounter (Signed)
They sent a [facility] from [facility] asking to change med but he wants her to have this rx so I asked them to start on cover my meds I tried to but dont have the RX bin number or other numbers I need and I couldn't get it to pull up with the last insurance card we have on her which is 62 years old

## 2022-08-03 NOTE — Telephone Encounter (Signed)
I spoke to patient  Completed prior auth on cover my meds

## 2022-08-04 ENCOUNTER — Telehealth: Payer: Self-pay | Admitting: Orthopedic Surgery

## 2022-08-04 DIAGNOSIS — M1712 Unilateral primary osteoarthritis, left knee: Secondary | ICD-10-CM | POA: Diagnosis not present

## 2022-08-04 DIAGNOSIS — Z96652 Presence of left artificial knee joint: Secondary | ICD-10-CM | POA: Diagnosis not present

## 2022-08-04 NOTE — Telephone Encounter (Signed)
Patient returned Amy's call.  Pt's # [phone]

## 2022-08-04 NOTE — Telephone Encounter (Signed)
It was approved I called patient to see if she was able to get meds if not I asked her to call me back

## 2022-08-04 NOTE — Discharge Summary (Addendum)
Physician Discharge Summary  Patient ID: Robin Sherman MRN: [id number] DOB/AGE: 1960/10/16 62 y.o.  Admit date: 08/01/2022 Discharge date: 08/04/2022  Admission Diagnoses: OA LEFT KNEE   Discharge Diagnoses: OA LEFT KNEE   Discharged Condition: good  Procedure: LEFT TKA  DEPUY FB PS   5N F 5T 18 POLY [facility] Course:   DAY 1 SURGERY   DAY 2 PT CLEARED       Latest Ref Rng & Units 08/02/2022    3:20 AM 07/27/2022    1:02 PM 03/31/2021    5:42 AM  CBC  WBC 4.0 - 10.5 K/uL 18.6  12.3  14.5   Hemoglobin 12.0 - 15.0 g/dL 11.3  13.5  12.5   Hematocrit 36.0 - 46.0 % 36.0  43.0  39.4   Platelets 150 - 400 K/uL 364  428  362        Latest Ref Rng & Units 08/02/2022    3:20 AM 07/27/2022    1:02 PM 03/30/2021    5:57 AM  BMP  Glucose 70 - 99 mg/dL 125  125  124   BUN 6 - 20 mg/dL '8  19  8   '$ Creatinine 0.44 - 1.00 mg/dL 0.56  0.89  0.62   Sodium 135 - 145 mmol/L 140  139  136   Potassium 3.5 - 5.1 mmol/L 4.4  3.7  4.3   Chloride 98 - 111 mmol/L 105  104  103   CO2 22 - 32 mmol/L '28  24  27   '$ Calcium 8.9 - 10.3 mg/dL 8.4  8.9  8.2        Discharge Exam: BP 129/82 (BP Location: Left Arm)   Pulse 95   Temp (!) 96.8 F (36 C)   Resp 18   Ht '5\' 4"'$  (1.626 m)   Wt 98 kg   LMP 04/15/2013 Comment: partial  SpO2 93%   BMI 37.09 kg/m  Physical Exam Vitals and nursing note reviewed.  Constitutional:      Appearance: Normal appearance.  HENT:     Head: Normocephalic and atraumatic.  Eyes:     General: No scleral icterus.       Right eye: No discharge.        Left eye: No discharge.     Extraocular Movements: Extraocular movements intact.     Conjunctiva/sclera: Conjunctivae normal.     Pupils: Pupils are equal, round, and reactive to light.  Cardiovascular:     Rate and Rhythm: Normal rate.     Pulses: Normal pulses.  Musculoskeletal:     Comments: LEFT KNEE 0-85   Skin:    General: Skin is warm and dry.     Capillary Refill: Capillary refill takes  less than 2 seconds.  Neurological:     General: No focal deficit present.     Mental Status: She is alert and oriented to person, place, and time.  Psychiatric:        Mood and Affect: Mood normal.        Behavior: Behavior normal.        Thought Content: Thought content normal.        Judgment: Judgment normal.       Disposition: Discharge disposition: 01-Home or Self Care       Discharge Instructions     Call MD / Call 911   Complete by: As directed    If you experience chest pain or shortness of breath, CALL 911 and be transported  to the hospital emergency room.  If you develope a fever above 101 F, pus (white drainage) or increased drainage or redness at the wound, or calf pain, call your surgeon's office.   Change dressing   Complete by: As directed    DO NOT Change dressing   Constipation Prevention   Complete by: As directed    Drink plenty of fluids.  Prune juice may be helpful.  You may use a stool softener, such as Colace (over the counter) 100 mg twice a day.  Use MiraLax (over the counter) for constipation as needed.   Diet - low sodium heart healthy   Complete by: As directed    Discharge instructions   Complete by: As directed    CPM START 0-80 ADV 10 PER DAY AS TOLERATED   USE 6 HRS PER DAY   USE CPM FOR 2 WEEKS   USE BLUE BONE FOAM 30 MIN 3  X A DAY   ICE PAD 4-6 X A DAY   Driving restrictions   Complete by: As directed    No driving for 3 weeks   Increase activity slowly as tolerated   Complete by: As directed    Post-operative opioid taper instructions:   Complete by: As directed    POST-OPERATIVE OPIOID TAPER INSTRUCTIONS: It is important to wean off of your opioid medication as soon as possible. If you do not need pain medication after your surgery it is ok to stop day one. Opioids include: Codeine, Hydrocodone(Norco, Vicodin), Oxycodone(Percocet, oxycontin) and hydromorphone amongst others.  Long term and even short term use of opiods can  cause: Increased pain response Dependence Constipation Depression Respiratory depression And more.  Withdrawal symptoms can include Flu like symptoms Nausea, vomiting And more Techniques to manage these symptoms Hydrate well Eat regular healthy meals Stay active Use relaxation techniques(deep breathing, meditating, yoga) Do Not substitute Alcohol to help with tapering If you have been on opioids for less than two weeks and do not have pain than it is ok to stop all together.  Plan to wean off of opioids This plan should start within one week post op of your joint replacement. Maintain the same interval or time between taking each dose and first decrease the dose.  Cut the total daily intake of opioids by one tablet each day Next start to increase the time between doses. The last dose that should be eliminated is the evening dose.      TED hose   Complete by: As directed    Use stockings (TED hose) for 6 weeks on BOTH leg(s).  You may remove them at night for sleeping.      Allergies as of 08/02/2022       Reactions   Codeine Hives   Acetaminophen Hives, Rash   If take with other medication   Latex Rash   Naproxen Rash   Patient tolerates Ibuprofen/ poss allergy - little blisters on fingers        Medication List     STOP taking these medications    oxyCODONE-acetaminophen 5-325 MG tablet Commonly known as: PERCOCET/ROXICET       TAKE these medications    albuterol 108 (90 Base) MCG/ACT inhaler Commonly known as: VENTOLIN HFA Inhale 2 puffs into the lungs every 6 (six) hours as needed.   gabapentin 400 MG capsule Commonly known as: NEURONTIN TAKE 1 CAPSULE BY MOUTH THREE TIMES A DAY.   hydrOXYzine 25 MG tablet Commonly known as: ATARAX Take 1 tablet (  25 mg total) by mouth 3 (three) times daily as needed. What changed:  how much to take when to take this reasons to take this   lisinopril-hydrochlorothiazide 20-12.5 MG tablet Commonly known as:  ZESTORETIC Take 1 tablet by mouth daily.   metFORMIN 500 MG tablet Commonly known as: GLUCOPHAGE Take 1 tablet (500 mg total) by mouth daily with breakfast.   nitrofurantoin (macrocrystal-monohydrate) 100 MG capsule Commonly known as: MACROBID Take 100 mg by mouth 2 (two) times daily.   VITAMIN C PO Take 1 tablet by mouth daily.   Vitamin D 50 MCG (2000 UT) tablet Take 2,000 Units by mouth daily.               Discharge Care Instructions  (From admission, onward)           Start     Ordered   08/02/22 0000  Change dressing       Comments: DO NOT Change dressing   08/02/22 1042            Follow-up Information     Carole Civil, MD Follow up on 08/16/2022.   Specialties: Orthopedic Surgery, Radiology Why: For wound re-check, For suture removal Contact information: [address] [place] [address] [phone]                 Signed: Arther Abbott 08/04/2022, 7:24 AM

## 2022-08-07 ENCOUNTER — Telehealth: Payer: Self-pay | Admitting: Radiology

## 2022-08-07 ENCOUNTER — Encounter (HOSPITAL_COMMUNITY): Payer: Self-pay | Admitting: Orthopedic Surgery

## 2022-08-07 DIAGNOSIS — M79662 Pain in left lower leg: Secondary | ICD-10-CM

## 2022-08-07 DIAGNOSIS — Z96652 Presence of left artificial knee joint: Secondary | ICD-10-CM

## 2022-08-07 LAB — TYPE AND SCREEN
ABO/RH(D): O POS
Antibody Screen: NEGATIVE
Unit division: 0
Unit division: 0

## 2022-08-07 LAB — BPAM RBC
Blood Product Expiration Date: 202401172359
Blood Product Expiration Date: 202402172359
ISSUE DATE / TIME: 202401162204
Unit Type and Rh: 5100
Unit Type and Rh: 9500

## 2022-08-07 NOTE — Telephone Encounter (Signed)
It is 515 if she is concerned about it she should go to the emergency room and they will start her on a blood thinner and order a DVT study in the morning

## 2022-08-07 NOTE — Telephone Encounter (Signed)
Sonia Side called me and let me know that patient is having some left calf pain worse w/ WTB.  + swelling and it was red but not now, no heat.  No hx of DVT.  Left TKA on 08/01/22.  I called patient verified all this from her.  Please advise?

## 2022-08-08 ENCOUNTER — Ambulatory Visit (HOSPITAL_COMMUNITY)
Admission: RE | Admit: 2022-08-08 | Discharge: 2022-08-08 | Disposition: A | Discharge status: Home / Self Care | Payer: BC Managed Care – PPO | Source: Ambulatory Visit | Attending: Orthopedic Surgery | Admitting: Orthopedic Surgery

## 2022-08-08 DIAGNOSIS — R600 Localized edema: Secondary | ICD-10-CM | POA: Diagnosis not present

## 2022-08-08 DIAGNOSIS — Z96652 Presence of left artificial knee joint: Secondary | ICD-10-CM | POA: Insufficient documentation

## 2022-08-08 DIAGNOSIS — M79662 Pain in left lower leg: Secondary | ICD-10-CM | POA: Diagnosis not present

## 2022-08-08 NOTE — Addendum Note (Signed)
Addended byCandice Camp on: 08/08/2022 08:30 AM   Modules accepted: Orders

## 2022-08-08 NOTE — Telephone Encounter (Signed)
They will do the [place] today at 930 and call me the report at the office

## 2022-08-08 NOTE — Telephone Encounter (Signed)
I called patient to let her know there is no blood clot on the [place] left message told her to wear hose and take the aspirin let me know if she needs anything

## 2022-08-08 NOTE — Telephone Encounter (Addendum)
Since its now the next day I called for a stat US  She said her grand daughter can take her. She said her calf is still sore

## 2022-08-09 DIAGNOSIS — E559 Vitamin D deficiency, unspecified: Secondary | ICD-10-CM | POA: Diagnosis not present

## 2022-08-09 DIAGNOSIS — E669 Obesity, unspecified: Secondary | ICD-10-CM | POA: Diagnosis not present

## 2022-08-09 DIAGNOSIS — M5116 Intervertebral disc disorders with radiculopathy, lumbar region: Secondary | ICD-10-CM | POA: Diagnosis not present

## 2022-08-09 DIAGNOSIS — Z96652 Presence of left artificial knee joint: Secondary | ICD-10-CM | POA: Diagnosis not present

## 2022-08-09 DIAGNOSIS — M4310 Spondylolisthesis, site unspecified: Secondary | ICD-10-CM | POA: Diagnosis not present

## 2022-08-09 DIAGNOSIS — G4700 Insomnia, unspecified: Secondary | ICD-10-CM | POA: Diagnosis not present

## 2022-08-09 DIAGNOSIS — G8929 Other chronic pain: Secondary | ICD-10-CM | POA: Diagnosis not present

## 2022-08-09 DIAGNOSIS — Z7982 Long term (current) use of aspirin: Secondary | ICD-10-CM | POA: Diagnosis not present

## 2022-08-09 DIAGNOSIS — I10 Essential (primary) hypertension: Secondary | ICD-10-CM | POA: Diagnosis not present

## 2022-08-09 DIAGNOSIS — R7303 Prediabetes: Secondary | ICD-10-CM | POA: Diagnosis not present

## 2022-08-09 DIAGNOSIS — Z471 Aftercare following joint replacement surgery: Secondary | ICD-10-CM | POA: Diagnosis not present

## 2022-08-09 DIAGNOSIS — F17210 Nicotine dependence, cigarettes, uncomplicated: Secondary | ICD-10-CM | POA: Diagnosis not present

## 2022-08-09 DIAGNOSIS — M48061 Spinal stenosis, lumbar region without neurogenic claudication: Secondary | ICD-10-CM | POA: Diagnosis not present

## 2022-08-09 DIAGNOSIS — M6530 Trigger finger, unspecified finger: Secondary | ICD-10-CM | POA: Diagnosis not present

## 2022-08-09 DIAGNOSIS — J4520 Mild intermittent asthma, uncomplicated: Secondary | ICD-10-CM | POA: Diagnosis not present

## 2022-08-09 DIAGNOSIS — G709 Myoneural disorder, unspecified: Secondary | ICD-10-CM | POA: Diagnosis not present

## 2022-08-11 DIAGNOSIS — G709 Myoneural disorder, unspecified: Secondary | ICD-10-CM | POA: Diagnosis not present

## 2022-08-11 DIAGNOSIS — M48061 Spinal stenosis, lumbar region without neurogenic claudication: Secondary | ICD-10-CM | POA: Diagnosis not present

## 2022-08-11 DIAGNOSIS — E669 Obesity, unspecified: Secondary | ICD-10-CM | POA: Diagnosis not present

## 2022-08-11 DIAGNOSIS — F17210 Nicotine dependence, cigarettes, uncomplicated: Secondary | ICD-10-CM | POA: Diagnosis not present

## 2022-08-11 DIAGNOSIS — I10 Essential (primary) hypertension: Secondary | ICD-10-CM | POA: Diagnosis not present

## 2022-08-11 DIAGNOSIS — M6530 Trigger finger, unspecified finger: Secondary | ICD-10-CM | POA: Diagnosis not present

## 2022-08-11 DIAGNOSIS — M5116 Intervertebral disc disorders with radiculopathy, lumbar region: Secondary | ICD-10-CM | POA: Diagnosis not present

## 2022-08-11 DIAGNOSIS — R7303 Prediabetes: Secondary | ICD-10-CM | POA: Diagnosis not present

## 2022-08-11 DIAGNOSIS — G8929 Other chronic pain: Secondary | ICD-10-CM | POA: Diagnosis not present

## 2022-08-11 DIAGNOSIS — J4520 Mild intermittent asthma, uncomplicated: Secondary | ICD-10-CM | POA: Diagnosis not present

## 2022-08-11 DIAGNOSIS — M4310 Spondylolisthesis, site unspecified: Secondary | ICD-10-CM | POA: Diagnosis not present

## 2022-08-11 DIAGNOSIS — E559 Vitamin D deficiency, unspecified: Secondary | ICD-10-CM | POA: Diagnosis not present

## 2022-08-11 DIAGNOSIS — Z7982 Long term (current) use of aspirin: Secondary | ICD-10-CM | POA: Diagnosis not present

## 2022-08-11 DIAGNOSIS — Z471 Aftercare following joint replacement surgery: Secondary | ICD-10-CM | POA: Diagnosis not present

## 2022-08-11 DIAGNOSIS — Z96652 Presence of left artificial knee joint: Secondary | ICD-10-CM | POA: Diagnosis not present

## 2022-08-11 DIAGNOSIS — G4700 Insomnia, unspecified: Secondary | ICD-10-CM | POA: Diagnosis not present

## 2022-08-12 DIAGNOSIS — M48061 Spinal stenosis, lumbar region without neurogenic claudication: Secondary | ICD-10-CM | POA: Diagnosis not present

## 2022-08-12 DIAGNOSIS — E559 Vitamin D deficiency, unspecified: Secondary | ICD-10-CM | POA: Diagnosis not present

## 2022-08-12 DIAGNOSIS — I10 Essential (primary) hypertension: Secondary | ICD-10-CM | POA: Diagnosis not present

## 2022-08-12 DIAGNOSIS — G4700 Insomnia, unspecified: Secondary | ICD-10-CM | POA: Diagnosis not present

## 2022-08-12 DIAGNOSIS — R7303 Prediabetes: Secondary | ICD-10-CM | POA: Diagnosis not present

## 2022-08-12 DIAGNOSIS — J4520 Mild intermittent asthma, uncomplicated: Secondary | ICD-10-CM | POA: Diagnosis not present

## 2022-08-12 DIAGNOSIS — Z7982 Long term (current) use of aspirin: Secondary | ICD-10-CM | POA: Diagnosis not present

## 2022-08-12 DIAGNOSIS — Z96652 Presence of left artificial knee joint: Secondary | ICD-10-CM | POA: Diagnosis not present

## 2022-08-12 DIAGNOSIS — E669 Obesity, unspecified: Secondary | ICD-10-CM | POA: Diagnosis not present

## 2022-08-12 DIAGNOSIS — M4310 Spondylolisthesis, site unspecified: Secondary | ICD-10-CM | POA: Diagnosis not present

## 2022-08-12 DIAGNOSIS — F17210 Nicotine dependence, cigarettes, uncomplicated: Secondary | ICD-10-CM | POA: Diagnosis not present

## 2022-08-12 DIAGNOSIS — G709 Myoneural disorder, unspecified: Secondary | ICD-10-CM | POA: Diagnosis not present

## 2022-08-12 DIAGNOSIS — Z471 Aftercare following joint replacement surgery: Secondary | ICD-10-CM | POA: Diagnosis not present

## 2022-08-12 DIAGNOSIS — M5116 Intervertebral disc disorders with radiculopathy, lumbar region: Secondary | ICD-10-CM | POA: Diagnosis not present

## 2022-08-12 DIAGNOSIS — G8929 Other chronic pain: Secondary | ICD-10-CM | POA: Diagnosis not present

## 2022-08-12 DIAGNOSIS — M6530 Trigger finger, unspecified finger: Secondary | ICD-10-CM | POA: Diagnosis not present

## 2022-08-14 ENCOUNTER — Other Ambulatory Visit: Payer: Self-pay | Admitting: Orthopedic Surgery

## 2022-08-14 DIAGNOSIS — Z96652 Presence of left artificial knee joint: Secondary | ICD-10-CM | POA: Diagnosis not present

## 2022-08-14 DIAGNOSIS — J4520 Mild intermittent asthma, uncomplicated: Secondary | ICD-10-CM | POA: Diagnosis not present

## 2022-08-14 DIAGNOSIS — I10 Essential (primary) hypertension: Secondary | ICD-10-CM | POA: Diagnosis not present

## 2022-08-14 DIAGNOSIS — G4700 Insomnia, unspecified: Secondary | ICD-10-CM | POA: Diagnosis not present

## 2022-08-14 DIAGNOSIS — M48061 Spinal stenosis, lumbar region without neurogenic claudication: Secondary | ICD-10-CM | POA: Diagnosis not present

## 2022-08-14 DIAGNOSIS — Z7982 Long term (current) use of aspirin: Secondary | ICD-10-CM | POA: Diagnosis not present

## 2022-08-14 DIAGNOSIS — M4310 Spondylolisthesis, site unspecified: Secondary | ICD-10-CM | POA: Diagnosis not present

## 2022-08-14 DIAGNOSIS — E669 Obesity, unspecified: Secondary | ICD-10-CM | POA: Diagnosis not present

## 2022-08-14 DIAGNOSIS — G709 Myoneural disorder, unspecified: Secondary | ICD-10-CM | POA: Diagnosis not present

## 2022-08-14 DIAGNOSIS — F17210 Nicotine dependence, cigarettes, uncomplicated: Secondary | ICD-10-CM | POA: Diagnosis not present

## 2022-08-14 DIAGNOSIS — R7303 Prediabetes: Secondary | ICD-10-CM | POA: Diagnosis not present

## 2022-08-14 DIAGNOSIS — G8929 Other chronic pain: Secondary | ICD-10-CM | POA: Diagnosis not present

## 2022-08-14 DIAGNOSIS — M6530 Trigger finger, unspecified finger: Secondary | ICD-10-CM | POA: Diagnosis not present

## 2022-08-14 DIAGNOSIS — E559 Vitamin D deficiency, unspecified: Secondary | ICD-10-CM | POA: Diagnosis not present

## 2022-08-14 DIAGNOSIS — Z471 Aftercare following joint replacement surgery: Secondary | ICD-10-CM | POA: Diagnosis not present

## 2022-08-14 DIAGNOSIS — M5116 Intervertebral disc disorders with radiculopathy, lumbar region: Secondary | ICD-10-CM | POA: Diagnosis not present

## 2022-08-14 NOTE — Telephone Encounter (Signed)
The patient called, requesting pain medication, but she wants the white pills not the pink.  She couldn't tell me what kind the white pills are and I don't know.  [facility] uses [facility] in [place].

## 2022-08-15 ENCOUNTER — Other Ambulatory Visit: Payer: Self-pay | Admitting: Orthopedic Surgery

## 2022-08-15 DIAGNOSIS — Z96652 Presence of left artificial knee joint: Secondary | ICD-10-CM

## 2022-08-15 MED ORDER — OXYCODONE HCL 10 MG PO TABS: 10.0000 mg | ORAL_TABLET | Freq: Four times a day (QID) | ORAL | 0 refills | Status: AC | PRN

## 2022-08-15 NOTE — Progress Notes (Signed)
60 mg oxy per day   Decreased to 40 mg per day

## 2022-08-15 NOTE — Telephone Encounter (Signed)
Methocarbamol is pink Oxycodone is white?? She should have enough Methocarbamol

## 2022-08-16 ENCOUNTER — Ambulatory Visit (INDEPENDENT_AMBULATORY_CARE_PROVIDER_SITE_OTHER): Payer: BC Managed Care – PPO | Admitting: Orthopedic Surgery

## 2022-08-16 ENCOUNTER — Encounter: Payer: Self-pay | Admitting: Orthopedic Surgery

## 2022-08-16 DIAGNOSIS — M48061 Spinal stenosis, lumbar region without neurogenic claudication: Secondary | ICD-10-CM | POA: Diagnosis not present

## 2022-08-16 DIAGNOSIS — M5432 Sciatica, left side: Secondary | ICD-10-CM

## 2022-08-16 DIAGNOSIS — M5116 Intervertebral disc disorders with radiculopathy, lumbar region: Secondary | ICD-10-CM | POA: Diagnosis not present

## 2022-08-16 DIAGNOSIS — J4520 Mild intermittent asthma, uncomplicated: Secondary | ICD-10-CM | POA: Diagnosis not present

## 2022-08-16 DIAGNOSIS — F17210 Nicotine dependence, cigarettes, uncomplicated: Secondary | ICD-10-CM | POA: Diagnosis not present

## 2022-08-16 DIAGNOSIS — R7303 Prediabetes: Secondary | ICD-10-CM | POA: Diagnosis not present

## 2022-08-16 DIAGNOSIS — M4310 Spondylolisthesis, site unspecified: Secondary | ICD-10-CM | POA: Diagnosis not present

## 2022-08-16 DIAGNOSIS — Z96652 Presence of left artificial knee joint: Secondary | ICD-10-CM

## 2022-08-16 DIAGNOSIS — G709 Myoneural disorder, unspecified: Secondary | ICD-10-CM | POA: Diagnosis not present

## 2022-08-16 DIAGNOSIS — E559 Vitamin D deficiency, unspecified: Secondary | ICD-10-CM | POA: Diagnosis not present

## 2022-08-16 DIAGNOSIS — G4700 Insomnia, unspecified: Secondary | ICD-10-CM | POA: Diagnosis not present

## 2022-08-16 DIAGNOSIS — G8929 Other chronic pain: Secondary | ICD-10-CM | POA: Diagnosis not present

## 2022-08-16 DIAGNOSIS — M1712 Unilateral primary osteoarthritis, left knee: Secondary | ICD-10-CM

## 2022-08-16 DIAGNOSIS — M6530 Trigger finger, unspecified finger: Secondary | ICD-10-CM | POA: Diagnosis not present

## 2022-08-16 DIAGNOSIS — Z7982 Long term (current) use of aspirin: Secondary | ICD-10-CM | POA: Diagnosis not present

## 2022-08-16 DIAGNOSIS — I10 Essential (primary) hypertension: Secondary | ICD-10-CM | POA: Diagnosis not present

## 2022-08-16 DIAGNOSIS — Z471 Aftercare following joint replacement surgery: Secondary | ICD-10-CM | POA: Diagnosis not present

## 2022-08-16 DIAGNOSIS — E669 Obesity, unspecified: Secondary | ICD-10-CM | POA: Diagnosis not present

## 2022-08-16 MED ORDER — PREDNISONE 10 MG (48) PO TBPK: ORAL_TABLET | Freq: Every day | ORAL | 0 refills | Status: DC

## 2022-08-16 NOTE — Progress Notes (Unsigned)
Postop visit #1 status post left total knee arthroplasty  The patient had a DePuy attune fixed-bearing posterior stabilized total knee  Sizes 5 narrow femur 5 tibia, 38 patella, 20 polyethylene  Current medication oxycodone 10 mg recently received new prescription with opioid taper 4 times a day decreasing from 60 mg a day  Her incision was clean dry and intact the staples were extracted.  Range of motion is 0-85  Recommend she continue physical therapy advance as tolerated return in 3 to 4 weeks

## 2022-08-16 NOTE — Patient Instructions (Signed)
Physical therapy has been ordered for you at [facility]. They should call you to schedule, [phone] is the phone number to call, call today/ now to schedule.

## 2022-08-18 DIAGNOSIS — Z7982 Long term (current) use of aspirin: Secondary | ICD-10-CM | POA: Diagnosis not present

## 2022-08-18 DIAGNOSIS — E559 Vitamin D deficiency, unspecified: Secondary | ICD-10-CM | POA: Diagnosis not present

## 2022-08-18 DIAGNOSIS — J4520 Mild intermittent asthma, uncomplicated: Secondary | ICD-10-CM | POA: Diagnosis not present

## 2022-08-18 DIAGNOSIS — Z471 Aftercare following joint replacement surgery: Secondary | ICD-10-CM | POA: Diagnosis not present

## 2022-08-18 DIAGNOSIS — E669 Obesity, unspecified: Secondary | ICD-10-CM | POA: Diagnosis not present

## 2022-08-18 DIAGNOSIS — G4700 Insomnia, unspecified: Secondary | ICD-10-CM | POA: Diagnosis not present

## 2022-08-18 DIAGNOSIS — M4310 Spondylolisthesis, site unspecified: Secondary | ICD-10-CM | POA: Diagnosis not present

## 2022-08-18 DIAGNOSIS — F17210 Nicotine dependence, cigarettes, uncomplicated: Secondary | ICD-10-CM | POA: Diagnosis not present

## 2022-08-18 DIAGNOSIS — M5116 Intervertebral disc disorders with radiculopathy, lumbar region: Secondary | ICD-10-CM | POA: Diagnosis not present

## 2022-08-18 DIAGNOSIS — M6530 Trigger finger, unspecified finger: Secondary | ICD-10-CM | POA: Diagnosis not present

## 2022-08-18 DIAGNOSIS — I10 Essential (primary) hypertension: Secondary | ICD-10-CM | POA: Diagnosis not present

## 2022-08-18 DIAGNOSIS — M48061 Spinal stenosis, lumbar region without neurogenic claudication: Secondary | ICD-10-CM | POA: Diagnosis not present

## 2022-08-18 DIAGNOSIS — G8929 Other chronic pain: Secondary | ICD-10-CM | POA: Diagnosis not present

## 2022-08-18 DIAGNOSIS — G709 Myoneural disorder, unspecified: Secondary | ICD-10-CM | POA: Diagnosis not present

## 2022-08-18 DIAGNOSIS — Z96652 Presence of left artificial knee joint: Secondary | ICD-10-CM | POA: Diagnosis not present

## 2022-08-18 DIAGNOSIS — R7303 Prediabetes: Secondary | ICD-10-CM | POA: Diagnosis not present

## 2022-08-21 DIAGNOSIS — M5116 Intervertebral disc disorders with radiculopathy, lumbar region: Secondary | ICD-10-CM | POA: Diagnosis not present

## 2022-08-21 DIAGNOSIS — E669 Obesity, unspecified: Secondary | ICD-10-CM | POA: Diagnosis not present

## 2022-08-21 DIAGNOSIS — G4700 Insomnia, unspecified: Secondary | ICD-10-CM | POA: Diagnosis not present

## 2022-08-21 DIAGNOSIS — F17210 Nicotine dependence, cigarettes, uncomplicated: Secondary | ICD-10-CM | POA: Diagnosis not present

## 2022-08-21 DIAGNOSIS — J4520 Mild intermittent asthma, uncomplicated: Secondary | ICD-10-CM | POA: Diagnosis not present

## 2022-08-21 DIAGNOSIS — M48061 Spinal stenosis, lumbar region without neurogenic claudication: Secondary | ICD-10-CM | POA: Diagnosis not present

## 2022-08-21 DIAGNOSIS — M4310 Spondylolisthesis, site unspecified: Secondary | ICD-10-CM | POA: Diagnosis not present

## 2022-08-21 DIAGNOSIS — Z7982 Long term (current) use of aspirin: Secondary | ICD-10-CM | POA: Diagnosis not present

## 2022-08-21 DIAGNOSIS — G8929 Other chronic pain: Secondary | ICD-10-CM | POA: Diagnosis not present

## 2022-08-21 DIAGNOSIS — M6530 Trigger finger, unspecified finger: Secondary | ICD-10-CM | POA: Diagnosis not present

## 2022-08-21 DIAGNOSIS — Z96652 Presence of left artificial knee joint: Secondary | ICD-10-CM | POA: Diagnosis not present

## 2022-08-21 DIAGNOSIS — I10 Essential (primary) hypertension: Secondary | ICD-10-CM | POA: Diagnosis not present

## 2022-08-21 DIAGNOSIS — Z471 Aftercare following joint replacement surgery: Secondary | ICD-10-CM | POA: Diagnosis not present

## 2022-08-21 DIAGNOSIS — E559 Vitamin D deficiency, unspecified: Secondary | ICD-10-CM | POA: Diagnosis not present

## 2022-08-21 DIAGNOSIS — R7303 Prediabetes: Secondary | ICD-10-CM | POA: Diagnosis not present

## 2022-08-21 DIAGNOSIS — G709 Myoneural disorder, unspecified: Secondary | ICD-10-CM | POA: Diagnosis not present

## 2022-08-22 ENCOUNTER — Other Ambulatory Visit: Payer: Self-pay | Admitting: Orthopedic Surgery

## 2022-08-22 ENCOUNTER — Other Ambulatory Visit: Payer: Self-pay

## 2022-08-22 ENCOUNTER — Ambulatory Visit (HOSPITAL_COMMUNITY): Payer: BC Managed Care – PPO | Attending: Orthopedic Surgery

## 2022-08-22 DIAGNOSIS — M25562 Pain in left knee: Secondary | ICD-10-CM | POA: Diagnosis not present

## 2022-08-22 DIAGNOSIS — R29898 Other symptoms and signs involving the musculoskeletal system: Secondary | ICD-10-CM | POA: Diagnosis not present

## 2022-08-22 DIAGNOSIS — M1712 Unilateral primary osteoarthritis, left knee: Secondary | ICD-10-CM | POA: Diagnosis not present

## 2022-08-22 DIAGNOSIS — R262 Difficulty in walking, not elsewhere classified: Secondary | ICD-10-CM | POA: Diagnosis not present

## 2022-08-22 DIAGNOSIS — M6281 Muscle weakness (generalized): Secondary | ICD-10-CM | POA: Diagnosis not present

## 2022-08-22 MED ORDER — METHOCARBAMOL 500 MG PO TABS: 500.0000 mg | ORAL_TABLET | Freq: Three times a day (TID) | ORAL | 1 refills | Status: DC

## 2022-08-22 MED ORDER — OXYCODONE-ACETAMINOPHEN 5-325 MG PO TABS: 1.0000 | ORAL_TABLET | Freq: Four times a day (QID) | ORAL | 0 refills | Status: DC | PRN

## 2022-08-22 NOTE — Telephone Encounter (Signed)
Patient called, requested a refill for Oxycodone 5-325, quantity 30, every 4 hours and she'd like a refill on her muscle relaxer to [facility] [place].  Pt's # [phone]

## 2022-08-22 NOTE — Therapy (Signed)
OUTPATIENT PHYSICAL THERAPY LOWER EXTREMITY EVALUATION   Patient Name: Robin Sherman MRN: [id number] DOB:[birth year], 61 y.o., female Today's Date: 08/22/2022  END OF SESSION:  PT End of Session - 08/22/22 1436     Visit Number 1    Number of Visits 8    Date for PT Re-Evaluation 09/19/22    Authorization Type [employer] PPO    Authorization Time Period 60 visit limit; no auth; no copay    Authorization - Visit Number 1    Authorization - Number of Visits 60    Progress Note Due on Visit 8    PT Start Time 1435    PT Stop Time 1515    PT Time Calculation (min) 40 min    Activity Tolerance Patient tolerated treatment well    Behavior During Therapy WFL for tasks assessed/performed             Past Medical History:  Diagnosis Date   Allergy    pollen   Arthritis    left knee   Asthma    Eczema    Followed by Dr. Nevada Crane dermatology   Encounter for screening colonoscopy 02/09/2020   Headache(784.0) 09/25/2012   Hypertension    Neuromuscular disorder ([place])    Pre-diabetes    sciatica right side 10/13/2013   TRIGGER FINGER 11/03/2008   Qualifier: Diagnosis of  By: Aline Brochure MD, Dorothyann Peng     Past Surgical History:  Procedure Laterality Date   ABDOMINAL HYSTERECTOMY     bleeding   BILATERAL SALPINGECTOMY Bilateral 08/01/2013   Procedure: BILATERAL SALPINGECTOMY;  Surgeon: Jonnie Kind, MD;  Location: [facility];  Service: Gynecology;  Laterality: Bilateral;   CHONDROPLASTY Left 01/14/2014   Procedure: CHONDROPLASTY OF FEMUR;  Surgeon: Carole Civil, MD;  Location: [facility];  Service: Orthopedics;  Laterality: Left;   CHONDROPLASTY Right 12/12/2018   Procedure: CHONDROPLASTY;  Surgeon: Carole Civil, MD;  Location: [facility];  Service: Orthopedics;  Laterality: Right;   COLONOSCOPY N/A 01/27/2013   Dr. Oneida Alar: Right colon with poor prep (patient ate [place] necks the night before the procedure).  Moderate diverticulosis in the sigmoid colon, moderate hemorrhoids.    COLONOSCOPY WITH PROPOFOL N/A 04/06/2020   Procedure: COLONOSCOPY WITH PROPOFOL;  Surgeon: Eloise Harman, DO;  [facility];  Service: Endoscopy;  Laterality: N/A;  8:15am   HEMATOMA EVACUATION N/A 08/03/2013   Procedure: EVACUATION PELVIC HEMATOMA;  Surgeon: Jonnie Kind, MD;  Location: [facility];  Service: Gynecology;  Laterality: N/A;   KNEE ARTHROSCOPY WITH MEDIAL MENISECTOMY Left 01/14/2014   Procedure: KNEE ARTHROSCOPY WITH PARTIAL MEDIAL MENISECTOMY;  Surgeon: Carole Civil, MD;  Location: [facility];  Service: Orthopedics;  Laterality: Left;   KNEE ARTHROSCOPY WITH MEDIAL MENISECTOMY Right 12/12/2018   Procedure: KNEE ARTHROSCOPY WITH MEDIAL MENISCECTOMY;  Surgeon: Carole Civil, MD;  Location: [facility];  Service: Orthopedics;  Laterality: Right;   POLYPECTOMY  04/06/2020   Procedure: POLYPECTOMY;  Surgeon: Eloise Harman, DO;  [facility];  Service: Endoscopy;;   SUPRACERVICAL ABDOMINAL HYSTERECTOMY N/A 08/01/2013   Procedure: HYSTERECTOMY SUPRACERVICAL ABDOMINAL;  Surgeon: Jonnie Kind, MD;  Location: [facility];  Service: Gynecology;  Laterality: N/A;   TOTAL KNEE ARTHROPLASTY Right 03/29/2021   Procedure: TOTAL KNEE ARTHROPLASTY;  Surgeon: Carole Civil, MD;  Location: [facility];  Service: Orthopedics;  Laterality: Right;   TOTAL KNEE ARTHROPLASTY Left 08/01/2022   Procedure: TOTAL KNEE ARTHROPLASTY;  Surgeon: Carole Civil, MD;  Location: [facility];  Service: Orthopedics;  Laterality: Left;   TRANSFORAMINAL LUMBAR INTERBODY FUSION W/ MIS 1 LEVEL Right 06/14/2020   Procedure: Right Lumbar Five- Sacral One Minimally invasive transforaminal lumbar interbody fusion;  Surgeon: Judith Part, MD;  Location: [facility];  Service: Neurosurgery;  Laterality: Right;  Right Lumbar Five- Sacral One Minimally invasive transforaminal lumbar interbody fusion   TUBAL LIGATION     [place]   Patient Active Problem List   Diagnosis Date Noted   Osteoarthritis  of left knee 08/01/2022   Left knee pain 06/14/2022   S/P TKR (total knee replacement), right 03/29/2021   Isthmic spondylolisthesis 06/14/2020   Polypharmacy 02/09/2020   Encounter for screening colonoscopy 02/09/2020   DDD (degenerative disc disease), lumbar 12/08/2019   S/P right knee arthroscopy 12/12/18 *with chondroplasty patella  12/19/2018   Borderline diabetes 07/02/2015   Vitamin D deficiency 07/02/2015   Lumbar back pain 07/02/2015   Insomnia 10/29/2013   Arthritis of knee, degenerative 10/13/2013   Hand dermatitis 03/02/2012   Essential hypertension, benign 09/21/2011   Class 2 obesity 09/21/2011   Shoulder pain, right 09/21/2011   Asthma, intermittent 09/21/2011   Tobacco user 09/21/2011    PCP: Celene Squibb, MD  REFERRING PROVIDER: Carole Civil, [place]  REFERRING DIAG: [phone] (ICD-10-CM) - Unilateral primary osteoarthritis, left knee  THERAPY DIAG:  Primary osteoarthritis of left knee  Difficulty in walking, not elsewhere classified  Acute pain of left knee  Rationale for Evaluation and Treatment: Rehabilitation  ONSET DATE: 08/01/22  SUBJECTIVE:   SUBJECTIVE STATEMENT: Slipped and fell on ice; twisted left knee and someone fell on top of her knee; knee never got ok and so had TKA  PERTINENT HISTORY: Right knee 2022 TKA Lumbar fusion 2021  PAIN:  Are you having pain? Yes: NPRS scale: 6-8/10 Pain location: knee and lower leg Pain description: sore, aching and tight Aggravating factors: walking Relieving factors: medication  PRECAUTIONS: None  WEIGHT BEARING RESTRICTIONS: No  FALLS:  Has patient fallen in last 6 months? Yes. Number of falls 1  LIVING ENVIRONMENT: Lives with: lives with their family and lives with their spouse Lives in: House/apartment Stairs: Yes: External: 2 steps; on right going up, on left going up, and bilateral but cannot reach both Has following equipment at home: Single point cane  and Walker - 2 wheeled  OCCUPATION: CNA  PLOF: Independent  PATIENT GOALS: to be able to walk  NEXT MD VISIT: 09/07/2022  OBJECTIVE:   DIAGNOSTIC FINDINGS: IMPRESSION: Status post tricompartmental knee replacement without immediate complicating feature.  CLINICAL DATA:  Left lower extremity pain and edema. History of left knee replacement. Evaluate for acute or chronic DVT.   EXAM: LEFT LOWER EXTREMITY VENOUS DOPPLER ULTRASOUND   TECHNIQUE: Gray-scale sonography with graded compression, as well as color Doppler and duplex ultrasound were performed to evaluate the lower extremity deep venous systems from the level of the common femoral vein and including the common femoral, femoral, profunda femoral, popliteal and calf veins including the posterior tibial, peroneal and gastrocnemius veins when visible. The superficial great saphenous vein was also interrogated. Spectral Doppler was utilized to evaluate flow at rest and with distal augmentation maneuvers in the common femoral, femoral and popliteal veins.   COMPARISON:  Left lower extremity venous Doppler ultrasound-10/01/2013 (negative).   FINDINGS: Contralateral Common Femoral Vein: Respiratory phasicity is normal and symmetric with the symptomatic side. No evidence of thrombus. Normal compressibility.   Common Femoral Vein: No evidence  of thrombus. Normal compressibility, respiratory phasicity and response to augmentation.   Saphenofemoral Junction: No evidence of thrombus. Normal compressibility and flow on color Doppler imaging.   Profunda Femoral Vein: No evidence of thrombus. Normal compressibility and flow on color Doppler imaging.   Femoral Vein: No evidence of thrombus. Normal compressibility, respiratory phasicity and response to augmentation.   Popliteal Vein: No evidence of thrombus. Normal compressibility, respiratory phasicity and response to augmentation.   Calf Veins: No evidence of thrombus.  Normal compressibility and flow on color Doppler imaging.   Superficial Great Saphenous Vein: No evidence of thrombus. Normal compressibility.   Other Findings:  None.   IMPRESSION: No evidence of acute or chronic DVT within the left lower extremity.     Electronically Signed   By: Sandi Mariscal M.D.   On: 08/08/2022 10:24      PATIENT SURVEYS:  FOTO 53  COGNITION: Overall cognitive status: Within functional limits for tasks assessed     SENSATION: Some into left leg and foot but is normalizing  EDEMA:  Yes normal for this time s/p    PALPATION: General soreness only  LOWER EXTREMITY ROM:  Active ROM Right eval Left eval  Hip flexion    Hip extension    Hip abduction    Hip adduction    Hip internal rotation    Hip external rotation    Knee flexion  91  Knee extension  -10  Ankle dorsiflexion    Ankle plantarflexion    Ankle inversion    Ankle eversion     (Blank rows = not tested)  LOWER EXTREMITY MMT:  MMT Right eval Left eval  Hip flexion    Hip extension    Hip abduction    Hip adduction    Hip internal rotation    Hip external rotation    Knee flexion  Sitting 4-  Knee extension  4  Ankle dorsiflexion  4+  Ankle plantarflexion    Ankle inversion    Ankle eversion     (Blank rows = not tested)   FUNCTIONAL TESTS:  5 times sit to stand: 20.42 sec 2 minute walk test: with SPC 280 ft  GAIT: Distance walked: 280 ft Assistive device utilized: Single point cane Level of assistance: Modified independence Comments: slow gait speed; decreased gait speed   TODAY'S TREATMENT:                                                                                                                              DATE: 08/22/22 physical therapy evaluation and HEP instruction    PATIENT EDUCATION:  Education details: Patient educated on exam findings, POC, scope of PT, HEP, and what to expect next week. Person educated: Patient Education method:  Explanation, Demonstration, and Handouts Education comprehension: verbalized understanding, returned demonstration, verbal cues required, and tactile cues required   HOME EXERCISE PROGRAM: Access Code: [email or web address] URL: [email or web address] Date: 08/22/2022 Prepared by: [facility]  Exercises - Supine Ankle Pumps  - 2 x daily - 7 x weekly - 1 sets - 15 reps - Supine Quadricep Sets  - 2 x daily - 7 x weekly - 1 sets - 15 reps - Supine Active Straight Leg Raise  - 2 x daily - 7 x weekly - 1 sets - 15 reps - Supine Short Arc Quad  - 2 x daily - 7 x weekly - 1 sets - 15 reps - Supine Heel Slides  - 2 x daily - 7 x weekly - 1 sets - 15 reps - Seated Long Arc Quad  - 2 x daily - 7 x weekly - 1 sets - 10 reps - 2 sec hold - Seated Knee Flexion Stretch  - 1 x daily - 7 x weekly - 1 sets - 15 reps - Heel Raises with Counter Support  - 1 x daily - 7 x weekly - 1 sets - 10 reps  ASSESSMENT:  CLINICAL IMPRESSION: Patient is a 62 y.o. female who was seen today for physical therapy evaluation and treatment for M17.12 (ICD-10-CM) - Unilateral primary osteoarthritis, left knee. Patient demonstrates muscle weakness, reduced ROM, and fascial restrictions which are likely contributing to symptoms of pain and are negatively impacting patient ability to perform ADLs and functional mobility tasks. Patient will benefit from skilled physical therapy services to address these deficits to reduce pain and improve level of function with ADLs and functional mobility tasks.   OBJECTIVE IMPAIRMENTS: Abnormal gait, decreased activity tolerance, decreased endurance, decreased mobility, difficulty walking, decreased ROM, decreased strength, hypomobility, increased edema, increased fascial restrictions, impaired perceived functional ability, increased muscle spasms, impaired flexibility, and pain.   ACTIVITY LIMITATIONS: carrying, lifting, bending, sitting, standing, squatting, sleeping, stairs, transfers,  bathing, toileting, locomotion level, and caring for others  PARTICIPATION LIMITATIONS: meal prep, cleaning, laundry, driving, shopping, community activity, occupation, and yard work   [employer] POTENTIAL: Good  CLINICAL DECISION MAKING: Stable/uncomplicated  EVALUATION COMPLEXITY: Low   GOALS: Goals reviewed with patient? No  SHORT TERM GOALS: Target date: 09/05/2022 patient will be independent with initial HEP  Baseline: Goal status: INITIAL  2.  Patient will improve left knee extension to -5 (lacking) to normalize gait pattern/ stance phase with household ambulation Baseline:  Goal status: INITIAL  LONG TERM GOALS: Target date: 09/19/2022  Patient will be independent in self management strategies to improve quality of life and functional outcomes.   Baseline:  Goal status: INITIAL  2.  Patient will increase  leg MMTs to 5/5 without pain to promote return to ambulation community distances with minimal deviation.    Baseline:  Goal status: INITIAL  3.  Patient will increased knee mobility to -2 to 120 to promote normal navigation of steps; step over step pattern Baseline:  Goal status: INITIAL  4.  Patient will improve 5 times sit to stand score from 20.42 sec to 15 sec  or less to demonstrate improved functional mobility and increased lower extremity strength.  Baseline:  Goal status: INITIAL  5.  Patient will increase 2 MWT to 350 ft to demonstrate improved functional mobility with community ambulation Baseline:  Goal status: INITIAL  PLAN:  PT FREQUENCY: 2x/week  PT DURATION: 4 weeks  PLANNED INTERVENTIONS: Therapeutic exercises, Therapeutic activity, Neuromuscular re-education, Balance training, Gait training, Patient/Family education, Joint manipulation, Joint mobilization, Stair training, Orthotic/Fit training, DME instructions, Aquatic Therapy, Dry Needling, Electrical stimulation, Spinal manipulation, Spinal mobilization, Cryotherapy, Moist heat,  Compression bandaging, scar mobilization, Splintting, Taping, Traction, Ultrasound, Ionotophoresis '4mg'$ /ml  Dexamethasone, and Manual therapy   PLAN FOR NEXT SESSION: Review goals and HEP; progress knee mobility and strength, gait training, balance, transfers   3:07 PM, 08/22/22 Monterius Rolf Small Shrihan Putt MPT [facility] [place] [phone] [phone]

## 2022-08-28 DIAGNOSIS — I10 Essential (primary) hypertension: Secondary | ICD-10-CM | POA: Diagnosis not present

## 2022-08-28 DIAGNOSIS — E1165 Type 2 diabetes mellitus with hyperglycemia: Secondary | ICD-10-CM | POA: Diagnosis not present

## 2022-08-28 DIAGNOSIS — E782 Mixed hyperlipidemia: Secondary | ICD-10-CM | POA: Diagnosis not present

## 2022-08-29 ENCOUNTER — Encounter (HOSPITAL_COMMUNITY): Payer: Self-pay | Admitting: Physical Therapy

## 2022-08-29 ENCOUNTER — Encounter (HOSPITAL_COMMUNITY): Payer: BC Managed Care – PPO | Admitting: Physical Therapy

## 2022-08-29 ENCOUNTER — Ambulatory Visit (HOSPITAL_COMMUNITY): Payer: BC Managed Care – PPO | Admitting: Physical Therapy

## 2022-08-29 DIAGNOSIS — R262 Difficulty in walking, not elsewhere classified: Secondary | ICD-10-CM | POA: Diagnosis not present

## 2022-08-29 DIAGNOSIS — R29898 Other symptoms and signs involving the musculoskeletal system: Secondary | ICD-10-CM

## 2022-08-29 DIAGNOSIS — M1712 Unilateral primary osteoarthritis, left knee: Secondary | ICD-10-CM

## 2022-08-29 DIAGNOSIS — M25562 Pain in left knee: Secondary | ICD-10-CM | POA: Diagnosis not present

## 2022-08-29 DIAGNOSIS — M6281 Muscle weakness (generalized): Secondary | ICD-10-CM

## 2022-08-29 NOTE — Therapy (Signed)
OUTPATIENT PHYSICAL THERAPY TREATMENT   Patient Name: Robin Sherman MRN: [id number] DOB:May 25, 1961, 62 y.o., female Today's Date: 08/29/2022  END OF SESSION:  PT End of Session - 08/29/22 1121     Visit Number 2    Number of Visits 8    Date for PT Re-Evaluation 09/19/22    Authorization Type [employer] P[employer]    Authorization Time Period 60 visit limit; no auth; no copay    Authorization - Visit Number 2    Authorization - Number of Visits 60    Progress Note Due on Visit 8    PT Start Time 1121    PT Stop Time 1159    PT Time Calculation (min) 38 min    Activity Tolerance Patient tolerated treatment well    Behavior During Therapy WFL for tasks assessed/performed             Past Medical History:  Diagnosis Date   Allergy    pollen   Arthritis    left knee   Asthma    Eczema    Followed by Dr. Nevada Crane dermatology   Encounter for screening colonoscopy 02/09/2020   Headache(784.0) 09/25/2012   Hypertension    Neuromuscular disorder ([place])    Pre-diabetes    sciatica right side 10/13/2013   TRIGGER FINGER 11/03/2008   Qualifier: Diagnosis of  By: Aline Brochure MD, Dorothyann Peng     Past Surgical History:  Procedure Laterality Date   ABDOMINAL HYSTERECTOMY     bleeding   BILATERAL SALPINGECTOMY Bilateral 08/01/2013   Procedure: BILATERAL SALPINGECTOMY;  Surgeon: Jonnie Kind, MD;  Location: [facility];  Service: Gynecology;  Laterality: Bilateral;   CHONDROPLASTY Left 01/14/2014   Procedure: CHONDROPLASTY OF FEMUR;  Surgeon: Carole Civil, MD;  Location: [facility];  Service: Orthopedics;  Laterality: Left;   CHONDROPLASTY Right 12/12/2018   Procedure: CHONDROPLASTY;  Surgeon: Carole Civil, MD;  Location: [facility];  Service: Orthopedics;  Laterality: Right;   COLONOSCOPY N/A 01/27/2013   Dr. Oneida Alar: Right colon with poor prep (patient ate [place] necks the night before the procedure).  Moderate diverticulosis in the sigmoid colon, moderate hemorrhoids.   COLONOSCOPY WITH  PROPOFOL N/A 04/06/2020   Procedure: COLONOSCOPY WITH PROPOFOL;  Surgeon: Eloise Harman, DO;  [facility];  Service: Endoscopy;  Laterality: N/A;  8:15am   HEMATOMA EVACUATION N/A 08/03/2013   Procedure: EVACUATION PELVIC HEMATOMA;  Surgeon: Jonnie Kind, MD;  Location: [facility];  Service: Gynecology;  Laterality: N/A;   KNEE ARTHROSCOPY WITH MEDIAL MENISECTOMY Left 01/14/2014   Procedure: KNEE ARTHROSCOPY WITH PARTIAL MEDIAL MENISECTOMY;  Surgeon: Carole Civil, MD;  Location: [facility];  Service: Orthopedics;  Laterality: Left;   KNEE ARTHROSCOPY WITH MEDIAL MENISECTOMY Right 12/12/2018   Procedure: KNEE ARTHROSCOPY WITH MEDIAL MENISCECTOMY;  Surgeon: Carole Civil, MD;  Location: [facility];  Service: Orthopedics;  Laterality: Right;   POLYPECTOMY  04/06/2020   Procedure: POLYPECTOMY;  Surgeon: Eloise Harman, DO;  [facility];  Service: Endoscopy;;   SUPRACERVICAL ABDOMINAL HYSTERECTOMY N/A 08/01/2013   Procedure: HYSTERECTOMY SUPRACERVICAL ABDOMINAL;  Surgeon: Jonnie Kind, MD;  Location: [facility];  Service: Gynecology;  Laterality: N/A;   TOTAL KNEE ARTHROPLASTY Right 03/29/2021   Procedure: TOTAL KNEE ARTHROPLASTY;  Surgeon: Carole Civil, MD;  Location: [facility];  Service: Orthopedics;  Laterality: Right;   TOTAL KNEE ARTHROPLASTY Left 08/01/2022   Procedure: TOTAL KNEE ARTHROPLASTY;  Surgeon: Carole Civil, MD;  Location: [facility];  Service: Orthopedics;  Laterality: Left;   TRANSFORAMINAL LUMBAR INTERBODY FUSION W/ MIS 1 LEVEL Right 06/14/2020   Procedure: Right Lumbar Five- Sacral One Minimally invasive transforaminal lumbar interbody fusion;  Surgeon: Judith Part, MD;  Location: [place];  Service: Neurosurgery;  Laterality: Right;  Right Lumbar Five- Sacral One Minimally invasive transforaminal lumbar interbody fusion   [place]   Patient Active Problem List   Diagnosis Date Noted   Osteoarthritis of left knee  08/01/2022   Left knee pain 06/14/2022   S/P TKR (total knee replacement), right 03/29/2021   Isthmic spondylolisthesis 06/14/2020   Polypharmacy 02/09/2020   Encounter for screening colonoscopy 02/09/2020   DDD (degenerative disc disease), lumbar 12/08/2019   S/P right knee arthroscopy 12/12/18 *with chondroplasty patella  12/19/2018   Borderline diabetes 07/02/2015   Vitamin D deficiency 07/02/2015   Lumbar back pain 07/02/2015   Insomnia 10/29/2013   Arthritis of knee, degenerative 10/13/2013   Hand dermatitis 03/02/2012   Essential hypertension, benign 09/21/2011   Class 2 obesity 09/21/2011   Shoulder pain, right 09/21/2011   Asthma, intermittent 09/21/2011   Tobacco user 09/21/2011    PCP: Celene Squibb, MD  REFERRING PROVIDER: Carole Civil, [place]  REFERRING DIAG: [phone] (ICD-10-CM) - Unilateral primary osteoarthritis, left knee  THERAPY DIAG:  Primary osteoarthritis of left knee  Difficulty in walking, not elsewhere classified  Acute pain of left knee  Muscle weakness (generalized)  Other symptoms and signs involving the musculoskeletal system  Rationale for Evaluation and Treatment: Rehabilitation  ONSET DATE: 08/01/22  SUBJECTIVE:   SUBJECTIVE STATEMENT: Patient states her knee is real sore today. Took pain meds today but knee still hurting.   PERTINENT HISTORY: Right knee 2022 TKA Lumbar fusion 2021  PAIN:  Are you having pain? Yes: NPRS scale: 9/10 Pain location: knee and lower leg Pain description: sore, aching and tight Aggravating factors: walking Relieving factors: medication  PRECAUTIONS: None  WEIGHT BEARING RESTRICTIONS: No  FALLS:  Has patient fallen in last 6 months? Yes. Number of falls 1  LIVING ENVIRONMENT: Lives with: lives with their family and lives with their spouse Lives in: House/apartment Stairs: Yes: External: 2 steps; on right going up, on left going up, and bilateral but cannot reach  both Has following equipment at home: Single point cane and Walker - 2 wheeled  OCCUPATION: CNA  PLOF: Independent  PATIENT GOALS: to be able to walk  NEXT MD VISIT: 09/07/2022  OBJECTIVE:   DIAGNOSTIC FINDINGS: IMPRESSION: Status post tricompartmental knee replacement without immediate complicating feature.  CLINICAL DATA:  Left lower extremity pain and edema. History of left knee replacement. Evaluate for acute or chronic DVT.   EXAM: LEFT LOWER EXTREMITY VENOUS DOPPLER ULTRASOUND   TECHNIQUE: Gray-scale sonography with graded compression, as well as color Doppler and duplex ultrasound were performed to evaluate the lower extremity deep venous systems from the level of the common femoral vein and including the common femoral, femoral, profunda femoral, popliteal and calf veins including the posterior tibial, peroneal and gastrocnemius veins when visible. The superficial great saphenous vein was also interrogated. Spectral Doppler was utilized to evaluate flow at rest and with distal augmentation maneuvers in the common femoral, femoral and popliteal veins.   COMPARISON:  Left lower extremity venous Doppler ultrasound-10/01/2013 (negative).   FINDINGS: Contralateral Common Femoral Vein: Respiratory phasicity is normal and symmetric with the symptomatic side. No evidence of thrombus. Normal compressibility.   Common  Femoral Vein: No evidence of thrombus. Normal compressibility, respiratory phasicity and response to augmentation.   Saphenofemoral Junction: No evidence of thrombus. Normal compressibility and flow on color Doppler imaging.   Profunda Femoral Vein: No evidence of thrombus. Normal compressibility and flow on color Doppler imaging.   Femoral Vein: No evidence of thrombus. Normal compressibility, respiratory phasicity and response to augmentation.   Popliteal Vein: No evidence of thrombus. Normal compressibility, respiratory phasicity and response to  augmentation.   Calf Veins: No evidence of thrombus. Normal compressibility and flow on color Doppler imaging.   Superficial Great Saphenous Vein: No evidence of thrombus. Normal compressibility.   Other Findings:  None.   IMPRESSION: No evidence of acute or chronic DVT within the left lower extremity.     Electronically Signed   By: Sandi Mariscal M.D.   On: 08/08/2022 10:24      PATIENT SURVEYS:  FOTO 53  COGNITION: Overall cognitive status: Within functional limits for tasks assessed     SENSATION: Some into left leg and foot but is normalizing  EDEMA:  Yes normal for this time s/p    PALPATION: General soreness only  LOWER EXTREMITY ROM:  Active ROM Right eval Left eval Left 08/29/22  Hip flexion     Hip extension     Hip abduction     Hip adduction     Hip internal rotation     Hip external rotation     Knee flexion  91 104 to 111 following heel slides  Knee extension  -10 Lacking 8  Ankle dorsiflexion     Ankle plantarflexion     Ankle inversion     Ankle eversion      (Blank rows = not tested)  LOWER EXTREMITY MMT:  MMT Right eval Left eval  Hip flexion    Hip extension    Hip abduction    Hip adduction    Hip internal rotation    Hip external rotation    Knee flexion  Sitting 4-  Knee extension  4  Ankle dorsiflexion  4+  Ankle plantarflexion    Ankle inversion    Ankle eversion     (Blank rows = not tested)   FUNCTIONAL TESTS:  5 times sit to stand: 20.42 sec 2 minute walk test: with SPC 280 ft  GAIT: Distance walked: 280 ft Assistive device utilized: Single point cane Level of assistance: Modified independence Comments: slow gait speed; decreased gait speed   TODAY'S TREATMENT:                                                                                                                              DATE:  08/29/22 Recumbent bike  5 minutes rocking for dynamic warm up and conditioning.  Heel slides 10 x10 second holds  with strap SLR 2 x 10  Sidelying hip abduction 1 x 10 LLE Step up 4 inch 2 x 10    08/22/22 physical  therapy evaluation and HEP instruction    PATIENT EDUCATION:  Education details:08/29/22: HEP; EVAL: Patient educated on exam findings, POC, scope of PT, HEP, and what to expect next week. Person educated: Patient Education method: Explanation, Demonstration, and Handouts Education comprehension: verbalized understanding, returned demonstration, verbal cues required, and tactile cues required   HOME EXERCISE PROGRAM: Access Code: [email or web address] URL: [email or web address]   Date: 08/22/2022 - Supine Ankle Pumps  - 2 x daily - 7 x weekly - 1 sets - 15 reps - Supine Quadricep Sets  - 2 x daily - 7 x weekly - 1 sets - 15 reps - Supine Active Straight Leg Raise  - 2 x daily - 7 x weekly - 1 sets - 15 reps - Supine Short Arc Quad  - 2 x daily - 7 x weekly - 1 sets - 15 reps - Supine Heel Slides  - 2 x daily - 7 x weekly - 1 sets - 15 reps - Seated Long Arc Quad  - 2 x daily - 7 x weekly - 1 sets - 10 reps - 2 sec hold - Seated Knee Flexion Stretch  - 1 x daily - 7 x weekly - 1 sets - 15 reps - Heel Raises with Counter Support  - 1 x daily - 7 x weekly - 1 sets - 10 reps  ASSESSMENT:  CLINICAL IMPRESSION: Began session on bike for dynamic warm up and ROM. Demonstrating improving ROM lacking 8 to 104 following bike and improves to 111 following heel slides. Slight extensor lag with SLR. Patient with c/o continued edema and ted hose sliding down, took measurements and gave handout for elastic therapy for compression garments. Requires unilateral UE support to improve step mechanics.  Patient will continue to benefit from physical therapy in order to improve function and reduce impairment.    OBJECTIVE IMPAIRMENTS: Abnormal gait, decreased activity tolerance, decreased endurance, decreased mobility, difficulty walking, decreased ROM, decreased strength, hypomobility, increased edema,  increased fascial restrictions, impaired perceived functional ability, increased muscle spasms, impaired flexibility, and pain.   ACTIVITY LIMITATIONS: carrying, lifting, bending, sitting, standing, squatting, sleeping, stairs, transfers, bathing, toileting, locomotion level, and caring for others  PARTICIPATION LIMITATIONS: meal prep, cleaning, laundry, driving, shopping, community activity, occupation, and yard work   [employer] POTENTIAL: Good  CLINICAL DECISION MAKING: Stable/uncomplicated  EVALUATION COMPLEXITY: Low   GOALS: Goals reviewed with patient? No  SHORT TERM GOALS: Target date: 09/05/2022 patient will be independent with initial HEP  Baseline: Goal status: INITIAL  2.  Patient will improve left knee extension to -5 (lacking) to normalize gait pattern/ stance phase with household ambulation Baseline:  Goal status: INITIAL  LONG TERM GOALS: Target date: 09/19/2022  Patient will be independent in self management strategies to improve quality of life and functional outcomes.   Baseline:  Goal status: INITIAL  2.  Patient will increase  leg MMTs to 5/5 without pain to promote return to ambulation community distances with minimal deviation.    Baseline:  Goal status: INITIAL  3.  Patient will increased knee mobility to -2 to 120 to promote normal navigation of steps; step over step pattern Baseline:  Goal status: INITIAL  4.  Patient will improve 5 times sit to stand score from 20.42 sec to 15 sec  or less to demonstrate improved functional mobility and increased lower extremity strength.  Baseline:  Goal status: INITIAL  5.  Patient will increase 2 MWT to 350 ft to demonstrate improved functional mobility with community ambulation Baseline:  Goal status: INITIAL  PLAN:  PT FREQUENCY: 2x/week  PT DURATION: 4 weeks  PLANNED INTERVENTIONS: Therapeutic exercises, Therapeutic activity, Neuromuscular re-education, Balance training, Gait training,  Patient/Family education, Joint manipulation, Joint mobilization, Stair training, Orthotic/Fit training, DME instructions, Aquatic Therapy, Dry Needling, Electrical stimulation, Spinal manipulation, Spinal mobilization, Cryotherapy, Moist heat, Compression bandaging, scar mobilization, Splintting, Taping, Traction, Ultrasound, Ionotophoresis 24m/ml Dexamethasone, and Manual therapy   PLAN FOR NEXT SESSION: progress knee mobility and strength, gait training, balance, transfers   11:21 AM, 08/29/22 AMearl LatinPT, DPT [facility]

## 2022-08-31 DIAGNOSIS — E1165 Type 2 diabetes mellitus with hyperglycemia: Secondary | ICD-10-CM | POA: Diagnosis not present

## 2022-08-31 DIAGNOSIS — Z96659 Presence of unspecified artificial knee joint: Secondary | ICD-10-CM | POA: Diagnosis not present

## 2022-08-31 DIAGNOSIS — E669 Obesity, unspecified: Secondary | ICD-10-CM | POA: Diagnosis not present

## 2022-08-31 DIAGNOSIS — I10 Essential (primary) hypertension: Secondary | ICD-10-CM | POA: Diagnosis not present

## 2022-08-31 DIAGNOSIS — Z96652 Presence of left artificial knee joint: Secondary | ICD-10-CM | POA: Diagnosis not present

## 2022-09-04 ENCOUNTER — Other Ambulatory Visit: Payer: Self-pay

## 2022-09-04 ENCOUNTER — Other Ambulatory Visit: Payer: Self-pay | Admitting: Orthopedic Surgery

## 2022-09-04 MED ORDER — OXYCODONE-ACETAMINOPHEN 5-325 MG PO TABS: 1.0000 | ORAL_TABLET | Freq: Four times a day (QID) | ORAL | 0 refills | Status: DC | PRN

## 2022-09-04 NOTE — Telephone Encounter (Signed)
Oxycodone-Acetaminophen 5/325 MG  Qty 20 Tablets  Take 1 tablet by mouth every 6 (six) hours as needed for severe pain.   PATIENT USES [facility]

## 2022-09-05 ENCOUNTER — Ambulatory Visit (HOSPITAL_COMMUNITY): Payer: BC Managed Care – PPO

## 2022-09-05 DIAGNOSIS — M25562 Pain in left knee: Secondary | ICD-10-CM | POA: Diagnosis not present

## 2022-09-05 DIAGNOSIS — R262 Difficulty in walking, not elsewhere classified: Secondary | ICD-10-CM | POA: Diagnosis not present

## 2022-09-05 DIAGNOSIS — M1712 Unilateral primary osteoarthritis, left knee: Secondary | ICD-10-CM

## 2022-09-05 DIAGNOSIS — R29898 Other symptoms and signs involving the musculoskeletal system: Secondary | ICD-10-CM | POA: Diagnosis not present

## 2022-09-05 DIAGNOSIS — M6281 Muscle weakness (generalized): Secondary | ICD-10-CM | POA: Diagnosis not present

## 2022-09-05 NOTE — Therapy (Signed)
OUTPATIENT PHYSICAL THERAPY TREATMENT   Patient Name: Robin Sherman MRN: [id number] DOB:09-Dec-1960, 62 y.o., female Today's Date: 09/05/2022  END OF SESSION:  PT End of Session - 09/05/22 1124     Visit Number 3    Number of Visits 8    Date for PT Re-Evaluation 09/19/22    Authorization Type [employer] P[employer]    Authorization Time Period 60 visit limit; no auth; no copay    Authorization - Visit Number 3    Authorization - Number of Visits 60    Progress Note Due on Visit 8    PT Start Time 1121    PT Stop Time 1200    PT Time Calculation (min) 39 min    Activity Tolerance Patient tolerated treatment well    Behavior During Therapy WFL for tasks assessed/performed             Past Medical History:  Diagnosis Date   Allergy    pollen   Arthritis    left knee   Asthma    Eczema    Followed by Dr. Nevada Crane dermatology   Encounter for screening colonoscopy 02/09/2020   Headache(784.0) 09/25/2012   Hypertension    Neuromuscular disorder ([place])    Pre-diabetes    sciatica right side 10/13/2013   TRIGGER FINGER 11/03/2008   Qualifier: Diagnosis of  By: Aline Brochure MD, Dorothyann Peng     Past Surgical History:  Procedure Laterality Date   ABDOMINAL HYSTERECTOMY     bleeding   BILATERAL SALPINGECTOMY Bilateral 08/01/2013   Procedure: BILATERAL SALPINGECTOMY;  Surgeon: Jonnie Kind, MD;  Location: [facility];  Service: Gynecology;  Laterality: Bilateral;   CHONDROPLASTY Left 01/14/2014   Procedure: CHONDROPLASTY OF FEMUR;  Surgeon: Carole Civil, MD;  Location: [facility];  Service: Orthopedics;  Laterality: Left;   CHONDROPLASTY Right 12/12/2018   Procedure: CHONDROPLASTY;  Surgeon: Carole Civil, MD;  Location: [facility];  Service: Orthopedics;  Laterality: Right;   COLONOSCOPY N/A 01/27/2013   Dr. Oneida Alar: Right colon with poor prep (patient ate [place] necks the night before the procedure).  Moderate diverticulosis in the sigmoid colon, moderate hemorrhoids.   COLONOSCOPY WITH  PROPOFOL N/A 04/06/2020   Procedure: COLONOSCOPY WITH PROPOFOL;  Surgeon: Eloise Harman, DO;  [facility];  Service: Endoscopy;  Laterality: N/A;  8:15am   HEMATOMA EVACUATION N/A 08/03/2013   Procedure: EVACUATION PELVIC HEMATOMA;  Surgeon: Jonnie Kind, MD;  Location: [facility];  Service: Gynecology;  Laterality: N/A;   KNEE ARTHROSCOPY WITH MEDIAL MENISECTOMY Left 01/14/2014   Procedure: KNEE ARTHROSCOPY WITH PARTIAL MEDIAL MENISECTOMY;  Surgeon: Carole Civil, MD;  Location: [facility];  Service: Orthopedics;  Laterality: Left;   KNEE ARTHROSCOPY WITH MEDIAL MENISECTOMY Right 12/12/2018   Procedure: KNEE ARTHROSCOPY WITH MEDIAL MENISCECTOMY;  Surgeon: Carole Civil, MD;  Location: [facility];  Service: Orthopedics;  Laterality: Right;   POLYPECTOMY  04/06/2020   Procedure: POLYPECTOMY;  Surgeon: Eloise Harman, DO;  [facility];  Service: Endoscopy;;   SUPRACERVICAL ABDOMINAL HYSTERECTOMY N/A 08/01/2013   Procedure: HYSTERECTOMY SUPRACERVICAL ABDOMINAL;  Surgeon: Jonnie Kind, MD;  Location: [facility];  Service: Gynecology;  Laterality: N/A;   TOTAL KNEE ARTHROPLASTY Right 03/29/2021   Procedure: TOTAL KNEE ARTHROPLASTY;  Surgeon: Carole Civil, MD;  Location: [facility];  Service: Orthopedics;  Laterality: Right;   TOTAL KNEE ARTHROPLASTY Left 08/01/2022   Procedure: TOTAL KNEE ARTHROPLASTY;  Surgeon: Carole Civil, MD;  Location: [facility];  Service: Orthopedics;  Laterality: Left;   TRANSFORAMINAL LUMBAR INTERBODY FUSION W/ MIS 1 LEVEL Right 06/14/2020   Procedure: Right Lumbar Five- Sacral One Minimally invasive transforaminal lumbar interbody fusion;  Surgeon: Judith Part, MD;  Location: [facility];  Service: Neurosurgery;  Laterality: Right;  Right Lumbar Five- Sacral One Minimally invasive transforaminal lumbar interbody fusion   [place]   Patient Active Problem List   Diagnosis Date Noted   Osteoarthritis of left knee  08/01/2022   Left knee pain 06/14/2022   S/P TKR (total knee replacement), right 03/29/2021   Isthmic spondylolisthesis 06/14/2020   Polypharmacy 02/09/2020   Encounter for screening colonoscopy 02/09/2020   DDD (degenerative disc disease), lumbar 12/08/2019   S/P right knee arthroscopy 12/12/18 *with chondroplasty patella  12/19/2018   Borderline diabetes 07/02/2015   Vitamin D deficiency 07/02/2015   Lumbar back pain 07/02/2015   Insomnia 10/29/2013   Arthritis of knee, degenerative 10/13/2013   Hand dermatitis 03/02/2012   Essential hypertension, benign 09/21/2011   Class 2 obesity 09/21/2011   Shoulder pain, right 09/21/2011   Asthma, intermittent 09/21/2011   Tobacco user 09/21/2011    PCP: Celene Squibb, MD  REFERRING PROVIDER: Carole Civil, [place]  REFERRING DIAG: [phone] (ICD-10-CM) - Unilateral primary osteoarthritis, left knee  THERAPY DIAG:  Primary osteoarthritis of left knee  Difficulty in walking, not elsewhere classified  Rationale for Evaluation and Treatment: Rehabilitation  ONSET DATE: 08/01/22  SUBJECTIVE:   SUBJECTIVE STATEMENT: Patient states today is her "worst day yet"; states she hurts all over today; walking without AD; reports she had some numbness left leg from knee all the way down and sore down to the ankle.  Pain woke her up at 3 am this morning.  PERTINENT HISTORY: Right knee 2022 TKA Lumbar fusion 2021  PAIN:  Are you having pain? Yes: NPRS scale: 9/10 Pain location: knee and lower leg Pain description: sore, aching and tight Aggravating factors: walking Relieving factors: medication  PRECAUTIONS: None  WEIGHT BEARING RESTRICTIONS: No  FALLS:  Has patient fallen in last 6 months? Yes. Number of falls 1  LIVING ENVIRONMENT: Lives with: lives with their family and lives with their spouse Lives in: House/apartment Stairs: Yes: External: 2 steps; on right going up, on left going up, and bilateral  but cannot reach both Has following equipment at home: Single point cane and Walker - 2 wheeled  OCCUPATION: CNA  PLOF: Independent  PATIENT GOALS: to be able to walk  NEXT MD VISIT: 09/07/2022  OBJECTIVE:   DIAGNOSTIC FINDINGS: IMPRESSION: Status post tricompartmental knee replacement without immediate complicating feature.  CLINICAL DATA:  Left lower extremity pain and edema. History of left knee replacement. Evaluate for acute or chronic DVT.   EXAM: LEFT LOWER EXTREMITY VENOUS DOPPLER ULTRASOUND   TECHNIQUE: Gray-scale sonography with graded compression, as well as color Doppler and duplex ultrasound were performed to evaluate the lower extremity deep venous systems from the level of the common femoral vein and including the common femoral, femoral, profunda femoral, popliteal and calf veins including the posterior tibial, peroneal and gastrocnemius veins when visible. The superficial great saphenous vein was also interrogated. Spectral Doppler was utilized to evaluate flow at rest and with distal augmentation maneuvers in the common femoral, femoral and popliteal veins.   COMPARISON:  Left lower extremity venous Doppler ultrasound-10/01/2013 (negative).   FINDINGS: Contralateral Common Femoral Vein: Respiratory phasicity is normal and symmetric with the symptomatic  side. No evidence of thrombus. Normal compressibility.   Common Femoral Vein: No evidence of thrombus. Normal compressibility, respiratory phasicity and response to augmentation.   Saphenofemoral Junction: No evidence of thrombus. Normal compressibility and flow on color Doppler imaging.   Profunda Femoral Vein: No evidence of thrombus. Normal compressibility and flow on color Doppler imaging.   Femoral Vein: No evidence of thrombus. Normal compressibility, respiratory phasicity and response to augmentation.   Popliteal Vein: No evidence of thrombus. Normal compressibility, respiratory phasicity  and response to augmentation.   Calf Veins: No evidence of thrombus. Normal compressibility and flow on color Doppler imaging.   Superficial Great Saphenous Vein: No evidence of thrombus. Normal compressibility.   Other Findings:  None.   IMPRESSION: No evidence of acute or chronic DVT within the left lower extremity.     Electronically Signed   By: Sandi Mariscal M.D.   On: 08/08/2022 10:24      PATIENT SURVEYS:  FOTO 53  COGNITION: Overall cognitive status: Within functional limits for tasks assessed     SENSATION: Some into left leg and foot but is normalizing  EDEMA:  Yes normal for this time s/p    PALPATION: General soreness only  LOWER EXTREMITY ROM:  Active ROM Right eval Left eval Left 08/29/22  Hip flexion     Hip extension     Hip abduction     Hip adduction     Hip internal rotation     Hip external rotation     Knee flexion  91 104 to 111 following heel slides  Knee extension  -10 Lacking 8  Ankle dorsiflexion     Ankle plantarflexion     Ankle inversion     Ankle eversion      (Blank rows = not tested)  LOWER EXTREMITY MMT:  MMT Right eval Left eval  Hip flexion    Hip extension    Hip abduction    Hip adduction    Hip internal rotation    Hip external rotation    Knee flexion  Sitting 4-  Knee extension  4  Ankle dorsiflexion  4+  Ankle plantarflexion    Ankle inversion    Ankle eversion     (Blank rows = not tested)   FUNCTIONAL TESTS:  5 times sit to stand: 20.42 sec 2 minute walk test: with SPC 280 ft  GAIT: Distance walked: 280 ft Assistive device utilized: Single point cane Level of assistance: Modified independence Comments: slow gait speed; decreased gait speed   TODAY'S TREATMENT:                                                                                                                              DATE:  09/05/22 Recumbent bike seat 13 x 3' dynamic warm up some full revolutions  Standing: Heel/toe  raises 2 x 10 Slant board 5 x 10" 8" box left knee drives for flexion x 2' Hamstring stretch  10" hold x 10 4" step 2 x 10 4" lateral step ups 2 x 10 Hip abduction 2 x 10 each Mini squats x 10  Seated: LAQs 2 x 10 with 2" hold   08/29/22 Recumbent bike  5 minutes rocking for dynamic warm up and conditioning.  Heel slides 10 x10 second holds with strap SLR 2 x 10  Sidelying hip abduction 1 x 10 LLE Step up 4 inch 2 x 10    08/22/22 physical therapy evaluation and HEP instruction    PATIENT EDUCATION:  Education details:08/29/22: HEP; EVAL: Patient educated on exam findings, POC, scope of PT, HEP, and what to expect next week. Person educated: Patient Education method: Explanation, Demonstration, and Handouts Education comprehension: verbalized understanding, returned demonstration, verbal cues required, and tactile cues required   HOME EXERCISE PROGRAM: Access Code: [email or web address] URL: [email or web address]   Date: 08/22/2022 - Supine Ankle Pumps  - 2 x daily - 7 x weekly - 1 sets - 15 reps - Supine Quadricep Sets  - 2 x daily - 7 x weekly - 1 sets - 15 reps - Supine Active Straight Leg Raise  - 2 x daily - 7 x weekly - 1 sets - 15 reps - Supine Short Arc Quad  - 2 x daily - 7 x weekly - 1 sets - 15 reps - Supine Heel Slides  - 2 x daily - 7 x weekly - 1 sets - 15 reps - Seated Long Arc Quad  - 2 x daily - 7 x weekly - 1 sets - 10 reps - 2 sec hold - Seated Knee Flexion Stretch  - 1 x daily - 7 x weekly - 1 sets - 15 reps - Heel Raises with Counter Support  - 1 x daily - 7 x weekly - 1 sets - 10 reps  ASSESSMENT:  CLINICAL IMPRESSION: Today's session continued to focus on lower extremity mobility and strength.  Able to make full revolution on bike today.  Complains of some numbness in her groin bilaterally today?  Needs cues to avoid trunk substitution with hip abduction.  Patient will continue to benefit from physical therapy in order to improve function and reduce  impairment.    OBJECTIVE IMPAIRMENTS: Abnormal gait, decreased activity tolerance, decreased endurance, decreased mobility, difficulty walking, decreased ROM, decreased strength, hypomobility, increased edema, increased fascial restrictions, impaired perceived functional ability, increased muscle spasms, impaired flexibility, and pain.   ACTIVITY LIMITATIONS: carrying, lifting, bending, sitting, standing, squatting, sleeping, stairs, transfers, bathing, toileting, locomotion level, and caring for others  PARTICIPATION LIMITATIONS: meal prep, cleaning, laundry, driving, shopping, community activity, occupation, and yard work   [employer] POTENTIAL: Good  CLINICAL DECISION MAKING: Stable/uncomplicated  EVALUATION COMPLEXITY: Low   GOALS: Goals reviewed with patient? No  SHORT TERM GOALS: Target date: 09/05/2022 patient will be independent with initial HEP  Baseline: Goal status: IN PROGRESS  2.  Patient will improve left knee extension to -5 (lacking) to normalize gait pattern/ stance phase with household ambulation Baseline:  Goal status: IN PROGRESS  LONG TERM GOALS: Target date: 09/19/2022  Patient will be independent in self management strategies to improve quality of life and functional outcomes.   Baseline:  Goal status: IN PROGRESS  2.  Patient will increase  leg MMTs to 5/5 without pain to promote return to ambulation community distances with minimal deviation.    Baseline:  Goal status: IN PROGRESS  3.  Patient will increased knee mobility to -2 to 120 to promote normal navigation of steps;  step over step pattern Baseline:  Goal status: IN PROGRESS  4.  Patient will improve 5 times sit to stand score from 20.42 sec to 15 sec  or less to demonstrate improved functional mobility and increased lower extremity strength.  Baseline:  Goal status: IN PROGRESS  5.  Patient will increase 2 MWT to 350 ft to demonstrate improved functional mobility with community  ambulation Baseline:  Goal status: IN PROGRESS  PLAN:  PT FREQUENCY: 2x/week  PT DURATION: 4 weeks  PLANNED INTERVENTIONS: Therapeutic exercises, Therapeutic activity, Neuromuscular re-education, Balance training, Gait training, Patient/Family education, Joint manipulation, Joint mobilization, Stair training, Orthotic/Fit training, DME instructions, Aquatic Therapy, Dry Needling, Electrical stimulation, Spinal manipulation, Spinal mobilization, Cryotherapy, Moist heat, Compression bandaging, scar mobilization, Splintting, Taping, Traction, Ultrasound, Ionotophoresis 23m/ml Dexamethasone, and Manual therapy   PLAN FOR NEXT SESSION: progress knee mobility and strength, gait training, balance, transfers; sees MD 2/22   12:01 PM, 09/05/22 Jacquelyne Quarry Small Cortlynn Hollinsworth MPT [facility] [place] #[phone]

## 2022-09-06 DIAGNOSIS — M5416 Radiculopathy, lumbar region: Secondary | ICD-10-CM | POA: Diagnosis not present

## 2022-09-06 DIAGNOSIS — Z6838 Body mass index (BMI) 38.0-38.9, adult: Secondary | ICD-10-CM | POA: Diagnosis not present

## 2022-09-06 DIAGNOSIS — M4310 Spondylolisthesis, site unspecified: Secondary | ICD-10-CM | POA: Diagnosis not present

## 2022-09-07 ENCOUNTER — Ambulatory Visit (INDEPENDENT_AMBULATORY_CARE_PROVIDER_SITE_OTHER): Payer: BC Managed Care – PPO | Admitting: Orthopedic Surgery

## 2022-09-07 DIAGNOSIS — M5432 Sciatica, left side: Secondary | ICD-10-CM

## 2022-09-07 DIAGNOSIS — M5440 Lumbago with sciatica, unspecified side: Secondary | ICD-10-CM

## 2022-09-07 DIAGNOSIS — Z96652 Presence of left artificial knee joint: Secondary | ICD-10-CM

## 2022-09-07 MED ORDER — PREDNISONE 10 MG (48) PO TBPK: ORAL_TABLET | Freq: Every day | ORAL | 0 refills | Status: DC

## 2022-09-07 NOTE — Progress Notes (Signed)
Postop visit #2  Encounter Diagnoses  Name Primary?   S/P TKR (total knee replacement), left 08/01/22 Yes   Sciatica, left side    Low back pain with sciatica, sciatica laterality unspecified, unspecified back pain laterality, unspecified chronicity    5 weeks post left total knee  Robin Sherman has progressed well with her total knee she has 0 to 115 degrees of motion in the knee she does have some numbness on the lateral side of the incision which is normal and will resolve for the most part  But she is also having some issues with her chronic lower back pain.  She is being followed by neurosurgery with further workup planned after imaging was done yesterday  Follow-up in 6 weeks for the left total knee  She will take some ibuprofen at night after she finishes her second prednisone Dosepak, she is already on gabapentin for the back and leg pain and radiculopathy

## 2022-09-08 ENCOUNTER — Encounter (HOSPITAL_COMMUNITY): Payer: BC Managed Care – PPO

## 2022-09-10 IMAGING — DX DG KNEE 1-2V PORT*R*
2 series · 2 of 2 positions shown · non-contrast
Comparison: None.

CLINICAL DATA: Post total knee replacement

EXAM:
PORTABLE RIGHT KNEE - 1-2 VIEW

[knee ap]
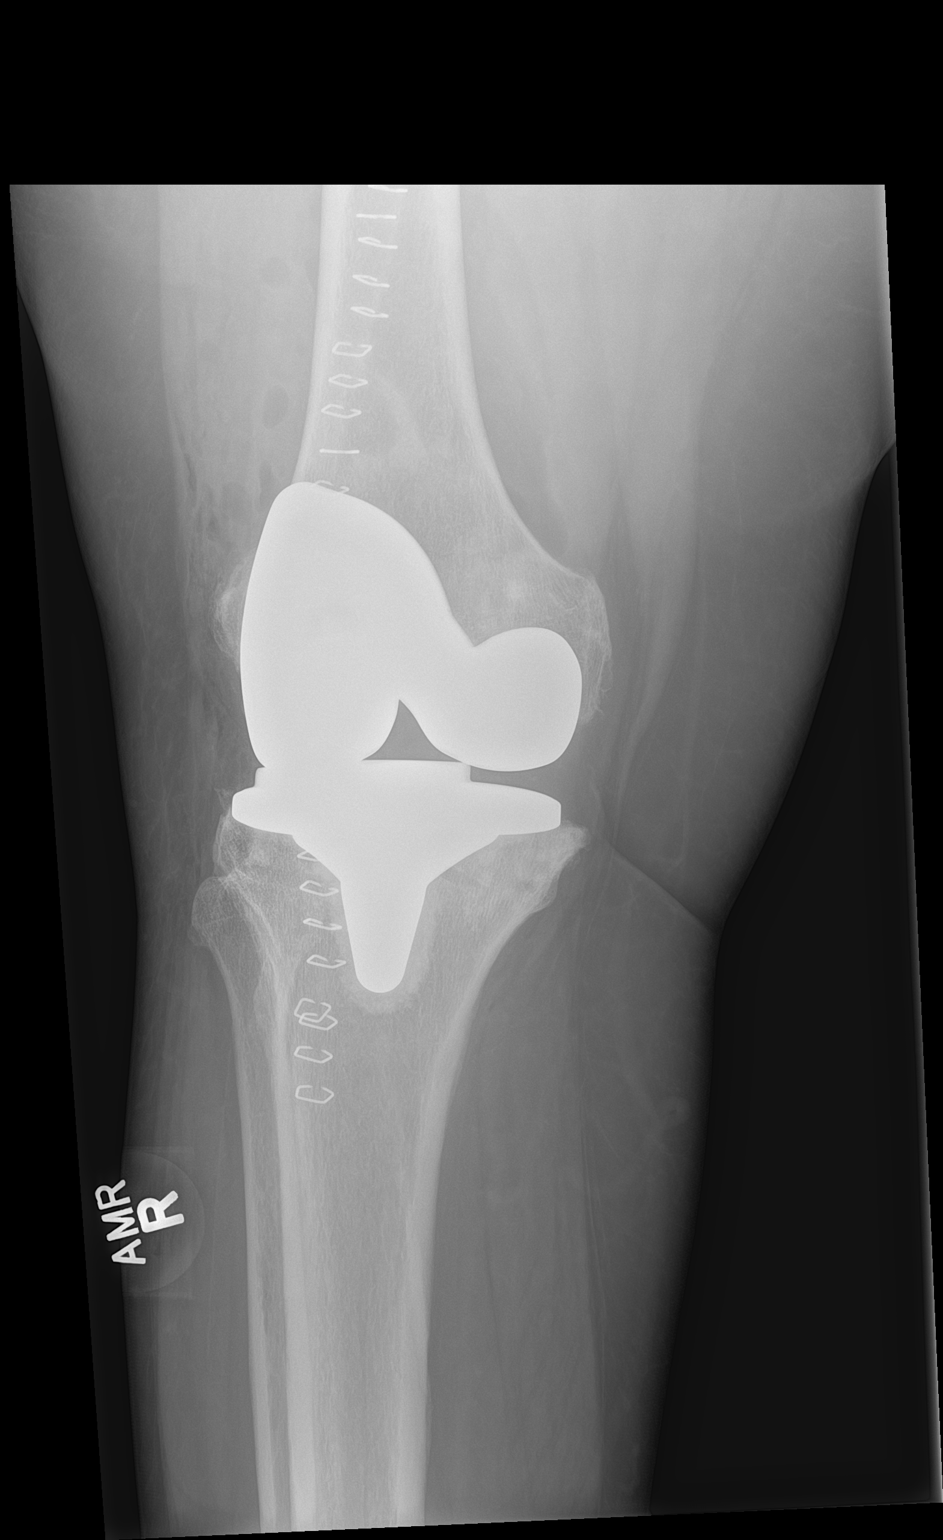

[knee lat]
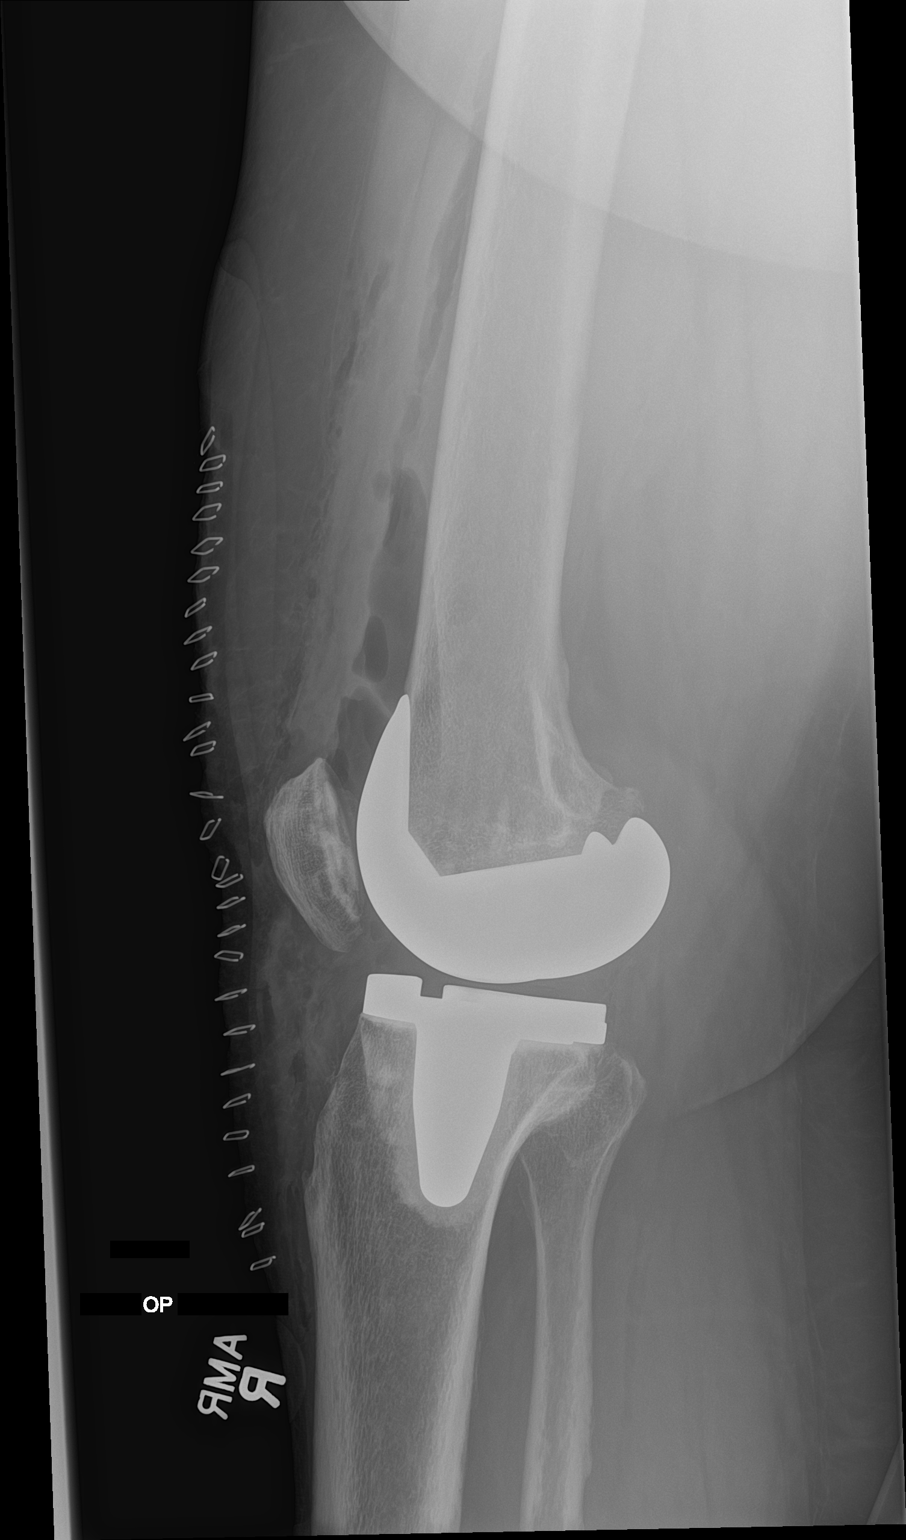

[2 of 2 positions shown; findings below may reference images not displayed]

FINDINGS: Changes of right knee replacement. No hardware bony complicating
feature. Soft tissue and joint space gas noted.
IMPRESSION: Right knee replacement.  No visible complicating feature.

## 2022-09-12 ENCOUNTER — Encounter (HOSPITAL_COMMUNITY): Payer: BC Managed Care – PPO

## 2022-09-13 ENCOUNTER — Encounter (HOSPITAL_COMMUNITY): Payer: BC Managed Care – PPO

## 2022-09-13 ENCOUNTER — Other Ambulatory Visit: Payer: Self-pay

## 2022-09-13 ENCOUNTER — Other Ambulatory Visit: Payer: Self-pay | Admitting: Orthopedic Surgery

## 2022-09-13 DIAGNOSIS — M5440 Lumbago with sciatica, unspecified side: Secondary | ICD-10-CM

## 2022-09-13 DIAGNOSIS — Z96652 Presence of left artificial knee joint: Secondary | ICD-10-CM

## 2022-09-13 MED ORDER — HYDROCODONE-ACETAMINOPHEN 10-325 MG PO TABS: 1.0000 | ORAL_TABLET | ORAL | 0 refills | Status: AC | PRN

## 2022-09-13 NOTE — Progress Notes (Signed)
Meds ordered this encounter  Medications   HYDROcodone-acetaminophen (NORCO) 10-325 MG tablet    Sig: Take 1 tablet by mouth every 4 (four) hours as needed for up to 5 days.    Dispense:  30 tablet    Refill:  0    Change from oxycodone per post op protocol

## 2022-09-13 NOTE — Telephone Encounter (Signed)
Oxycodone-Acetaminophen 5/325 MG  Qty 20 Tablets   Take 1 tablet by mouth every 6 (six) hours as needed for severe pain.    PATIENT USES [facility]

## 2022-09-14 ENCOUNTER — Encounter (HOSPITAL_COMMUNITY): Payer: BC Managed Care – PPO

## 2022-09-14 ENCOUNTER — Encounter (HOSPITAL_COMMUNITY): Payer: BC Managed Care – PPO | Admitting: Physical Therapy

## 2022-09-14 ENCOUNTER — Encounter: Payer: Self-pay | Admitting: Radiology

## 2022-09-14 ENCOUNTER — Ambulatory Visit (HOSPITAL_COMMUNITY): Payer: BC Managed Care – PPO

## 2022-09-14 DIAGNOSIS — R29898 Other symptoms and signs involving the musculoskeletal system: Secondary | ICD-10-CM | POA: Diagnosis not present

## 2022-09-14 DIAGNOSIS — M1712 Unilateral primary osteoarthritis, left knee: Secondary | ICD-10-CM

## 2022-09-14 DIAGNOSIS — R262 Difficulty in walking, not elsewhere classified: Secondary | ICD-10-CM | POA: Diagnosis not present

## 2022-09-14 DIAGNOSIS — M6281 Muscle weakness (generalized): Secondary | ICD-10-CM | POA: Diagnosis not present

## 2022-09-14 DIAGNOSIS — M25562 Pain in left knee: Secondary | ICD-10-CM | POA: Diagnosis not present

## 2022-09-14 NOTE — Therapy (Signed)
OUTPATIENT PHYSICAL THERAPY TREATMENT   Patient Name: Robin Sherman MRN: [id number] DOB:Jan 31, 1961, 62 y.o., female Today's Date: 09/14/2022  END OF SESSION:  PT End of Session - 09/14/22 1119     Visit Number 4    Number of Visits 8    Date for PT Re-Evaluation 09/19/22    Authorization Type [employer] P[employer]    Authorization Time Period 60 visit limit; no auth; no copay    Authorization - Visit Number 4    Authorization - Number of Visits 60    Progress Note Due on Visit 8    PT Start Time 1120    PT Stop Time 1200    PT Time Calculation (min) 40 min    Activity Tolerance Patient tolerated treatment well    Behavior During Therapy WFL for tasks assessed/performed             Past Medical History:  Diagnosis Date   Allergy    pollen   Arthritis    left knee   Asthma    Eczema    Followed by Dr. Nevada Crane dermatology   Encounter for screening colonoscopy 02/09/2020   Headache(784.0) 09/25/2012   Hypertension    Neuromuscular disorder ([place])    Pre-diabetes    sciatica right side 10/13/2013   TRIGGER FINGER 11/03/2008   Qualifier: Diagnosis of  By: Aline Brochure MD, Dorothyann Peng     Past Surgical History:  Procedure Laterality Date   ABDOMINAL HYSTERECTOMY     bleeding   BILATERAL SALPINGECTOMY Bilateral 08/01/2013   Procedure: BILATERAL SALPINGECTOMY;  Surgeon: Jonnie Kind, MD;  Location: [facility];  Service: Gynecology;  Laterality: Bilateral;   CHONDROPLASTY Left 01/14/2014   Procedure: CHONDROPLASTY OF FEMUR;  Surgeon: Carole Civil, MD;  Location: [facility];  Service: Orthopedics;  Laterality: Left;   CHONDROPLASTY Right 12/12/2018   Procedure: CHONDROPLASTY;  Surgeon: Carole Civil, MD;  Location: [facility];  Service: Orthopedics;  Laterality: Right;   COLONOSCOPY N/A 01/27/2013   Dr. Oneida Alar: Right colon with poor prep (patient ate [place] necks the night before the procedure).  Moderate diverticulosis in the sigmoid colon, moderate hemorrhoids.   COLONOSCOPY WITH  PROPOFOL N/A 04/06/2020   Procedure: COLONOSCOPY WITH PROPOFOL;  Surgeon: Eloise Harman, DO;  [facility];  Service: Endoscopy;  Laterality: N/A;  8:15am   HEMATOMA EVACUATION N/A 08/03/2013   Procedure: EVACUATION PELVIC HEMATOMA;  Surgeon: Jonnie Kind, MD;  Location: [facility];  Service: Gynecology;  Laterality: N/A;   KNEE ARTHROSCOPY WITH MEDIAL MENISECTOMY Left 01/14/2014   Procedure: KNEE ARTHROSCOPY WITH PARTIAL MEDIAL MENISECTOMY;  Surgeon: Carole Civil, MD;  Location: [facility];  Service: Orthopedics;  Laterality: Left;   KNEE ARTHROSCOPY WITH MEDIAL MENISECTOMY Right 12/12/2018   Procedure: KNEE ARTHROSCOPY WITH MEDIAL MENISCECTOMY;  Surgeon: Carole Civil, MD;  Location: [facility];  Service: Orthopedics;  Laterality: Right;   POLYPECTOMY  04/06/2020   Procedure: POLYPECTOMY;  Surgeon: Eloise Harman, DO;  [facility];  Service: Endoscopy;;   SUPRACERVICAL ABDOMINAL HYSTERECTOMY N/A 08/01/2013   Procedure: HYSTERECTOMY SUPRACERVICAL ABDOMINAL;  Surgeon: Jonnie Kind, MD;  Location: [facility];  Service: Gynecology;  Laterality: N/A;   TOTAL KNEE ARTHROPLASTY Right 03/29/2021   Procedure: TOTAL KNEE ARTHROPLASTY;  Surgeon: Carole Civil, MD;  Location: [facility];  Service: Orthopedics;  Laterality: Right;   TOTAL KNEE ARTHROPLASTY Left 08/01/2022   Procedure: TOTAL KNEE ARTHROPLASTY;  Surgeon: Carole Civil, MD;  Location: [facility];  Service: Orthopedics;  Laterality: Left;   TRANSFORAMINAL LUMBAR INTERBODY FUSION W/ MIS 1 LEVEL Right 06/14/2020   Procedure: Right Lumbar Five- Sacral One Minimally invasive transforaminal lumbar interbody fusion;  Surgeon: Judith Part, MD;  Location: [facility];  Service: Neurosurgery;  Laterality: Right;  Right Lumbar Five- Sacral One Minimally invasive transforaminal lumbar interbody fusion   TUBAL LIGATION     [place]   Patient Active Problem List   Diagnosis Date Noted   Osteoarthritis of left knee  08/01/2022   Left knee pain 06/14/2022   S/P TKR (total knee replacement), right 03/29/2021   Isthmic spondylolisthesis 06/14/2020   Polypharmacy 02/09/2020   Encounter for screening colonoscopy 02/09/2020   DDD (degenerative disc disease), lumbar 12/08/2019   S/P right knee arthroscopy 12/12/18 *with chondroplasty patella  12/19/2018   Borderline diabetes 07/02/2015   Vitamin D deficiency 07/02/2015   Lumbar back pain 07/02/2015   Insomnia 10/29/2013   Arthritis of knee, degenerative 10/13/2013   Hand dermatitis 03/02/2012   Essential hypertension, benign 09/21/2011   Class 2 obesity 09/21/2011   Shoulder pain, right 09/21/2011   Asthma, intermittent 09/21/2011   Tobacco user 09/21/2011    PCP: Celene Squibb, MD  REFERRING PROVIDER: Carole Civil, [place]  REFERRING DIAG: [phone] (ICD-10-CM) - Unilateral primary osteoarthritis, left knee  THERAPY DIAG:  Primary osteoarthritis of left knee  Difficulty in walking, not elsewhere classified  Rationale for Evaluation and Treatment: Rehabilitation  ONSET DATE: 08/01/22  SUBJECTIVE:   SUBJECTIVE STATEMENT: 6/10 today left knee; pain still wakes her at night; takes pain medication before bed; some complaint of ankle pain and swelling today  PERTINENT HISTORY: Right knee 2022 TKA Lumbar fusion 2021  PAIN:  Are you having pain? Yes: NPRS scale: 9/10 Pain location: knee and lower leg Pain description: sore, aching and tight Aggravating factors: walking Relieving factors: medication  PRECAUTIONS: None  WEIGHT BEARING RESTRICTIONS: No  FALLS:  Has patient fallen in last 6 months? Yes. Number of falls 1  LIVING ENVIRONMENT: Lives with: lives with their family and lives with their spouse Lives in: House/apartment Stairs: Yes: External: 2 steps; on right going up, on left going up, and bilateral but cannot reach both Has following equipment at home: Single point cane and Walker - 2  wheeled  OCCUPATION: CNA  PLOF: Independent  PATIENT GOALS: to be able to walk  NEXT MD VISIT: 09/07/2022  OBJECTIVE:   DIAGNOSTIC FINDINGS: IMPRESSION: Status post tricompartmental knee replacement without immediate complicating feature.  CLINICAL DATA:  Left lower extremity pain and edema. History of left knee replacement. Evaluate for acute or chronic DVT.   EXAM: LEFT LOWER EXTREMITY VENOUS DOPPLER ULTRASOUND   TECHNIQUE: Gray-scale sonography with graded compression, as well as color Doppler and duplex ultrasound were performed to evaluate the lower extremity deep venous systems from the level of the common femoral vein and including the common femoral, femoral, profunda femoral, popliteal and calf veins including the posterior tibial, peroneal and gastrocnemius veins when visible. The superficial great saphenous vein was also interrogated. Spectral Doppler was utilized to evaluate flow at rest and with distal augmentation maneuvers in the common femoral, femoral and popliteal veins.   COMPARISON:  Left lower extremity venous Doppler ultrasound-10/01/2013 (negative).   FINDINGS: Contralateral Common Femoral Vein: Respiratory phasicity is normal and symmetric with the symptomatic side. No evidence of thrombus. Normal compressibility.   Common Femoral Vein: No evidence of thrombus. Normal compressibility, respiratory phasicity and response to  augmentation.   Saphenofemoral Junction: No evidence of thrombus. Normal compressibility and flow on color Doppler imaging.   Profunda Femoral Vein: No evidence of thrombus. Normal compressibility and flow on color Doppler imaging.   Femoral Vein: No evidence of thrombus. Normal compressibility, respiratory phasicity and response to augmentation.   Popliteal Vein: No evidence of thrombus. Normal compressibility, respiratory phasicity and response to augmentation.   Calf Veins: No evidence of thrombus. Normal  compressibility and flow on color Doppler imaging.   Superficial Great Saphenous Vein: No evidence of thrombus. Normal compressibility.   Other Findings:  None.   IMPRESSION: No evidence of acute or chronic DVT within the left lower extremity.     Electronically Signed   By: Sandi Mariscal M.D.   On: 08/08/2022 10:24      PATIENT SURVEYS:  FOTO 53  COGNITION: Overall cognitive status: Within functional limits for tasks assessed     SENSATION: Some into left leg and foot but is normalizing  EDEMA:  Yes normal for this time s/p    PALPATION: General soreness only  LOWER EXTREMITY ROM:  Active ROM Right eval Left eval Left 08/29/22 Left 09/14/22  Hip flexion      Hip extension      Hip abduction      Hip adduction      Hip internal rotation      Hip external rotation      Knee flexion  91 104 to 111 following heel slides 120  Knee extension  -10 Lacking 8 -3  Ankle dorsiflexion      Ankle plantarflexion      Ankle inversion      Ankle eversion       (Blank rows = not tested)  LOWER EXTREMITY MMT:  MMT Right eval Left eval  Hip flexion    Hip extension    Hip abduction    Hip adduction    Hip internal rotation    Hip external rotation    Knee flexion  Sitting 4-  Knee extension  4  Ankle dorsiflexion  4+  Ankle plantarflexion    Ankle inversion    Ankle eversion     (Blank rows = not tested)   FUNCTIONAL TESTS:  5 times sit to stand: 20.42 sec 2 minute walk test: with SPC 280 ft  GAIT: Distance walked: 280 ft Assistive device utilized: Single point cane Level of assistance: Modified independence Comments: slow gait speed; decreased gait speed   TODAY'S TREATMENT:                                                                                                                              DATE:  09/14/22 Recumbent bike seat 12 x 5' continuous full revolutions  Standing: Heel/toe raises x 20 Mini squats x 20 Slant board 5 x 20" 8" box  left knee drives for flexion x '2\' 6"'$  box step up 2 x 10 4" lateral box step  ups x 1'  Left knee AROM   -3-120 Supine: Left SLR x 10   09/05/22 Recumbent bike seat 13 x 3' dynamic warm up some full revolutions  Standing: Heel/toe raises 2 x 10 Slant board 5 x 10" 8" box left knee drives for flexion x 2' Hamstring stretch 10" hold x 10 4" step 2 x 10 4" lateral step ups 2 x 10 Hip abduction 2 x 10 each Mini squats x 10  Seated: LAQs 2 x 10 with 2" hold   08/29/22 Recumbent bike  5 minutes rocking for dynamic warm up and conditioning.  Heel slides 10 x10 second holds with strap SLR 2 x 10  Sidelying hip abduction 1 x 10 LLE Step up 4 inch 2 x 10    08/22/22 physical therapy evaluation and HEP instruction    PATIENT EDUCATION:  Education details:08/29/22: HEP; EVAL: Patient educated on exam findings, POC, scope of PT, HEP, and what to expect next week. Person educated: Patient Education method: Explanation, Demonstration, and Handouts Education comprehension: verbalized understanding, returned demonstration, verbal cues required, and tactile cues required   HOME EXERCISE PROGRAM: 09/14/22 Knee drives on step for flexion; ankle 4 way  Access Code: [email or web address] URL: [email or web address]   Date: 08/22/2022 - Supine Ankle Pumps  - 2 x daily - 7 x weekly - 1 sets - 15 reps - Supine Quadricep Sets  - 2 x daily - 7 x weekly - 1 sets - 15 reps - Supine Active Straight Leg Raise  - 2 x daily - 7 x weekly - 1 sets - 15 reps - Supine Short Arc Quad  - 2 x daily - 7 x weekly - 1 sets - 15 reps - Supine Heel Slides  - 2 x daily - 7 x weekly - 1 sets - 15 reps - Seated Long Arc Quad  - 2 x daily - 7 x weekly - 1 sets - 10 reps - 2 sec hold - Seated Knee Flexion Stretch  - 1 x daily - 7 x weekly - 1 sets - 15 reps - Heel Raises with Counter Support  - 1 x daily - 7 x weekly - 1 sets - 10 reps  ASSESSMENT:  CLINICAL IMPRESSION: Today's session continued to focus on  lower extremity mobility and strength. Able to make continuous full revolutions on bike today with good speed.  Increase box height with step ups with good challenge.  Added trial of ankle 4 way for HEP to see if this helps patient ankle pain.  She does have swelling bilateral ankles. Good progress with left knee mobility.   Patient will continue to benefit from physical therapy in order to improve function and reduce impairment.    OBJECTIVE IMPAIRMENTS: Abnormal gait, decreased activity tolerance, decreased endurance, decreased mobility, difficulty walking, decreased ROM, decreased strength, hypomobility, increased edema, increased fascial restrictions, impaired perceived functional ability, increased muscle spasms, impaired flexibility, and pain.   ACTIVITY LIMITATIONS: carrying, lifting, bending, sitting, standing, squatting, sleeping, stairs, transfers, bathing, toileting, locomotion level, and caring for others  PARTICIPATION LIMITATIONS: meal prep, cleaning, laundry, driving, shopping, community activity, occupation, and yard work   [employer] POTENTIAL: Good  CLINICAL DECISION MAKING: Stable/uncomplicated  EVALUATION COMPLEXITY: Low   GOALS: Goals reviewed with patient? No  SHORT TERM GOALS: Target date: 09/05/2022 patient will be independent with initial HEP  Baseline: Goal status: IN PROGRESS  2.  Patient will improve left knee extension to -5 (lacking) to normalize gait pattern/ stance phase with household ambulation  Baseline:  Goal status: IN PROGRESS  LONG TERM GOALS: Target date: 09/19/2022  Patient will be independent in self management strategies to improve quality of life and functional outcomes.   Baseline:  Goal status: IN PROGRESS  2.  Patient will increase  leg MMTs to 5/5 without pain to promote return to ambulation community distances with minimal deviation.    Baseline:  Goal status: IN PROGRESS  3.  Patient will increased knee mobility to -2 to 120 to  promote normal navigation of steps; step over step pattern Baseline:  Goal status: IN PROGRESS  4.  Patient will improve 5 times sit to stand score from 20.42 sec to 15 sec  or less to demonstrate improved functional mobility and increased lower extremity strength.  Baseline:  Goal status: IN PROGRESS  5.  Patient will increase 2 MWT to 350 ft to demonstrate improved functional mobility with community ambulation Baseline:  Goal status: IN PROGRESS  PLAN:  PT FREQUENCY: 2x/week  PT DURATION: 4 weeks  PLANNED INTERVENTIONS: Therapeutic exercises, Therapeutic activity, Neuromuscular re-education, Balance training, Gait training, Patient/Family education, Joint manipulation, Joint mobilization, Stair training, Orthotic/Fit training, DME instructions, Aquatic Therapy, Dry Needling, Electrical stimulation, Spinal manipulation, Spinal mobilization, Cryotherapy, Moist heat, Compression bandaging, scar mobilization, Splintting, Taping, Traction, Ultrasound, Ionotophoresis '4mg'$ /ml Dexamethasone, and Manual therapy   PLAN FOR NEXT SESSION: progress knee mobility and strength, gait training, balance, transfers; sees MD 10/19/2022   12:00 PM, 09/14/22 Carlita Whitcomb Small Zahrah Sutherlin MPT [facility] [place] [phone] [phone]

## 2022-09-18 ENCOUNTER — Ambulatory Visit (HOSPITAL_COMMUNITY): Payer: BC Managed Care – PPO | Attending: Orthopedic Surgery | Admitting: Physical Therapy

## 2022-09-18 ENCOUNTER — Encounter (HOSPITAL_COMMUNITY): Payer: Self-pay | Admitting: Physical Therapy

## 2022-09-18 DIAGNOSIS — M1712 Unilateral primary osteoarthritis, left knee: Secondary | ICD-10-CM | POA: Diagnosis not present

## 2022-09-18 DIAGNOSIS — M25562 Pain in left knee: Secondary | ICD-10-CM | POA: Insufficient documentation

## 2022-09-18 DIAGNOSIS — M6281 Muscle weakness (generalized): Secondary | ICD-10-CM | POA: Diagnosis not present

## 2022-09-18 DIAGNOSIS — R262 Difficulty in walking, not elsewhere classified: Secondary | ICD-10-CM | POA: Diagnosis not present

## 2022-09-18 DIAGNOSIS — R29898 Other symptoms and signs involving the musculoskeletal system: Secondary | ICD-10-CM | POA: Insufficient documentation

## 2022-09-18 NOTE — Therapy (Signed)
OUTPATIENT PHYSICAL THERAPY TREATMENT   Patient Name: Robin Sherman MRN: [id number] DOB:1960-11-26, 62 y.o., female Today's Date: 09/18/2022  Progress Note   Reporting Period 08/22/22 to 09/18/22   See note below for Objective Data and Assessment of Progress/Goals   END OF SESSION:  PT End of Session - 09/18/22 0944     Visit Number 5    Number of Visits 16    Date for PT Re-Evaluation 10/16/22    Authorization Type [employer] P[employer]    Authorization Time Period 60 visit limit; no auth; no copay    Authorization - Visit Number 5    Authorization - Number of Visits 60    Progress Note Due on Visit 15    PT Start Time 0945    PT Stop Time 1024    PT Time Calculation (min) 39 min    Activity Tolerance Patient tolerated treatment well    Behavior During Therapy WFL for tasks assessed/performed             Past Medical History:  Diagnosis Date   Allergy    pollen   Arthritis    left knee   Asthma    Eczema    Followed by Dr. Nevada Sherman dermatology   Encounter for screening colonoscopy 02/09/2020   Headache(784.0) 09/25/2012   Hypertension    Neuromuscular disorder ([place])    Pre-diabetes    sciatica right side 10/13/2013   TRIGGER FINGER 11/03/2008   Qualifier: Diagnosis of  By: Robin Sherman, Robin Sherman     Past Surgical History:  Procedure Laterality Date   ABDOMINAL HYSTERECTOMY     bleeding   BILATERAL SALPINGECTOMY Bilateral 08/01/2013   Procedure: BILATERAL SALPINGECTOMY;  Surgeon: Robin Kind, Sherman;  Location: [facility];  Service: Gynecology;  Laterality: Bilateral;   CHONDROPLASTY Left 01/14/2014   Procedure: CHONDROPLASTY OF FEMUR;  Surgeon: Robin Civil, Sherman;  Location: [facility];  Service: Orthopedics;  Laterality: Left;   CHONDROPLASTY Right 12/12/2018   Procedure: CHONDROPLASTY;  Surgeon: Robin Civil, Sherman;  Location: [facility];  Service: Orthopedics;  Laterality: Right;   COLONOSCOPY N/A 01/27/2013   Dr. Oneida Sherman: Right colon with poor prep (patient ate [place] necks  the night before the procedure).  Moderate diverticulosis in the sigmoid colon, moderate hemorrhoids.   COLONOSCOPY WITH PROPOFOL N/A 04/06/2020   Procedure: COLONOSCOPY WITH PROPOFOL;  Surgeon: Robin Harman, DO;  [facility];  Service: Endoscopy;  Laterality: N/A;  8:15am   HEMATOMA EVACUATION N/A 08/03/2013   Procedure: EVACUATION PELVIC HEMATOMA;  Surgeon: Robin Kind, Sherman;  Location: [facility];  Service: Gynecology;  Laterality: N/A;   KNEE ARTHROSCOPY WITH MEDIAL MENISECTOMY Left 01/14/2014   Procedure: KNEE ARTHROSCOPY WITH PARTIAL MEDIAL MENISECTOMY;  Surgeon: Robin Civil, Sherman;  Location: [facility];  Service: Orthopedics;  Laterality: Left;   KNEE ARTHROSCOPY WITH MEDIAL MENISECTOMY Right 12/12/2018   Procedure: KNEE ARTHROSCOPY WITH MEDIAL MENISCECTOMY;  Surgeon: Robin Civil, Sherman;  Location: [facility];  Service: Orthopedics;  Laterality: Right;   POLYPECTOMY  04/06/2020   Procedure: POLYPECTOMY;  Surgeon: Robin Harman, DO;  [facility];  Service: Endoscopy;;   SUPRACERVICAL ABDOMINAL HYSTERECTOMY N/A 08/01/2013   Procedure: HYSTERECTOMY SUPRACERVICAL ABDOMINAL;  Surgeon: Robin Kind, Sherman;  Location: [facility];  Service: Gynecology;  Laterality: N/A;   TOTAL KNEE ARTHROPLASTY Right 03/29/2021   Procedure: TOTAL KNEE ARTHROPLASTY;  Surgeon: Robin Civil, Sherman;  Location: [facility];  Service: Orthopedics;  Laterality: Right;  TOTAL KNEE ARTHROPLASTY Left 08/01/2022   Procedure: TOTAL KNEE ARTHROPLASTY;  Surgeon: Robin Civil, Sherman;  Location: [facility];  Service: Orthopedics;  Laterality: Left;   TRANSFORAMINAL LUMBAR INTERBODY FUSION W/ MIS 1 LEVEL Right 06/14/2020   Procedure: Right Lumbar Five- Sacral One Minimally invasive transforaminal lumbar interbody fusion;  Surgeon: Robin Part, Sherman;  Location: [facility];  Service: Neurosurgery;  Laterality: Right;  Right Lumbar Five- Sacral One Minimally invasive transforaminal lumbar interbody fusion   TUBAL  LIGATION     [name]   Patient Active Problem List   Diagnosis Date Noted   Osteoarthritis of left knee 08/01/2022   Left knee pain 06/14/2022   S/P TKR (total knee replacement), right 03/29/2021   Isthmic spondylolisthesis 06/14/2020   Polypharmacy 02/09/2020   Encounter for screening colonoscopy 02/09/2020   DDD (degenerative disc disease), lumbar 12/08/2019   S/P right knee arthroscopy 12/12/18 *with chondroplasty patella  12/19/2018   Borderline diabetes 07/02/2015   Vitamin D deficiency 07/02/2015   Lumbar back pain 07/02/2015   Insomnia 10/29/2013   Arthritis of knee, degenerative 10/13/2013   Hand dermatitis 03/02/2012   Essential hypertension, benign 09/21/2011   Class 2 obesity 09/21/2011   Shoulder pain, right 09/21/2011   Asthma, intermittent 09/21/2011   Tobacco user 09/21/2011    PCP: Robin Squibb, Sherman  REFERRING PROVIDER: Carole Sherman, [place]  REFERRING DIAG: [phone] (ICD-10-CM) - Unilateral primary osteoarthritis, left knee  THERAPY DIAG:  Primary osteoarthritis of left knee  Difficulty in walking, not elsewhere classified  Acute pain of left knee  Rationale for Evaluation and Treatment: Rehabilitation  ONSET DATE: 08/01/22  SUBJECTIVE:   SUBJECTIVE STATEMENT: Knee seems to be getting better. HEP is going well. Patient states 60% improvement since beginning PT. Remaining deficits in pain, ROM, and lack of sensation in proximal tibial region. Been working on steps at home.   PERTINENT HISTORY: Right knee 2022 TKA Lumbar fusion 2021  PAIN:  Are you having pain? Yes: NPRS scale: 6/10 Pain location: knee and lower leg Pain description: sore, aching and tight Aggravating factors: walking Relieving factors: medication  PRECAUTIONS: None  WEIGHT BEARING RESTRICTIONS: No  FALLS:  Has patient fallen in last 6 months? Yes. Number of falls 1  LIVING ENVIRONMENT: Lives with: lives with their family and lives  with their spouse Lives in: House/apartment Stairs: Yes: External: 2 steps; on right going up, on left going up, and bilateral but cannot reach both Has following equipment at home: Single point cane and Walker - 2 wheeled  OCCUPATION: CNA  PLOF: Independent  PATIENT GOALS: to be able to walk  NEXT Sherman VISIT: 09/07/2022  OBJECTIVE:   DIAGNOSTIC FINDINGS: IMPRESSION: Status post tricompartmental knee replacement without immediate complicating feature.  CLINICAL DATA:  Left lower extremity pain and edema. History of left knee replacement. Evaluate for acute or chronic DVT.   EXAM: LEFT LOWER EXTREMITY VENOUS DOPPLER ULTRASOUND   TECHNIQUE: Gray-scale sonography with graded compression, as well as color Doppler and duplex ultrasound were performed to evaluate the lower extremity deep venous systems from the level of the common femoral vein and including the common femoral, femoral, profunda femoral, popliteal and calf veins including the posterior tibial, peroneal and gastrocnemius veins when visible. The superficial great saphenous vein was also interrogated. Spectral Doppler was utilized to evaluate flow at rest and with distal augmentation maneuvers in the common femoral, femoral and popliteal veins.   COMPARISON:  Left lower extremity venous  Doppler ultrasound-10/01/2013 (negative).   FINDINGS: Contralateral Common Femoral Vein: Respiratory phasicity is normal and symmetric with the symptomatic side. No evidence of thrombus. Normal compressibility.   Common Femoral Vein: No evidence of thrombus. Normal compressibility, respiratory phasicity and response to augmentation.   Saphenofemoral Junction: No evidence of thrombus. Normal compressibility and flow on color Doppler imaging.   Profunda Femoral Vein: No evidence of thrombus. Normal compressibility and flow on color Doppler imaging.   Femoral Vein: No evidence of thrombus. Normal compressibility, respiratory  phasicity and response to augmentation.   Popliteal Vein: No evidence of thrombus. Normal compressibility, respiratory phasicity and response to augmentation.   Calf Veins: No evidence of thrombus. Normal compressibility and flow on color Doppler imaging.   Superficial Great Saphenous Vein: No evidence of thrombus. Normal compressibility.   Other Findings:  None.   IMPRESSION: No evidence of acute or chronic DVT within the left lower extremity.     Electronically Signed   By: Sandi Mariscal M.D.   On: 08/08/2022 10:24      PATIENT SURVEYS:  FOTO 53  COGNITION: Overall cognitive status: Within functional limits for tasks assessed     SENSATION: Some into left leg and foot but is normalizing  EDEMA:  Yes normal for this time s/p    PALPATION: General soreness only  LOWER EXTREMITY ROM:  Active ROM Right eval Left eval Left 08/29/22 Left 09/14/22 Left 09/18/22  Hip flexion       Hip extension       Hip abduction       Hip adduction       Hip internal rotation       Hip external rotation       Knee flexion  91 104 to 111 following heel slides 120 121  Knee extension  -10 Lacking 8 -3 Lacking 3  Ankle dorsiflexion       Ankle plantarflexion       Ankle inversion       Ankle eversion        (Blank rows = not tested)  LOWER EXTREMITY MMT:  MMT Right eval Left eval Left 09/18/22  Hip flexion     Hip extension     Hip abduction     Hip adduction     Hip internal rotation     Hip external rotation     Knee flexion  Sitting 4- Sitting 5  Knee extension  4 4+  Ankle dorsiflexion  4+ 5  Ankle plantarflexion     Ankle inversion     Ankle eversion      (Blank rows = not tested)   FUNCTIONAL TESTS:  5 times sit to stand: 20.42 sec 2 minute walk test: with SPC 280 ft  GAIT: Distance walked: 280 ft Assistive device utilized: Single point cane Level of assistance: Modified independence Comments: slow gait speed; decreased gait speed  Reassessment  09/18/22 FUNCTIONAL TESTS:  5 times sit to stand: 13.70 seconds sec 2 minute walk test:  450 ft Stairs: 7 inch, alternating pattern, increased effort required with ascending on L stance  GAIT: Distance walked: 450 ft Assistive device utilized: none Level of assistance: independent Comments: antalgic on LLE, lacking TKE in stance  TODAY'S TREATMENT:  DATE:  09/18/22 Recumbent bike seat 12 x 5' continuous full revolutions Reassessment  Lateral step up 4 inch 1 x 10 LLE Lateral step down 4 inch 1 x 10 LLE Step up with knee drive 6 inch 1 x 10  09/14/22 Recumbent bike seat 12 x 5' continuous full revolutions  Standing: Heel/toe raises x 20 Mini squats x 20 Slant board 5 x 20" 8" box left knee drives for flexion x '2\' 6"'$  box step up 2 x 10 4" lateral box step ups x 1'  Left knee AROM   -3-120 Supine: Left SLR x 10   09/05/22 Recumbent bike seat 13 x 3' dynamic warm up some full revolutions  Standing: Heel/toe raises 2 x 10 Slant board 5 x 10" 8" box left knee drives for flexion x 2' Hamstring stretch 10" hold x 10 4" step 2 x 10 4" lateral step ups 2 x 10 Hip abduction 2 x 10 each Mini squats x 10  Seated: LAQs 2 x 10 with 2" hold   08/29/22 Recumbent bike  5 minutes rocking for dynamic warm up and conditioning.  Heel slides 10 x10 second holds with strap SLR 2 x 10  Sidelying hip abduction 1 x 10 LLE Step up 4 inch 2 x 10    08/22/22 physical therapy evaluation and HEP instruction    PATIENT EDUCATION:  Education details: 09/18/22: reassessment findings, POC, HEP; 08/29/22: HEP; EVAL: Patient educated on exam findings, POC, scope of PT, HEP, and what to expect next week. Person educated: Patient Education method: Explanation, Demonstration, and Handouts Education comprehension: verbalized understanding, returned demonstration, verbal cues  required, and tactile cues required   HOME EXERCISE PROGRAM: 09/14/22 Knee drives on step for flexion; ankle 4 way  Access Code: [email or web address] URL: [email or web address]   Date: 08/22/2022 - Supine Ankle Pumps  - 2 x daily - 7 x weekly - 1 sets - 15 reps - Supine Quadricep Sets  - 2 x daily - 7 x weekly - 1 sets - 15 reps - Supine Active Straight Leg Raise  - 2 x daily - 7 x weekly - 1 sets - 15 reps - Supine Short Arc Quad  - 2 x daily - 7 x weekly - 1 sets - 15 reps - Supine Heel Slides  - 2 x daily - 7 x weekly - 1 sets - 15 reps - Seated Long Arc Quad  - 2 x daily - 7 x weekly - 1 sets - 10 reps - 2 sec hold - Seated Knee Flexion Stretch  - 1 x daily - 7 x weekly - 1 sets - 15 reps - Heel Raises with Counter Support  - 1 x daily - 7 x weekly - 1 sets - 10 reps  ASSESSMENT:  CLINICAL IMPRESSION: Patient has met 2/2 short term goals and 3/5 long term goals with ability to complete HEP and improvement in symptoms, strength, ROM, activity tolerance, gait, balance, and functional mobility. Remaining goals not met due to continued deficits in symptoms, strength, end range ROM, gait, functional mobility, and activity tolerance. Patient has made good progress toward remaining goals and is progressing very well. Able to progress quad strengthening exercises. Patient will continue to benefit from skilled physical therapy in order to improve function and reduce impairment.     OBJECTIVE IMPAIRMENTS: Abnormal gait, decreased activity tolerance, decreased endurance, decreased mobility, difficulty walking, decreased ROM, decreased strength, hypomobility, increased edema, increased fascial restrictions, impaired perceived functional ability, increased muscle spasms, impaired flexibility, and  pain.   ACTIVITY LIMITATIONS: carrying, lifting, bending, sitting, standing, squatting, sleeping, stairs, transfers, bathing, toileting, locomotion level, and caring for others  PARTICIPATION  LIMITATIONS: meal prep, cleaning, laundry, driving, shopping, community activity, occupation, and yard work   [employer] POTENTIAL: Good  CLINICAL DECISION MAKING: Stable/uncomplicated  EVALUATION COMPLEXITY: Low   GOALS: Goals reviewed with patient? No  SHORT TERM GOALS: Target date: 09/05/2022 patient will be independent with initial HEP  Baseline: Goal status: MET  2.  Patient will improve left knee extension to -5 (lacking) to normalize gait pattern/ stance phase with household ambulation Baseline:  Goal status: MET  LONG TERM GOALS: Target date: 09/19/2022  Patient will be independent in self management strategies to improve quality of life and functional outcomes.   Baseline:  Goal status: MET  2.  Patient will increase  leg MMTs to 5/5 without pain to promote return to ambulation community distances with minimal deviation.    Baseline:  Goal status: IN PROGRESS  3.  Patient will increased knee mobility to -2 to 120 to promote normal navigation of steps; step over step pattern Baseline:  Goal status: IN PROGRESS  4.  Patient will improve 5 times sit to stand score from 20.42 sec to 15 sec  or less to demonstrate improved functional mobility and increased lower extremity strength.  Baseline:  09/18/22 13.7 seconds without UE use Goal status: MET  5.  Patient will increase 2 MWT to 350 ft to demonstrate improved functional mobility with community ambulation Baseline: 09/18/22 450 feet Goal status: MET  PLAN:  PT FREQUENCY: 2x/week  PT DURATION: 4 weeks  PLANNED INTERVENTIONS: Therapeutic exercises, Therapeutic activity, Neuromuscular re-education, Balance training, Gait training, Patient/Family education, Joint manipulation, Joint mobilization, Stair training, Orthotic/Fit training, DME instructions, Aquatic Therapy, Dry Needling, Electrical stimulation, Spinal manipulation, Spinal mobilization, Cryotherapy, Moist heat, Compression bandaging, scar mobilization,  Splintting, Taping, Traction, Ultrasound, Ionotophoresis '4mg'$ /ml Dexamethasone, and Manual therapy   PLAN FOR NEXT SESSION: progress knee mobility and strength, gait training, balance, transfers; sees Sherman 10/19/2022   10:14 AM, 09/18/22 Mearl Latin PT, DPT Physical Therapist at [facility]

## 2022-09-19 ENCOUNTER — Encounter (HOSPITAL_COMMUNITY): Payer: BC Managed Care – PPO

## 2022-09-21 ENCOUNTER — Telehealth (HOSPITAL_COMMUNITY): Payer: Self-pay

## 2022-09-21 ENCOUNTER — Encounter (HOSPITAL_COMMUNITY): Payer: BC Managed Care – PPO

## 2022-09-21 DIAGNOSIS — M5126 Other intervertebral disc displacement, lumbar region: Secondary | ICD-10-CM | POA: Diagnosis not present

## 2022-09-21 DIAGNOSIS — M5416 Radiculopathy, lumbar region: Secondary | ICD-10-CM | POA: Diagnosis not present

## 2022-09-21 DIAGNOSIS — M48061 Spinal stenosis, lumbar region without neurogenic claudication: Secondary | ICD-10-CM | POA: Diagnosis not present

## 2022-09-21 NOTE — Telephone Encounter (Signed)
Spoke with patient on the phone; she got her appointment time mixed up and thought her appointment was later today at 10:30.  Reminded her of her next appointment this coming Tuesday at 11:15; states she plans to attend.  8:51 AM, 09/21/22 Jeydan Barner Small Mayra Jolliffe MPT [facility] [place] [phone]

## 2022-09-26 ENCOUNTER — Telehealth: Payer: Self-pay

## 2022-09-26 ENCOUNTER — Ambulatory Visit (HOSPITAL_COMMUNITY): Payer: BC Managed Care – PPO | Admitting: Physical Therapy

## 2022-09-26 ENCOUNTER — Encounter (HOSPITAL_COMMUNITY): Payer: Self-pay | Admitting: Physical Therapy

## 2022-09-26 DIAGNOSIS — M1712 Unilateral primary osteoarthritis, left knee: Secondary | ICD-10-CM | POA: Diagnosis not present

## 2022-09-26 DIAGNOSIS — M25562 Pain in left knee: Secondary | ICD-10-CM | POA: Diagnosis not present

## 2022-09-26 DIAGNOSIS — R262 Difficulty in walking, not elsewhere classified: Secondary | ICD-10-CM | POA: Diagnosis not present

## 2022-09-26 DIAGNOSIS — R29898 Other symptoms and signs involving the musculoskeletal system: Secondary | ICD-10-CM | POA: Diagnosis not present

## 2022-09-26 DIAGNOSIS — M6281 Muscle weakness (generalized): Secondary | ICD-10-CM | POA: Diagnosis not present

## 2022-09-26 MED ORDER — HYDROCODONE-ACETAMINOPHEN 10-325 MG PO TABS: 1.0000 | ORAL_TABLET | Freq: Four times a day (QID) | ORAL | 0 refills | Status: DC | PRN

## 2022-09-26 NOTE — Therapy (Signed)
OUTPATIENT PHYSICAL THERAPY TREATMENT   Patient Name: Robin Sherman MRN: [id number] DOB:10/17/1960, 62 y.o., female Today's Date: 09/26/2022  Progress Note   Reporting Period 08/22/22 to 09/18/22   See note below for Objective Data and Assessment of Progress/Goals   END OF SESSION:  PT End of Session - 09/26/22 1132     Visit Number 6    Number of Visits 16    Date for PT Re-Evaluation 10/16/22    Authorization Type [employer] P[employer]    Authorization Time Period 60 visit limit; no auth; no copay    Authorization - Visit Number 6    Authorization - Number of Visits 60    Progress Note Due on Visit 15    PT Start Time 1132    PT Stop Time 1205    PT Time Calculation (min) 33 min    Activity Tolerance Patient tolerated treatment well    Behavior During Therapy WFL for tasks assessed/performed             Past Medical History:  Diagnosis Date   Allergy    pollen   Arthritis    left knee   Asthma    Eczema    Followed by Dr. Nevada Crane dermatology   Encounter for screening colonoscopy 02/09/2020   Headache(784.0) 09/25/2012   Hypertension    Neuromuscular disorder ([place])    Pre-diabetes    sciatica right side 10/13/2013   TRIGGER FINGER 11/03/2008   Qualifier: Diagnosis of  By: Aline Brochure MD, Dorothyann Peng     Past Surgical History:  Procedure Laterality Date   ABDOMINAL HYSTERECTOMY     bleeding   BILATERAL SALPINGECTOMY Bilateral 08/01/2013   Procedure: BILATERAL SALPINGECTOMY;  Surgeon: Jonnie Kind, MD;  Location: [facility];  Service: Gynecology;  Laterality: Bilateral;   CHONDROPLASTY Left 01/14/2014   Procedure: CHONDROPLASTY OF FEMUR;  Surgeon: Carole Civil, MD;  Location: [facility];  Service: Orthopedics;  Laterality: Left;   CHONDROPLASTY Right 12/12/2018   Procedure: CHONDROPLASTY;  Surgeon: Carole Civil, MD;  Location: [facility];  Service: Orthopedics;  Laterality: Right;   COLONOSCOPY N/A 01/27/2013   Dr. Oneida Alar: Right colon with poor prep (patient ate [place] necks  the night before the procedure).  Moderate diverticulosis in the sigmoid colon, moderate hemorrhoids.   COLONOSCOPY WITH PROPOFOL N/A 04/06/2020   Procedure: COLONOSCOPY WITH PROPOFOL;  Surgeon: Eloise Harman, DO;  [facility]: [facility];  Service: Endoscopy;  Laterality: N/A;  8:15am   HEMATOMA EVACUATION N/A 08/03/2013   Procedure: EVACUATION PELVIC HEMATOMA;  Surgeon: Jonnie Kind, MD;  Location: [facility];  Service: Gynecology;  Laterality: N/A;   KNEE ARTHROSCOPY WITH MEDIAL MENISECTOMY Left 01/14/2014   Procedure: KNEE ARTHROSCOPY WITH PARTIAL MEDIAL MENISECTOMY;  Surgeon: Carole Civil, MD;  Location: [facility];  Service: Orthopedics;  Laterality: Left;   KNEE ARTHROSCOPY WITH MEDIAL MENISECTOMY Right 12/12/2018   Procedure: KNEE ARTHROSCOPY WITH MEDIAL MENISCECTOMY;  Surgeon: Carole Civil, MD;  Location: [facility];  Service: Orthopedics;  Laterality: Right;   POLYPECTOMY  04/06/2020   Procedure: POLYPECTOMY;  Surgeon: Eloise Harman, DO;  [facility];  Service: Endoscopy;;   SUPRACERVICAL ABDOMINAL HYSTERECTOMY N/A 08/01/2013   Procedure: HYSTERECTOMY SUPRACERVICAL ABDOMINAL;  Surgeon: Jonnie Kind, MD;  Location: [facility];  Service: Gynecology;  Laterality: N/A;   TOTAL KNEE ARTHROPLASTY Right 03/29/2021   Procedure: TOTAL KNEE ARTHROPLASTY;  Surgeon: Carole Civil, MD;  Location: [facility];  Service: Orthopedics;  Laterality: Right;  TOTAL KNEE ARTHROPLASTY Left 08/01/2022   Procedure: TOTAL KNEE ARTHROPLASTY;  Surgeon: Carole Civil, MD;  Location: [facility];  Service: Orthopedics;  Laterality: Left;   TRANSFORAMINAL LUMBAR INTERBODY FUSION W/ MIS 1 LEVEL Right 06/14/2020   Procedure: Right Lumbar Five- Sacral One Minimally invasive transforaminal lumbar interbody fusion;  Surgeon: Judith Part, MD;  Location: [facility];  Service: Neurosurgery;  Laterality: Right;  Right Lumbar Five- Sacral One Minimally invasive transforaminal lumbar interbody fusion   [place]   Patient Active Problem List   Diagnosis Date Noted   Osteoarthritis of left knee 08/01/2022   Left knee pain 06/14/2022   S/P TKR (total knee replacement), right 03/29/2021   Isthmic spondylolisthesis 06/14/2020   Polypharmacy 02/09/2020   Encounter for screening colonoscopy 02/09/2020   DDD (degenerative disc disease), lumbar 12/08/2019   S/P right knee arthroscopy 12/12/18 *with chondroplasty patella  12/19/2018   Borderline diabetes 07/02/2015   Vitamin D deficiency 07/02/2015   Lumbar back pain 07/02/2015   Insomnia 10/29/2013   Arthritis of knee, degenerative 10/13/2013   Hand dermatitis 03/02/2012   Essential hypertension, benign 09/21/2011   Class 2 obesity 09/21/2011   Shoulder pain, right 09/21/2011   Asthma, intermittent 09/21/2011   Tobacco user 09/21/2011    PCP: Celene Squibb, MD  REFERRING PROVIDER: Carole Civil, [place]  REFERRING DIAG: [phone] (ICD-10-CM) - Unilateral primary osteoarthritis, left knee  THERAPY DIAG:  Primary osteoarthritis of left knee  Difficulty in walking, not elsewhere classified  Acute pain of left knee  Rationale for Evaluation and Treatment: Rehabilitation  ONSET DATE: 08/01/22  SUBJECTIVE:   SUBJECTIVE STATEMENT: Patient states knee knee hurting. Same symptoms in leg. Patient was here but check in didn't go through.   PERTINENT HISTORY: Right knee 2022 TKA Lumbar fusion 2021  PAIN:  Are you having pain? Yes: NPRS scale: 6/10 Pain location: knee and lower leg Pain description: sore, aching and tight Aggravating factors: walking Relieving factors: medication  PRECAUTIONS: None  WEIGHT BEARING RESTRICTIONS: No  FALLS:  Has patient fallen in last 6 months? Yes. Number of falls 1  LIVING ENVIRONMENT: Lives with: lives with their family and lives with their spouse Lives in: House/apartment Stairs: Yes: External: 2 steps; on right going up, on left going  up, and bilateral but cannot reach both Has following equipment at home: Single point cane and Walker - 2 wheeled  OCCUPATION: CNA  PLOF: Independent  PATIENT GOALS: to be able to walk  NEXT MD VISIT: 09/07/2022  OBJECTIVE:   DIAGNOSTIC FINDINGS: IMPRESSION: Status post tricompartmental knee replacement without immediate complicating feature.  CLINICAL DATA:  Left lower extremity pain and edema. History of left knee replacement. Evaluate for acute or chronic DVT.   EXAM: LEFT LOWER EXTREMITY VENOUS DOPPLER ULTRASOUND   TECHNIQUE: Gray-scale sonography with graded compression, as well as color Doppler and duplex ultrasound were performed to evaluate the lower extremity deep venous systems from the level of the common femoral vein and including the common femoral, femoral, profunda femoral, popliteal and calf veins including the posterior tibial, peroneal and gastrocnemius veins when visible. The superficial great saphenous vein was also interrogated. Spectral Doppler was utilized to evaluate flow at rest and with distal augmentation maneuvers in the common femoral, femoral and popliteal veins.   COMPARISON:  Left lower extremity venous Doppler ultrasound-10/01/2013 (negative).   FINDINGS: Contralateral Common Femoral Vein: Respiratory phasicity is normal and symmetric with the  symptomatic side. No evidence of thrombus. Normal compressibility.   Common Femoral Vein: No evidence of thrombus. Normal compressibility, respiratory phasicity and response to augmentation.   Saphenofemoral Junction: No evidence of thrombus. Normal compressibility and flow on color Doppler imaging.   Profunda Femoral Vein: No evidence of thrombus. Normal compressibility and flow on color Doppler imaging.   Femoral Vein: No evidence of thrombus. Normal compressibility, respiratory phasicity and response to augmentation.   Popliteal Vein: No evidence of thrombus. Normal  compressibility, respiratory phasicity and response to augmentation.   Calf Veins: No evidence of thrombus. Normal compressibility and flow on color Doppler imaging.   Superficial Great Saphenous Vein: No evidence of thrombus. Normal compressibility.   Other Findings:  None.   IMPRESSION: No evidence of acute or chronic DVT within the left lower extremity.     Electronically Signed   By: Sandi Mariscal M.D.   On: 08/08/2022 10:24      PATIENT SURVEYS:  FOTO 53  COGNITION: Overall cognitive status: Within functional limits for tasks assessed     SENSATION: Some into left leg and foot but is normalizing  EDEMA:  Yes normal for this time s/p    PALPATION: General soreness only  LOWER EXTREMITY ROM:  Active ROM Right eval Left eval Left 08/29/22 Left 09/14/22 Left 09/18/22 Left 09/26/22  Hip flexion        Hip extension        Hip abduction        Hip adduction        Hip internal rotation        Hip external rotation        Knee flexion  91 104 to 111 following heel slides 120 121 120  Knee extension  -10 Lacking 8 -3 Lacking 3 Lacking 3  Ankle dorsiflexion        Ankle plantarflexion        Ankle inversion        Ankle eversion         (Blank rows = not tested)  LOWER EXTREMITY MMT:  MMT Right eval Left eval Left 09/18/22  Hip flexion     Hip extension     Hip abduction     Hip adduction     Hip internal rotation     Hip external rotation     Knee flexion  Sitting 4- Sitting 5  Knee extension  4 4+  Ankle dorsiflexion  4+ 5  Ankle plantarflexion     Ankle inversion     Ankle eversion      (Blank rows = not tested)   FUNCTIONAL TESTS:  5 times sit to stand: 20.42 sec 2 minute walk test: with SPC 280 ft  GAIT: Distance walked: 280 ft Assistive device utilized: Single point cane Level of assistance: Modified independence Comments: slow gait speed; decreased gait speed  Reassessment 09/18/22 FUNCTIONAL TESTS:  5 times sit to stand: 13.70  seconds sec 2 minute walk test:  450 ft Stairs: 7 inch, alternating pattern, increased effort required with ascending on L stance  GAIT: Distance walked: 450 ft Assistive device utilized: none Level of assistance: independent Comments: antalgic on LLE, lacking TKE in stance  TODAY'S TREATMENT:  DATE:  09/26/22 Recumbent bike seat 12 x 5' continuous full revolutions Hip abd/add iso 10 x 10 second holds each Prone quad stretch with manual assist 5 x 20-30 second holds Sidelying hip abduction 2 x 10 LLE  09/18/22 Recumbent bike seat 12 x 5' continuous full revolutions Reassessment  Lateral step up 4 inch 1 x 10 LLE Lateral step down 4 inch 1 x 10 LLE Step up with knee drive 6 inch 1 x 10  09/14/22 Recumbent bike seat 12 x 5' continuous full revolutions  Standing: Heel/toe raises x 20 Mini squats x 20 Slant board 5 x 20" 8" box left knee drives for flexion x '2\' 6"'$  box step up 2 x 10 4" lateral box step ups x 1'  Left knee AROM   -3-120 Supine: Left SLR x 10   09/05/22 Recumbent bike seat 13 x 3' dynamic warm up some full revolutions  Standing: Heel/toe raises 2 x 10 Slant board 5 x 10" 8" box left knee drives for flexion x 2' Hamstring stretch 10" hold x 10 4" step 2 x 10 4" lateral step ups 2 x 10 Hip abduction 2 x 10 each Mini squats x 10  Seated: LAQs 2 x 10 with 2" hold   08/29/22 Recumbent bike  5 minutes rocking for dynamic warm up and conditioning.  Heel slides 10 x10 second holds with strap SLR 2 x 10  Sidelying hip abduction 1 x 10 LLE Step up 4 inch 2 x 10    08/22/22 physical therapy evaluation and HEP instruction    PATIENT EDUCATION:  Education details: 09/26/22: gait mechanics, HEP, TKA progression3/4/24: reassessment findings, POC, HEP; 08/29/22: HEP; EVAL: Patient educated on exam findings, POC, scope of PT, HEP, and what to  expect next week. Person educated: Patient Education method: Explanation, Demonstration, and Handouts Education comprehension: verbalized understanding, returned demonstration, verbal cues required, and tactile cues required   HOME EXERCISE PROGRAM: Access Code: [email or web address] URL: [email or web address]  09/26/22 - Prone Quadriceps Stretch with Strap  - 1 x daily - 7 x weekly - 5-10 reps - 20 second hold - Sidelying Hip Abduction (Mirrored)  - 1 x daily - 7 x weekly - 2-3 sets - 10 reps  09/14/22 Knee drives on step for flexion; ankle 4 way  Date: 08/22/2022 - Supine Ankle Pumps  - 2 x daily - 7 x weekly - 1 sets - 15 reps - Supine Quadricep Sets  - 2 x daily - 7 x weekly - 1 sets - 15 reps - Supine Active Straight Leg Raise  - 2 x daily - 7 x weekly - 1 sets - 15 reps - Supine Short Arc Quad  - 2 x daily - 7 x weekly - 1 sets - 15 reps - Supine Heel Slides  - 2 x daily - 7 x weekly - 1 sets - 15 reps - Seated Long Arc Quad  - 2 x daily - 7 x weekly - 1 sets - 10 reps - 2 sec hold - Seated Knee Flexion Stretch  - 1 x daily - 7 x weekly - 1 sets - 15 reps - Heel Raises with Counter Support  - 1 x daily - 7 x weekly - 1 sets - 10 reps  ASSESSMENT:  CLINICAL IMPRESSION: Patient was here but check in did not go through. Patient ambulates with LLE abduction with antalgic gait. Cueing for RLE adduction with continued antalgic gait and decreased stride length noted. Began additional hip strengthening and mobility.  Decrease in symptoms following prone quad stretch with improving gait pattern but still impaired. Patient will continue to benefit from skilled physical therapy in order to improve function and reduce impairment.     OBJECTIVE IMPAIRMENTS: Abnormal gait, decreased activity tolerance, decreased endurance, decreased mobility, difficulty walking, decreased ROM, decreased strength, hypomobility, increased edema, increased fascial restrictions, impaired perceived functional  ability, increased muscle spasms, impaired flexibility, and pain.   ACTIVITY LIMITATIONS: carrying, lifting, bending, sitting, standing, squatting, sleeping, stairs, transfers, bathing, toileting, locomotion level, and caring for others  PARTICIPATION LIMITATIONS: meal prep, cleaning, laundry, driving, shopping, community activity, occupation, and yard work   [employer] POTENTIAL: Good  CLINICAL DECISION MAKING: Stable/uncomplicated  EVALUATION COMPLEXITY: Low   GOALS: Goals reviewed with patient? No  SHORT TERM GOALS: Target date: 09/05/2022 patient will be independent with initial HEP  Baseline: Goal status: MET  2.  Patient will improve left knee extension to -5 (lacking) to normalize gait pattern/ stance phase with household ambulation Baseline:  Goal status: MET  LONG TERM GOALS: Target date: 09/19/2022  Patient will be independent in self management strategies to improve quality of life and functional outcomes.   Baseline:  Goal status: MET  2.  Patient will increase  leg MMTs to 5/5 without pain to promote return to ambulation community distances with minimal deviation.    Baseline:  Goal status: IN PROGRESS  3.  Patient will increased knee mobility to -2 to 120 to promote normal navigation of steps; step over step pattern Baseline:  Goal status: IN PROGRESS  4.  Patient will improve 5 times sit to stand score from 20.42 sec to 15 sec  or less to demonstrate improved functional mobility and increased lower extremity strength.  Baseline:  09/18/22 13.7 seconds without UE use Goal status: MET  5.  Patient will increase 2 MWT to 350 ft to demonstrate improved functional mobility with community ambulation Baseline: 09/18/22 450 feet Goal status: MET  PLAN:  PT FREQUENCY: 2x/week  PT DURATION: 4 weeks  PLANNED INTERVENTIONS: Therapeutic exercises, Therapeutic activity, Neuromuscular re-education, Balance training, Gait training, Patient/Family education, Joint  manipulation, Joint mobilization, Stair training, Orthotic/Fit training, DME instructions, Aquatic Therapy, Dry Needling, Electrical stimulation, Spinal manipulation, Spinal mobilization, Cryotherapy, Moist heat, Compression bandaging, scar mobilization, Splintting, Taping, Traction, Ultrasound, Ionotophoresis '4mg'$ /ml Dexamethasone, and Manual therapy   PLAN FOR NEXT SESSION: glute strength; progress knee mobility and strength, gait training, balance, transfers; sees MD 10/19/2022   11:33 AM, 09/26/22 Mearl Latin PT, DPT Physical Therapist at [facility]

## 2022-09-26 NOTE — Telephone Encounter (Signed)
Hydrocodone-Acetaminophen 10/325 MG  Qty 30 Tablets  Take 1 tablet by mouth every 4 (four) hours as needed for up to 5 days.     PATIENT USES  [facility]

## 2022-09-28 ENCOUNTER — Ambulatory Visit (HOSPITAL_COMMUNITY): Payer: BC Managed Care – PPO

## 2022-09-28 DIAGNOSIS — M25562 Pain in left knee: Secondary | ICD-10-CM | POA: Diagnosis not present

## 2022-09-28 DIAGNOSIS — M6281 Muscle weakness (generalized): Secondary | ICD-10-CM | POA: Diagnosis not present

## 2022-09-28 DIAGNOSIS — M1712 Unilateral primary osteoarthritis, left knee: Secondary | ICD-10-CM

## 2022-09-28 DIAGNOSIS — R29898 Other symptoms and signs involving the musculoskeletal system: Secondary | ICD-10-CM | POA: Diagnosis not present

## 2022-09-28 DIAGNOSIS — R262 Difficulty in walking, not elsewhere classified: Secondary | ICD-10-CM | POA: Diagnosis not present

## 2022-09-28 NOTE — Therapy (Signed)
OUTPATIENT PHYSICAL THERAPY TREATMENT   Patient Name: Robin Sherman MRN: [id number] DOB:1960/11/10, 62 y.o., female Today's Date: 09/28/2022   END OF SESSION:  PT End of Session - 09/28/22 1031     Visit Number 7    Number of Visits 16    Date for PT Re-Evaluation 10/16/22    Authorization Type [employer] P[employer]    Authorization Time Period 60 visit limit; no auth; no copay    Authorization - Visit Number 7    Authorization - Number of Visits 60    Progress Note Due on Visit 15    PT Start Time 1031    PT Stop Time 1111    PT Time Calculation (min) 40 min    Activity Tolerance Patient tolerated treatment well    Behavior During Therapy WFL for tasks assessed/performed             Past Medical History:  Diagnosis Date   Allergy    pollen   Arthritis    left knee   Asthma    Eczema    Followed by Dr. Nevada Crane dermatology   Encounter for screening colonoscopy 02/09/2020   Headache(784.0) 09/25/2012   Hypertension    Neuromuscular disorder ([place])    Pre-diabetes    sciatica right side 10/13/2013   TRIGGER FINGER 11/03/2008   Qualifier: Diagnosis of  By: Aline Brochure MD, Dorothyann Peng     Past Surgical History:  Procedure Laterality Date   ABDOMINAL HYSTERECTOMY     bleeding   BILATERAL SALPINGECTOMY Bilateral 08/01/2013   Procedure: BILATERAL SALPINGECTOMY;  Surgeon: Jonnie Kind, MD;  Location: [facility];  Service: Gynecology;  Laterality: Bilateral;   CHONDROPLASTY Left 01/14/2014   Procedure: CHONDROPLASTY OF FEMUR;  Surgeon: Carole Civil, MD;  Location: [facility];  Service: Orthopedics;  Laterality: Left;   CHONDROPLASTY Right 12/12/2018   Procedure: CHONDROPLASTY;  Surgeon: Carole Civil, MD;  Location: [facility];  Service: Orthopedics;  Laterality: Right;   COLONOSCOPY N/A 01/27/2013   Dr. Oneida Alar: Right colon with poor prep (patient ate [place] necks the night before the procedure).  Moderate diverticulosis in the sigmoid colon, moderate hemorrhoids.   COLONOSCOPY WITH  PROPOFOL N/A 04/06/2020   Procedure: COLONOSCOPY WITH PROPOFOL;  Surgeon: Eloise Harman, DO;  [facility];  Service: Endoscopy;  Laterality: N/A;  8:15am   HEMATOMA EVACUATION N/A 08/03/2013   Procedure: EVACUATION PELVIC HEMATOMA;  Surgeon: Jonnie Kind, MD;  Location: [facility];  Service: Gynecology;  Laterality: N/A;   KNEE ARTHROSCOPY WITH MEDIAL MENISECTOMY Left 01/14/2014   Procedure: KNEE ARTHROSCOPY WITH PARTIAL MEDIAL MENISECTOMY;  Surgeon: Carole Civil, MD;  Location: [facility];  Service: Orthopedics;  Laterality: Left;   KNEE ARTHROSCOPY WITH MEDIAL MENISECTOMY Right 12/12/2018   Procedure: KNEE ARTHROSCOPY WITH MEDIAL MENISCECTOMY;  Surgeon: Carole Civil, MD;  Location: [facility];  Service: Orthopedics;  Laterality: Right;   POLYPECTOMY  04/06/2020   Procedure: POLYPECTOMY;  Surgeon: Eloise Harman, DO;  [facility];  Service: Endoscopy;;   SUPRACERVICAL ABDOMINAL HYSTERECTOMY N/A 08/01/2013   Procedure: HYSTERECTOMY SUPRACERVICAL ABDOMINAL;  Surgeon: Jonnie Kind, MD;  Location: [facility];  Service: Gynecology;  Laterality: N/A;   TOTAL KNEE ARTHROPLASTY Right 03/29/2021   Procedure: TOTAL KNEE ARTHROPLASTY;  Surgeon: Carole Civil, MD;  Location: [facility];  Service: Orthopedics;  Laterality: Right;   TOTAL KNEE ARTHROPLASTY Left 08/01/2022   Procedure: TOTAL KNEE ARTHROPLASTY;  Surgeon: Carole Civil, MD;  Location: [facility];  Service: Orthopedics;  Laterality: Left;   TRANSFORAMINAL LUMBAR INTERBODY FUSION W/ MIS 1 LEVEL Right 06/14/2020   Procedure: Right Lumbar Five- Sacral One Minimally invasive transforaminal lumbar interbody fusion;  Surgeon: Judith Part, MD;  Location: [facility];  Service: Neurosurgery;  Laterality: Right;  Right Lumbar Five- Sacral One Minimally invasive transforaminal lumbar interbody fusion   TUBAL LIGATION     [place]   Patient Active Problem List   Diagnosis Date Noted   Osteoarthritis of left knee  08/01/2022   Left knee pain 06/14/2022   S/P TKR (total knee replacement), right 03/29/2021   Isthmic spondylolisthesis 06/14/2020   Polypharmacy 02/09/2020   Encounter for screening colonoscopy 02/09/2020   DDD (degenerative disc disease), lumbar 12/08/2019   S/P right knee arthroscopy 12/12/18 *with chondroplasty patella  12/19/2018   Borderline diabetes 07/02/2015   Vitamin D deficiency 07/02/2015   Lumbar back pain 07/02/2015   Insomnia 10/29/2013   Arthritis of knee, degenerative 10/13/2013   Hand dermatitis 03/02/2012   Essential hypertension, benign 09/21/2011   Class 2 obesity 09/21/2011   Shoulder pain, right 09/21/2011   Asthma, intermittent 09/21/2011   Tobacco user 09/21/2011    PCP: Celene Squibb, MD  REFERRING PROVIDER: Carole Civil, [place]  REFERRING DIAG: [phone] (ICD-10-CM) - Unilateral primary osteoarthritis, left knee  THERAPY DIAG:  Primary osteoarthritis of left knee  Difficulty in walking, not elsewhere classified  Acute pain of left knee  Rationale for Evaluation and Treatment: Rehabilitation  ONSET DATE: 08/01/22  SUBJECTIVE:   SUBJECTIVE STATEMENT: 5/10 pain today on arrival; feeling stiff; "woke me up last night about 12:00"; had an MRI last week and going to see her back doctor later today. Patient states her job requires 12 hours shifts and wants to be "all the way right" before returning.    PERTINENT HISTORY: Right knee 2022 TKA Lumbar fusion 2021  PAIN:  Are you having pain? Yes: NPRS scale: 6/10 Pain location: knee and lower leg Pain description: sore, aching and tight Aggravating factors: walking Relieving factors: medication  PRECAUTIONS: None  WEIGHT BEARING RESTRICTIONS: No  FALLS:  Has patient fallen in last 6 months? Yes. Number of falls 1  LIVING ENVIRONMENT: Lives with: lives with their family and lives with their spouse Lives in: House/apartment Stairs: Yes: External: 2 steps; on  right going up, on left going up, and bilateral but cannot reach both Has following equipment at home: Single point cane and Walker - 2 wheeled  OCCUPATION: CNA  PLOF: Independent  PATIENT GOALS: to be able to walk  NEXT MD VISIT: 09/07/2022  OBJECTIVE:   DIAGNOSTIC FINDINGS: IMPRESSION: Status post tricompartmental knee replacement without immediate complicating feature.  CLINICAL DATA:  Left lower extremity pain and edema. History of left knee replacement. Evaluate for acute or chronic DVT.   EXAM: LEFT LOWER EXTREMITY VENOUS DOPPLER ULTRASOUND   TECHNIQUE: Gray-scale sonography with graded compression, as well as color Doppler and duplex ultrasound were performed to evaluate the lower extremity deep venous systems from the level of the common femoral vein and including the common femoral, femoral, profunda femoral, popliteal and calf veins including the posterior tibial, peroneal and gastrocnemius veins when visible. The superficial great saphenous vein was also interrogated. Spectral Doppler was utilized to evaluate flow at rest and with distal augmentation maneuvers in the common femoral, femoral and popliteal veins.   COMPARISON:  Left lower extremity venous Doppler ultrasound-10/01/2013 (negative).   FINDINGS: Contralateral Common Femoral Vein: Respiratory  phasicity is normal and symmetric with the symptomatic side. No evidence of thrombus. Normal compressibility.   Common Femoral Vein: No evidence of thrombus. Normal compressibility, respiratory phasicity and response to augmentation.   Saphenofemoral Junction: No evidence of thrombus. Normal compressibility and flow on color Doppler imaging.   Profunda Femoral Vein: No evidence of thrombus. Normal compressibility and flow on color Doppler imaging.   Femoral Vein: No evidence of thrombus. Normal compressibility, respiratory phasicity and response to augmentation.   Popliteal Vein: No evidence of thrombus.  Normal compressibility, respiratory phasicity and response to augmentation.   Calf Veins: No evidence of thrombus. Normal compressibility and flow on color Doppler imaging.   Superficial Great Saphenous Vein: No evidence of thrombus. Normal compressibility.   Other Findings:  None.   IMPRESSION: No evidence of acute or chronic DVT within the left lower extremity.     Electronically Signed   By: Sandi Mariscal M.D.   On: 08/08/2022 10:24      PATIENT SURVEYS:  FOTO 53  COGNITION: Overall cognitive status: Within functional limits for tasks assessed     SENSATION: Some into left leg and foot but is normalizing  EDEMA:  Yes normal for this time s/p    PALPATION: General soreness only  LOWER EXTREMITY ROM:  Active ROM Right eval Left eval Left 08/29/22 Left 09/14/22 Left 09/18/22 Left 09/26/22  Hip flexion        Hip extension        Hip abduction        Hip adduction        Hip internal rotation        Hip external rotation        Knee flexion  91 104 to 111 following heel slides 120 121 120  Knee extension  -10 Lacking 8 -3 Lacking 3 Lacking 3  Ankle dorsiflexion        Ankle plantarflexion        Ankle inversion        Ankle eversion         (Blank rows = not tested)  LOWER EXTREMITY MMT:  MMT Right eval Left eval Left 09/18/22  Hip flexion     Hip extension     Hip abduction     Hip adduction     Hip internal rotation     Hip external rotation     Knee flexion  Sitting 4- Sitting 5  Knee extension  4 4+  Ankle dorsiflexion  4+ 5  Ankle plantarflexion     Ankle inversion     Ankle eversion      (Blank rows = not tested)   FUNCTIONAL TESTS:  5 times sit to stand: 20.42 sec 2 minute walk test: with SPC 280 ft  GAIT: Distance walked: 280 ft Assistive device utilized: Single point cane Level of assistance: Modified independence Comments: slow gait speed; decreased gait speed  Reassessment 09/18/22 FUNCTIONAL TESTS:  5 times sit to stand:  13.70 seconds sec 2 minute walk test:  450 ft Stairs: 7 inch, alternating pattern, increased effort required with ascending on L stance  GAIT: Distance walked: 450 ft Assistive device utilized: none Level of assistance: independent Comments: antalgic on LLE, lacking TKE in stance  TODAY'S TREATMENT:  DATE:  09/28/22 Recumbent bike seat 13 x 5' full revolutions dynamic warm up  Standing: Heel/toe raises on incline x 20 Slant board 5 x 20" Hip vectors 2" hold 2 x 5 each Squats to chair target 2 x 10 6" step ups 2 x 10 4" lateral step ups 2 x 10 12" box left knee drives for flexion x 2' GTB left knee TKE's with PT holding x 20 with 2" hold     09/26/22 Recumbent bike seat 12 x 5' continuous full revolutions Hip abd/add iso 10 x 10 second holds each Prone quad stretch with manual assist 5 x 20-30 second holds Sidelying hip abduction 2 x 10 LLE  09/18/22 Recumbent bike seat 12 x 5' continuous full revolutions Reassessment  Lateral step up 4 inch 1 x 10 LLE Lateral step down 4 inch 1 x 10 LLE Step up with knee drive 6 inch 1 x 10  09/14/22 Recumbent bike seat 12 x 5' continuous full revolutions  Standing: Heel/toe raises x 20 Mini squats x 20 Slant board 5 x 20" 8" box left knee drives for flexion x '2\' 6"'$  box step up 2 x 10 4" lateral box step ups x 1'  Left knee AROM   -3-120 Supine: Left SLR x 10   09/05/22 Recumbent bike seat 13 x 3' dynamic warm up some full revolutions  Standing: Heel/toe raises 2 x 10 Slant board 5 x 10" 8" box left knee drives for flexion x 2' Hamstring stretch 10" hold x 10 4" step 2 x 10 4" lateral step ups 2 x 10 Hip abduction 2 x 10 each Mini squats x 10  Seated: LAQs 2 x 10 with 2" hold   08/29/22 Recumbent bike  5 minutes rocking for dynamic warm up and conditioning.  Heel slides 10 x10 second holds with  strap SLR 2 x 10  Sidelying hip abduction 1 x 10 LLE Step up 4 inch 2 x 10    08/22/22 physical therapy evaluation and HEP instruction    PATIENT EDUCATION:  Education details: 09/26/22: gait mechanics, HEP, TKA progression3/4/24: reassessment findings, POC, HEP; 08/29/22: HEP; EVAL: Patient educated on exam findings, POC, scope of PT, HEP, and what to expect next week. Person educated: Patient Education method: Explanation, Demonstration, and Handouts Education comprehension: verbalized understanding, returned demonstration, verbal cues required, and tactile cues required   HOME EXERCISE PROGRAM: 09/28/22 hip vectors  Access Code: [email or web address] URL: [email or web address]  09/26/22 - Prone Quadriceps Stretch with Strap  - 1 x daily - 7 x weekly - 5-10 reps - 20 second hold - Sidelying Hip Abduction (Mirrored)  - 1 x daily - 7 x weekly - 2-3 sets - 10 reps  09/14/22 Knee drives on step for flexion; ankle 4 way  Date: 08/22/2022 - Supine Ankle Pumps  - 2 x daily - 7 x weekly - 1 sets - 15 reps - Supine Quadricep Sets  - 2 x daily - 7 x weekly - 1 sets - 15 reps - Supine Active Straight Leg Raise  - 2 x daily - 7 x weekly - 1 sets - 15 reps - Supine Short Arc Quad  - 2 x daily - 7 x weekly - 1 sets - 15 reps - Supine Heel Slides  - 2 x daily - 7 x weekly - 1 sets - 15 reps - Seated Long Arc Quad  - 2 x daily - 7 x weekly - 1 sets - 10 reps - 2 sec hold -  Seated Knee Flexion Stretch  - 1 x daily - 7 x weekly - 1 sets - 15 reps - Heel Raises with Counter Support  - 1 x daily - 7 x weekly - 1 sets - 10 reps  ASSESSMENT:  CLINICAL IMPRESSION: Today's session continued to address left knee mobility and strengthening. Patient continued with subjective complaints of pain. Added hip vectors to address hip weakness.  Added to HEP.  Squats most challenging exercise for patient; needs cues to shift hips back to decrease compression on knees.  Noted swelling left knee continues.  Patient  will continue to benefit from skilled physical therapy in order to improve function and reduce impairment.     OBJECTIVE IMPAIRMENTS: Abnormal gait, decreased activity tolerance, decreased endurance, decreased mobility, difficulty walking, decreased ROM, decreased strength, hypomobility, increased edema, increased fascial restrictions, impaired perceived functional ability, increased muscle spasms, impaired flexibility, and pain.   ACTIVITY LIMITATIONS: carrying, lifting, bending, sitting, standing, squatting, sleeping, stairs, transfers, bathing, toileting, locomotion level, and caring for others  PARTICIPATION LIMITATIONS: meal prep, cleaning, laundry, driving, shopping, community activity, occupation, and yard work   [employer] POTENTIAL: Good  CLINICAL DECISION MAKING: Stable/uncomplicated  EVALUATION COMPLEXITY: Low   GOALS: Goals reviewed with patient? No  SHORT TERM GOALS: Target date: 09/05/2022 patient will be independent with initial HEP  Baseline: Goal status: MET  2.  Patient will improve left knee extension to -5 (lacking) to normalize gait pattern/ stance phase with household ambulation Baseline:  Goal status: MET  LONG TERM GOALS: Target date: 09/19/2022  Patient will be independent in self management strategies to improve quality of life and functional outcomes.   Baseline:  Goal status: MET  2.  Patient will increase  leg MMTs to 5/5 without pain to promote return to ambulation community distances with minimal deviation.    Baseline:  Goal status: IN PROGRESS  3.  Patient will increased knee mobility to -2 to 120 to promote normal navigation of steps; step over step pattern Baseline:  Goal status: IN PROGRESS  4.  Patient will improve 5 times sit to stand score from 20.42 sec to 15 sec  or less to demonstrate improved functional mobility and increased lower extremity strength.  Baseline:  09/18/22 13.7 seconds without UE use Goal status: MET  5.  Patient  will increase 2 MWT to 350 ft to demonstrate improved functional mobility with community ambulation Baseline: 09/18/22 450 feet Goal status: MET  PLAN:  PT FREQUENCY: 2x/week  PT DURATION: 4 weeks  PLANNED INTERVENTIONS: Therapeutic exercises, Therapeutic activity, Neuromuscular re-education, Balance training, Gait training, Patient/Family education, Joint manipulation, Joint mobilization, Stair training, Orthotic/Fit training, DME instructions, Aquatic Therapy, Dry Needling, Electrical stimulation, Spinal manipulation, Spinal mobilization, Cryotherapy, Moist heat, Compression bandaging, scar mobilization, Splintting, Taping, Traction, Ultrasound, Ionotophoresis '4mg'$ /ml Dexamethasone, and Manual therapy   PLAN FOR NEXT SESSION: glute strength; progress knee mobility and strength, gait training, balance, transfers; sees MD 10/19/2022   11:11 AM, 09/28/22 Shahidah Nesbitt Small Donyell Carrell MPT [facility] [place] [phone] [id number]

## 2022-10-04 DIAGNOSIS — M5416 Radiculopathy, lumbar region: Secondary | ICD-10-CM | POA: Diagnosis not present

## 2022-10-05 ENCOUNTER — Ambulatory Visit (HOSPITAL_COMMUNITY): Payer: BC Managed Care – PPO | Admitting: Physical Therapy

## 2022-10-05 DIAGNOSIS — R262 Difficulty in walking, not elsewhere classified: Secondary | ICD-10-CM | POA: Diagnosis not present

## 2022-10-05 DIAGNOSIS — M6281 Muscle weakness (generalized): Secondary | ICD-10-CM

## 2022-10-05 DIAGNOSIS — R29898 Other symptoms and signs involving the musculoskeletal system: Secondary | ICD-10-CM | POA: Diagnosis not present

## 2022-10-05 DIAGNOSIS — M1712 Unilateral primary osteoarthritis, left knee: Secondary | ICD-10-CM | POA: Diagnosis not present

## 2022-10-05 DIAGNOSIS — M25562 Pain in left knee: Secondary | ICD-10-CM

## 2022-10-05 NOTE — Therapy (Signed)
OUTPATIENT PHYSICAL THERAPY TREATMENT   Patient Name: Robin Sherman MRN: [id number] DOB:1961/03/18, 62 y.o., female Today's Date: 10/05/2022   END OF SESSION:  PT End of Session - 10/05/22 1126     Visit Number 8    Number of Visits 16    Date for PT Re-Evaluation 10/16/22    Authorization Type [employer] P[employer]    Authorization Time Period 60 visit limit; no auth; no copay    Authorization - Number of Visits 60    Progress Note Due on Visit 15    PT Start Time 1120    PT Stop Time 1200    PT Time Calculation (min) 40 min    Activity Tolerance Patient tolerated treatment well    Behavior During Therapy WFL for tasks assessed/performed             Past Medical History:  Diagnosis Date   Allergy    pollen   Arthritis    left knee   Asthma    Eczema    Followed by Dr. [place] Sherman dermatology   Encounter for screening colonoscopy 02/09/2020   Headache(784.0) 09/25/2012   Hypertension    Neuromuscular disorder ([place])    Pre-diabetes    sciatica right side 10/13/2013   TRIGGER FINGER 11/03/2008   Qualifier: Diagnosis of  By: Robin Brochure MD, Robin Sherman     Past Surgical History:  Procedure Laterality Date   ABDOMINAL HYSTERECTOMY     bleeding   BILATERAL SALPINGECTOMY Bilateral 08/01/2013   Procedure: BILATERAL SALPINGECTOMY;  Surgeon: Robin Kind, MD;  Location: [facility];  Service: Gynecology;  Laterality: Bilateral;   CHONDROPLASTY Left 01/14/2014   Procedure: CHONDROPLASTY OF FEMUR;  Surgeon: Robin Civil, MD;  Location: [facility];  Service: Orthopedics;  Laterality: Left;   CHONDROPLASTY Right 12/12/2018   Procedure: CHONDROPLASTY;  Surgeon: Robin Civil, MD;  Location: [facility];  Service: Orthopedics;  Laterality: Right;   COLONOSCOPY N/A 01/27/2013   Dr. [place] Sherman: Right colon with poor prep (patient ate [place] necks the night before the procedure).  Moderate diverticulosis in the sigmoid colon, moderate hemorrhoids.   COLONOSCOPY WITH PROPOFOL N/A 04/06/2020   Procedure:  COLONOSCOPY WITH PROPOFOL;  Surgeon: Robin Harman, DO;  [facility]: [facility];  Service: Endoscopy;  Laterality: N/A;  8:15am   HEMATOMA EVACUATION N/A 08/03/2013   Procedure: EVACUATION PELVIC HEMATOMA;  Surgeon: Robin Kind, MD;  Location: [facility];  Service: Gynecology;  Laterality: N/A;   KNEE ARTHROSCOPY WITH MEDIAL MENISECTOMY Left 01/14/2014   Procedure: KNEE ARTHROSCOPY WITH PARTIAL MEDIAL MENISECTOMY;  Surgeon: Robin Civil, MD;  Location: [facility];  Service: Orthopedics;  Laterality: Left;   KNEE ARTHROSCOPY WITH MEDIAL MENISECTOMY Right 12/12/2018   Procedure: KNEE ARTHROSCOPY WITH MEDIAL MENISCECTOMY;  Surgeon: Robin Civil, MD;  Location: [facility];  Service: Orthopedics;  Laterality: Right;   POLYPECTOMY  04/06/2020   Procedure: POLYPECTOMY;  Surgeon: Robin Harman, DO;  [facility]: [facility];  Service: Endoscopy;;   SUPRACERVICAL ABDOMINAL HYSTERECTOMY N/A 08/01/2013   Procedure: HYSTERECTOMY SUPRACERVICAL ABDOMINAL;  Surgeon: Robin Kind, MD;  Location: [facility];  Service: Gynecology;  Laterality: N/A;   TOTAL KNEE ARTHROPLASTY Right 03/29/2021   Procedure: TOTAL KNEE ARTHROPLASTY;  Surgeon: Robin Civil, MD;  Location: [facility];  Service: Orthopedics;  Laterality: Right;   TOTAL KNEE ARTHROPLASTY Left 08/01/2022   Procedure: TOTAL KNEE ARTHROPLASTY;  Surgeon: Robin Civil, MD;  Location: [facility];  Service: Orthopedics;  Laterality: Left;  TRANSFORAMINAL LUMBAR INTERBODY FUSION W/ MIS 1 LEVEL Right 06/14/2020   Procedure: Right Lumbar Five- Sacral One Minimally invasive transforaminal lumbar interbody fusion;  Surgeon: Robin Part, MD;  Location: [place];  Service: Neurosurgery;  Laterality: Right;  Right Lumbar Five- Sacral One Minimally invasive transforaminal lumbar interbody fusion   [name]   Patient Active Problem List   Diagnosis Date Noted   Osteoarthritis of left knee 08/01/2022   Left knee pain 06/14/2022    S/P TKR (total knee replacement), right 03/29/2021   Isthmic spondylolisthesis 06/14/2020   Polypharmacy 02/09/2020   Encounter for screening colonoscopy 02/09/2020   DDD (degenerative disc disease), lumbar 12/08/2019   S/P right knee arthroscopy 12/12/18 *with chondroplasty patella  12/19/2018   Borderline diabetes 07/02/2015   Vitamin D deficiency 07/02/2015   Lumbar back pain 07/02/2015   Insomnia 10/29/2013   Arthritis of knee, degenerative 10/13/2013   Hand dermatitis 03/02/2012   Essential hypertension, benign 09/21/2011   Class 2 obesity 09/21/2011   Shoulder pain, right 09/21/2011   Asthma, intermittent 09/21/2011   Tobacco user 09/21/2011    PCP: Robin Squibb, MD  REFERRING PROVIDER: Carole Sherman, [place]  REFERRING DIAG: [phone] (ICD-10-CM) - Unilateral primary osteoarthritis, left knee  THERAPY DIAG:  Primary osteoarthritis of left knee  Acute pain of left knee  Difficulty in walking, not elsewhere classified  Muscle weakness (generalized)  Other symptoms and signs involving the musculoskeletal system  Rationale for Evaluation and Treatment: Rehabilitation  ONSET DATE: 08/01/22  SUBJECTIVE:   SUBJECTIVE STATEMENT: Pt reports much better, the feeling started coming back in her knee today.  Reports she is still having soreness (touching patellar tendon) and  only having the pressure on the knee cap at 5/10 pain.  States the MRI on her back revealed something but not sure what it was; wanted to operate but she's not doing that. Still not sure about return to work.    PERTINENT HISTORY: Left knee TKA 08/01/22 Right knee 2022 TKA Lumbar fusion 2021  PAIN:  Are you having pain? Yes: NPRS scale: 6/10 Pain location: knee and lower leg Pain description: sore, aching and tight Aggravating factors: walking Relieving factors: medication  PRECAUTIONS: None  WEIGHT BEARING RESTRICTIONS: No  FALLS:  Has patient fallen in last  6 months? Yes. Number of falls 1  LIVING ENVIRONMENT: Lives with: lives with their family and lives with their spouse Lives in: House/apartment Stairs: Yes: External: 2 steps; on right going up, on left going up, and bilateral but cannot reach both Has following equipment at home: Single point cane and Walker - 2 wheeled  OCCUPATION: CNA  PLOF: Independent  PATIENT GOALS: to be able to walk  NEXT MD VISIT: 09/07/2022  OBJECTIVE:   DIAGNOSTIC FINDINGS: IMPRESSION: Status post tricompartmental knee replacement without immediate complicating feature.  CLINICAL DATA:  Left lower extremity pain and edema. History of left knee replacement. Evaluate for acute or chronic DVT.   EXAM: LEFT LOWER EXTREMITY VENOUS DOPPLER ULTRASOUND   TECHNIQUE: Gray-scale sonography with graded compression, as well as color Doppler and duplex ultrasound were performed to evaluate the lower extremity deep venous systems from the level of the common femoral vein and including the common femoral, femoral, profunda femoral, popliteal and calf veins including the posterior tibial, peroneal and gastrocnemius veins when visible. The superficial great saphenous vein was also interrogated. Spectral Doppler was utilized to evaluate flow at rest and with  distal augmentation maneuvers in the common femoral, femoral and popliteal veins.   COMPARISON:  Left lower extremity venous Doppler ultrasound-10/01/2013 (negative).   FINDINGS: Contralateral Common Femoral Vein: Respiratory phasicity is normal and symmetric with the symptomatic side. No evidence of thrombus. Normal compressibility.   Common Femoral Vein: No evidence of thrombus. Normal compressibility, respiratory phasicity and response to augmentation.   Saphenofemoral Junction: No evidence of thrombus. Normal compressibility and flow on color Doppler imaging.   Profunda Femoral Vein: No evidence of thrombus. Normal compressibility and flow on  color Doppler imaging.   Femoral Vein: No evidence of thrombus. Normal compressibility, respiratory phasicity and response to augmentation.   Popliteal Vein: No evidence of thrombus. Normal compressibility, respiratory phasicity and response to augmentation.   Calf Veins: No evidence of thrombus. Normal compressibility and flow on color Doppler imaging.   Superficial Great Saphenous Vein: No evidence of thrombus. Normal compressibility.   Other Findings:  None.   IMPRESSION: No evidence of acute or chronic DVT within the left lower extremity.     Electronically Signed   By: Sandi Mariscal M.D.   On: 08/08/2022 10:24      PATIENT SURVEYS:  FOTO 53  COGNITION: Overall cognitive status: Within functional limits for tasks assessed     SENSATION: Some into left leg and foot but is normalizing  EDEMA:  Yes normal for this time s/p    PALPATION: General soreness only  LOWER EXTREMITY ROM:  Active ROM Right eval Left eval Left 08/29/22 Left 09/14/22 Left 09/18/22 Left 09/26/22  Hip flexion        Hip extension        Hip abduction        Hip adduction        Hip internal rotation        Hip external rotation        Knee flexion  91 104 to 111 following heel slides 120 121 120  Knee extension  -10 Lacking 8 -3 Lacking 3 Lacking 3  Ankle dorsiflexion        Ankle plantarflexion        Ankle inversion        Ankle eversion         (Blank rows = not tested)  LOWER EXTREMITY MMT:  MMT Right eval Left eval Left 09/18/22  Hip flexion     Hip extension     Hip abduction     Hip adduction     Hip internal rotation     Hip external rotation     Knee flexion  Sitting 4- Sitting 5  Knee extension  4 4+  Ankle dorsiflexion  4+ 5  Ankle plantarflexion     Ankle inversion     Ankle eversion      (Blank rows = not tested)   FUNCTIONAL TESTS:  5 times sit to stand: 20.42 sec 2 minute walk test: with SPC 280 ft  GAIT: Distance walked: 280 ft Assistive device  utilized: Single point cane Level of assistance: Modified independence Comments: slow gait speed; decreased gait speed  Reassessment 09/18/22 FUNCTIONAL TESTS:  5 times sit to stand: 13.70 seconds sec 2 minute walk test:  450 ft Stairs: 7 inch, alternating pattern, increased effort required with ascending on L stance  GAIT: Distance walked: 450 ft Assistive device utilized: none Level of assistance: independent Comments: antalgic on LLE, lacking TKE in stance  TODAY'S TREATMENT:  DATE:  10/05/22 Recumbent bike seat 13 x 5' full revolutions dynamic warm up Standing: 12" box left knee drives for flexion 02R42" Heel/toe raises on incline x 20 Slant board 5 x 20" Hip vectors standing on Lt 3" holds 10X each Squats to chair target 2 x 10 7" step ups 2 x 10 7" lateral step ups 2 x 10 Black TB left knee TKE's with PT holding x 20 with 2" hold Sit to stands no UE with Lt further back 10X Manual to Lt patellar tendon/scar tissue in seated position 5 minutes  09/28/22 Recumbent bike seat 13 x 5' full revolutions dynamic warm up  Standing: Heel/toe raises on incline x 20 Slant board 5 x 20" Hip vectors 2" hold 2 x 5 each Squats to chair target 2 x 10 6" step ups 2 x 10 4" lateral step ups 2 x 10 12" box left knee drives for flexion x 2' GTB left knee TKE's with PT holding x 20 with 2" hold  09/26/22 Recumbent bike seat 12 x 5' continuous full revolutions Hip abd/add iso 10 x 10 second holds each Prone quad stretch with manual assist 5 x 20-30 second holds Sidelying hip abduction 2 x 10 LLE  09/18/22 Recumbent bike seat 12 x 5' continuous full revolutions Reassessment  Lateral step up 4 inch 1 x 10 LLE Lateral step down 4 inch 1 x 10 LLE Step up with knee drive 6 inch 1 x 10  09/14/22 Recumbent bike seat 12 x 5' continuous full  revolutions  Standing: Heel/toe raises x 20 Mini squats x 20 Slant board 5 x 20" 8" box left knee drives for flexion x 2\' 6"  box step up 2 x 10 4" lateral box step ups x 1'  Left knee AROM   -3-120 Supine: Left SLR x 10   09/05/22 Recumbent bike seat 13 x 3' dynamic warm up some full revolutions  Standing: Heel/toe raises 2 x 10 Slant board 5 x 10" 8" box left knee drives for flexion x 2' Hamstring stretch 10" hold x 10 4" step 2 x 10 4" lateral step ups 2 x 10 Hip abduction 2 x 10 each Mini squats x 10  Seated: LAQs 2 x 10 with 2" hold   08/29/22 Recumbent bike  5 minutes rocking for dynamic warm up and conditioning.  Heel slides 10 x10 second holds with strap SLR 2 x 10  Sidelying hip abduction 1 x 10 LLE Step up 4 inch 2 x 10    08/22/22 physical therapy evaluation and HEP instruction    PATIENT EDUCATION:  Education details: 09/26/22: gait mechanics, HEP, TKA progression3/4/24: reassessment findings, POC, HEP; 08/29/22: HEP; EVAL: Patient educated on exam findings, POC, scope of PT, HEP, and what to expect next week. Person educated: Patient Education method: Explanation, Demonstration, and Handouts Education comprehension: verbalized understanding, returned demonstration, verbal cues required, and tactile cues required   HOME EXERCISE PROGRAM: 09/28/22 hip vectors  Access Code: [email or web address] URL: [email or web address]  09/26/22 - Prone Quadriceps Stretch with Strap  - 1 x daily - 7 x weekly - 5-10 reps - 20 second hold - Sidelying Hip Abduction (Mirrored)  - 1 x daily - 7 x weekly - 2-3 sets - 10 reps  09/14/22 Knee drives on step for flexion; ankle 4 way  Date: 08/22/2022 - Supine Ankle Pumps  - 2 x daily - 7 x weekly - 1 sets - 15 reps - Supine Quadricep Sets  - 2 x daily - 7  x weekly - 1 sets - 15 reps - Supine Active Straight Leg Raise  - 2 x daily - 7 x weekly - 1 sets - 15 reps - Supine Short Arc Quad  - 2 x daily - 7 x weekly - 1 sets - 15  reps - Supine Heel Slides  - 2 x daily - 7 x weekly - 1 sets - 15 reps - Seated Long Arc Quad  - 2 x daily - 7 x weekly - 1 sets - 10 reps - 2 sec hold - Seated Knee Flexion Stretch  - 1 x daily - 7 x weekly - 1 sets - 15 reps - Heel Raises with Counter Support  - 1 x daily - 7 x weekly - 1 sets - 10 reps  ASSESSMENT:  CLINICAL IMPRESSION: Continues to make gains with improved strength, ROM and function.  Still with considerable amount of scar tissue, especially at most proximal and distal ends of incision.  Encouraged to continue self scar massage to this area.  Continued with activities to improve knee mobility and strength. Progressed to 7" "normal" step height, progressed stance time with vectors and increased band weight to black with TKE.  Pt able to complete all these without complaints or issues.  Noted favoring to Rt with squats so added sit to stands with Lt further back to isolate lt more.  Patient will continue to benefit from skilled physical therapy in order to improve function and reduce impairment.     OBJECTIVE IMPAIRMENTS: Abnormal gait, decreased activity tolerance, decreased endurance, decreased mobility, difficulty walking, decreased ROM, decreased strength, hypomobility, increased edema, increased fascial restrictions, impaired perceived functional ability, increased muscle spasms, impaired flexibility, and pain.   ACTIVITY LIMITATIONS: carrying, lifting, bending, sitting, standing, squatting, sleeping, stairs, transfers, bathing, toileting, locomotion level, and caring for others  PARTICIPATION LIMITATIONS: meal prep, cleaning, laundry, driving, shopping, community activity, occupation, and yard work   [employer] POTENTIAL: Good  CLINICAL DECISION MAKING: Stable/uncomplicated  EVALUATION COMPLEXITY: Low   GOALS: Goals reviewed with patient? No  SHORT TERM GOALS: Target date: 09/05/2022 patient will be independent with initial HEP  Baseline: Goal status: MET  2.   Patient will improve left knee extension to -5 (lacking) to normalize gait pattern/ stance phase with household ambulation Baseline:  Goal status: MET  LONG TERM GOALS: Target date: 09/19/2022  Patient will be independent in self management strategies to improve quality of life and functional outcomes.   Baseline:  Goal status: MET  2.  Patient will increase  leg MMTs to 5/5 without pain to promote return to ambulation community distances with minimal deviation.    Baseline:  Goal status: IN PROGRESS  3.  Patient will increased knee mobility to -2 to 120 to promote normal navigation of steps; step over step pattern Baseline:  Goal status: IN PROGRESS  4.  Patient will improve 5 times sit to stand score from 20.42 sec to 15 sec  or less to demonstrate improved functional mobility and increased lower extremity strength.  Baseline:  09/18/22 13.7 seconds without UE use Goal status: MET  5.  Patient will increase 2 MWT to 350 ft to demonstrate improved functional mobility with community ambulation Baseline: 09/18/22 450 feet Goal status: MET  PLAN:  PT FREQUENCY: 2x/week  PT DURATION: 4 weeks  PLANNED INTERVENTIONS: Therapeutic exercises, Therapeutic activity, Neuromuscular re-education, Balance training, Gait training, Patient/Family education, Joint manipulation, Joint mobilization, Stair training, Orthotic/Fit training, DME instructions, Aquatic Therapy, Dry Needling, Electrical stimulation, Spinal manipulation,  Spinal mobilization, Cryotherapy, Moist heat, Compression bandaging, scar mobilization, Splintting, Taping, Traction, Ultrasound, Ionotophoresis 4mg /ml Dexamethasone, and Manual therapy   PLAN FOR NEXT SESSION: glute strength; progress knee mobility and strength, gait training, balance, transfers; sees MD 10/19/2022   12:53 PM, 10/05/22 Teena Irani, PTA/CLT [place] Ph: [phone]

## 2022-10-06 ENCOUNTER — Other Ambulatory Visit: Payer: Self-pay | Admitting: Orthopedic Surgery

## 2022-10-11 ENCOUNTER — Encounter (HOSPITAL_COMMUNITY): Payer: BC Managed Care – PPO

## 2022-10-13 ENCOUNTER — Ambulatory Visit (HOSPITAL_COMMUNITY): Payer: BC Managed Care – PPO

## 2022-10-13 DIAGNOSIS — M25562 Pain in left knee: Secondary | ICD-10-CM | POA: Diagnosis not present

## 2022-10-13 DIAGNOSIS — R262 Difficulty in walking, not elsewhere classified: Secondary | ICD-10-CM | POA: Diagnosis not present

## 2022-10-13 DIAGNOSIS — M6281 Muscle weakness (generalized): Secondary | ICD-10-CM

## 2022-10-13 DIAGNOSIS — M1712 Unilateral primary osteoarthritis, left knee: Secondary | ICD-10-CM

## 2022-10-13 DIAGNOSIS — R29898 Other symptoms and signs involving the musculoskeletal system: Secondary | ICD-10-CM | POA: Diagnosis not present

## 2022-10-13 NOTE — Therapy (Signed)
OUTPATIENT PHYSICAL THERAPY TREATMENT/ PROGRESS NOTE  Progress Note Reporting Period 08/22/2022 to 10/13/2022  See note below for Objective Data and Assessment of Progress/Goals.       Patient Name: Robin Sherman MRN: [id number] DOB:1960/08/28, 62 y.o., female Today's Date: 10/13/2022   END OF SESSION:  PT End of Session - 10/13/22 1520     Visit Number 9    Number of Visits 16    Date for PT Re-Evaluation 10/16/22    Authorization Type [employer] P[employer]    Authorization Time Period 60 visit limit; no auth; no copay    Authorization - Visit Number 8    Authorization - Number of Visits 60    Progress Note Due on Visit 15    PT Start Time [id number]    PT Stop Time 0324    PT Time Calculation (min) 39 min              Past Medical History:  Diagnosis Date   Allergy    pollen   Arthritis    left knee   Asthma    Eczema    Followed by Dr. Nevada Crane dermatology   Encounter for screening colonoscopy 02/09/2020   Headache(784.0) 09/25/2012   Hypertension    Neuromuscular disorder ([place])    Pre-diabetes    sciatica right side 10/13/2013   TRIGGER FINGER 11/03/2008   Qualifier: Diagnosis of  By: Aline Brochure MD, Dorothyann Peng     Past Surgical History:  Procedure Laterality Date   ABDOMINAL HYSTERECTOMY     bleeding   BILATERAL SALPINGECTOMY Bilateral 08/01/2013   Procedure: BILATERAL SALPINGECTOMY;  Surgeon: Jonnie Kind, MD;  Location: [facility];  Service: Gynecology;  Laterality: Bilateral;   CHONDROPLASTY Left 01/14/2014   Procedure: CHONDROPLASTY OF FEMUR;  Surgeon: Carole Civil, MD;  Location: [facility];  Service: Orthopedics;  Laterality: Left;   CHONDROPLASTY Right 12/12/2018   Procedure: CHONDROPLASTY;  Surgeon: Carole Civil, MD;  Location: [facility];  Service: Orthopedics;  Laterality: Right;   COLONOSCOPY N/A 01/27/2013   Dr. Oneida Alar: Right colon with poor prep (patient ate [place] necks the night before the procedure).  Moderate diverticulosis in the sigmoid colon,  moderate hemorrhoids.   COLONOSCOPY WITH PROPOFOL N/A 04/06/2020   Procedure: COLONOSCOPY WITH PROPOFOL;  Surgeon: Eloise Harman, DO;  [facility];  Service: Endoscopy;  Laterality: N/A;  8:15am   HEMATOMA EVACUATION N/A 08/03/2013   Procedure: EVACUATION PELVIC HEMATOMA;  Surgeon: Jonnie Kind, MD;  Location: [facility];  Service: Gynecology;  Laterality: N/A;   KNEE ARTHROSCOPY WITH MEDIAL MENISECTOMY Left 01/14/2014   Procedure: KNEE ARTHROSCOPY WITH PARTIAL MEDIAL MENISECTOMY;  Surgeon: Carole Civil, MD;  Location: [facility];  Service: Orthopedics;  Laterality: Left;   KNEE ARTHROSCOPY WITH MEDIAL MENISECTOMY Right 12/12/2018   Procedure: KNEE ARTHROSCOPY WITH MEDIAL MENISCECTOMY;  Surgeon: Carole Civil, MD;  Location: [facility];  Service: Orthopedics;  Laterality: Right;   POLYPECTOMY  04/06/2020   Procedure: POLYPECTOMY;  Surgeon: Eloise Harman, DO;  [facility];  Service: Endoscopy;;   SUPRACERVICAL ABDOMINAL HYSTERECTOMY N/A 08/01/2013   Procedure: HYSTERECTOMY SUPRACERVICAL ABDOMINAL;  Surgeon: Jonnie Kind, MD;  Location: [facility];  Service: Gynecology;  Laterality: N/A;   TOTAL KNEE ARTHROPLASTY Right 03/29/2021   Procedure: TOTAL KNEE ARTHROPLASTY;  Surgeon: Carole Civil, MD;  Location: [facility];  Service: Orthopedics;  Laterality: Right;   TOTAL KNEE ARTHROPLASTY Left 08/01/2022   Procedure: TOTAL KNEE ARTHROPLASTY;  Surgeon: Aline Brochure,  Tim Lair, MD;  Location: [facility];  Service: Orthopedics;  Laterality: Left;   TRANSFORAMINAL LUMBAR INTERBODY FUSION W/ MIS 1 LEVEL Right 06/14/2020   Procedure: Right Lumbar Five- Sacral One Minimally invasive transforaminal lumbar interbody fusion;  Surgeon: Judith Part, MD;  Location: [facility];  Service: Neurosurgery;  Laterality: Right;  Right Lumbar Five- Sacral One Minimally invasive transforaminal lumbar interbody fusion   [place]   Patient Active Problem List   Diagnosis Date  Noted   Osteoarthritis of left knee 08/01/2022   Left knee pain 06/14/2022   S/P TKR (total knee replacement), right 03/29/2021   Isthmic spondylolisthesis 06/14/2020   Polypharmacy 02/09/2020   Encounter for screening colonoscopy 02/09/2020   DDD (degenerative disc disease), lumbar 12/08/2019   S/P right knee arthroscopy 12/12/18 *with chondroplasty patella  12/19/2018   Borderline diabetes 07/02/2015   Vitamin D deficiency 07/02/2015   Lumbar back pain 07/02/2015   Insomnia 10/29/2013   Arthritis of knee, degenerative 10/13/2013   Hand dermatitis 03/02/2012   Essential hypertension, benign 09/21/2011   Class 2 obesity 09/21/2011   Shoulder pain, right 09/21/2011   Asthma, intermittent 09/21/2011   Tobacco user 09/21/2011    PCP: Celene Squibb, MD  REFERRING PROVIDER: Carole Civil, [place]  REFERRING DIAG: [phone] (ICD-10-CM) - Unilateral primary osteoarthritis, left knee  THERAPY DIAG:  Primary osteoarthritis of left knee  Acute pain of left knee  Difficulty in walking, not elsewhere classified  Muscle weakness (generalized)  Rationale for Evaluation and Treatment: Rehabilitation  ONSET DATE: 08/01/22  SUBJECTIVE:   SUBJECTIVE STATEMENT: "Doing ok"; but I'm in pain.  States a cyst in her spine. Patient reports she is having increased pain with standing, walking; feels like her leg is going to give away.  Has to stand 12 hours at work.   PERTINENT HISTORY: Left knee TKA 08/01/22 Right knee 2022 TKA Lumbar fusion 2021  PAIN:  Are you having pain? Yes: NPRS scale: 6/10 Pain location: knee and lower leg Pain description: sore, aching and tight Aggravating factors: walking Relieving factors: medication  PRECAUTIONS: None  WEIGHT BEARING RESTRICTIONS: No  FALLS:  Has patient fallen in last 6 months? Yes. Number of falls 1  LIVING ENVIRONMENT: Lives with: lives with their family and lives with their spouse Lives in:  House/apartment Stairs: Yes: External: 2 steps; on right going up, on left going up, and bilateral but cannot reach both Has following equipment at home: Single point cane and Walker - 2 wheeled  OCCUPATION: CNA  PLOF: Independent  PATIENT GOALS: to be able to walk  NEXT MD VISIT: 09/07/2022  OBJECTIVE:   DIAGNOSTIC FINDINGS: IMPRESSION: Status post tricompartmental knee replacement without immediate complicating feature.  CLINICAL DATA:  Left lower extremity pain and edema. History of left knee replacement. Evaluate for acute or chronic DVT.   EXAM: LEFT LOWER EXTREMITY VENOUS DOPPLER ULTRASOUND   TECHNIQUE: Gray-scale sonography with graded compression, as well as color Doppler and duplex ultrasound were performed to evaluate the lower extremity deep venous systems from the level of the common femoral vein and including the common femoral, femoral, profunda femoral, popliteal and calf veins including the posterior tibial, peroneal and gastrocnemius veins when visible. The superficial great saphenous vein was also interrogated. Spectral Doppler was utilized to evaluate flow at rest and with distal augmentation maneuvers in the common femoral, femoral and popliteal veins.   COMPARISON:  Left lower extremity venous Doppler ultrasound-10/01/2013 (  negative).   FINDINGS: Contralateral Common Femoral Vein: Respiratory phasicity is normal and symmetric with the symptomatic side. No evidence of thrombus. Normal compressibility.   Common Femoral Vein: No evidence of thrombus. Normal compressibility, respiratory phasicity and response to augmentation.   Saphenofemoral Junction: No evidence of thrombus. Normal compressibility and flow on color Doppler imaging.   Profunda Femoral Vein: No evidence of thrombus. Normal compressibility and flow on color Doppler imaging.   Femoral Vein: No evidence of thrombus. Normal compressibility, respiratory phasicity and response to  augmentation.   Popliteal Vein: No evidence of thrombus. Normal compressibility, respiratory phasicity and response to augmentation.   Calf Veins: No evidence of thrombus. Normal compressibility and flow on color Doppler imaging.   Superficial Great Saphenous Vein: No evidence of thrombus. Normal compressibility.   Other Findings:  None.   IMPRESSION: No evidence of acute or chronic DVT within the left lower extremity.     Electronically Signed   By: Sandi Mariscal M.D.   On: 08/08/2022 10:24      PATIENT SURVEYS:  FOTO 53  COGNITION: Overall cognitive status: Within functional limits for tasks assessed     SENSATION: Some into left leg and foot but is normalizing  EDEMA:  Yes normal for this time s/p    PALPATION: General soreness only  LOWER EXTREMITY ROM:  Active ROM Right eval Left eval Left 08/29/22 Left 09/14/22 Left 09/18/22 Left 09/26/22 Left 10/13/22  Hip flexion         Hip extension         Hip abduction         Hip adduction         Hip internal rotation         Hip external rotation         Knee flexion  91 104 to 111 following heel slides 120 121 120 121  Knee extension  -10 Lacking 8 -3 Lacking 3 Lacking 3 Lacking 5  Ankle dorsiflexion         Ankle plantarflexion         Ankle inversion         Ankle eversion          (Blank rows = not tested)  LOWER EXTREMITY MMT:  MMT Right eval Left eval Left 09/18/22 Left 10/13/22  Hip flexion    3-  Hip extension      Hip abduction      Hip adduction      Hip internal rotation      Hip external rotation      Knee flexion  Sitting 4- Sitting 5 Sitting 5  Knee extension  4 4+ 5  Ankle dorsiflexion  4+ 5 5  Ankle plantarflexion      Ankle inversion      Ankle eversion       (Blank rows = not tested)   FUNCTIONAL TESTS:  5 times sit to stand: 20.42 sec 2 minute walk test: with SPC 280 ft  GAIT: Distance walked: 280 ft Assistive device utilized: Single point cane Level of assistance:  Modified independence Comments: slow gait speed; decreased gait speed  Reassessment 09/18/22 FUNCTIONAL TESTS:  5 times sit to stand: 13.70 seconds sec 2 minute walk test:  450 ft Stairs: 7 inch, alternating pattern, increased effort required with ascending on L stance  GAIT: Distance walked: 450 ft Assistive device utilized: none Level of assistance: independent Comments: antalgic on LLE, lacking TKE in stance  TODAY'S TREATMENT:  DATE:  10/13/22 Recumbent bike seat 13 x 5' full revolutions dynamic warm up  Progress note 5 times sit to stand 19.14 sec 2 MWT 429 ft without AD FOTO 47 MMT's  and AROM see above  Seated hamstring stretch 10" hold x 10  Standing: Heel/toe raise on incline x 20 Slant board 5 x 20" Squats 2 x 10     10/05/22 Recumbent bike seat 13 x 5' full revolutions dynamic warm up Standing: 12" box left knee drives for flexion [id number]" Heel/toe raises on incline x 20 Slant board 5 x 20" Hip vectors standing on Lt 3" holds 10X each Squats to chair target 2 x 10 7" step ups 2 x 10 7" lateral step ups 2 x 10 Black TB left knee TKE's with PT holding x 20 with 2" hold Sit to stands no UE with Lt further back 10X Manual to Lt patellar tendon/scar tissue in seated position 5 minutes  09/28/22 Recumbent bike seat 13 x 5' full revolutions dynamic warm up  Standing: Heel/toe raises on incline x 20 Slant board 5 x 20" Hip vectors 2" hold 2 x 5 each Squats to chair target 2 x 10 6" step ups 2 x 10 4" lateral step ups 2 x 10 12" box left knee drives for flexion x 2' GTB left knee TKE's with PT holding x 20 with 2" hold  09/26/22 Recumbent bike seat 12 x 5' continuous full revolutions Hip abd/add iso 10 x 10 second holds each Prone quad stretch with manual assist 5 x 20-30 second holds Sidelying hip abduction 2 x 10  LLE  09/18/22 Recumbent bike seat 12 x 5' continuous full revolutions Reassessment  Lateral step up 4 inch 1 x 10 LLE Lateral step down 4 inch 1 x 10 LLE Step up with knee drive 6 inch 1 x 10  09/14/22 Recumbent bike seat 12 x 5' continuous full revolutions  Standing: Heel/toe raises x 20 Mini squats x 20 Slant board 5 x 20" 8" box left knee drives for flexion x 2\' 6"  box step up 2 x 10 4" lateral box step ups x 1'  Left knee AROM   -3-120 Supine: Left SLR x 10   09/05/22 Recumbent bike seat 13 x 3' dynamic warm up some full revolutions  Standing: Heel/toe raises 2 x 10 Slant board 5 x 10" 8" box left knee drives for flexion x 2' Hamstring stretch 10" hold x 10 4" step 2 x 10 4" lateral step ups 2 x 10 Hip abduction 2 x 10 each Mini squats x 10  Seated: LAQs 2 x 10 with 2" hold   08/29/22 Recumbent bike  5 minutes rocking for dynamic warm up and conditioning.  Heel slides 10 x10 second holds with strap SLR 2 x 10  Sidelying hip abduction 1 x 10 LLE Step up 4 inch 2 x 10    08/22/22 physical therapy evaluation and HEP instruction    PATIENT EDUCATION:  Education details: 09/26/22: gait mechanics, HEP, TKA progression3/4/24: reassessment findings, POC, HEP; 08/29/22: HEP; EVAL: Patient educated on exam findings, POC, scope of PT, HEP, and what to expect next week. Person educated: Patient Education method: Explanation, Demonstration, and Handouts Education comprehension: verbalized understanding, returned demonstration, verbal cues required, and tactile cues required   HOME EXERCISE PROGRAM: 09/28/22 hip vectors  Access Code: [email or web address] URL: [email or web address]  09/26/22 - Prone Quadriceps Stretch with Strap  - 1 x daily - 7 x weekly - 5-10 reps - 20 second  hold - Sidelying Hip Abduction (Mirrored)  - 1 x daily - 7 x weekly - 2-3 sets - 10 reps  09/14/22 Knee drives on step for flexion; ankle 4 way  Date: 08/22/2022 - Supine Ankle Pumps  - 2 x  daily - 7 x weekly - 1 sets - 15 reps - Supine Quadricep Sets  - 2 x daily - 7 x weekly - 1 sets - 15 reps - Supine Active Straight Leg Raise  - 2 x daily - 7 x weekly - 1 sets - 15 reps - Supine Short Arc Quad  - 2 x daily - 7 x weekly - 1 sets - 15 reps - Supine Heel Slides  - 2 x daily - 7 x weekly - 1 sets - 15 reps - Seated Long Arc Quad  - 2 x daily - 7 x weekly - 1 sets - 10 reps - 2 sec hold - Seated Knee Flexion Stretch  - 1 x daily - 7 x weekly - 1 sets - 15 reps - Heel Raises with Counter Support  - 1 x daily - 7 x weekly - 1 sets - 10 reps  ASSESSMENT:  CLINICAL IMPRESSION: Progress note today.  Patient with good strength and mobility left knee but continued with antalgic gait; noted left hip flexion weakness.  Continued complaint with standing and walking.  recommend   OBJECTIVE IMPAIRMENTS: Abnormal gait, decreased activity tolerance, decreased endurance, decreased mobility, difficulty walking, decreased ROM, decreased strength, hypomobility, increased edema, increased fascial restrictions, impaired perceived functional ability, increased muscle spasms, impaired flexibility, and pain.   ACTIVITY LIMITATIONS: carrying, lifting, bending, sitting, standing, squatting, sleeping, stairs, transfers, bathing, toileting, locomotion level, and caring for others  PARTICIPATION LIMITATIONS: meal prep, cleaning, laundry, driving, shopping, community activity, occupation, and yard work   [employer] POTENTIAL: Good  CLINICAL DECISION MAKING: Stable/uncomplicated  EVALUATION COMPLEXITY: Low   GOALS: Goals reviewed with patient? No  SHORT TERM GOALS: Target date: 09/05/2022 patient will be independent with initial HEP  Baseline: Goal status: MET  2.  Patient will improve left knee extension to -5 (lacking) to normalize gait pattern/ stance phase with household ambulation Baseline:  Goal status: MET  LONG TERM GOALS: Target date: 09/19/2022  Patient will be independent in self  management strategies to improve quality of life and functional outcomes.   Baseline:  Goal status: MET  2.  Patient will increase  leg MMTs to 5/5 without pain to promote return to ambulation community distances with minimal deviation.    Baseline: see above Goal status: IN PROGRESS  3.  Patient will increased knee mobility to -2 to 120 to promote normal navigation of steps; step over step pattern Baseline:  Goal status: IN PROGRESS  4.  Patient will improve 5 times sit to stand score from 20.42 sec to 15 sec  or less to demonstrate improved functional mobility and increased lower extremity strength.  Baseline:  09/18/22 13.7 seconds without UE use Goal status: MET  5.  Patient will increase 2 MWT to 350 ft to demonstrate improved functional mobility with community ambulation Baseline: 09/18/22 450 feet Goal status: MET  PLAN:  PT FREQUENCY: 2x/week  PT DURATION: 4 weeks  PLANNED INTERVENTIONS: Therapeutic exercises, Therapeutic activity, Neuromuscular re-education, Balance training, Gait training, Patient/Family education, Joint manipulation, Joint mobilization, Stair training, Orthotic/Fit training, DME instructions, Aquatic Therapy, Dry Needling, Electrical stimulation, Spinal manipulation, Spinal mobilization, Cryotherapy, Moist heat, Compression bandaging, scar mobilization, Splintting, Taping, Traction, Ultrasound, Ionotophoresis 4mg /ml Dexamethasone, and Manual therapy  PLAN FOR NEXT SESSION: glute strength; progress knee mobility and strength, gait training, balance, transfers; sees MD 10/19/2022; will wait to see what MD recommends d/c to HEP versus continued therapy

## 2022-10-16 ENCOUNTER — Other Ambulatory Visit: Payer: Self-pay

## 2022-10-16 ENCOUNTER — Other Ambulatory Visit: Payer: Self-pay | Admitting: Orthopedic Surgery

## 2022-10-16 DIAGNOSIS — Z96652 Presence of left artificial knee joint: Secondary | ICD-10-CM

## 2022-10-16 DIAGNOSIS — M5440 Lumbago with sciatica, unspecified side: Secondary | ICD-10-CM

## 2022-10-16 MED ORDER — HYDROCODONE-ACETAMINOPHEN 5-325 MG PO TABS: 1.0000 | ORAL_TABLET | Freq: Four times a day (QID) | ORAL | 0 refills | Status: AC | PRN

## 2022-10-16 NOTE — Telephone Encounter (Signed)
Hydrocodone-Acetaminophen 10/325 MG Qty 30 Tablets     Take 1 tablet by mouth every 4 (four) hours as needed for up to 5 days.        [facility] US[facility]

## 2022-10-16 NOTE — Progress Notes (Signed)
Meds ordered this encounter  Medications   HYDROcodone-acetaminophen (NORCO/VICODIN) 5-325 MG tablet    Sig: Take 1 tablet by mouth every 6 (six) hours as needed for up to 5 days for moderate pain.    Dispense:  20 tablet    Refill:  0    Opioid reduction

## 2022-10-19 ENCOUNTER — Encounter: Payer: Self-pay | Admitting: Orthopedic Surgery

## 2022-10-19 ENCOUNTER — Ambulatory Visit (INDEPENDENT_AMBULATORY_CARE_PROVIDER_SITE_OTHER): Payer: BC Managed Care – PPO | Admitting: Orthopedic Surgery

## 2022-10-19 DIAGNOSIS — Z96652 Presence of left artificial knee joint: Secondary | ICD-10-CM

## 2022-10-19 NOTE — Progress Notes (Signed)
Chief Complaint  Patient presents with   Follow-up    Recheck on left TKA, DOS 08-01-22.   POD # 37 S/P LEFT TKA  Encounter Diagnosis  Name Primary?   S/P TKR (total knee replacement), left Yes   Robin Sherman is doing well with her left total knee she has 2 areas that are sore for her 1 is on the medial side near the muscle tendon area and one is anteriorly.  She has some numbness lateral to the incision which is normal  She is also developed a cyst on her spine which is causing her some radicular pain down her left leg  I will see her in 2 months  She is not ready to go back to work yet  Her knee is doing well.  The soreness around the knee should resolve in time.

## 2022-10-19 NOTE — Patient Instructions (Signed)
OOW X 2 MONTHS

## 2022-10-27 ENCOUNTER — Other Ambulatory Visit: Payer: Self-pay | Admitting: Orthopedic Surgery

## 2022-10-31 ENCOUNTER — Other Ambulatory Visit: Payer: Self-pay | Admitting: Orthopedic Surgery

## 2022-10-31 MED ORDER — HYDROCODONE-ACETAMINOPHEN 10-325 MG PO TABS: 1.0000 | ORAL_TABLET | Freq: Four times a day (QID) | ORAL | 0 refills | Status: DC | PRN

## 2022-10-31 NOTE — Telephone Encounter (Signed)
Dr. Mort Sawyers pt - spoke w/the patient, she is requesting a refill on Hydrocodone 10-325, 30 quantity, every 4 hours PRN to be sent to [facility].

## 2022-11-01 ENCOUNTER — Other Ambulatory Visit: Payer: Self-pay | Admitting: Orthopedic Surgery

## 2022-11-01 DIAGNOSIS — M5416 Radiculopathy, lumbar region: Secondary | ICD-10-CM | POA: Diagnosis not present

## 2022-11-15 ENCOUNTER — Other Ambulatory Visit: Payer: Self-pay | Admitting: *Deleted

## 2022-11-15 NOTE — Telephone Encounter (Signed)
Call received from patient requesting refill on pain medicine Hydrocodone 10-325mg  [facility]

## 2022-11-16 MED ORDER — HYDROCODONE-ACETAMINOPHEN 10-325 MG PO TABS: 1.0000 | ORAL_TABLET | Freq: Four times a day (QID) | ORAL | 0 refills | Status: DC | PRN

## 2022-12-04 DIAGNOSIS — G894 Chronic pain syndrome: Secondary | ICD-10-CM | POA: Diagnosis not present

## 2022-12-04 DIAGNOSIS — N390 Urinary tract infection, site not specified: Secondary | ICD-10-CM | POA: Diagnosis not present

## 2022-12-04 DIAGNOSIS — G8929 Other chronic pain: Secondary | ICD-10-CM | POA: Diagnosis not present

## 2022-12-04 DIAGNOSIS — R109 Unspecified abdominal pain: Secondary | ICD-10-CM | POA: Diagnosis not present

## 2022-12-04 DIAGNOSIS — M25562 Pain in left knee: Secondary | ICD-10-CM | POA: Diagnosis not present

## 2022-12-08 ENCOUNTER — Emergency Department (HOSPITAL_COMMUNITY)
Admission: EM | Admit: 2022-12-08 | Discharge: 2022-12-08 | Disposition: A | Discharge status: Home / Self Care | Payer: BC Managed Care – PPO | Attending: Emergency Medicine | Admitting: Emergency Medicine

## 2022-12-08 ENCOUNTER — Emergency Department (HOSPITAL_COMMUNITY): Payer: BC Managed Care – PPO

## 2022-12-08 ENCOUNTER — Other Ambulatory Visit: Payer: Self-pay

## 2022-12-08 ENCOUNTER — Encounter (HOSPITAL_COMMUNITY): Payer: Self-pay | Admitting: Emergency Medicine

## 2022-12-08 DIAGNOSIS — Z79899 Other long term (current) drug therapy: Secondary | ICD-10-CM | POA: Insufficient documentation

## 2022-12-08 DIAGNOSIS — Z96652 Presence of left artificial knee joint: Secondary | ICD-10-CM | POA: Insufficient documentation

## 2022-12-08 DIAGNOSIS — Y92009 Unspecified place in unspecified non-institutional (private) residence as the place of occurrence of the external cause: Secondary | ICD-10-CM | POA: Insufficient documentation

## 2022-12-08 DIAGNOSIS — W19XXXA Unspecified fall, initial encounter: Secondary | ICD-10-CM | POA: Insufficient documentation

## 2022-12-08 DIAGNOSIS — M25561 Pain in right knee: Secondary | ICD-10-CM | POA: Diagnosis not present

## 2022-12-08 DIAGNOSIS — Z91040 Latex allergy status: Secondary | ICD-10-CM | POA: Diagnosis not present

## 2022-12-08 DIAGNOSIS — Z471 Aftercare following joint replacement surgery: Secondary | ICD-10-CM | POA: Diagnosis not present

## 2022-12-08 DIAGNOSIS — M25511 Pain in right shoulder: Secondary | ICD-10-CM | POA: Insufficient documentation

## 2022-12-08 DIAGNOSIS — M25562 Pain in left knee: Secondary | ICD-10-CM | POA: Diagnosis not present

## 2022-12-08 DIAGNOSIS — Z7984 Long term (current) use of oral hypoglycemic drugs: Secondary | ICD-10-CM | POA: Insufficient documentation

## 2022-12-08 MED ORDER — HYDROCODONE-ACETAMINOPHEN 5-325 MG PO TABS: 1.0000 | ORAL_TABLET | ORAL | 0 refills | Status: DC | PRN

## 2022-12-08 NOTE — ED Triage Notes (Addendum)
Pt via POV c/o right shoulder and right upper chest wall pain after a mechanical fall at home on Friday May 17. Since then she has had progressively increasing pain, decreased ROM, and difficulty with ADLs. Pain currently rated 9/10 in shoulder. Pt also notes recent left TKA and pain in the joint after her fall.

## 2022-12-09 NOTE — ED Provider Notes (Signed)
[place] EMERGENCY DEPARTMENT AT [facility] Provider Note   CSN: [id number] Arrival date & time: 12/08/22  1427     History  Chief Complaint  Patient presents with   Marletta Lor    Robin Sherman is a 62 y.o. female.  Patient reports she fell 2 weeks ago and struck her right shoulder.  Patient reports she has had pain in her shoulder and difficulty moving her shoulder since the fall.  Patient also reports that she has having pain in her left knee.  Patient reports that she had a knee replacement.  Patient is concerned she could have injured something in her knee when she fell.  Patient reports no relief with over-the-counter medications.  Patient is having difficulty lifting her right arm  The history is provided by the patient. No language interpreter was used.  Fall This is a new problem. The current episode started more than 1 week ago. The problem occurs constantly. The problem has been gradually worsening. Nothing aggravates the symptoms. Nothing relieves the symptoms. She has tried nothing for the symptoms. The treatment provided no relief.       Home Medications Prior to Admission medications   Medication Sig Start Date End Date Taking? Authorizing Provider  HYDROcodone-acetaminophen (NORCO/VICODIN) 5-325 MG tablet Take 1 tablet by mouth every 4 (four) hours as needed for moderate pain. 12/08/22 12/08/23 Yes Elson Areas, PA-C  albuterol (VENTOLIN HFA) 108 (90 Base) MCG/ACT inhaler Inhale 2 puffs into the lungs every 6 (six) hours as needed. 09/20/20   Salley Scarlet, MD  Ascorbic Acid (VITAMIN C PO) Take 1 tablet by mouth daily.    [provider]  aspirin EC 325 MG tablet TAKE 1 TABLET BY MOUTH EVERY DAY WITH BREAKFAST Patient not taking: Reported on 10/19/2022 09/04/22   Vickki Hearing, MD  Cholecalciferol (VITAMIN D) 50 MCG (2000 UT) tablet Take 2,000 Units by mouth daily.    [provider]  gabapentin (NEURONTIN) 400 MG capsule TAKE 1 CAPSULE  BY MOUTH THREE TIMES A DAY. 08/10/21   Vickki Hearing, MD  hydrOXYzine (ATARAX/VISTARIL) 25 MG tablet Take 1 tablet (25 mg total) by mouth 3 (three) times daily as needed. Patient taking differently: Take 25-50 mg by mouth at bedtime as needed for anxiety. 09/20/20   Salley Scarlet, MD  lisinopril-hydrochlorothiazide (ZESTORETIC) 20-12.5 MG tablet Take 1 tablet by mouth daily. 09/20/20   Salley Scarlet, MD  metFORMIN (GLUCOPHAGE) 500 MG tablet Take 1 tablet (500 mg total) by mouth daily with breakfast. 09/20/20   Salley Scarlet, MD  methocarbamol (ROBAXIN) 500 MG tablet TAKE 1 TABLET BY MOUTH THREE TIMES A DAY 11/02/22   Vickki Hearing, MD  polyethylene glycol (MIRALAX / GLYCOLAX) 17 g packet Take 17 g by mouth daily as needed for mild constipation. 08/02/22   Vickki Hearing, MD  predniSONE (STERAPRED UNI-PAK 48 TAB) 10 MG (48) TBPK tablet Take by mouth daily. 10 mg ds 12 days Patient not taking: Reported on 10/19/2022 09/07/22   Vickki Hearing, MD      Allergies    Codeine, Acetaminophen, Latex, and Naproxen    Review of Systems   Review of Systems  Musculoskeletal:  Negative for joint swelling and myalgias.  All other systems reviewed and are negative.   Physical Exam Updated Vital Signs BP (!) 140/98   Pulse 85   Temp 98.5 F (36.9 C) (Oral)   Resp 18   Ht 5\' 4"  (1.626 m)  Wt 98 kg   LMP 04/15/2013 Comment: partial  SpO2 95%   BMI 37.08 kg/m  Physical Exam Vitals and nursing note reviewed.  Constitutional:      Appearance: She is well-developed.  HENT:     Head: Normocephalic.  Cardiovascular:     Rate and Rhythm: Normal rate.  Pulmonary:     Effort: Pulmonary effort is normal.  Abdominal:     General: There is no distension.  Musculoskeletal:        General: Swelling and tenderness present.     Cervical back: Normal range of motion.     Comments: Left knee shows well-healed incision, no erythema no swelling, good range of motion. Right shoulder  decreased range of motion pain with moving neurovascular neurosensory are intact  Skin:    General: Skin is warm.  Neurological:     General: No focal deficit present.     Mental Status: She is alert and oriented to person, place, and time.  Psychiatric:        Mood and Affect: Mood normal.     ED Results / Procedures / Treatments   Labs (all labs ordered are listed, but only abnormal results are displayed) Labs Reviewed - No data to display  EKG None  Radiology DG Shoulder Right  Result Date: 12/08/2022 CLINICAL DATA:  Right shoulder pain after fall. EXAM: RIGHT SHOULDER - 2+ VIEW COMPARISON:  June 22, 2010. FINDINGS: There is no evidence of fracture or dislocation. Minimal degenerative changes are seen involving the right acromioclavicular and glenohumeral joints. Soft tissues are unremarkable. IMPRESSION: Minimal degenerative joint disease as described above. No acute abnormality seen. Electronically Signed   By: Lupita Raider M.D.   On: 12/08/2022 16:02   DG Knee 2 Views Left  Result Date: 12/08/2022 CLINICAL DATA:  Left knee pain after fall. EXAM: LEFT KNEE - 1-2 VIEW COMPARISON:  August 01, 2022. FINDINGS: Status post left total knee arthroplasty. Femoral and tibial components are well situated. No fracture or dislocation is noted. IMPRESSION: No acute abnormality seen. Electronically Signed   By: Lupita Raider M.D.   On: 12/08/2022 16:00    Procedures Procedures    Medications Ordered in ED Medications - No data to display  ED Course/ Medical Decision Making/ A&P                             Medical Decision Making Patient complains of pain in her right shoulder after falling over a week ago  Amount and/or Complexity of Data Reviewed Radiology: ordered and independent interpretation performed. Decision-making details documented in ED Course.    Details: Stray of left knee shows knee replacement no acute findings, x-ray right shoulder shows degenerative  changes.  Risk Prescription drug management. Risk Details: Patient offered a sling for discomfort, she declined.  Patient is scheduled to see Dr. Romeo Apple for recheck of her knee.  I advised her to have him evaluate her shoulder.  Patient is given a prescription for hydrocodone.           Final Clinical Impression(s) / ED Diagnoses Final diagnoses:  Fall, initial encounter  Acute pain of right shoulder    Rx / DC Orders ED Discharge Orders          Ordered    HYDROcodone-acetaminophen (NORCO/VICODIN) 5-325 MG tablet  Every 4 hours PRN        12/08/22 2054  An After Visit Summary was printed and given to the patient.    Osie Cheeks 12/09/22 1418    Rondel Baton, MD 12/13/22 1213

## 2022-12-21 ENCOUNTER — Ambulatory Visit (INDEPENDENT_AMBULATORY_CARE_PROVIDER_SITE_OTHER): Payer: BC Managed Care – PPO | Admitting: Orthopedic Surgery

## 2022-12-21 ENCOUNTER — Encounter: Payer: Self-pay | Admitting: Orthopedic Surgery

## 2022-12-21 VITALS — Ht 64.0 in | Wt 216.0 lb

## 2022-12-21 DIAGNOSIS — M25511 Pain in right shoulder: Secondary | ICD-10-CM

## 2022-12-21 DIAGNOSIS — Z96652 Presence of left artificial knee joint: Secondary | ICD-10-CM | POA: Diagnosis not present

## 2022-12-21 DIAGNOSIS — M25562 Pain in left knee: Secondary | ICD-10-CM | POA: Diagnosis not present

## 2022-12-21 MED ORDER — HYDROCODONE-ACETAMINOPHEN 5-325 MG PO TABS: 1.0000 | ORAL_TABLET | Freq: Four times a day (QID) | ORAL | 0 refills | Status: DC | PRN

## 2022-12-21 MED ORDER — METHYLPREDNISOLONE ACETATE 40 MG/ML IJ SUSP
40.0000 mg | Freq: Once | INTRAMUSCULAR | Status: AC
  Administered 2022-12-21: 40 mg via INTRA_ARTICULAR

## 2022-12-21 NOTE — Progress Notes (Signed)
Chief Complaint  Patient presents with   Robin Sherman 12/01/22 has new pain left knee s/p replacement 08/01/22 went to the ER    Shoulder Pain    Right shoulder since fall on 12/01/22   Medication Refill    Ran out and needs it since she has had fall    FALL ~ 2 WK PRIOR TO 12/08/22 ER RECORD:  Patient reports she fell 2 weeks ago and struck her right shoulder.  Patient reports she has had pain in her shoulder and difficulty moving her shoulder since the fall.  Patient also reports that she has having pain in her left knee.  Patient reports that she had a knee replacement.  Patient is concerned she could have injured something in her knee when she fell.  Patient reports no relief with over-the-counter medications.  Patient is having difficulty lifting her right arm   The history is provided by the patient. No language interpreter was used.  Fall This is a new problem. The current episode started more than 1 week ago. The problem occurs constantly. The problem has been gradually worsening. Nothing aggravates the symptoms. Nothing relieves the symptoms. She has tried nothing for the symptoms. The treatment provided no relief.     Right shoulder active elevation is painful after 90 degrees abduction and flexion with only 110 degrees of forward elevation in the scapular plane without rotator cuff weakness  Right hand tenderness over the A1 pulley of the ring and long finger with no catching or locking  Left knee anterolateral pain stable normal range of motion  Encounter Diagnoses  Name Primary?   S/P TKR (total knee replacement), left    Acute pain of left knee Yes   Acute pain of right shoulder       Outside imaging right shoulder no fracture or dislocation but osteoarthritis right shoulder  Left knee pain status post knee replacement January 16 of this year x-ray showed no fracture or loosening of the implant  Inject right shoulder   Procedure note the subacromial injection  shoulder RIGHT    Verbal consent was obtained to inject the  RIGHT   Shoulder  Timeout was completed to confirm the injection site is a subacromial space of the  RIGHT  shoulder   Medication used Depo-Medrol 40 mg and lidocaine 1% 3 cc  Anesthesia was provided by ethyl chloride  The injection was performed in the RIGHT  posterior subacromial space. After pinning the skin with alcohol and anesthetized the skin with ethyl chloride the subacromial space was injected using a 20-gauge needle. There were no complications  Sterile dressing was applied.    Check 4 weeks   Meds ordered this encounter  Medications   HYDROcodone-acetaminophen (NORCO/VICODIN) 5-325 MG tablet    Sig: Take 1 tablet by mouth every 6 (six) hours as needed for moderate pain.    Dispense:  30 tablet    Refill:  0   methylPREDNISolone acetate (DEPO-MEDROL) injection 40 mg

## 2022-12-30 ENCOUNTER — Other Ambulatory Visit: Payer: Self-pay | Admitting: Orthopedic Surgery

## 2023-01-07 ENCOUNTER — Other Ambulatory Visit: Payer: Self-pay | Admitting: Orthopedic Surgery

## 2023-01-09 DIAGNOSIS — G894 Chronic pain syndrome: Secondary | ICD-10-CM | POA: Diagnosis not present

## 2023-01-09 DIAGNOSIS — H9213 Otorrhea, bilateral: Secondary | ICD-10-CM | POA: Diagnosis not present

## 2023-01-09 DIAGNOSIS — R519 Headache, unspecified: Secondary | ICD-10-CM | POA: Diagnosis not present

## 2023-01-09 DIAGNOSIS — H9211 Otorrhea, right ear: Secondary | ICD-10-CM | POA: Diagnosis not present

## 2023-01-12 DIAGNOSIS — Z96652 Presence of left artificial knee joint: Secondary | ICD-10-CM | POA: Insufficient documentation

## 2023-01-15 ENCOUNTER — Encounter: Payer: Self-pay | Admitting: Orthopedic Surgery

## 2023-01-15 ENCOUNTER — Ambulatory Visit (INDEPENDENT_AMBULATORY_CARE_PROVIDER_SITE_OTHER): Payer: BC Managed Care – PPO | Admitting: Orthopedic Surgery

## 2023-01-15 VITALS — BP 144/86 | HR 82 | Ht 64.0 in | Wt 223.0 lb

## 2023-01-15 DIAGNOSIS — M7052 Other bursitis of knee, left knee: Secondary | ICD-10-CM | POA: Diagnosis not present

## 2023-01-15 DIAGNOSIS — M25511 Pain in right shoulder: Secondary | ICD-10-CM

## 2023-01-15 DIAGNOSIS — Z96652 Presence of left artificial knee joint: Secondary | ICD-10-CM

## 2023-01-15 DIAGNOSIS — M25562 Pain in left knee: Secondary | ICD-10-CM

## 2023-01-15 MED ORDER — METHYLPREDNISOLONE ACETATE 40 MG/ML IJ SUSP
40.0000 mg | Freq: Once | INTRAMUSCULAR | Status: AC
  Administered 2023-01-15: 40 mg via INTRA_ARTICULAR

## 2023-01-15 NOTE — Progress Notes (Signed)
Chief Complaint  Patient presents with   Knee Pain    LEFT KNEE PAIN, AFTER FALL    ON 12/21/22 Larey Seat 12/01/22 has new pain left knee s/p replacement 08/01/22 went to the ER      Shoulder Pain      Right shoulder since fall on 12/01/22   TODAY:   Problem #1 right shoulder pain status post right shoulder subacromial injection shoulder improved  Problem #2 A1 pulley ring finger and long finger tenderness doing well  Problem #3 status post left total knee status post fall injuring left knee on 517 surgery on left total knee August 01, 2022  Patient complains of some lateral numbness lateral to the incision and some pain at the Pez anserine insertion  She has full extension and flexion of the knee without effusion she has some tenderness in the back of the knee  She is ambulating well  We decided to inject the area  Procedure note Left knee injection for bursitis   verbal consent was obtained to inject Left knee PES BURSA  Timeout was completed to confirm the site of injection  The medications used were 40 mg of Depo-Medrol and 1% lidocaine 3 cc  Anesthesia was provided by ethyl chloride and the skin was prepped with alcohol.  After cleaning the skin with alcohol a 25-gauge needle was used to inject the left knee bursa   There were no complications and a sterile bandage was applied    Encounter Diagnosis  Name Primary?   S/P TKR (total knee replacement), left 08/01/22 Yes    Return 3 months

## 2023-02-23 ENCOUNTER — Other Ambulatory Visit: Payer: Self-pay | Admitting: Orthopedic Surgery

## 2023-03-20 DIAGNOSIS — E782 Mixed hyperlipidemia: Secondary | ICD-10-CM | POA: Diagnosis not present

## 2023-03-20 DIAGNOSIS — E1165 Type 2 diabetes mellitus with hyperglycemia: Secondary | ICD-10-CM | POA: Diagnosis not present

## 2023-03-20 DIAGNOSIS — I10 Essential (primary) hypertension: Secondary | ICD-10-CM | POA: Diagnosis not present

## 2023-04-19 ENCOUNTER — Encounter: Payer: Self-pay | Admitting: Orthopedic Surgery

## 2023-04-19 ENCOUNTER — Other Ambulatory Visit (INDEPENDENT_AMBULATORY_CARE_PROVIDER_SITE_OTHER): Payer: Medicaid Other

## 2023-04-19 ENCOUNTER — Ambulatory Visit: Payer: Medicaid Other | Admitting: Orthopedic Surgery

## 2023-04-19 VITALS — BP 156/86 | HR 76 | Ht 64.0 in | Wt 233.0 lb

## 2023-04-19 DIAGNOSIS — M542 Cervicalgia: Secondary | ICD-10-CM

## 2023-04-19 DIAGNOSIS — M25511 Pain in right shoulder: Secondary | ICD-10-CM | POA: Diagnosis not present

## 2023-04-19 DIAGNOSIS — M792 Neuralgia and neuritis, unspecified: Secondary | ICD-10-CM | POA: Diagnosis not present

## 2023-04-19 DIAGNOSIS — Z96652 Presence of left artificial knee joint: Secondary | ICD-10-CM | POA: Diagnosis not present

## 2023-04-19 DIAGNOSIS — M7052 Other bursitis of knee, left knee: Secondary | ICD-10-CM | POA: Diagnosis not present

## 2023-04-19 DIAGNOSIS — R200 Anesthesia of skin: Secondary | ICD-10-CM

## 2023-04-19 DIAGNOSIS — M25562 Pain in left knee: Secondary | ICD-10-CM

## 2023-04-19 DIAGNOSIS — G8929 Other chronic pain: Secondary | ICD-10-CM

## 2023-04-19 MED ORDER — HYDROCODONE-ACETAMINOPHEN 5-325 MG PO TABS: 1.0000 | ORAL_TABLET | Freq: Four times a day (QID) | ORAL | 0 refills | Status: DC | PRN

## 2023-04-19 MED ORDER — GABAPENTIN 300 MG PO CAPS
300.0000 mg | ORAL_CAPSULE | Freq: Three times a day (TID) | ORAL | 5 refills | Status: DC
Start: 2023-04-19 — End: 2023-10-22

## 2023-04-19 MED ORDER — METHYLPREDNISOLONE ACETATE 40 MG/ML IJ SUSP
40.0000 mg | Freq: Once | INTRAMUSCULAR | Status: AC
Start: 2023-04-19 — End: 2023-04-19
  Administered 2023-04-19: 40 mg via INTRA_ARTICULAR

## 2023-04-19 NOTE — Patient Instructions (Signed)
You have been referred to [facility]surgery on [facility] in [place], they will call you with appointment. If you have not heard anything in one week call them to schedule [phone]    I will also send orders to neurology for nerve study of the left leg and you will get a call but it may be a couple months before they can get you in

## 2023-04-19 NOTE — Progress Notes (Signed)
Chief Complaint  Patient presents with   Knee Pain    Left/ feels pain at left knee worse since fall 12/01/22    Shoulder Pain    Right/ painful fell on 12/01/22    Robin Sherman has had bilateral total knee replacements.  Her left total knee was done in January.  She complains of pain in her left knee on the medial side and then in her left leg in the lateral side of the knee radiating down to the ankle.  She fell and may have 17th and has not had good pain relief in the left side since  She also complains of right shoulder pain since her fall but her pain radiates from the scapula to the right hand into the wrist area.  She denies neck pain but has a positive Spurling sign.  Her shoulder range of motion seems good her cuff Chane strong.  DG Knee AP/LAT W/Sunrise Left       Left knee pain continued pain after falling after a total knee replacement.  Right and left total knees both look to have performed normal alignment she does have a suture anchor in the medial side of the tibia x 2 she has normal appearance of the implant with propria size  No complications are seen there  Impression stable right and left total knee without complication      DG Cervical Spine 2 or 3 views       Cervical spine x-ray shows C5-C6 disc space narrowing large anterior osteophyte.  Also see a C5 anterior osteophyte with discontinuity between it and the vertebrae and then at C6-7 again anterior osteophyte disc space narrowing with slight retrolisthesis of C6 on C7.  The uncovertebral joints show arthritic changes as well.  These are mainly in the mid spine.    Impression  she has cervical spondylosis.  This could be contributing to her right arm radicular pain      Encounter Diagnoses  Name Primary?   S/P TKR (total knee replacement), left 08/01/22 Yes   Chronic knee pain after total replacement of left knee joint    Neck pain    Radicular pain of right upper extremity     Assessment and plan  C spine  spondylosis referral to Nsurgery   She has a history of lumbar disc disease and also had a right L5-S1 minimally invasive transforaminal lumbar interbody fusion with L5-S1 pedicle screw placement  The MRI done prior to that in August 2020 is included  IMPRESSION: Disc bulge L2-3 causes narrowing in the subarticular recesses which could impact either descending L3 root. There is also mild to moderate bilateral foraminal narrowing at L2-3, worse on the left.   Mild narrowing in the right subarticular recess at L3-4 due to a shallow right paracentral protrusion.   Moderate right foraminal narrowing at L5-S1 due to a combination disc and facet arthropathy.   Unclear etiology of the left leg symptoms  I am going to inject her pes bursa for the medial knee pain  I will order a nerve conduction study for her left leg pins needles numbness tingling pain which appears to be in the L5 region  Meds ordered this encounter  Medications   methylPREDNISolone acetate (DEPO-MEDROL) injection 40 mg   HYDROcodone-acetaminophen (NORCO/VICODIN) 5-325 MG tablet    Sig: Take 1 tablet by mouth every 6 (six) hours as needed for moderate pain.    Dispense:  30 tablet    Refill:  0   gabapentin (  NEURONTIN) 300 MG capsule    Sig: Take 1 capsule (300 mg total) by mouth 3 (three) times daily.    Dispense:  90 capsule    Refill:  5   Procedure injection for pes bursa left knee  Procedure note Left knee injection for bursitis   verbal consent was obtained to inject Left knee PES BURSA  Timeout was completed to confirm the site of injection  The medications used were 40 mg of Depo-Medrol and 1% lidocaine 3 cc  Anesthesia was provided by ethyl chloride and the skin was prepped with alcohol.  After cleaning the skin with alcohol a 25-gauge needle was used to inject the left knee bursa   There were no complications and a sterile bandage was applied

## 2023-04-20 ENCOUNTER — Other Ambulatory Visit (HOSPITAL_COMMUNITY): Payer: Self-pay | Admitting: Family Medicine

## 2023-04-20 DIAGNOSIS — Z1231 Encounter for screening mammogram for malignant neoplasm of breast: Secondary | ICD-10-CM

## 2023-05-03 ENCOUNTER — Inpatient Hospital Stay (HOSPITAL_COMMUNITY): Admission: RE | Admit: 2023-05-03 | Payer: Medicaid Other | Source: Ambulatory Visit

## 2023-05-04 ENCOUNTER — Other Ambulatory Visit (HOSPITAL_COMMUNITY): Payer: Self-pay | Admitting: Family Medicine

## 2023-05-04 DIAGNOSIS — Z1231 Encounter for screening mammogram for malignant neoplasm of breast: Secondary | ICD-10-CM

## 2023-05-07 ENCOUNTER — Ambulatory Visit (HOSPITAL_COMMUNITY)
Admission: RE | Admit: 2023-05-07 | Discharge: 2023-05-07 | Disposition: A | Discharge status: Home / Self Care | Payer: Medicaid Other | Source: Ambulatory Visit | Attending: Family Medicine | Admitting: Family Medicine

## 2023-05-07 ENCOUNTER — Encounter (HOSPITAL_COMMUNITY): Payer: Self-pay

## 2023-05-07 DIAGNOSIS — Z1231 Encounter for screening mammogram for malignant neoplasm of breast: Secondary | ICD-10-CM | POA: Diagnosis present

## 2023-05-17 DIAGNOSIS — M50122 Cervical disc disorder at C5-C6 level with radiculopathy: Secondary | ICD-10-CM | POA: Diagnosis not present

## 2023-05-17 DIAGNOSIS — M5416 Radiculopathy, lumbar region: Secondary | ICD-10-CM | POA: Diagnosis not present

## 2023-05-17 DIAGNOSIS — Z6838 Body mass index (BMI) 38.0-38.9, adult: Secondary | ICD-10-CM | POA: Diagnosis not present

## 2023-05-21 ENCOUNTER — Other Ambulatory Visit (HOSPITAL_COMMUNITY): Payer: Self-pay | Admitting: Neurological Surgery

## 2023-05-21 DIAGNOSIS — M1710 Unilateral primary osteoarthritis, unspecified knee: Secondary | ICD-10-CM | POA: Diagnosis not present

## 2023-05-21 DIAGNOSIS — M5090 Cervical disc disorder, unspecified, unspecified cervical region: Secondary | ICD-10-CM

## 2023-05-21 DIAGNOSIS — G890 Central pain syndrome: Secondary | ICD-10-CM | POA: Diagnosis not present

## 2023-05-21 DIAGNOSIS — G894 Chronic pain syndrome: Secondary | ICD-10-CM | POA: Diagnosis not present

## 2023-05-21 DIAGNOSIS — Z79891 Long term (current) use of opiate analgesic: Secondary | ICD-10-CM | POA: Diagnosis not present

## 2023-05-21 DIAGNOSIS — M545 Low back pain, unspecified: Secondary | ICD-10-CM | POA: Diagnosis not present

## 2023-05-21 DIAGNOSIS — M25511 Pain in right shoulder: Secondary | ICD-10-CM | POA: Diagnosis not present

## 2023-06-01 ENCOUNTER — Other Ambulatory Visit (HOSPITAL_COMMUNITY): Payer: Self-pay | Admitting: Anesthesiology

## 2023-06-01 DIAGNOSIS — M545 Low back pain, unspecified: Secondary | ICD-10-CM

## 2023-06-03 DIAGNOSIS — N3000 Acute cystitis without hematuria: Secondary | ICD-10-CM | POA: Diagnosis not present

## 2023-06-03 DIAGNOSIS — R1032 Left lower quadrant pain: Secondary | ICD-10-CM | POA: Diagnosis not present

## 2023-06-03 DIAGNOSIS — R109 Unspecified abdominal pain: Secondary | ICD-10-CM | POA: Diagnosis not present

## 2023-06-04 ENCOUNTER — Ambulatory Visit (HOSPITAL_COMMUNITY)
Admission: RE | Admit: 2023-06-04 | Discharge: 2023-06-04 | Disposition: A | Discharge status: Home / Self Care | Payer: Medicaid Other | Source: Ambulatory Visit | Attending: Internal Medicine | Admitting: Internal Medicine

## 2023-06-04 ENCOUNTER — Ambulatory Visit (HOSPITAL_COMMUNITY)
Admission: RE | Admit: 2023-06-04 | Discharge: 2023-06-04 | Disposition: A | Discharge status: Home / Self Care | Payer: Medicaid Other | Source: Ambulatory Visit | Attending: Neurological Surgery | Admitting: Neurological Surgery

## 2023-06-04 DIAGNOSIS — M5090 Cervical disc disorder, unspecified, unspecified cervical region: Secondary | ICD-10-CM | POA: Diagnosis present

## 2023-06-04 DIAGNOSIS — M545 Low back pain, unspecified: Secondary | ICD-10-CM | POA: Insufficient documentation

## 2023-06-18 DIAGNOSIS — G8929 Other chronic pain: Secondary | ICD-10-CM | POA: Diagnosis not present

## 2023-06-18 DIAGNOSIS — N3090 Cystitis, unspecified without hematuria: Secondary | ICD-10-CM | POA: Diagnosis not present

## 2023-06-18 DIAGNOSIS — R059 Cough, unspecified: Secondary | ICD-10-CM | POA: Diagnosis not present

## 2023-06-18 DIAGNOSIS — G890 Central pain syndrome: Secondary | ICD-10-CM | POA: Diagnosis not present

## 2023-06-18 DIAGNOSIS — G894 Chronic pain syndrome: Secondary | ICD-10-CM | POA: Diagnosis not present

## 2023-06-18 DIAGNOSIS — M545 Low back pain, unspecified: Secondary | ICD-10-CM | POA: Diagnosis not present

## 2023-06-18 DIAGNOSIS — M25511 Pain in right shoulder: Secondary | ICD-10-CM | POA: Diagnosis not present

## 2023-06-18 DIAGNOSIS — Z79891 Long term (current) use of opiate analgesic: Secondary | ICD-10-CM | POA: Diagnosis not present

## 2023-06-18 DIAGNOSIS — M1710 Unilateral primary osteoarthritis, unspecified knee: Secondary | ICD-10-CM | POA: Diagnosis not present

## 2023-07-16 DIAGNOSIS — M545 Low back pain, unspecified: Secondary | ICD-10-CM | POA: Diagnosis not present

## 2023-07-16 DIAGNOSIS — G890 Central pain syndrome: Secondary | ICD-10-CM | POA: Diagnosis not present

## 2023-07-16 DIAGNOSIS — M1710 Unilateral primary osteoarthritis, unspecified knee: Secondary | ICD-10-CM | POA: Diagnosis not present

## 2023-07-16 DIAGNOSIS — G894 Chronic pain syndrome: Secondary | ICD-10-CM | POA: Diagnosis not present

## 2023-07-16 DIAGNOSIS — Z79891 Long term (current) use of opiate analgesic: Secondary | ICD-10-CM | POA: Diagnosis not present

## 2023-07-16 DIAGNOSIS — M25511 Pain in right shoulder: Secondary | ICD-10-CM | POA: Diagnosis not present

## 2023-08-07 ENCOUNTER — Ambulatory Visit: Payer: Medicaid Other | Admitting: Diagnostic Neuroimaging

## 2023-08-07 ENCOUNTER — Encounter: Payer: Self-pay | Admitting: Diagnostic Neuroimaging

## 2023-08-07 VITALS — BP 138/95 | HR 100 | Ht 64.0 in | Wt 234.0 lb

## 2023-08-07 DIAGNOSIS — M79605 Pain in left leg: Secondary | ICD-10-CM

## 2023-08-07 NOTE — Progress Notes (Signed)
[facility]  PATIENT: Robin Sherman DOB: 07-30-1960  REFERRING CLINICIAN: Vickki Hearing, MD HISTORY FROM: patient REASON FOR VISIT: new consult   HISTORICAL  CHIEF COMPLAINT:  Chief Complaint  Patient presents with   New Patient (Initial Visit)    Rm 6. Alone. Req NCV/EMG for numbness in left leg.    HISTORY OF PRESENT ILLNESS:   63 year old female here for evaluation of left leg numbness.  Jan 2024 --> left knee replacement --> post-operatively noted some pain and numbness inferior laterally to the left knee.  This was felt to be expected postoperative changes.  She was advised to monitor symptoms and hopefully symptoms should improve over time.  After about 9 or 10 months symptoms persisted.  She also had pain radiating from her left ankle to left knee to left hip and lower back.  Has history of lumbar spine surgery in 2021.  Patient followed up with orthopedic surgery clinic.  Was recommended to check EMG nerve conduction study to evaluate left leg numbness.   REVIEW OF SYSTEMS: Full 14 system review of systems performed and negative with exception of: as per HPI.  ALLERGIES: Allergies  Allergen Reactions   Codeine Hives   Acetaminophen Hives and Rash    If take with other medication   Latex Rash   Naproxen Rash    Patient tolerates Ibuprofen/ poss allergy - little blisters on fingers    HOME MEDICATIONS: Outpatient Medications Prior to Visit  Medication Sig Dispense Refill   albuterol (VENTOLIN HFA) 108 (90 Base) MCG/ACT inhaler Inhale 2 puffs into the lungs every 6 (six) hours as needed. 1 each 1   Ascorbic Acid (VITAMIN C PO) Take 1 tablet by mouth daily.     Cholecalciferol (VITAMIN D) 50 MCG (2000 UT) tablet Take 2,000 Units by mouth daily.     desonide (DESOWEN) 0.05 % cream Apply topically 2 (two) times daily.     gabapentin (NEURONTIN) 300 MG capsule Take 1 capsule (300 mg total) by mouth 3 (three) times daily. (Patient taking  differently: Take 400 mg by mouth 3 (three) times daily.) 90 capsule 5   hydrOXYzine (ATARAX/VISTARIL) 25 MG tablet Take 1 tablet (25 mg total) by mouth 3 (three) times daily as needed. (Patient taking differently: Take 25-50 mg by mouth at bedtime as needed for anxiety.) 30 tablet 1   lisinopril-hydrochlorothiazide (ZESTORETIC) 20-12.5 MG tablet Take 1 tablet by mouth daily. 90 tablet 1   methocarbamol (ROBAXIN) 500 MG tablet TAKE 1 TABLET BY MOUTH THREE TIMES A DAY 90 tablet 1   aspirin EC 325 MG tablet TAKE 1 TABLET BY MOUTH EVERY DAY WITH BREAKFAST 35 tablet 0   HYDROcodone-acetaminophen (NORCO/VICODIN) 5-325 MG tablet Take 1 tablet by mouth every 6 (six) hours as needed for moderate pain. 30 tablet 0   metFORMIN (GLUCOPHAGE) 500 MG tablet Take 1 tablet (500 mg total) by mouth daily with breakfast. 90 tablet 3   polyethylene glycol (MIRALAX / GLYCOLAX) 17 g packet Take 17 g by mouth daily as needed for mild constipation. 14 each 0   No facility-administered medications prior to visit.    PAST MEDICAL HISTORY: Past Medical History:  Diagnosis Date   Allergy    pollen   Arthritis    left knee   Asthma    Eczema    Followed by Dr. Margo Aye dermatology   Encounter for screening colonoscopy 02/09/2020   Headache(784.0) 09/25/2012   Hypertension    Neuromuscular disorder (HCC)    Pre-diabetes  sciatica right side 10/13/2013   TRIGGER FINGER 11/03/2008   Qualifier: Diagnosis of  By: Romeo Apple MD, Duffy Rhody      PAST SURGICAL HISTORY: Past Surgical History:  Procedure Laterality Date   ABDOMINAL HYSTERECTOMY     bleeding   BILATERAL SALPINGECTOMY Bilateral 08/01/2013   Procedure: BILATERAL SALPINGECTOMY;  Surgeon: Tilda Burrow, MD;  Location: [facility];  Service: Gynecology;  Laterality: Bilateral;   BREAST BIOPSY Left 06/29/2015   FIBROADENOMA   CHONDROPLASTY Left 01/14/2014   Procedure: CHONDROPLASTY OF FEMUR;  Surgeon: Vickki Hearing, MD;  Location: [facility];  Service:  Orthopedics;  Laterality: Left;   CHONDROPLASTY Right 12/12/2018   Procedure: CHONDROPLASTY;  Surgeon: Vickki Hearing, MD;  Location: [facility];  Service: Orthopedics;  Laterality: Right;   COLONOSCOPY N/A 01/27/2013   Dr. Darrick Penna: Right colon with poor prep (patient ate [place] necks the night before the procedure).  Moderate diverticulosis in the sigmoid colon, moderate hemorrhoids.   COLONOSCOPY WITH PROPOFOL N/A 04/06/2020   Procedure: COLONOSCOPY WITH PROPOFOL;  Surgeon: Lanelle Bal, DO;  Location: [facility];  Service: Endoscopy;  Laterality: N/A;  8:15am   HEMATOMA EVACUATION N/A 08/03/2013   Procedure: EVACUATION PELVIC HEMATOMA;  Surgeon: Tilda Burrow, MD;  Location: [facility];  Service: Gynecology;  Laterality: N/A;   KNEE ARTHROSCOPY WITH MEDIAL MENISECTOMY Left 01/14/2014   Procedure: KNEE ARTHROSCOPY WITH PARTIAL MEDIAL MENISECTOMY;  Surgeon: Vickki Hearing, MD;  Location: [facility];  Service: Orthopedics;  Laterality: Left;   KNEE ARTHROSCOPY WITH MEDIAL MENISECTOMY Right 12/12/2018   Procedure: KNEE ARTHROSCOPY WITH MEDIAL MENISCECTOMY;  Surgeon: Vickki Hearing, MD;  Location: [facility];  Service: Orthopedics;  Laterality: Right;   POLYPECTOMY  04/06/2020   Procedure: POLYPECTOMY;  Surgeon: Lanelle Bal, DO;  [facility]: [facility];  Service: Endoscopy;;   SUPRACERVICAL ABDOMINAL HYSTERECTOMY N/A 08/01/2013   Procedure: HYSTERECTOMY SUPRACERVICAL ABDOMINAL;  Surgeon: Tilda Burrow, MD;  Location: [facility];  Service: Gynecology;  Laterality: N/A;   TOTAL KNEE ARTHROPLASTY Right 03/29/2021   Procedure: TOTAL KNEE ARTHROPLASTY;  Surgeon: Vickki Hearing, MD;  Location: [facility];  Service: Orthopedics;  Laterality: Right;   TOTAL KNEE ARTHROPLASTY Left 08/01/2022   Procedure: TOTAL KNEE ARTHROPLASTY;  Surgeon: Vickki Hearing, MD;  Location: [facility];  Service: Orthopedics;  Laterality: Left;   TRANSFORAMINAL LUMBAR INTERBODY FUSION W/ MIS 1 LEVEL Right  06/14/2020   Procedure: Right Lumbar Five- Sacral One Minimally invasive transforaminal lumbar interbody fusion;  Surgeon: Jadene Pierini, MD;  Location: [facility];  Service: Neurosurgery;  Laterality: Right;  Right Lumbar Five- Sacral One Minimally invasive transforaminal lumbar interbody fusion   TUBAL LIGATION     [facility]    FAMILY HISTORY: Family History  Problem Relation Age of Onset   Heart disease Mother        MI   Asthma Mother    Hypertension Mother    Stroke Father    Heart disease Father        MI   Hypertension Father    Cancer Sister        breast   Colon cancer Neg Hx     SOCIAL HISTORY: Social History   Socioeconomic History   Marital status: Married    Spouse name: Leonette Most   Number of children: 1   Years of education: 14   Highest education level: Not on file  Occupational History   Occupation: CNA  Tobacco Use   Smoking  status: Every Day    Current packs/day: 0.50    Average packs/day: 0.5 packs/day for 44.1 years (22.0 ttl pk-yrs)    Types: Cigarettes    Start date: 07/18/1979   Smokeless tobacco: Never   Tobacco comments:    wants to quit  Vaping Use   Vaping status: Never Used  Substance and Sexual Activity   Alcohol use: Yes    Alcohol/week: 0.0 standard drinks of alcohol    Comment: occ   Drug use: No   Sexual activity: Yes    Birth control/protection: Surgical  Other Topics Concern   Not on file  Social History Narrative   Lives at home with Leonette Most   Visit with family and grandchildren   Social Drivers of Corporate investment banker Strain: Not on file  Food Insecurity: No Food Insecurity (08/01/2022)   Hunger Vital Sign    Worried About Running Out of Food in the Last Year: Never true    Ran Out of Food in the Last Year: Never true  Transportation Needs: No Transportation Needs (08/01/2022)   PRAPARE - Administrator, Civil Service (Medical): No    Lack of Transportation (Non-Medical): No  Physical Activity:  Not on file  Stress: Not on file  Social Connections: Not on file  Intimate Partner Violence: Not At Risk (08/01/2022)   Humiliation, Afraid, Rape, and Kick questionnaire    Fear of Current or Ex-Partner: No    Emotionally Abused: No    Physically Abused: No    Sexually Abused: No     PHYSICAL EXAM  GENERAL EXAM/CONSTITUTIONAL: Vitals:  Vitals:   08/07/23 1138  BP: (!) 138/95  Pulse: 100  Weight: 234 lb (106.1 kg)  Height: 5\' 4"  (1.626 m)   Body mass index is 40.17 kg/m. Wt Readings from Last 3 Encounters:  08/07/23 234 lb (106.1 kg)  04/19/23 233 lb (105.7 kg)  01/15/23 223 lb (101.2 kg)   Patient is in no distress; well developed, nourished and groomed; neck is supple  CARDIOVASCULAR: Examination of carotid arteries is normal; no carotid bruits Regular rate and rhythm, no murmurs Examination of peripheral vascular system by observation and palpation is normal  EYES: Ophthalmoscopic exam of optic discs and posterior segments is normal; no papilledema or hemorrhages No results found.  MUSCULOSKELETAL: Gait, strength, tone, movements noted in Neurologic exam below  NEUROLOGIC: MENTAL STATUS:      No data to display         awake, alert, oriented to person, place and time recent and remote memory intact normal attention and concentration language fluent, comprehension intact, naming intact fund of knowledge appropriate  CRANIAL NERVE:  2nd - no papilledema on fundoscopic exam 2nd, 3rd, 4th, 6th - pupils equal and reactive to light, visual fields full to confrontation, extraocular muscles intact, no nystagmus 5th - facial sensation symmetric 7th - facial strength symmetric 8th - hearing intact 9th - palate elevates symmetrically, uvula midline 11th - shoulder shrug symmetric 12th - tongue protrusion midline  MOTOR:  normal bulk and tone, full strength in the BUE, BLE  SENSORY:  normal and symmetric to light touch, temperature, vibration; MILD DECR  SENSATION BELOW LEFT KNEE ANTERIOR / LATERALLY (LATERAL SURAL CUTANEOUS NERVE DISTRIBUTION)  COORDINATION:  finger-nose-finger, fine finger movements normal  REFLEXES:  deep tendon reflexes trace and symmetric  GAIT/STATION:  narrow based gait     DIAGNOSTIC DATA (LABS, IMAGING, TESTING) - I reviewed patient records, labs, notes, testing and imaging myself  where available.  Lab Results  Component Value Date   WBC 18.6 (H) 08/02/2022   HGB 11.3 (L) 08/02/2022   HCT 36.0 08/02/2022   MCV 89.8 08/02/2022   PLT 364 08/02/2022      Component Value Date/Time   NA 140 08/02/2022 0320   K 4.4 08/02/2022 0320   CL 105 08/02/2022 0320   CO2 28 08/02/2022 0320   GLUCOSE 125 (H) 08/02/2022 0320   BUN 8 08/02/2022 0320   CREATININE 0.56 08/02/2022 0320   CREATININE 0.71 09/20/2020 1202   CALCIUM 8.4 (L) 08/02/2022 0320   PROT 7.7 09/20/2020 1202   ALBUMIN 4.4 03/12/2017 1134   AST 25 09/20/2020 1202   ALT 29 09/20/2020 1202   ALKPHOS 124 03/12/2017 1134   BILITOT 0.2 09/20/2020 1202   GFRNONAA >60 08/02/2022 0320   GFRNONAA >89 03/12/2017 1134   GFRAA >60 12/11/2018 0925   GFRAA >89 03/12/2017 1134   Lab Results  Component Value Date   CHOL 188 09/20/2020   HDL 54 09/20/2020   LDLCALC 112 (H) 09/20/2020   TRIG 112 09/20/2020   CHOLHDL 3.5 09/20/2020   Lab Results  Component Value Date   HGBA1C 6.0 (H) 07/27/2022   No results found for: "VITAMINB12" Lab Results  Component Value Date   TSH 1.779 04/15/2013    06/04/23 MRI lumbar spine 1. Lumbar spondylosis and degenerative disc disease, causing moderate impingement at L4-5; mild to moderate impingement at L2-3 and L3-4; and mild impingement at L5-S1. The impingement at L4-5 is minimally worse compared to previous. 2. L5-S1 fusion with posterior laminectomies. Mild right foraminal stenosis at L5-S1. 3. Congenitally short pedicles in the lumbar spine.    ASSESSMENT AND PLAN  63 y.o. year old female here  with:   Dx:  1. Left leg pain      PLAN:  LEFT LEG PAIN (mainly noted post-operative after left knee replacement in Jan 2024; also with lumbar radiculopathies and history of low back surgery in ~2021) - numbness and pain in left lateral sural cutaneous nerve distribution, noted post-operatively; also with history of lumbar radiculopathies and pre-diabetes which can be contributory - EMG/NCS not likely to elucidate cause of symptoms, which are likely multi-factorial - recommend symptom control with pain mgmt; follow up with orthopedic and neurosurgery clinics  Return for return to referring provider.    Suanne Marker, MD 08/07/2023, 12:19 PM Certified in Neurology, Neurophysiology and Neuroimaging  [facility] [address] [place], [place] [address] [phone]

## 2023-08-07 NOTE — Patient Instructions (Addendum)
LEFT LEG PAIN (mainly noted post-operative after left knee replacement in Jan 2024; also with lumbar radiculopathies and history of low back surgery in ~2021)  - numbness and pain in left lateral sural cutaneous nerve distribution, noted post-operatively; also with history of lumbar radiculopathies and pre-diabetes which can be contributory  - EMG/NCS not likely to help figure out the cause of symptoms, which are likely multi-factorial  - recommend symptom control with pain mgmt; follow up with orthopedic and neurosurgery clinics

## 2023-08-15 DIAGNOSIS — M5416 Radiculopathy, lumbar region: Secondary | ICD-10-CM | POA: Diagnosis not present

## 2023-08-15 DIAGNOSIS — M50122 Cervical disc disorder at C5-C6 level with radiculopathy: Secondary | ICD-10-CM | POA: Diagnosis not present

## 2023-08-28 DIAGNOSIS — G894 Chronic pain syndrome: Secondary | ICD-10-CM | POA: Diagnosis not present

## 2023-08-28 DIAGNOSIS — M5416 Radiculopathy, lumbar region: Secondary | ICD-10-CM | POA: Diagnosis not present

## 2023-08-28 DIAGNOSIS — Z79891 Long term (current) use of opiate analgesic: Secondary | ICD-10-CM | POA: Diagnosis not present

## 2023-08-28 DIAGNOSIS — M5451 Vertebrogenic low back pain: Secondary | ICD-10-CM | POA: Diagnosis not present

## 2023-09-03 DIAGNOSIS — M5416 Radiculopathy, lumbar region: Secondary | ICD-10-CM | POA: Diagnosis not present

## 2023-09-21 DIAGNOSIS — G5603 Carpal tunnel syndrome, bilateral upper limbs: Secondary | ICD-10-CM | POA: Diagnosis not present

## 2023-09-21 DIAGNOSIS — R202 Paresthesia of skin: Secondary | ICD-10-CM | POA: Diagnosis not present

## 2023-09-25 DIAGNOSIS — Z79891 Long term (current) use of opiate analgesic: Secondary | ICD-10-CM | POA: Diagnosis not present

## 2023-09-25 DIAGNOSIS — M5451 Vertebrogenic low back pain: Secondary | ICD-10-CM | POA: Diagnosis not present

## 2023-09-25 DIAGNOSIS — M5416 Radiculopathy, lumbar region: Secondary | ICD-10-CM | POA: Diagnosis not present

## 2023-09-25 DIAGNOSIS — G894 Chronic pain syndrome: Secondary | ICD-10-CM | POA: Diagnosis not present

## 2023-10-05 DIAGNOSIS — N644 Mastodynia: Secondary | ICD-10-CM | POA: Diagnosis not present

## 2023-10-10 DIAGNOSIS — M5416 Radiculopathy, lumbar region: Secondary | ICD-10-CM | POA: Diagnosis not present

## 2023-10-16 DIAGNOSIS — E1165 Type 2 diabetes mellitus with hyperglycemia: Secondary | ICD-10-CM | POA: Diagnosis not present

## 2023-10-16 DIAGNOSIS — I10 Essential (primary) hypertension: Secondary | ICD-10-CM | POA: Diagnosis not present

## 2023-10-22 ENCOUNTER — Ambulatory Visit (INDEPENDENT_AMBULATORY_CARE_PROVIDER_SITE_OTHER): Admitting: Orthopedic Surgery

## 2023-10-22 ENCOUNTER — Encounter: Payer: Self-pay | Admitting: Orthopedic Surgery

## 2023-10-22 ENCOUNTER — Other Ambulatory Visit (HOSPITAL_COMMUNITY): Payer: Self-pay | Admitting: Nurse Practitioner

## 2023-10-22 VITALS — BP 141/91 | HR 93 | Ht 64.0 in | Wt 234.0 lb

## 2023-10-22 DIAGNOSIS — R202 Paresthesia of skin: Secondary | ICD-10-CM

## 2023-10-22 DIAGNOSIS — M792 Neuralgia and neuritis, unspecified: Secondary | ICD-10-CM | POA: Diagnosis not present

## 2023-10-22 DIAGNOSIS — N644 Mastodynia: Secondary | ICD-10-CM

## 2023-10-22 DIAGNOSIS — G5603 Carpal tunnel syndrome, bilateral upper limbs: Secondary | ICD-10-CM

## 2023-10-22 MED ORDER — GABAPENTIN 100 MG PO CAPS: 100.0000 mg | ORAL_CAPSULE | Freq: Three times a day (TID) | ORAL | 2 refills | Status: AC

## 2023-10-22 NOTE — Patient Instructions (Signed)
We are referring you to [facility] from [facility] Office address is [address] [place] The phone number is [phone]  The office will call you with an appointment Dr. Newton will do the nerve study   

## 2023-10-22 NOTE — Progress Notes (Signed)
 Chief Complaint  Patient presents with   Hand Problem    Both hands are blue in the morning and bilateral middle fingers get sore/ stuck    This 64 year old female presents with bilateral hand pain numbness and tingling.  On the right side it radiates to the shoulder on the left side it just involves the hand she feels like they get numb it is worse at night she wakes up she cannot close her hand she feels pressure  She occasionally also notes discoloration of the hand  Exam is unremarkable She has full range of motion no tenderness Negative carpal tunnel compression test No sensory abnormalities Normal radial and ulnar pulses bilaterally  Provocative test for carpal tunnel negative  Unclear etiology  Treat for carpal tunnel syndrome  Test carpal tunnel  Meds ordered this encounter  Medications   gabapentin (NEURONTIN) 100 MG capsule    Sig: Take 1 capsule (100 mg total) by mouth 3 (three) times daily.    Dispense:  90 capsule    Refill:  2   Encounter Diagnoses  Name Primary?   Paresthesia of both hands    Bilateral carpal tunnel syndrome Yes   Radicular pain of right upper extremity      Current Outpatient Medications:    albuterol (VENTOLIN HFA) 108 (90 Base) MCG/ACT inhaler, Inhale 2 puffs into the lungs every 6 (six) hours as needed., Disp: 1 each, Rfl: 1   Ascorbic Acid (VITAMIN C PO), Take 1 tablet by mouth daily., Disp: , Rfl:    Cholecalciferol (VITAMIN D) 50 MCG (2000 UT) tablet, Take 2,000 Units by mouth daily., Disp: , Rfl:    desonide (DESOWEN) 0.05 % cream, Apply topically 2 (two) times daily., Disp: , Rfl:    gabapentin (NEURONTIN) 300 MG capsule, Take 1 capsule (300 mg total) by mouth 3 (three) times daily. (Patient taking differently: Take 400 mg by mouth 3 (three) times daily.), Disp: 90 capsule, Rfl: 5   hydrOXYzine (ATARAX/VISTARIL) 25 MG tablet, Take 1 tablet (25 mg total) by mouth 3 (three) times daily as needed. (Patient taking differently: Take  25-50 mg by mouth at bedtime as needed for anxiety.), Disp: 30 tablet, Rfl: 1   lisinopril-hydrochlorothiazide (ZESTORETIC) 20-12.5 MG tablet, Take 1 tablet by mouth daily., Disp: 90 tablet, Rfl: 1   methocarbamol (ROBAXIN) 500 MG tablet, TAKE 1 TABLET BY MOUTH THREE TIMES A DAY, Disp: 90 tablet, Rfl: 1

## 2023-10-22 NOTE — Progress Notes (Signed)
  Intake history:  LMP 04/15/2013 Comment: partial There is no height or weight on file to calculate BMI.    WHAT ARE WE SEEING YOU FOR TODAY?   bilateral (2nd) finger(s)  How long has this bothered you? (DOI?DOS?WS?)  on long duration wakes up pain and blue in hands and fingers sore painful   Anticoag.  No  Diabetes No  Heart disease No  Hypertension Yes  SMOKING HX No  Kidney disease No  Any ALLERGIES ______________________________________________   Treatment:  Have you taken:  Tylenol No  Advil No  Had PT No  Had injection No  Other  _________________________   Marland Kitchen

## 2023-10-23 DIAGNOSIS — M5416 Radiculopathy, lumbar region: Secondary | ICD-10-CM | POA: Diagnosis not present

## 2023-10-23 DIAGNOSIS — M5451 Vertebrogenic low back pain: Secondary | ICD-10-CM | POA: Diagnosis not present

## 2023-10-23 DIAGNOSIS — G894 Chronic pain syndrome: Secondary | ICD-10-CM | POA: Diagnosis not present

## 2023-10-23 DIAGNOSIS — Z79891 Long term (current) use of opiate analgesic: Secondary | ICD-10-CM | POA: Diagnosis not present

## 2023-11-09 ENCOUNTER — Ambulatory Visit: Admitting: Physical Medicine and Rehabilitation

## 2023-11-09 DIAGNOSIS — M542 Cervicalgia: Secondary | ICD-10-CM

## 2023-11-09 DIAGNOSIS — M79642 Pain in left hand: Secondary | ICD-10-CM

## 2023-11-09 DIAGNOSIS — R202 Paresthesia of skin: Secondary | ICD-10-CM

## 2023-11-09 DIAGNOSIS — M79641 Pain in right hand: Secondary | ICD-10-CM | POA: Diagnosis not present

## 2023-11-09 DIAGNOSIS — R531 Weakness: Secondary | ICD-10-CM

## 2023-11-09 NOTE — Progress Notes (Signed)
 Patient says that she is having pain in the shoulders and into the hands that is worse in the mornings. She says that she will wake up and her hands will be painful, numb, and "stuck."

## 2023-11-12 NOTE — Procedures (Unsigned)
 EMG & NCV Findings: Evaluation of the left median motor and the right median motor nerves showed reduced amplitude (L4.3, R4.8 mV).  The left median (across palm) sensory nerve showed no response (Palm).  The right median (across palm) sensory nerve showed prolonged distal peak latency (Wrist, 4.1 ms) and prolonged distal peak latency (Palm, 7.3 ms).  All remaining nerves (as indicated in the following tables) were within normal limits.  Left vs. Right side comparison data for the median motor nerve indicates abnormal L-R latency difference (0.9 ms).  The ulnar sensory nerve indicates abnormal L-R amplitude difference (65.4 %).  All remaining left vs. right side differences were within normal limits.    All examined muscles (as indicated in the following table) showed no evidence of electrical instability.    Impression: The above electrodiagnostic study is ABNORMAL and reveals evidence of a mild right median nerve entrapment at the wrist (carpal tunnel syndrome) affecting sensory components. There is no significant electrodiagnostic evidence of any other focal nerve entrapment, brachial plexopathy or cervical radiculopathy.   Recommendations: 1.  Follow-up with referring physician. 2.  Continue current management of symptoms. 3.  Continue use of resting splint at night-time and as needed during the day. 4.  Suggest surgical evaluation.  ___________________________ Collin Deal FAAPMR Board Certified, American Board of Physical Medicine and Rehabilitation    Nerve Conduction Studies Anti Sensory Summary Table   Stim Site NR Peak (ms) Norm Peak (ms) P-T Amp (V) Norm P-T Amp Site1 Site2 Delta-P (ms) Dist (cm) Vel (m/s) Norm Vel (m/s)  Left Median Acr Palm Anti Sensory (2nd Digit)  30.2C  Wrist    3.3 <3.6 23.1 >10 Wrist Palm  0.0    Palm *NR  <2.0          Right Median Acr Palm Anti Sensory (2nd Digit)  30.7C  Wrist    *4.1 <3.6 10.2 >10 Wrist Palm 3.2 0.0    Palm    *7.3 <2.0 7.2          Left Radial Anti Sensory (Base 1st Digit)  30.8C  Wrist    2.4 <3.1 25.6  Wrist Base 1st Digit 2.4 0.0    Right Radial Anti Sensory (Base 1st Digit)  30.7C  Wrist    2.5 <3.1 27.4  Wrist Base 1st Digit 2.5 0.0    Left Ulnar Anti Sensory (5th Digit)  31C  Wrist    3.3 <3.7 61.0 >15.0 Wrist 5th Digit 3.3 14.0 42 >38  Right Ulnar Anti Sensory (5th Digit)  31.2C  Wrist    3.3 <3.7 21.1 >15.0 Wrist 5th Digit 3.3 14.0 42 >38   Motor Summary Table   Stim Site NR Onset (ms) Norm Onset (ms) O-P Amp (mV) Norm O-P Amp Site1 Site2 Delta-0 (ms) Dist (cm) Vel (m/s) Norm Vel (m/s)  Left Median Motor (Abd Poll Brev)  31.2C  Wrist    3.2 <4.2 *4.3 >5 Elbow Wrist 4.2 21.0 50 >50  Elbow    7.4  7.1         Right Median Motor (Abd Poll Brev)  30.8C  Wrist    4.1 <4.2 *4.8 >5 Elbow Wrist 4.2 21.0 50 >50  Elbow    8.3  4.8         Left Ulnar Motor (Abd Dig Min)  31.8C  Wrist    3.0 <4.2 9.5 >3 B Elbow Wrist 3.5 19.0 54 >53  B Elbow    6.5  8.9  A Elbow B Elbow  1.3 10.0 77 >53  A Elbow    7.8  8.1         Right Ulnar Motor (Abd Dig Min)  30.3C  Wrist    2.9 <4.2 9.5 >3 B Elbow Wrist 3.6 19.5 54 >53  B Elbow    6.5  9.1  A Elbow B Elbow 1.2 11.0 92 >53  A Elbow    7.7  8.6          EMG   Side Muscle Nerve Root Ins Act Fibs Psw Amp Dur Poly Recrt Int Deatra Face Comment  Right Abd Poll Brev Median C8-T1 Nml Nml Nml Nml Nml 0 Nml Nml   Right 1stDorInt Ulnar C8-T1 Nml Nml Nml Nml Nml 0 Nml Nml   Right PronatorTeres Median C6-7 Nml Nml Nml Nml Nml 0 Nml Nml   Right Biceps Musculocut C5-6 Nml Nml Nml Nml Nml 0 Nml Nml   Right Deltoid Axillary C5-6 Nml Nml Nml Nml Nml 0 Nml Nml     Nerve Conduction Studies Anti Sensory Left/Right Comparison   Stim Site L Lat (ms) R Lat (ms) L-R Lat (ms) L Amp (V) R Amp (V) L-R Amp (%) Site1 Site2 L Vel (m/s) R Vel (m/s) L-R Vel (m/s)  Median Acr Palm Anti Sensory (2nd Digit)  30.2C  Wrist 3.3 *4.1 0.8 23.1 10.2 55.8 Wrist Palm     Palm  *7.3   7.2        Radial  Anti Sensory (Base 1st Digit)  30.8C  Wrist 2.4 2.5 0.1 25.6 27.4 6.6 Wrist Base 1st Digit     Ulnar Anti Sensory (5th Digit)  31C  Wrist 3.3 3.3 0.0 61.0 21.1 *65.4 Wrist 5th Digit 42 42 0   Motor Left/Right Comparison   Stim Site L Lat (ms) R Lat (ms) L-R Lat (ms) L Amp (mV) R Amp (mV) L-R Amp (%) Site1 Site2 L Vel (m/s) R Vel (m/s) L-R Vel (m/s)  Median Motor (Abd Poll Brev)  31.2C  Wrist 3.2 4.1 *0.9 *4.3 *4.8 10.4 Elbow Wrist 50 50 0  Elbow 7.4 8.3 0.9 7.1 4.8 32.4       Ulnar Motor (Abd Dig Min)  31.8C  Wrist 3.0 2.9 0.1 9.5 9.5 0.0 B Elbow Wrist 54 54 0  B Elbow 6.5 6.5 0.0 8.9 9.1 2.2 A Elbow B Elbow 77 92 15  A Elbow 7.8 7.7 0.1 8.1 8.6 5.8          Waveforms:

## 2023-11-14 ENCOUNTER — Encounter: Payer: Self-pay | Admitting: Physical Medicine and Rehabilitation

## 2023-11-14 NOTE — Progress Notes (Signed)
 Robin Sherman - 63 y.o. female MRN [id number]  Date of birth: 1960-12-13  Office Visit Note: Visit Date: 11/09/2023 PCP: Omie Bickers, MD Referred by: Darrin Emerald, MD  Subjective: Chief Complaint  Patient presents with   Bilateral upper extremity NCV with EMG   HPI: Robin Sherman is a 63 y.o. female who comes in today at the request of Dr. Elsa Halls for evaluation and management of chronic, worsening and severe pain, numbness and tingling in the Bilateral upper extremities.  Patient is Right hand dominant.  She reports today chronic long-term history of bilateral hand pain and neck pain with more recent paresthesias in the hands in a somewhat nondermatomal fashion somewhat more radial digits.  She does get some referral up the arm into the shoulder.  She does get nocturnal complaints.  She does have somewhat of a positive flick sign history.  She does report some weakness in trouble manipulating objects.  She has not had prior electrodiagnostic study.  No history of diabetes.  She does carry diagnosis of borderline diabetes.  Hemoglobin A1c around 6-6.1 range.  Her case is complicated by history of lumbar spine issues managed by Dr. Gwendlyn Lemmings and Dr. Crecencio Dodge at [facility].  She does take gabapentin  and chronic oxycodone  but with no relief of her hand symptoms.  Oxycodone  10 mg 3 times per day which seems to be managed by someone in [place] but I am not sure if this is the [facility] or not.    I spent more than 30 minutes speaking face-to-face with the patient with 50% of the time in counseling and discussing coordination of care.       Review of Systems  Musculoskeletal:  Positive for back pain, joint pain and neck pain.  Neurological:  Positive for tingling and weakness.  All other systems reviewed and are negative.  Otherwise per HPI.  Assessment & Plan: Visit Diagnoses:    ICD-10-CM   1. Paresthesia of skin  R20.2 NCV with EMG  (electromyography)    2. Bilateral hand pain  M79.641    M79.642     3. Cervicalgia  M54.2     4. Weakness  R53.1        Plan: Impression: Robin Sherman does appear to have probably some level of carpal tunnel syndrome versus just musculoskeletal complaints of both hands with maybe osteoarthritis.  Secondary diagnosis to be cervical radiculopathy given her lumbar history.  Electrodiagnostic study performed today.  The above electrodiagnostic study is ABNORMAL and reveals evidence of a mild right median nerve entrapment at the wrist (carpal tunnel syndrome) affecting sensory components. There is no significant electrodiagnostic evidence of any other focal nerve entrapment, brachial plexopathy or cervical radiculopathy.   Recommendations: 1.  Follow-up with referring physician. 2.  Continue current management of symptoms. 3.  Continue use of resting splint at night-time and as needed during the day. 4.  Suggest surgical evaluation.  Meds & Orders: No orders of the defined types were placed in this encounter.   Orders Placed This Encounter  Procedures   NCV with EMG (electromyography)    Follow-up: Return for Elsa Halls, MD.   Procedures: No procedures performed  EMG & NCV Findings: Evaluation of the left median motor and the right median motor nerves showed reduced amplitude (L4.3, R4.8 mV).  The left median (across palm) sensory nerve showed no response (Palm).  The right median (across palm) sensory nerve showed prolonged distal peak latency (Wrist, 4.1 ms) and  prolonged distal peak latency (Palm, 7.3 ms).  All remaining nerves (as indicated in the following tables) were within normal limits.  Left vs. Right side comparison data for the median motor nerve indicates abnormal L-R latency difference (0.9 ms).  The ulnar sensory nerve indicates abnormal L-R amplitude difference (65.4 %).  All remaining left vs. right side differences were within normal limits.    All examined muscles (as  indicated in the following table) showed no evidence of electrical instability.    Impression: The above electrodiagnostic study is ABNORMAL and reveals evidence of a mild right median nerve entrapment at the wrist (carpal tunnel syndrome) affecting sensory components. There is no significant electrodiagnostic evidence of any other focal nerve entrapment, brachial plexopathy or cervical radiculopathy.   Recommendations: 1.  Follow-up with referring physician. 2.  Continue current management of symptoms. 3.  Continue use of resting splint at night-time and as needed during the day. 4.  Suggest surgical evaluation.  ___________________________ Collin Deal FAAPMR Board Certified, American Board of Physical Medicine and Rehabilitation    Nerve Conduction Studies Anti Sensory Summary Table   Stim Site NR Peak (ms) Norm Peak (ms) P-T Amp (V) Norm P-T Amp Site1 Site2 Delta-P (ms) Dist (cm) Vel (m/s) Norm Vel (m/s)  Left Median Acr Palm Anti Sensory (2nd Digit)  30.2C  Wrist    3.3 <3.6 23.1 >10 Wrist Palm  0.0    Palm *NR  <2.0          Right Median Acr Palm Anti Sensory (2nd Digit)  30.7C  Wrist    *4.1 <3.6 10.2 >10 Wrist Palm 3.2 0.0    Palm    *7.3 <2.0 7.2         Left Radial Anti Sensory (Base 1st Digit)  30.8C  Wrist    2.4 <3.1 25.6  Wrist Base 1st Digit 2.4 0.0    Right Radial Anti Sensory (Base 1st Digit)  30.7C  Wrist    2.5 <3.1 27.4  Wrist Base 1st Digit 2.5 0.0    Left Ulnar Anti Sensory (5th Digit)  31C  Wrist    3.3 <3.7 61.0 >15.0 Wrist 5th Digit 3.3 14.0 42 >38  Right Ulnar Anti Sensory (5th Digit)  31.2C  Wrist    3.3 <3.7 21.1 >15.0 Wrist 5th Digit 3.3 14.0 42 >38   Motor Summary Table   Stim Site NR Onset (ms) Norm Onset (ms) O-P Amp (mV) Norm O-P Amp Site1 Site2 Delta-0 (ms) Dist (cm) Vel (m/s) Norm Vel (m/s)  Left Median Motor (Abd Poll Brev)  31.2C  Wrist    3.2 <4.2 *4.3 >5 Elbow Wrist 4.2 21.0 50 >50  Elbow    7.4  7.1         Right Median Motor  (Abd Poll Brev)  30.8C  Wrist    4.1 <4.2 *4.8 >5 Elbow Wrist 4.2 21.0 50 >50  Elbow    8.3  4.8         Left Ulnar Motor (Abd Dig Min)  31.8C  Wrist    3.0 <4.2 9.5 >3 B Elbow Wrist 3.5 19.0 54 >53  B Elbow    6.5  8.9  A Elbow B Elbow 1.3 10.0 77 >53  A Elbow    7.8  8.1         Right Ulnar Motor (Abd Dig Min)  30.3C  Wrist    2.9 <4.2 9.5 >3 B Elbow Wrist 3.6 19.5 54 >53  B Elbow  6.5  9.1  A Elbow B Elbow 1.2 11.0 92 >53  A Elbow    7.7  8.6          EMG   Side Muscle Nerve Root Ins Act Fibs Psw Amp Dur Poly Recrt Int Deatra Face Comment  Right Abd Poll Brev Median C8-T1 Nml Nml Nml Nml Nml 0 Nml Nml   Right 1stDorInt Ulnar C8-T1 Nml Nml Nml Nml Nml 0 Nml Nml   Right PronatorTeres Median C6-7 Nml Nml Nml Nml Nml 0 Nml Nml   Right Biceps Musculocut C5-6 Nml Nml Nml Nml Nml 0 Nml Nml   Right Deltoid Axillary C5-6 Nml Nml Nml Nml Nml 0 Nml Nml     Nerve Conduction Studies Anti Sensory Left/Right Comparison   Stim Site L Lat (ms) R Lat (ms) L-R Lat (ms) L Amp (V) R Amp (V) L-R Amp (%) Site1 Site2 L Vel (m/s) R Vel (m/s) L-R Vel (m/s)  Median Acr Palm Anti Sensory (2nd Digit)  30.2C  Wrist 3.3 *4.1 0.8 23.1 10.2 55.8 Wrist Palm     Palm  *7.3   7.2        Radial Anti Sensory (Base 1st Digit)  30.8C  Wrist 2.4 2.5 0.1 25.6 27.4 6.6 Wrist Base 1st Digit     Ulnar Anti Sensory (5th Digit)  31C  Wrist 3.3 3.3 0.0 61.0 21.1 *65.4 Wrist 5th Digit 42 42 0   Motor Left/Right Comparison   Stim Site L Lat (ms) R Lat (ms) L-R Lat (ms) L Amp (mV) R Amp (mV) L-R Amp (%) Site1 Site2 L Vel (m/s) R Vel (m/s) L-R Vel (m/s)  Median Motor (Abd Poll Brev)  31.2C  Wrist 3.2 4.1 *0.9 *4.3 *4.8 10.4 Elbow Wrist 50 50 0  Elbow 7.4 8.3 0.9 7.1 4.8 32.4       Ulnar Motor (Abd Dig Min)  31.8C  Wrist 3.0 2.9 0.1 9.5 9.5 0.0 B Elbow Wrist 54 54 0  B Elbow 6.5 6.5 0.0 8.9 9.1 2.2 A Elbow B Elbow 77 92 15  A Elbow 7.8 7.7 0.1 8.1 8.6 5.8          Waveforms:                     Clinical  History: No specialty comments available.   She reports that she has been smoking cigarettes. She started smoking about 44 years ago. She has a 22.2 pack-year smoking history. She has never used smokeless tobacco. No results for input(s): "HGBA1C", "LABURIC" in the last 8760 hours.  Objective:  VS:  HT:    WT:   BMI:     BP:   HR: bpm  TEMP: ( )  RESP:  Physical Exam Vitals and nursing note reviewed.  Constitutional:      General: She is not in acute distress.    Appearance: Normal appearance. She is well-developed. She is obese. She is not ill-appearing.  HENT:     Head: Normocephalic and atraumatic.  Eyes:     Conjunctiva/sclera: Conjunctivae normal.     Pupils: Pupils are equal, round, and reactive to light.  Cardiovascular:     Rate and Rhythm: Normal rate.     Pulses: Normal pulses.  Pulmonary:     Effort: Pulmonary effort is normal.  Musculoskeletal:        General: Tenderness present. No swelling or deformity.     Right lower leg: No edema.     Left lower  leg: No edema.     Comments: Inspection reveals no atrophy of the bilateral APB or FDI or hand intrinsics. There is no swelling, color changes, allodynia or dystrophic changes. There is 5 out of 5 strength in the bilateral wrist extension, finger abduction and long finger flexion. There is intact sensation to light touch in all dermatomal and peripheral nerve distributions. There is a negative Froment's test bilaterally. There is a negative Tinel's test at the bilateral wrist and elbow. There is a equivocal Phalen's test bilaterally. There is a negative Hoffmann's test bilaterally.  Skin:    General: Skin is warm and dry.     Findings: No erythema or rash.  Neurological:     General: No focal deficit present.     Mental Status: She is alert and oriented to person, place, and time.     Cranial Nerves: No cranial nerve deficit.     Sensory: No sensory deficit.     Motor: No weakness or abnormal muscle tone.      Coordination: Coordination normal.     Gait: Gait abnormal.  Psychiatric:        Mood and Affect: Mood normal.        Behavior: Behavior normal.     Ortho Exam  Imaging: No results found.  Past Medical/Family/Surgical/Social History: Medications & Allergies reviewed per EMR, new medications updated. Patient Active Problem List   Diagnosis Date Noted   S/P TKR (total knee replacement), left 08/01/22 01/12/2023   Osteoarthritis of left knee 08/01/2022   Left knee pain 06/14/2022   S/P TKR (total knee replacement), right 03/29/2021   Isthmic spondylolisthesis 06/14/2020   Polypharmacy 02/09/2020   Encounter for screening colonoscopy 02/09/2020   DDD (degenerative disc disease), lumbar 12/08/2019   S/P right knee arthroscopy 12/12/18 *with chondroplasty patella  12/19/2018   Borderline diabetes 07/02/2015   Vitamin D  deficiency 07/02/2015   Lumbar back pain 07/02/2015   Insomnia 10/29/2013   Arthritis of knee, degenerative 10/13/2013   Hand dermatitis 03/02/2012   Essential hypertension, benign 09/21/2011   Class 2 obesity 09/21/2011   Shoulder pain, right 09/21/2011   Asthma, intermittent 09/21/2011   Tobacco user 09/21/2011   Past Medical History:  Diagnosis Date   Allergy    pollen   Arthritis    left knee   Asthma    Eczema    Followed by Dr. Del Favia dermatology   Encounter for screening colonoscopy 02/09/2020   Headache(784.0) 09/25/2012   Hypertension    Neuromuscular disorder (HCC)    Pre-diabetes    sciatica right side 10/13/2013   TRIGGER FINGER 11/03/2008   Qualifier: Diagnosis of  By: Phyllis Breeze MD, Arvel Lather     Family History  Problem Relation Age of Onset   Heart disease Mother        MI   Asthma Mother    Hypertension Mother    Stroke Father    Heart disease Father        MI   Hypertension Father    Cancer Sister        breast   Colon cancer Neg Hx    Past Surgical History:  Procedure Laterality Date   ABDOMINAL HYSTERECTOMY     bleeding    BILATERAL SALPINGECTOMY Bilateral 08/01/2013   Procedure: BILATERAL SALPINGECTOMY;  Surgeon: Albino Hum, MD;  Location: [facility];  Service: Gynecology;  Laterality: Bilateral;   BREAST BIOPSY Left 06/29/2015   FIBROADENOMA   CHONDROPLASTY Left 01/14/2014   Procedure: CHONDROPLASTY OF FEMUR;  Surgeon: Darrin Emerald, MD;  Location: [facility];  Service: Orthopedics;  Laterality: Left;   CHONDROPLASTY Right 12/12/2018   Procedure: CHONDROPLASTY;  Surgeon: Darrin Emerald, MD;  Location: [facility];  Service: Orthopedics;  Laterality: Right;   COLONOSCOPY N/A 01/27/2013   Dr. Nolene Baumgarten: Right colon with poor prep (patient ate [place] necks the night before the procedure).  Moderate diverticulosis in the sigmoid colon, moderate hemorrhoids.   COLONOSCOPY WITH PROPOFOL  N/A 04/06/2020   Procedure: COLONOSCOPY WITH PROPOFOL ;  Surgeon: Vinetta Greening, DO;  Location: [facility];  Service: Endoscopy;  Laterality: N/A;  8:15am   HEMATOMA EVACUATION N/A 08/03/2013   Procedure: EVACUATION PELVIC HEMATOMA;  Surgeon: Albino Hum, MD;  Location: [facility];  Service: Gynecology;  Laterality: N/A;   KNEE ARTHROSCOPY WITH MEDIAL MENISECTOMY Left 01/14/2014   Procedure: KNEE ARTHROSCOPY WITH PARTIAL MEDIAL MENISECTOMY;  Surgeon: Darrin Emerald, MD;  Location: [facility];  Service: Orthopedics;  Laterality: Left;   KNEE ARTHROSCOPY WITH MEDIAL MENISECTOMY Right 12/12/2018   Procedure: KNEE ARTHROSCOPY WITH MEDIAL MENISCECTOMY;  Surgeon: Darrin Emerald, MD;  Location: [facility];  Service: Orthopedics;  Laterality: Right;   POLYPECTOMY  04/06/2020   Procedure: POLYPECTOMY;  Surgeon: Vinetta Greening, DO;  [facility]: [facility];  Service: Endoscopy;;   SUPRACERVICAL ABDOMINAL HYSTERECTOMY N/A 08/01/2013   Procedure: HYSTERECTOMY SUPRACERVICAL ABDOMINAL;  Surgeon: Albino Hum, MD;  Location: [facility];  Service: Gynecology;  Laterality: N/A;   TOTAL KNEE ARTHROPLASTY Right 03/29/2021   Procedure: TOTAL  KNEE ARTHROPLASTY;  Surgeon: Darrin Emerald, MD;  Location: [facility];  Service: Orthopedics;  Laterality: Right;   TOTAL KNEE ARTHROPLASTY Left 08/01/2022   Procedure: TOTAL KNEE ARTHROPLASTY;  Surgeon: Darrin Emerald, MD;  Location: [facility];  Service: Orthopedics;  Laterality: Left;   TRANSFORAMINAL LUMBAR INTERBODY FUSION W/ MIS 1 LEVEL Right 06/14/2020   Procedure: Right Lumbar Five- Sacral One Minimally invasive transforaminal lumbar interbody fusion;  Surgeon: Cannon Champion, MD;  Location: [facility];  Service: Neurosurgery;  Laterality: Right;  Right Lumbar Five- Sacral One Minimally invasive transforaminal lumbar interbody fusion   TUBAL LIGATION     [facility]   Social History   Occupational History   Occupation: CNA  Tobacco Use   Smoking status: Every Day    Current packs/day: 0.50    Average packs/day: 0.5 packs/day for 44.3 years (22.2 ttl pk-yrs)    Types: Cigarettes    Start date: 07/18/1979   Smokeless tobacco: Never   Tobacco comments:    wants to quit  Vaping Use   Vaping status: Never Used  Substance and Sexual Activity   Alcohol use: Yes    Alcohol/week: 0.0 standard drinks of alcohol    Comment: occ   Drug use: No   Sexual activity: Yes    Birth control/protection: Surgical

## 2023-11-20 DIAGNOSIS — Z79891 Long term (current) use of opiate analgesic: Secondary | ICD-10-CM | POA: Diagnosis not present

## 2023-11-20 DIAGNOSIS — M5451 Vertebrogenic low back pain: Secondary | ICD-10-CM | POA: Diagnosis not present

## 2023-11-20 DIAGNOSIS — M5416 Radiculopathy, lumbar region: Secondary | ICD-10-CM | POA: Diagnosis not present

## 2023-11-20 DIAGNOSIS — G894 Chronic pain syndrome: Secondary | ICD-10-CM | POA: Diagnosis not present

## 2023-11-22 ENCOUNTER — Ambulatory Visit (HOSPITAL_COMMUNITY)
Admission: RE | Admit: 2023-11-22 | Discharge: 2023-11-22 | Disposition: A | Discharge status: Home / Self Care | Source: Ambulatory Visit | Attending: Nurse Practitioner | Admitting: Nurse Practitioner

## 2023-11-22 ENCOUNTER — Encounter (HOSPITAL_COMMUNITY): Payer: Self-pay

## 2023-11-22 DIAGNOSIS — N644 Mastodynia: Secondary | ICD-10-CM | POA: Diagnosis not present

## 2023-11-22 DIAGNOSIS — R92331 Mammographic heterogeneous density, right breast: Secondary | ICD-10-CM | POA: Diagnosis not present

## 2023-12-25 DIAGNOSIS — M5416 Radiculopathy, lumbar region: Secondary | ICD-10-CM | POA: Diagnosis not present

## 2023-12-25 DIAGNOSIS — G894 Chronic pain syndrome: Secondary | ICD-10-CM | POA: Diagnosis not present

## 2023-12-25 DIAGNOSIS — M5451 Vertebrogenic low back pain: Secondary | ICD-10-CM | POA: Diagnosis not present

## 2023-12-25 DIAGNOSIS — Z79891 Long term (current) use of opiate analgesic: Secondary | ICD-10-CM | POA: Diagnosis not present

## 2024-01-10 DIAGNOSIS — M50122 Cervical disc disorder at C5-C6 level with radiculopathy: Secondary | ICD-10-CM | POA: Diagnosis not present

## 2024-01-10 DIAGNOSIS — M4802 Spinal stenosis, cervical region: Secondary | ICD-10-CM | POA: Diagnosis not present

## 2024-01-11 DIAGNOSIS — M4802 Spinal stenosis, cervical region: Secondary | ICD-10-CM | POA: Insufficient documentation

## 2024-01-14 ENCOUNTER — Encounter: Payer: Self-pay | Admitting: Orthopedic Surgery

## 2024-01-14 ENCOUNTER — Ambulatory Visit: Admitting: Orthopedic Surgery

## 2024-01-14 DIAGNOSIS — G5601 Carpal tunnel syndrome, right upper limb: Secondary | ICD-10-CM | POA: Diagnosis not present

## 2024-01-14 NOTE — Progress Notes (Signed)
 Chief Complaint  Patient presents with   Results    Review nerve study right/ upper extremity     Encounter Diagnosis  Name Primary?   Carpal tunnel syndrome of right wrist Yes    Robin Sherman had a nerve conduction study that showed a mild carpal tunnel syndrome of the right upper extremity  We reviewed and discussed that today.  She also has cervical disc problem that may require surgery with Dr. Malcolm  I recommended that she not have the carpal tunnel release because the study showed mild disease however, Dr. Sisto nurse will contact her regarding his plan and then we will converse to determine if the carpal tunnel should be released prior to the next surgery

## 2024-01-14 NOTE — Progress Notes (Signed)
   LMP 04/15/2013 Comment: partial  There is no height or weight on file to calculate BMI.  Chief Complaint  Patient presents with   Results    Review nerve study right/ upper extremity     No diagnosis found.  DOI/DOS/ Date: ongoing right arm pain  Unchanged

## 2024-01-22 DIAGNOSIS — M5451 Vertebrogenic low back pain: Secondary | ICD-10-CM | POA: Diagnosis not present

## 2024-01-22 DIAGNOSIS — G894 Chronic pain syndrome: Secondary | ICD-10-CM | POA: Diagnosis not present

## 2024-01-22 DIAGNOSIS — Z79891 Long term (current) use of opiate analgesic: Secondary | ICD-10-CM | POA: Diagnosis not present

## 2024-01-22 DIAGNOSIS — M5416 Radiculopathy, lumbar region: Secondary | ICD-10-CM | POA: Diagnosis not present

## 2024-01-28 DIAGNOSIS — E1165 Type 2 diabetes mellitus with hyperglycemia: Secondary | ICD-10-CM | POA: Diagnosis not present

## 2024-02-05 DIAGNOSIS — Z111 Encounter for screening for respiratory tuberculosis: Secondary | ICD-10-CM | POA: Diagnosis not present

## 2024-02-25 DIAGNOSIS — G894 Chronic pain syndrome: Secondary | ICD-10-CM | POA: Diagnosis not present

## 2024-02-25 DIAGNOSIS — M5451 Vertebrogenic low back pain: Secondary | ICD-10-CM | POA: Diagnosis not present

## 2024-02-25 DIAGNOSIS — Z79891 Long term (current) use of opiate analgesic: Secondary | ICD-10-CM | POA: Diagnosis not present

## 2024-02-25 DIAGNOSIS — M5416 Radiculopathy, lumbar region: Secondary | ICD-10-CM | POA: Diagnosis not present

## 2024-03-25 DIAGNOSIS — M5451 Vertebrogenic low back pain: Secondary | ICD-10-CM | POA: Diagnosis not present

## 2024-03-25 DIAGNOSIS — M5416 Radiculopathy, lumbar region: Secondary | ICD-10-CM | POA: Diagnosis not present

## 2024-03-25 DIAGNOSIS — Z79891 Long term (current) use of opiate analgesic: Secondary | ICD-10-CM | POA: Diagnosis not present

## 2024-03-25 DIAGNOSIS — G894 Chronic pain syndrome: Secondary | ICD-10-CM | POA: Diagnosis not present

## 2024-04-03 ENCOUNTER — Other Ambulatory Visit (HOSPITAL_COMMUNITY): Payer: Self-pay | Admitting: Internal Medicine

## 2024-04-03 DIAGNOSIS — Z1231 Encounter for screening mammogram for malignant neoplasm of breast: Secondary | ICD-10-CM

## 2024-04-15 DIAGNOSIS — M5451 Vertebrogenic low back pain: Secondary | ICD-10-CM | POA: Diagnosis not present

## 2024-04-15 DIAGNOSIS — M5416 Radiculopathy, lumbar region: Secondary | ICD-10-CM | POA: Diagnosis not present

## 2024-04-15 DIAGNOSIS — Z79891 Long term (current) use of opiate analgesic: Secondary | ICD-10-CM | POA: Diagnosis not present

## 2024-04-15 DIAGNOSIS — G894 Chronic pain syndrome: Secondary | ICD-10-CM | POA: Diagnosis not present

## 2024-04-28 DIAGNOSIS — E782 Mixed hyperlipidemia: Secondary | ICD-10-CM | POA: Diagnosis not present

## 2024-04-28 DIAGNOSIS — E1165 Type 2 diabetes mellitus with hyperglycemia: Secondary | ICD-10-CM | POA: Diagnosis not present

## 2024-05-06 ENCOUNTER — Other Ambulatory Visit (HOSPITAL_COMMUNITY): Payer: Self-pay | Admitting: Nurse Practitioner

## 2024-05-06 DIAGNOSIS — Z122 Encounter for screening for malignant neoplasm of respiratory organs: Secondary | ICD-10-CM

## 2024-05-06 DIAGNOSIS — F17200 Nicotine dependence, unspecified, uncomplicated: Secondary | ICD-10-CM

## 2024-05-09 ENCOUNTER — Ambulatory Visit (HOSPITAL_COMMUNITY)
Admission: RE | Admit: 2024-05-09 | Discharge: 2024-05-09 | Disposition: A | Discharge status: Home / Self Care | Source: Ambulatory Visit | Attending: Internal Medicine | Admitting: Internal Medicine

## 2024-05-09 DIAGNOSIS — Z1231 Encounter for screening mammogram for malignant neoplasm of breast: Secondary | ICD-10-CM | POA: Diagnosis not present

## 2024-05-12 ENCOUNTER — Encounter: Payer: Self-pay | Admitting: Orthopedic Surgery

## 2024-05-12 ENCOUNTER — Other Ambulatory Visit (INDEPENDENT_AMBULATORY_CARE_PROVIDER_SITE_OTHER): Payer: Self-pay

## 2024-05-12 ENCOUNTER — Ambulatory Visit: Admitting: Orthopedic Surgery

## 2024-05-12 VITALS — BP 117/67 | Ht 64.0 in | Wt 234.0 lb

## 2024-05-12 DIAGNOSIS — M7052 Other bursitis of knee, left knee: Secondary | ICD-10-CM

## 2024-05-12 DIAGNOSIS — M25552 Pain in left hip: Secondary | ICD-10-CM

## 2024-05-12 DIAGNOSIS — S76912A Strain of unspecified muscles, fascia and tendons at thigh level, left thigh, initial encounter: Secondary | ICD-10-CM | POA: Diagnosis not present

## 2024-05-12 DIAGNOSIS — R200 Anesthesia of skin: Secondary | ICD-10-CM

## 2024-05-12 MED ORDER — METHYLPREDNISOLONE ACETATE 40 MG/ML IJ SUSP
40.0000 mg | Freq: Once | INTRAMUSCULAR | Status: AC
  Administered 2024-05-12: 40 mg via INTRA_ARTICULAR

## 2024-05-12 NOTE — Progress Notes (Signed)
    05/12/2024   Chief Complaint  Patient presents with   Leg Pain    Left lower leg painful / also painful anterior thigh, was massaging anterior thigh felt a pop was worried it was a blood clot / denies lumbar pain     Encounter Diagnoses  Name Primary?   Pain in left hip Yes   Pes anserinus bursitis of left knee    Numbness in left leg anterolateral lower leg    Muscle strain of left thigh, initial encounter     What pharmacy do you use ? ____[facility] _______________________  DOI/DOS/ Date: couple weeks   New symptoms of left thigh pain patient felt some pain in her thigh got down to do some exercises when she got up she felt a pop in the anterior thigh  She also has medial knee pain over the pes bursa she is status post left total knee in January 2024  She has some lateral anterior lower leg pain in the L5 distribution status post lumbar right L5-S1 transforaminal lumbar interbody fusion with bilateral L5-S1 pedicle screw placement in 2021  Examination shows normal range of motion of the left hip but pain with internal rotation and flexion  She has tenderness over the pes bursa   The lateral side of the lower leg is numb  The total knee incision looks normal  Flexion extension of the left knee is normal with no instability   We will try to inject the pes bursa and use local measures for the thigh  No treatment needed for the numbness on the lateral leg thought to be coming from her lumbar spine condition  Return as needed   Procedure note Left knee injection for bursitis   verbal consent was obtained to inject Left knee PES BURSA  Timeout was completed to confirm the site of injection  The medications used were 40 mg of Depo-Medrol  and 1% lidocaine  3 cc  Anesthesia was provided by ethyl chloride and the skin was prepped with alcohol.  After cleaning the skin with alcohol a 25-gauge needle was used to inject the left knee bursa   There were no  complications and a sterile bandage was applied

## 2024-05-12 NOTE — Addendum Note (Signed)
 Addended byBETHA JENEAN GREIG LELON on: 05/12/2024 10:24 AM   Modules accepted: Orders

## 2024-05-12 NOTE — Patient Instructions (Signed)
 Symptomatic treatment with local measures   Try ice over the knee area  Try heat over the thigh area

## 2024-05-13 DIAGNOSIS — M5451 Vertebrogenic low back pain: Secondary | ICD-10-CM | POA: Diagnosis not present

## 2024-05-13 DIAGNOSIS — M5416 Radiculopathy, lumbar region: Secondary | ICD-10-CM | POA: Diagnosis not present

## 2024-05-13 DIAGNOSIS — Z79891 Long term (current) use of opiate analgesic: Secondary | ICD-10-CM | POA: Diagnosis not present

## 2024-05-13 DIAGNOSIS — G894 Chronic pain syndrome: Secondary | ICD-10-CM | POA: Diagnosis not present

## 2024-05-18 ENCOUNTER — Ambulatory Visit (HOSPITAL_COMMUNITY): Admission: RE | Admit: 2024-05-18 | Source: Ambulatory Visit

## 2024-05-18 ENCOUNTER — Encounter (HOSPITAL_COMMUNITY): Payer: Self-pay

## 2024-05-19 ENCOUNTER — Encounter: Payer: Self-pay | Admitting: Radiology

## 2024-05-29 ENCOUNTER — Telehealth: Payer: Self-pay | Admitting: Nutrition

## 2024-05-29 ENCOUNTER — Encounter: Attending: Nurse Practitioner | Admitting: Nutrition

## 2024-05-29 NOTE — Telephone Encounter (Signed)
 VM left to call and r/s missed appointment.

## 2024-06-05 DIAGNOSIS — E669 Obesity, unspecified: Secondary | ICD-10-CM | POA: Diagnosis not present

## 2024-06-05 DIAGNOSIS — Z5320 Procedure and treatment not carried out because of patient's decision for unspecified reasons: Secondary | ICD-10-CM | POA: Diagnosis not present

## 2024-06-05 DIAGNOSIS — N898 Other specified noninflammatory disorders of vagina: Secondary | ICD-10-CM | POA: Diagnosis not present

## 2024-06-05 DIAGNOSIS — R87610 Atypical squamous cells of undetermined significance on cytologic smear of cervix (ASC-US): Secondary | ICD-10-CM | POA: Diagnosis not present

## 2024-06-05 DIAGNOSIS — R875 Abnormal microbiological findings in specimens from female genital organs: Secondary | ICD-10-CM | POA: Diagnosis not present

## 2024-06-05 DIAGNOSIS — Z6838 Body mass index (BMI) 38.0-38.9, adult: Secondary | ICD-10-CM | POA: Diagnosis not present

## 2024-06-05 DIAGNOSIS — E782 Mixed hyperlipidemia: Secondary | ICD-10-CM | POA: Diagnosis not present

## 2024-06-05 DIAGNOSIS — Z124 Encounter for screening for malignant neoplasm of cervix: Secondary | ICD-10-CM | POA: Diagnosis not present

## 2024-06-05 DIAGNOSIS — I10 Essential (primary) hypertension: Secondary | ICD-10-CM | POA: Diagnosis not present

## 2024-06-05 DIAGNOSIS — J45909 Unspecified asthma, uncomplicated: Secondary | ICD-10-CM | POA: Diagnosis not present

## 2024-06-05 DIAGNOSIS — E1165 Type 2 diabetes mellitus with hyperglycemia: Secondary | ICD-10-CM | POA: Diagnosis not present

## 2024-06-25 ENCOUNTER — Ambulatory Visit (HOSPITAL_COMMUNITY): Admission: RE | Admit: 2024-06-25 | Source: Ambulatory Visit

## 2024-06-25 ENCOUNTER — Encounter (HOSPITAL_COMMUNITY): Payer: Self-pay

## 2024-06-30 ENCOUNTER — Encounter: Admitting: Nutrition

## 2024-07-01 DIAGNOSIS — N390 Urinary tract infection, site not specified: Secondary | ICD-10-CM | POA: Diagnosis not present

## 2024-07-15 ENCOUNTER — Ambulatory Visit (HOSPITAL_COMMUNITY)
Admission: RE | Admit: 2024-07-15 | Discharge: 2024-07-15 | Disposition: A | Source: Ambulatory Visit | Attending: Nurse Practitioner | Admitting: Nurse Practitioner

## 2024-07-15 DIAGNOSIS — Z122 Encounter for screening for malignant neoplasm of respiratory organs: Secondary | ICD-10-CM | POA: Insufficient documentation

## 2024-07-15 DIAGNOSIS — F17200 Nicotine dependence, unspecified, uncomplicated: Secondary | ICD-10-CM | POA: Insufficient documentation

## 2024-07-24 ENCOUNTER — Encounter: Payer: Self-pay | Admitting: Nutrition

## 2024-07-24 ENCOUNTER — Encounter: Attending: Nurse Practitioner | Admitting: Nutrition

## 2024-07-24 VITALS — Ht 64.0 in | Wt 215.0 lb

## 2024-07-24 DIAGNOSIS — E1165 Type 2 diabetes mellitus with hyperglycemia: Secondary | ICD-10-CM | POA: Insufficient documentation

## 2024-07-24 NOTE — Patient Instructions (Signed)
 Goals  Eat three meals per day at times  Walk 30 minutes three times per week Quit smoking by Feb 1st. Cut out processed foods Focus on whole plant based foods- foods from a garden. Check blood sugars 2 times per day and record on sheet.

## 2024-07-24 NOTE — Progress Notes (Signed)
 Diabetes Self-Management Education  Visit Type:    Appt. Start Time: 0800 Appt. End Time: 0930  07/24/2024  [name], identified by name and date of birth, is a 64 y.o. female with a diagnosis of Diabetes: Type 2. Currently on Mounjaro.   .   ASSESSMENT Has lost 15-18 lbs since on Moujaro. Eats 1-2 meals per day. Snacks often.   Height 5' 4 (1.626 m), weight 215 lb (97.5 kg), last menstrual period 04/15/2013. Body mass index is 36.9 kg/m. Wt Readings from Last 3 Encounters:  07/24/24 215 lb (97.5 kg)  05/12/24 234 lb (106.1 kg)  10/22/23 234 lb (106.1 kg)   Ht Readings from Last 3 Encounters:  07/24/24 5' 4 (1.626 m)  05/12/24 5' 4 (1.626 m)  10/22/23 5' 4 (1.626 m)   Body mass index is 36.9 kg/m. @[facility] @ Facility age limit for growth %iles is 20 years. Facility age limit for growth %iles is 20 years. Past Medical History:  Diagnosis Date   Allergy    pollen   Arthritis    left knee   Asthma    Eczema    Followed by Dr. Shona dermatology   Encounter for screening colonoscopy 02/09/2020   Headache(784.0) 09/25/2012   Hypertension    Neuromuscular disorder (HCC)    Pre-diabetes    sciatica right side 10/13/2013   TRIGGER FINGER 11/03/2008   Qualifier: Diagnosis of  By: Margrette MD, Stanley     Medications Ordered Prior to Encounter[1]   Diabetes Self-Management Education - 07/24/24 0810       Health Coping   How would you rate your overall health? Good      Psychosocial Assessment   Patient Belief/Attitude about Diabetes Motivated to manage diabetes    What is the hardest part about your diabetes right now, causing you the most concern, or is the most worrisome to you about your diabetes?   Making healty food and beverage choices    Self-care barriers None    Self-management support Doctor's office    Other persons present Patient    Patient Concerns Nutrition/Meal planning;Healthy Lifestyle;Weight Control    Special Needs None    Preferred  Learning Style No preference indicated    Learning Readiness Ready    How often do you need to have someone help you when you read instructions, pamphlets, or other written materials from your doctor or pharmacy? 1 - Never    What is the last grade level you completed in school? 12      Pre-Education Assessment   Patient understands the diabetes disease and treatment process. Needs Instruction    Patient understands incorporating nutritional management into lifestyle. Needs Instruction    Patient undertands incorporating physical activity into lifestyle. Needs Instruction    Patient understands using medications safely. Needs Instruction    Patient understands monitoring blood glucose, interpreting and using results Needs Instruction    Patient understands prevention, detection, and treatment of acute complications. Needs Instruction    Patient understands prevention, detection, and treatment of chronic complications. Needs Instruction    Patient understands how to develop strategies to address psychosocial issues. Needs Instruction    Patient understands how to develop strategies to promote health/change behavior. Needs Instruction      Complications   Last HgB A1C per patient/outside source 6.5 %    How often do you check your blood sugar? 0 times/day (not testing)    Have you had a dilated eye exam in the past 12 months?  No    Have you had a dental exam in the past 12 months? No    Are you checking your feet? Yes      Dietary Intake   Breakfast skips    Lunch meat, vegetables or pack of nabs    Dinner whatever she fixes or picks up-    Snack (evening) ice cream    Beverage(s) water , 1 soda per day.      Activity / Exercise   Activity / Exercise Type ADL's      Patient Education   Previous Diabetes Education No    Disease Pathophysiology Definition of diabetes, type 1 and 2, and the diagnosis of diabetes;Factors that contribute to the development of diabetes;Explored patient's  options for treatment of their diabetes    Healthy Eating Role of diet in the treatment of diabetes and the relationship between the three main macronutrients and blood glucose level;Plate Method;Reviewed blood glucose goals for pre and post meals and how to evaluate the patients' food intake on their blood glucose level.;Information on hints to eating out and maintain blood glucose control.    Being Active Role of exercise on diabetes management, blood pressure control and cardiac health.;Helped patient identify appropriate exercises in relation to his/her diabetes, diabetes complications and other health issue.    Medications Reviewed patients medication for diabetes, action, purpose, timing of dose and side effects.    Chronic complications Relationship between chronic complications and blood glucose control;Lipid levels, blood glucose control and heart disease;Identified and discussed with patient  current chronic complications;Reviewed with patient heart disease, higher risk of, and prevention    Diabetes Stress and Support Identified and addressed patients feelings and concerns about diabetes;Worked with patient to identify barriers to care and solutions;Role of stress on diabetes    Lifestyle and Health Coping Lifestyle issues that need to be addressed for better diabetes care;Review risk of smoking and offered smoking cessation      Individualized Goals (developed by patient)   Nutrition Follow meal plan discussed;General guidelines for healthy choices and portions discussed    Physical Activity Exercise 3-5 times per week    Medications take my medication as prescribed    Monitoring  Test my blood glucose as discussed    Problem Solving Sleep Pattern;Eating Pattern;Addressing barriers to behavior change    Reducing Risk examine blood glucose patterns;stop smoking    Health Coping Ask for help with psychological, social, or emotional issues      Outcomes   Expected Outcomes Demonstrated  interest in learning. Expect positive outcomes    Future DMSE 4-6 wks    Program Status Not Completed          Individualized Plan for Diabetes Self-Management Training:   Learning Objective:  Patient will have a greater understanding of diabetes self-management. Patient education plan is to attend individual and/or group sessions per assessed needs and concerns.   Plan:   Patient Instructions  Goals  Eat three meals per day at times  Walk 30 minutes three times per week Quit smoking by Feb 1st. Cut out processed foods Focus on whole plant based foods- foods from a garden. Check blood sugars 2 times per day and record on sheet.   Expected Outcomes:  Demonstrated interest in learning. Expect positive outcomes  Education material provided: A1C conversion sheet, My Plate, and No sodium seasonings Lifestyle Medicine handouts If problems or questions, patient to contact team via:  Phone and Email  Future DSME appointment: 4-6 wks    [  1]  Current Outpatient Medications on File Prior to Visit  Medication Sig Dispense Refill   acyclovir ointment (ZOVIRAX) 5 % Apply topically.     albuterol  (VENTOLIN  HFA) 108 (90 Base) MCG/ACT inhaler Inhale 2 puffs into the lungs every 6 (six) hours as needed. (Patient not taking: Reported on 01/14/2024) 1 each 1   Ascorbic Acid  (VITAMIN C PO) Take 1 tablet by mouth daily.     atorvastatin (LIPITOR) 10 MG tablet Take 10 mg by mouth daily.     augmented betamethasone  dipropionate (DIPROLENE -AF) 0.05 % cream Apply topically 2 (two) times daily.     baclofen (LIORESAL) 10 MG tablet Take 10 mg by mouth 2 (two) times daily as needed.     buprenorphine (BUTRANS) 5 MCG/HR PTWK      Cholecalciferol  (VITAMIN D ) 50 MCG (2000 UT) tablet Take 2,000 Units by mouth daily.     desonide (DESOWEN) 0.05 % cream Apply topically 2 (two) times daily.     DULoxetine (CYMBALTA) 30 MG capsule Take 30 mg by mouth daily.     gabapentin  (NEURONTIN ) 100 MG capsule Take 1  capsule (100 mg total) by mouth 3 (three) times daily. (Patient not taking: Reported on 01/14/2024) 90 capsule 2   hydrOXYzine  (ATARAX /VISTARIL ) 25 MG tablet Take 1 tablet (25 mg total) by mouth 3 (three) times daily as needed. (Patient taking differently: Take 25-50 mg by mouth at bedtime as needed for anxiety.) 30 tablet 1   lisinopril -hydrochlorothiazide  (ZESTORETIC ) 20-12.5 MG tablet Take 1 tablet by mouth daily. 90 tablet 1   methocarbamol  (ROBAXIN ) 500 MG tablet TAKE 1 TABLET BY MOUTH THREE TIMES A DAY 90 tablet 1   MOUNJARO 5 MG/0.5ML Pen SMARTSIG:0.25 Milligram(s) SUB-Q Once a Week     Oxycodone  HCl 10 MG TABS Take 10 mg by mouth every 8 (eight) hours as needed.     No current facility-administered medications on file prior to visit.

## 2024-09-04 ENCOUNTER — Encounter: Admitting: Nutrition

## 2024-09-09 ENCOUNTER — Encounter: Admitting: Adult Health
